# Patient Record
Sex: Male | Born: 1955 | Race: White | Hispanic: No | Smoking: Former smoker
Health system: Southern US, Community
[De-identification: ages and names within clinical notes are randomized; demographics above are authoritative.]

## PROBLEM LIST (undated history)

## (undated) DIAGNOSIS — F1910 Other psychoactive substance abuse, uncomplicated: Secondary | ICD-10-CM

## (undated) DIAGNOSIS — J40 Bronchitis, not specified as acute or chronic: Secondary | ICD-10-CM

## (undated) DIAGNOSIS — C801 Malignant (primary) neoplasm, unspecified: Secondary | ICD-10-CM

## (undated) DIAGNOSIS — J449 Chronic obstructive pulmonary disease, unspecified: Secondary | ICD-10-CM

## (undated) DIAGNOSIS — R911 Solitary pulmonary nodule: Secondary | ICD-10-CM

## (undated) DIAGNOSIS — B009 Herpesviral infection, unspecified: Secondary | ICD-10-CM

## (undated) HISTORY — DX: Solitary pulmonary nodule: R91.1

## (undated) HISTORY — DX: Other psychoactive substance abuse, uncomplicated: F19.10

## (undated) HISTORY — DX: Bronchitis, not specified as acute or chronic: J40

## (undated) NOTE — *Deleted (*Deleted)
Mr. Bouldin presents for follow up of radiation completed 10/16/2018 to his head, neck, and larynx  Pain issues, if any: *** Using a feeding tube?: *** Weight changes, if any: *** Swallowing issues, if any: *** Smoking or chewing tobacco? *** Using fluoride trays daily? N/A Last ENT visit was on: 12/05/2019 Saw Dr. Newman Pies:   Scheduled to see him again in January 2022  Other notable issues, if any: ***  *vitals*

---

## 1997-09-30 ENCOUNTER — Emergency Department (HOSPITAL_COMMUNITY): Admission: EM | Admit: 1997-09-30 | Discharge: 1997-09-30 | Payer: Self-pay | Admitting: Emergency Medicine

## 1998-03-01 ENCOUNTER — Emergency Department (HOSPITAL_COMMUNITY): Admission: EM | Admit: 1998-03-01 | Discharge: 1998-03-01 | Payer: Self-pay | Admitting: Emergency Medicine

## 2018-03-23 DIAGNOSIS — J40 Bronchitis, not specified as acute or chronic: Secondary | ICD-10-CM

## 2018-03-23 HISTORY — DX: Bronchitis, not specified as acute or chronic: J40

## 2018-05-23 DIAGNOSIS — R911 Solitary pulmonary nodule: Secondary | ICD-10-CM

## 2018-05-23 HISTORY — DX: Solitary pulmonary nodule: R91.1

## 2018-06-14 ENCOUNTER — Ambulatory Visit (INDEPENDENT_AMBULATORY_CARE_PROVIDER_SITE_OTHER): Payer: BLUE CROSS/BLUE SHIELD

## 2018-06-14 ENCOUNTER — Encounter: Payer: Self-pay | Admitting: Family Medicine

## 2018-06-14 ENCOUNTER — Ambulatory Visit (INDEPENDENT_AMBULATORY_CARE_PROVIDER_SITE_OTHER): Payer: BLUE CROSS/BLUE SHIELD | Admitting: Family Medicine

## 2018-06-14 VITALS — BP 112/66 | HR 77 | Temp 97.7°F | Ht 72.0 in | Wt 150.0 lb

## 2018-06-14 DIAGNOSIS — Z13220 Encounter for screening for lipoid disorders: Secondary | ICD-10-CM

## 2018-06-14 DIAGNOSIS — R5383 Other fatigue: Secondary | ICD-10-CM | POA: Diagnosis not present

## 2018-06-14 DIAGNOSIS — Z125 Encounter for screening for malignant neoplasm of prostate: Secondary | ICD-10-CM | POA: Diagnosis not present

## 2018-06-14 DIAGNOSIS — R0600 Dyspnea, unspecified: Secondary | ICD-10-CM

## 2018-06-14 DIAGNOSIS — Z122 Encounter for screening for malignant neoplasm of respiratory organs: Secondary | ICD-10-CM

## 2018-06-14 DIAGNOSIS — J441 Chronic obstructive pulmonary disease with (acute) exacerbation: Secondary | ICD-10-CM | POA: Diagnosis not present

## 2018-06-14 MED ORDER — BETAMETHASONE SOD PHOS & ACET 6 (3-3) MG/ML IJ SUSP
6.0000 mg | Freq: Once | INTRAMUSCULAR | Status: AC
  Administered 2018-06-14: 6 mg via INTRAMUSCULAR

## 2018-06-14 MED ORDER — MOXIFLOXACIN HCL 400 MG PO TABS: 400.0000 mg | ORAL_TABLET | Freq: Every day | ORAL | 0 refills | Status: DC

## 2018-06-14 MED ORDER — VARENICLINE TARTRATE 0.5 MG X 11 & 1 MG X 42 PO MISC: ORAL | 0 refills | Status: DC

## 2018-06-14 NOTE — Progress Notes (Signed)
Subjective:  Patient ID: Ronald Ruiz, male    DOB: Feb 08, 1956  Age: 63 y.o. MRN: [id number]  CC: New Patient (Initial Visit)   HPI Abelardo Seidner presents for new patient visit to establish himself as a patient of this practice.  Of note is that he has recently gone to urgent care for shortness of breath.  He was put on some antibiotics for 2 courses.  Nothing seems to have resolved this problem.  He is still coughing and congested.  He is also wheezing and short of breath.  Patient is a smoker with 75-100-pack-year history.  He has never had a CT for lung cancer prevention.  He is interested in stopping smoking.  Patient admits to a history of use of opiates.  He says he just like him.  He had been on pain meds for about 22 years.  He goes to the methadone clinic at [facility].  He takes 110 mg of the liquid more methadone daily.  Depression screen PHQ 2/9 06/14/2018  Decreased Interest 3  Down, Depressed, Hopeless 2  PHQ - 2 Score 5  Altered sleeping 2  Tired, decreased energy 2  Change in appetite 3  Feeling bad or failure about yourself  0  Trouble concentrating 0  Moving slowly or fidgety/restless 0  Suicidal thoughts 1  PHQ-9 Score 13    History Chisom has a past medical history of Substance abuse ([place]).   He has no past surgical history on file.   His family history includes Alcohol abuse in his father; Cancer in his father; Drug abuse in his brother.He reports that he has been smoking cigarettes. He has a 100.00 pack-year smoking history. He has never used smokeless tobacco. He reports previous alcohol use. He reports previous drug use.    ROS Review of Systems  Constitutional: Positive for activity change (Decreased due to shortness of breath) and fatigue. Negative for chills, diaphoresis and fever.  HENT: Negative.   Eyes: Negative for visual disturbance.  Respiratory: Positive for cough and shortness of breath.   Cardiovascular: Negative for chest pain and leg  swelling.  Gastrointestinal: Negative for abdominal pain, diarrhea, nausea and vomiting.  Genitourinary: Negative for difficulty urinating.  Musculoskeletal: Negative for arthralgias and myalgias.  Skin: Negative for rash.  Neurological: Negative for headaches.  Psychiatric/Behavioral: Negative for sleep disturbance.    Objective:  BP 112/66   Pulse 77   Temp 97.7 F (36.5 C) (Oral)   Ht 6' (1.829 m)   Wt 150 lb (68 kg)   BMI 20.34 kg/m   BP Readings from Last 3 Encounters:  06/14/18 112/66    Wt Readings from Last 3 Encounters:  06/14/18 150 lb (68 kg)     Physical Exam    Assessment & Plan:   Khizar was seen today for new patient (initial visit).  Diagnoses and all orders for this visit:  Dyspnea, unspecified type -     DG Chest 2 View; Future -     betamethasone acetate-betamethasone sodium phosphate (CELESTONE) injection 6 mg -     CBC with Differential/Platelet -     CMP14+EGFR  Screening for cholesterol level -     Lipid panel  Special screening for malignant neoplasm of prostate -     PSA, total and free  Other fatigue -     TSH  Chronic obstructive pulmonary disease with acute exacerbation ([place]) -     Ambulatory referral to Pulmonology  Encounter for screening for malignant neoplasm of  respiratory organs -     CT CHEST LUNG CA SCREEN LOW DOSE W/O CM; Future  Other orders -     moxifloxacin (AVELOX) 400 MG tablet; Take 1 tablet (400 mg total) by mouth daily. Take all of these, for infection -     varenicline (CHANTIX STARTING MONTH PAK) 0.5 MG X 11 & 1 MG X 42 tablet; Use according to package directions       I am having Braxley Balandran "Tim" start on moxifloxacin and varenicline. I am also having him maintain his albuterol, budesonide-formoterol, sulfamethoxazole-trimethoprim, and methadone. We administered betamethasone acetate-betamethasone sodium phosphate.  Allergies as of 06/14/2018   No Known Allergies     Medication List        Accurate as of June 14, 2018 11:59 PM. Always use your most recent med list.        albuterol 108 (90 Base) MCG/ACT inhaler Commonly known as:  PROVENTIL HFA;VENTOLIN HFA Inhale into the lungs.   methadone 10 MG/ML solution Commonly known as:  DOLOPHINE Take 110 mg by mouth daily.   moxifloxacin 400 MG tablet Commonly known as:  AVELOX Take 1 tablet (400 mg total) by mouth daily. Take all of these, for infection   sulfamethoxazole-trimethoprim 800-160 MG tablet Commonly known as:  BACTRIM DS,SEPTRA DS   SYMBICORT 80-4.5 MCG/ACT inhaler Generic drug:  budesonide-formoterol Inhale into the lungs.   varenicline 0.5 MG X 11 & 1 MG X 42 tablet Commonly known as:  CHANTIX STARTING MONTH PAK Use according to package directions        Follow-up: Return in about 2 weeks (around 06/28/2018).  [name], M.D.

## 2018-06-15 LAB — CBC WITH DIFFERENTIAL/PLATELET
Basophils Absolute: 0.1 10*3/uL (ref 0.0–0.2)
Basos: 1 %
EOS (ABSOLUTE): 0.4 10*3/uL (ref 0.0–0.4)
Eos: 4 %
HEMOGLOBIN: 14.5 g/dL (ref 13.0–17.7)
Hematocrit: 42.6 % (ref 37.5–51.0)
Immature Grans (Abs): 0 10*3/uL (ref 0.0–0.1)
Immature Granulocytes: 0 %
Lymphocytes Absolute: 2.6 10*3/uL (ref 0.7–3.1)
Lymphs: 29 %
MCH: 31.7 pg (ref 26.6–33.0)
MCHC: 34 g/dL (ref 31.5–35.7)
MCV: 93 fL (ref 79–97)
Monocytes Absolute: 0.7 10*3/uL (ref 0.1–0.9)
Monocytes: 8 %
Neutrophils Absolute: 5 10*3/uL (ref 1.4–7.0)
Neutrophils: 58 %
Platelets: 188 10*3/uL (ref 150–450)
RBC: 4.58 x10E6/uL (ref 4.14–5.80)
RDW: 13.6 % (ref 11.6–15.4)
WBC: 8.8 10*3/uL (ref 3.4–10.8)

## 2018-06-15 LAB — CMP14+EGFR
ALT: 16 IU/L (ref 0–44)
AST: 20 IU/L (ref 0–40)
Albumin/Globulin Ratio: 1.4 (ref 1.2–2.2)
Albumin: 4.2 g/dL (ref 3.8–4.8)
Alkaline Phosphatase: 67 IU/L (ref 39–117)
BILIRUBIN TOTAL: 0.2 mg/dL (ref 0.0–1.2)
BUN/Creatinine Ratio: 20 (ref 10–24)
BUN: 22 mg/dL (ref 8–27)
CHLORIDE: 97 mmol/L (ref 96–106)
CO2: 24 mmol/L (ref 20–29)
Calcium: 9.5 mg/dL (ref 8.6–10.2)
Creatinine, Ser: 1.11 mg/dL (ref 0.76–1.27)
GFR calc Af Amer: 82 mL/min/{1.73_m2} (ref >59)
GFR calc non Af Amer: 71 mL/min/{1.73_m2} (ref >59)
Globulin, Total: 3.1 g/dL (ref 1.5–4.5)
Glucose: 86 mg/dL (ref 65–99)
POTASSIUM: 4.7 mmol/L (ref 3.5–5.2)
Sodium: 139 mmol/L (ref 134–144)
Total Protein: 7.3 g/dL (ref 6.0–8.5)

## 2018-06-15 LAB — LIPID PANEL
Chol/HDL Ratio: 3.8 ratio (ref 0.0–5.0)
Cholesterol, Total: 173 mg/dL (ref 100–199)
HDL: 46 mg/dL (ref >39)
LDL Calculated: 102 mg/dL — ABNORMAL HIGH (ref 0–99)
Triglycerides: 123 mg/dL (ref 0–149)
VLDL Cholesterol Cal: 25 mg/dL (ref 5–40)

## 2018-06-15 LAB — TSH: TSH: 1.68 u[IU]/mL (ref 0.450–4.500)

## 2018-06-15 LAB — PSA, TOTAL AND FREE
PSA, Free Pct: 40 %
PSA, Free: 0.04 ng/mL
Prostate Specific Ag, Serum: 0.1 ng/mL (ref 0.0–4.0)

## 2018-06-18 NOTE — Progress Notes (Signed)
Hello Ronald Ruiz,  Your lab result is normal.Some minor variations that are not significant are commonly marked abnormal, but do not represent any medical problem for you.  Best regards, [name], M.D.

## 2018-06-22 ENCOUNTER — Encounter: Payer: Self-pay | Admitting: Family Medicine

## 2018-06-27 ENCOUNTER — Ambulatory Visit: Payer: BLUE CROSS/BLUE SHIELD | Admitting: Family Medicine

## 2018-07-02 ENCOUNTER — Other Ambulatory Visit: Payer: Self-pay | Admitting: Family Medicine

## 2018-07-02 MED ORDER — BUDESONIDE-FORMOTEROL FUMARATE 80-4.5 MCG/ACT IN AERO: 2.0000 | INHALATION_SPRAY | Freq: Every day | RESPIRATORY_TRACT | 0 refills | Status: DC

## 2018-07-02 NOTE — Telephone Encounter (Signed)
Refill sent to pharmacy.   

## 2018-07-03 ENCOUNTER — Ambulatory Visit (HOSPITAL_COMMUNITY)
Admission: RE | Admit: 2018-07-03 | Discharge: 2018-07-03 | Disposition: A | Discharge status: Home / Self Care | Payer: BLUE CROSS/BLUE SHIELD | Source: Ambulatory Visit | Attending: Family Medicine | Admitting: Family Medicine

## 2018-07-03 DIAGNOSIS — Z122 Encounter for screening for malignant neoplasm of respiratory organs: Secondary | ICD-10-CM | POA: Diagnosis present

## 2018-07-04 ENCOUNTER — Ambulatory Visit: Payer: BLUE CROSS/BLUE SHIELD | Admitting: Family Medicine

## 2018-07-06 ENCOUNTER — Institutional Professional Consult (permissible substitution): Payer: Self-pay | Admitting: Pulmonary Disease

## 2018-07-06 ENCOUNTER — Telehealth: Payer: Self-pay

## 2018-07-06 ENCOUNTER — Encounter: Payer: Self-pay | Admitting: Pulmonary Disease

## 2018-07-06 ENCOUNTER — Ambulatory Visit (INDEPENDENT_AMBULATORY_CARE_PROVIDER_SITE_OTHER): Payer: BLUE CROSS/BLUE SHIELD | Admitting: Pulmonary Disease

## 2018-07-06 DIAGNOSIS — J441 Chronic obstructive pulmonary disease with (acute) exacerbation: Secondary | ICD-10-CM | POA: Diagnosis not present

## 2018-07-06 LAB — CBC WITH DIFFERENTIAL/PLATELET
Basophils Absolute: 0 10*3/uL (ref 0.0–0.1)
Basophils Relative: 0.6 % (ref 0.0–3.0)
EOS PCT: 3.5 % (ref 0.0–5.0)
Eosinophils Absolute: 0.2 10*3/uL (ref 0.0–0.7)
HCT: 40.8 % (ref 39.0–52.0)
HEMOGLOBIN: 13.6 g/dL (ref 13.0–17.0)
Lymphocytes Relative: 34.5 % (ref 12.0–46.0)
Lymphs Abs: 2.3 10*3/uL (ref 0.7–4.0)
MCHC: 33.4 g/dL (ref 30.0–36.0)
MCV: 95.8 fl (ref 78.0–100.0)
MONOS PCT: 8.3 % (ref 3.0–12.0)
Monocytes Absolute: 0.6 10*3/uL (ref 0.1–1.0)
Neutro Abs: 3.6 10*3/uL (ref 1.4–7.7)
Neutrophils Relative %: 53.1 % (ref 43.0–77.0)
Platelets: 180 10*3/uL (ref 150.0–400.0)
RBC: 4.26 Mil/uL (ref 4.22–5.81)
RDW: 14.7 % (ref 11.5–15.5)
WBC: 6.7 10*3/uL (ref 4.0–10.5)

## 2018-07-06 LAB — NITRIC OXIDE: FeNO level (ppb): 11

## 2018-07-06 MED ORDER — TIOTROPIUM BROMIDE MONOHYDRATE 2.5 MCG/ACT IN AERS: 2.0000 | INHALATION_SPRAY | Freq: Every day | RESPIRATORY_TRACT | 2 refills | Status: DC

## 2018-07-06 NOTE — Patient Instructions (Signed)
Continue the Symbicort and albuterol We will add Spiriva 2.5 Use Mucinex over-the-counter for chest congestion We will schedule you for pulmonary function test Check CBC differential, IgE, alpha-1 antitrypsin levels and phenotype  Schedule PET scan Follow-up in 2 weeks.

## 2018-07-06 NOTE — Telephone Encounter (Signed)
Spoke with woman in CT-she said the person who does that is out until 07/09/18. Will leave message to call to iturn CT scan into Super D.  She said the machine does not have it on there but she saves the scans.

## 2018-07-06 NOTE — Progress Notes (Signed)
Ronald Ruiz    [id number]    Sep 28, 1955  Primary Care Physician:Stacks, Cletus Gash, MD  Referring Physician: [name], MD [place], [place] [id number]  Chief complaint: Consult for bronchitis, lung nodule  HPI: 63 year old active heavy smoker with history of substance abuse, on methadone. Has recurrent attacks of bronchitis since November 2019.  Treated with multiple rounds of antibiotics and prednisone. Still has chronic cough with white mucus, dyspnea on exertion, chest congestion with wheezing.  Denies any fevers, chills He had been started on Symbicort a few months ago but cannot tell if it is helping  CT scan obtained earlier this week shows evidence of emphysema, fine micro nodularity and 2 lower nodules  Pets: Has a cat, no dogs, birds, farm animals Occupation: Works as a Insurance claims handler for [employer]: No known exposures, no mold, hot tub, Jacuzzi Smoking history: 40-pack-year smoker.  Continues to smoke a pack a day Travel history: No significant travel history Relevant family history: No significant family history of lung disease  Outpatient Encounter Medications as of 07/06/2018  Medication Sig  . budesonide-formoterol (SYMBICORT) 80-4.5 MCG/ACT inhaler Inhale 2 puffs into the lungs daily for 30 days.  . methadone (DOLOPHINE) 10 MG/ML solution Take 110 mg by mouth daily.  . varenicline (CHANTIX STARTING MONTH PAK) 0.5 MG X 11 & 1 MG X 42 tablet Use according to package directions  . [DISCONTINUED] moxifloxacin (AVELOX) 400 MG tablet Take 1 tablet (400 mg total) by mouth daily. Take all of these, for infection  . [DISCONTINUED] sulfamethoxazole-trimethoprim (BACTRIM DS,SEPTRA DS) 800-160 MG tablet   . albuterol (PROVENTIL HFA;VENTOLIN HFA) 108 (90 Base) MCG/ACT inhaler Inhale into the lungs.   No facility-administered encounter medications on file as of 07/06/2018.     Allergies as of 07/06/2018  . (No Known Allergies)    Past  Medical History:  Diagnosis Date  . Bronchitis 03/2018  . Lung nodule 2020  . Substance abuse ([place])    opiate addiction, been on methadone for 11 years    History reviewed. No pertinent surgical history.  Family History  Problem Relation Age of Onset  . Alcohol abuse Father   . Cancer Father   . Cirrhosis Father   . Drug abuse Brother     Social History   Socioeconomic History  . Marital status: Widowed    Spouse name: Not on file  . Number of children: Not on file  . Years of education: Not on file  . Highest education level: Not on file  Occupational History  . Not on file  Social Needs  . Financial resource strain: Not on file  . Food insecurity:    Worry: Not on file    Inability: Not on file  . Transportation needs:    Medical: Not on file    Non-medical: Not on file  Tobacco Use  . Smoking status: Current Every Day Smoker    Packs/day: 2.00    Years: 50.00    Pack years: 100.00    Types: Cigarettes  . Smokeless tobacco: Never Used  . Tobacco comment: 07/06/18 at 10 cigs per day  Substance and Sexual Activity  . Alcohol use: Not Currently  . Drug use: Not Currently  . Sexual activity: Not Currently    Birth control/protection: None  Lifestyle  . Physical activity:    Days per week: Not on file    Minutes per session: Not on file  . Stress: Not  on file  Relationships  . Social connections:    Talks on phone: Not on file    Gets together: Not on file    Attends religious service: Not on file    Active member of club or organization: Not on file    Attends meetings of clubs or organizations: Not on file    Relationship status: Not on file  . Intimate partner violence:    Fear of current or ex partner: Not on file    Emotionally abused: Not on file    Physically abused: Not on file    Forced sexual activity: Not on file  Other Topics Concern  . Not on file  Social History Narrative  . Not on file    Review of systems: Review of Systems    Constitutional: Negative for fever and chills.  HENT: Negative.   Eyes: Negative for blurred vision.  Respiratory: as per HPI  Cardiovascular: Negative for chest pain and palpitations.  Gastrointestinal: Negative for vomiting, diarrhea, blood per rectum. Genitourinary: Negative for dysuria, urgency, frequency and hematuria.  Musculoskeletal: Negative for myalgias, back pain and joint pain.  Skin: Negative for itching and rash.  Neurological: Negative for dizziness, tremors, focal weakness, seizures and loss of consciousness.  Endo/Heme/Allergies: Negative for environmental allergies.  Psychiatric/Behavioral: Negative for depression, suicidal ideas and hallucinations.  All other systems reviewed and are negative.  Physical Exam: Blood pressure 106/60, pulse 72, height 6' (1.829 m), weight 153 lb 3.2 oz (69.5 kg), SpO2 98 %. Gen:      No acute distress HEENT:  EOMI, sclera anicteric Neck:     No masses; no thyromegaly Lungs:    Clear to auscultation bilaterally; normal respiratory effort CV:         Regular rate and rhythm; no murmurs Abd:      + bowel sounds; soft, non-tender; no palpable masses, no distension Ext:    No edema; adequate peripheral perfusion Skin:      Warm and dry; no rash Neuro: alert and oriented x 3 Psych: normal mood and affect  Data Reviewed: Imaging: CT chest 07/03/2018-mild centrilobular emphysema, fine nodularity in the lower lobes, middle lobe and lingula.  1.1 irregular elongated nodule in the left lower lobe, 1 cm pulmonary nodule in the right middle lobe.  I have reviewed the images personally.  FENO 07/06/2018-11  Assessment:  Chronic bronchitis, emphysema Likely has COPD based on his presentation and smoking history He is currently on Symbicort and albuterol.  I will add Spiriva as he continues to be symptomatic Schedule pulmonary function tests Check CBC differential, IgE, alpha-1 antitrypsin levels and phenotype for baseline evaluation  Abnormal  CT scan Has fine micronodularity suggestive of smoking-related changes in addition to 2 lung nodules measuring 1 cm The right middle lobe nodule is concerning for malignancy given its appearance.  Its presence just above the diaphragm will make it difficult to approach with either CT-guided biopsy or navigational biopsy.  We will call for super D CT  We will get a PET scan for further evaluation.  Based on PFTs we may need to consider going for surgical resection I will discuss the case with my colleagues regarding the best approach for diagnosis.  Plan/Recommendations: - Continue Symbicort, albuterol - Start Spiriva - Check CBC, IgE, alpha-1 antitrypsin - PET scan  Marshell Garfinkel MD [facility] 07/06/2018, 10:19 AM  CC: [name], MD

## 2018-07-12 MED ORDER — TIOTROPIUM BROMIDE MONOHYDRATE 2.5 MCG/ACT IN AERS: 2.0000 | INHALATION_SPRAY | Freq: Every day | RESPIRATORY_TRACT | 2 refills | Status: DC

## 2018-07-12 NOTE — Addendum Note (Signed)
Addended by: Karmen Stabs on: 07/12/2018 11:42 AM   Modules accepted: Orders

## 2018-07-13 LAB — ALPHA-1 ANTITRYPSIN PHENOTYPE: A-1 Antitrypsin, Ser: 199 mg/dL (ref 83–199)

## 2018-07-13 LAB — IGE: IgE (Immunoglobulin E), Serum: 106 kU/L (ref <=114)

## 2018-07-17 ENCOUNTER — Other Ambulatory Visit: Payer: Self-pay | Admitting: Pulmonary Disease

## 2018-07-17 ENCOUNTER — Ambulatory Visit (HOSPITAL_COMMUNITY)
Admission: RE | Admit: 2018-07-17 | Discharge: 2018-07-17 | Disposition: A | Discharge status: Home / Self Care | Payer: BLUE CROSS/BLUE SHIELD | Source: Ambulatory Visit | Attending: Pulmonary Disease | Admitting: Pulmonary Disease

## 2018-07-17 DIAGNOSIS — R911 Solitary pulmonary nodule: Secondary | ICD-10-CM

## 2018-07-17 DIAGNOSIS — J441 Chronic obstructive pulmonary disease with (acute) exacerbation: Secondary | ICD-10-CM | POA: Diagnosis present

## 2018-07-17 DIAGNOSIS — J383 Other diseases of vocal cords: Secondary | ICD-10-CM

## 2018-07-17 LAB — GLUCOSE, CAPILLARY: Glucose-Capillary: 91 mg/dL (ref 70–99)

## 2018-07-17 MED ORDER — FLUDEOXYGLUCOSE F - 18 (FDG) INJECTION
7.7200 | Freq: Once | INTRAVENOUS | Status: AC | PRN
  Administered 2018-07-17: 7.72 via INTRAVENOUS

## 2018-07-20 ENCOUNTER — Other Ambulatory Visit: Payer: Self-pay | Admitting: Otolaryngology

## 2018-07-21 ENCOUNTER — Inpatient Hospital Stay (HOSPITAL_COMMUNITY)
Admission: EM | Admit: 2018-07-21 | Discharge: 2018-08-03 | DRG: 011 | Disposition: A | Discharge status: Home Health | Payer: BLUE CROSS/BLUE SHIELD | Source: Other Acute Inpatient Hospital | Attending: Internal Medicine | Admitting: Internal Medicine

## 2018-07-21 ENCOUNTER — Encounter (HOSPITAL_COMMUNITY)
Admission: EM | Disposition: A | Discharge status: Home Health | Payer: Self-pay | Source: Other Acute Inpatient Hospital | Attending: Internal Medicine

## 2018-07-21 ENCOUNTER — Emergency Department (HOSPITAL_COMMUNITY): Payer: BLUE CROSS/BLUE SHIELD | Admitting: Certified Registered"

## 2018-07-21 ENCOUNTER — Encounter (HOSPITAL_COMMUNITY): Payer: Self-pay

## 2018-07-21 ENCOUNTER — Other Ambulatory Visit: Payer: Self-pay

## 2018-07-21 ENCOUNTER — Emergency Department (HOSPITAL_COMMUNITY): Payer: BLUE CROSS/BLUE SHIELD

## 2018-07-21 DIAGNOSIS — J387 Other diseases of larynx: Secondary | ICD-10-CM | POA: Diagnosis not present

## 2018-07-21 DIAGNOSIS — R1319 Other dysphagia: Secondary | ICD-10-CM | POA: Diagnosis not present

## 2018-07-21 DIAGNOSIS — Z809 Family history of malignant neoplasm, unspecified: Secondary | ICD-10-CM

## 2018-07-21 DIAGNOSIS — F1120 Opioid dependence, uncomplicated: Secondary | ICD-10-CM | POA: Diagnosis present

## 2018-07-21 DIAGNOSIS — I700 Atherosclerosis of aorta: Secondary | ICD-10-CM | POA: Diagnosis not present

## 2018-07-21 DIAGNOSIS — Z681 Body mass index (BMI) 19 or less, adult: Secondary | ICD-10-CM

## 2018-07-21 DIAGNOSIS — R402252 Coma scale, best verbal response, oriented, at arrival to emergency department: Secondary | ICD-10-CM | POA: Diagnosis present

## 2018-07-21 DIAGNOSIS — R402362 Coma scale, best motor response, obeys commands, at arrival to emergency department: Secondary | ICD-10-CM | POA: Diagnosis present

## 2018-07-21 DIAGNOSIS — Z813 Family history of other psychoactive substance abuse and dependence: Secondary | ICD-10-CM

## 2018-07-21 DIAGNOSIS — E43 Unspecified severe protein-calorie malnutrition: Secondary | ICD-10-CM | POA: Diagnosis present

## 2018-07-21 DIAGNOSIS — Z811 Family history of alcohol abuse and dependence: Secondary | ICD-10-CM | POA: Diagnosis not present

## 2018-07-21 DIAGNOSIS — R1310 Dysphagia, unspecified: Secondary | ICD-10-CM | POA: Diagnosis present

## 2018-07-21 DIAGNOSIS — J439 Emphysema, unspecified: Secondary | ICD-10-CM | POA: Diagnosis not present

## 2018-07-21 DIAGNOSIS — F17200 Nicotine dependence, unspecified, uncomplicated: Secondary | ICD-10-CM | POA: Diagnosis not present

## 2018-07-21 DIAGNOSIS — C320 Malignant neoplasm of glottis: Principal | ICD-10-CM | POA: Diagnosis present

## 2018-07-21 DIAGNOSIS — J449 Chronic obstructive pulmonary disease, unspecified: Secondary | ICD-10-CM | POA: Diagnosis present

## 2018-07-21 DIAGNOSIS — R402142 Coma scale, eyes open, spontaneous, at arrival to emergency department: Secondary | ICD-10-CM | POA: Diagnosis present

## 2018-07-21 DIAGNOSIS — Z7951 Long term (current) use of inhaled steroids: Secondary | ICD-10-CM

## 2018-07-21 DIAGNOSIS — F1910 Other psychoactive substance abuse, uncomplicated: Secondary | ICD-10-CM | POA: Diagnosis not present

## 2018-07-21 DIAGNOSIS — R0600 Dyspnea, unspecified: Secondary | ICD-10-CM

## 2018-07-21 DIAGNOSIS — J385 Laryngeal spasm: Secondary | ICD-10-CM | POA: Diagnosis present

## 2018-07-21 DIAGNOSIS — E162 Hypoglycemia, unspecified: Secondary | ICD-10-CM | POA: Diagnosis not present

## 2018-07-21 DIAGNOSIS — F1920 Other psychoactive substance dependence, uncomplicated: Secondary | ICD-10-CM | POA: Diagnosis not present

## 2018-07-21 DIAGNOSIS — Z930 Tracheostomy status: Secondary | ICD-10-CM | POA: Diagnosis not present

## 2018-07-21 DIAGNOSIS — J383 Other diseases of vocal cords: Secondary | ICD-10-CM | POA: Diagnosis not present

## 2018-07-21 DIAGNOSIS — D696 Thrombocytopenia, unspecified: Secondary | ICD-10-CM | POA: Diagnosis present

## 2018-07-21 DIAGNOSIS — Q315 Congenital laryngomalacia: Secondary | ICD-10-CM

## 2018-07-21 DIAGNOSIS — F17210 Nicotine dependence, cigarettes, uncomplicated: Secondary | ICD-10-CM | POA: Diagnosis present

## 2018-07-21 DIAGNOSIS — Z931 Gastrostomy status: Secondary | ICD-10-CM

## 2018-07-21 DIAGNOSIS — Z934 Other artificial openings of gastrointestinal tract status: Secondary | ICD-10-CM

## 2018-07-21 DIAGNOSIS — D649 Anemia, unspecified: Secondary | ICD-10-CM | POA: Diagnosis not present

## 2018-07-21 DIAGNOSIS — J9601 Acute respiratory failure with hypoxia: Secondary | ICD-10-CM

## 2018-07-21 HISTORY — PX: DIRECT LARYNGOSCOPY: SHX5326

## 2018-07-21 HISTORY — DX: Herpesviral infection, unspecified: B00.9

## 2018-07-21 LAB — CBC WITH DIFFERENTIAL/PLATELET
Abs Immature Granulocytes: 0.03 10*3/uL (ref 0.00–0.07)
Basophils Absolute: 0 10*3/uL (ref 0.0–0.1)
Basophils Relative: 0 %
EOS ABS: 0 10*3/uL (ref 0.0–0.5)
Eosinophils Relative: 0 %
HEMATOCRIT: 43 % (ref 39.0–52.0)
Hemoglobin: 13.5 g/dL (ref 13.0–17.0)
Immature Granulocytes: 0 %
LYMPHS ABS: 1 10*3/uL (ref 0.7–4.0)
Lymphocytes Relative: 9 %
MCH: 30.5 pg (ref 26.0–34.0)
MCHC: 31.4 g/dL (ref 30.0–36.0)
MCV: 97.3 fL (ref 80.0–100.0)
Monocytes Absolute: 0.3 10*3/uL (ref 0.1–1.0)
Monocytes Relative: 2 %
Neutro Abs: 9.4 10*3/uL — ABNORMAL HIGH (ref 1.7–7.7)
Neutrophils Relative %: 89 %
Platelets: 144 10*3/uL — ABNORMAL LOW (ref 150–400)
RBC: 4.42 MIL/uL (ref 4.22–5.81)
RDW: 13.7 % (ref 11.5–15.5)
WBC: 10.7 10*3/uL — ABNORMAL HIGH (ref 4.0–10.5)
nRBC: 0 % (ref 0.0–0.2)

## 2018-07-21 LAB — BASIC METABOLIC PANEL
Anion gap: 7 (ref 5–15)
BUN: 15 mg/dL (ref 8–23)
CO2: 27 mmol/L (ref 22–32)
Calcium: 9.4 mg/dL (ref 8.9–10.3)
Chloride: 103 mmol/L (ref 98–111)
Creatinine, Ser: 0.9 mg/dL (ref 0.61–1.24)
GFR calc Af Amer: >60 mL/min (ref >60)
GFR calc non Af Amer: >60 mL/min (ref >60)
Glucose, Bld: 116 mg/dL — ABNORMAL HIGH (ref 70–99)
Potassium: 4.1 mmol/L (ref 3.5–5.1)
Sodium: 137 mmol/L (ref 135–145)

## 2018-07-21 LAB — PROTIME-INR
INR: 1 (ref 0.8–1.2)
Prothrombin Time: 13.1 seconds (ref 11.4–15.2)

## 2018-07-21 LAB — GLUCOSE, CAPILLARY: Glucose-Capillary: 139 mg/dL — ABNORMAL HIGH (ref 70–99)

## 2018-07-21 LAB — TYPE AND SCREEN
ABO/RH(D): O POS
Antibody Screen: NEGATIVE

## 2018-07-21 LAB — ABO/RH: ABO/RH(D): O POS

## 2018-07-21 SURGERY — LARYNGOSCOPY, DIRECT
Anesthesia: General | Site: Throat

## 2018-07-21 MED ORDER — ARFORMOTEROL TARTRATE 15 MCG/2ML IN NEBU
15.0000 ug | INHALATION_SOLUTION | Freq: Two times a day (BID) | RESPIRATORY_TRACT | Status: DC
  Administered 2018-07-22 – 2018-08-03 (×25): 15 ug via RESPIRATORY_TRACT
  Filled 2018-07-21 (×26): qty 2

## 2018-07-21 MED ORDER — SODIUM CHLORIDE 0.9 % IV SOLN: Freq: Once | INTRAVENOUS | Status: DC

## 2018-07-21 MED ORDER — LACTATED RINGERS IV SOLN
INTRAVENOUS | Status: DC | PRN
  Administered 2018-07-21: 21:00:00 via INTRAVENOUS

## 2018-07-21 MED ORDER — EPINEPHRINE PF 1 MG/ML IJ SOLN
INTRAMUSCULAR | Status: AC
  Filled 2018-07-21: qty 1

## 2018-07-21 MED ORDER — MIDAZOLAM HCL 5 MG/5ML IJ SOLN
INTRAMUSCULAR | Status: DC | PRN
  Administered 2018-07-21: 2 mg via INTRAVENOUS

## 2018-07-21 MED ORDER — LIDOCAINE-EPINEPHRINE 1 %-1:100000 IJ SOLN
INTRAMUSCULAR | Status: DC | PRN
  Administered 2018-07-21: 5 mL

## 2018-07-21 MED ORDER — LIDOCAINE 2% (20 MG/ML) 5 ML SYRINGE
INTRAMUSCULAR | Status: AC
  Filled 2018-07-21: qty 5

## 2018-07-21 MED ORDER — BUPIVACAINE HCL (PF) 0.25 % IJ SOLN
INTRAMUSCULAR | Status: AC
  Filled 2018-07-21: qty 30

## 2018-07-21 MED ORDER — SUFENTANIL CITRATE 50 MCG/ML IV SOLN
INTRAVENOUS | Status: DC | PRN
  Administered 2018-07-21: 10 ug via INTRAVENOUS
  Administered 2018-07-21: 20 ug via INTRAVENOUS

## 2018-07-21 MED ORDER — ACETAMINOPHEN 325 MG PO TABS
650.0000 mg | ORAL_TABLET | ORAL | Status: DC | PRN
  Administered 2018-07-24: 650 mg via ORAL
  Filled 2018-07-21: qty 2

## 2018-07-21 MED ORDER — ENOXAPARIN SODIUM 40 MG/0.4ML ~~LOC~~ SOLN
40.0000 mg | SUBCUTANEOUS | Status: DC
  Administered 2018-07-22 – 2018-07-31 (×9): 40 mg via SUBCUTANEOUS
  Filled 2018-07-21 (×10): qty 0.4

## 2018-07-21 MED ORDER — FAMOTIDINE 20 MG PO TABS: 20.0000 mg | ORAL_TABLET | Freq: Two times a day (BID) | ORAL | Status: DC

## 2018-07-21 MED ORDER — ONDANSETRON HCL 4 MG/2ML IJ SOLN
4.0000 mg | Freq: Four times a day (QID) | INTRAMUSCULAR | Status: DC | PRN
  Administered 2018-07-27: 4 mg via INTRAVENOUS
  Filled 2018-07-21: qty 2

## 2018-07-21 MED ORDER — PROPOFOL 10 MG/ML IV BOLUS
INTRAVENOUS | Status: DC | PRN
  Administered 2018-07-21: 180 mg via INTRAVENOUS

## 2018-07-21 MED ORDER — SODIUM CHLORIDE (PF) 0.9 % IJ SOLN
INTRAMUSCULAR | Status: AC
  Filled 2018-07-21: qty 10

## 2018-07-21 MED ORDER — SUCCINYLCHOLINE CHLORIDE 200 MG/10ML IV SOSY
PREFILLED_SYRINGE | INTRAVENOUS | Status: AC
  Filled 2018-07-21: qty 10

## 2018-07-21 MED ORDER — ONDANSETRON HCL 4 MG/2ML IJ SOLN
INTRAMUSCULAR | Status: DC | PRN
  Administered 2018-07-21: 4 mg via INTRAVENOUS

## 2018-07-21 MED ORDER — OXYMETAZOLINE HCL 0.05 % NA SOLN
NASAL | Status: DC | PRN
  Administered 2018-07-21: 1

## 2018-07-21 MED ORDER — IPRATROPIUM BROMIDE 0.02 % IN SOLN
0.5000 mg | Freq: Three times a day (TID) | RESPIRATORY_TRACT | Status: DC
  Administered 2018-07-21: 0.5 mg via RESPIRATORY_TRACT
  Filled 2018-07-21: qty 2.5

## 2018-07-21 MED ORDER — MIDAZOLAM HCL 2 MG/2ML IJ SOLN
INTRAMUSCULAR | Status: AC
  Filled 2018-07-21: qty 2

## 2018-07-21 MED ORDER — SUCCINYLCHOLINE CHLORIDE 200 MG/10ML IV SOSY
PREFILLED_SYRINGE | INTRAVENOUS | Status: DC | PRN
  Administered 2018-07-21: 120 mg via INTRAVENOUS

## 2018-07-21 MED ORDER — 0.9 % SODIUM CHLORIDE (POUR BTL) OPTIME
TOPICAL | Status: DC | PRN
  Administered 2018-07-21: 1000 mL

## 2018-07-21 MED ORDER — DEXAMETHASONE SODIUM PHOSPHATE 10 MG/ML IJ SOLN
INTRAMUSCULAR | Status: DC | PRN
  Administered 2018-07-21: 10 mg via INTRAVENOUS

## 2018-07-21 MED ORDER — BUDESONIDE 0.25 MG/2ML IN SUSP
0.2500 mg | Freq: Two times a day (BID) | RESPIRATORY_TRACT | Status: DC
  Administered 2018-07-22 – 2018-08-03 (×25): 0.25 mg via RESPIRATORY_TRACT
  Filled 2018-07-21 (×26): qty 2

## 2018-07-21 MED ORDER — DEXAMETHASONE SODIUM PHOSPHATE 10 MG/ML IJ SOLN
INTRAMUSCULAR | Status: AC
  Filled 2018-07-21: qty 1

## 2018-07-21 MED ORDER — PROPOFOL 10 MG/ML IV BOLUS
INTRAVENOUS | Status: AC
  Filled 2018-07-21: qty 20

## 2018-07-21 MED ORDER — SUFENTANIL CITRATE 50 MCG/ML IV SOLN
INTRAVENOUS | Status: AC
  Filled 2018-07-21: qty 1

## 2018-07-21 MED ORDER — LIDOCAINE-EPINEPHRINE 1 %-1:100000 IJ SOLN
INTRAMUSCULAR | Status: AC
  Filled 2018-07-21: qty 1

## 2018-07-21 MED ORDER — OXYMETAZOLINE HCL 0.05 % NA SOLN
NASAL | Status: AC
  Filled 2018-07-21: qty 30

## 2018-07-21 MED ORDER — ALBUTEROL SULFATE (2.5 MG/3ML) 0.083% IN NEBU: 2.5000 mg | INHALATION_SOLUTION | RESPIRATORY_TRACT | Status: DC | PRN

## 2018-07-21 MED ORDER — BUPIVACAINE-EPINEPHRINE (PF) 0.5% -1:200000 IJ SOLN
INTRAMUSCULAR | Status: AC
  Filled 2018-07-21: qty 30

## 2018-07-21 MED ORDER — LIDOCAINE 2% (20 MG/ML) 5 ML SYRINGE
INTRAMUSCULAR | Status: DC | PRN
  Administered 2018-07-21: 100 mg via INTRAVENOUS

## 2018-07-21 MED ORDER — CEFAZOLIN SODIUM-DEXTROSE 2-3 GM-%(50ML) IV SOLR
INTRAVENOUS | Status: DC | PRN
  Administered 2018-07-21: 2 g via INTRAVENOUS

## 2018-07-21 MED ORDER — ONDANSETRON HCL 4 MG/2ML IJ SOLN
INTRAMUSCULAR | Status: AC
  Filled 2018-07-21: qty 2

## 2018-07-21 SURGICAL SUPPLY — 29 items
CANISTER SUCT 3000ML PPV (MISCELLANEOUS) ×3 IMPLANT
CONT SPEC 4OZ CLIKSEAL STRL BL (MISCELLANEOUS) ×2 IMPLANT
COVER WAND RF STERILE (DRAPES) ×3 IMPLANT
DRAPE HALF SHEET 40X57 (DRAPES) ×5 IMPLANT
ELECT COATED BLADE 2.86 ST (ELECTRODE) ×2 IMPLANT
GAUZE SPONGE 4X4 12PLY STRL LF (GAUZE/BANDAGES/DRESSINGS) ×3 IMPLANT
GLOVE BIO SURGEON STRL SZ7.5 (GLOVE) ×3 IMPLANT
GLOVE BIOGEL PI IND STRL 7.0 (GLOVE) IMPLANT
GLOVE BIOGEL PI INDICATOR 7.0 (GLOVE) ×2
GLOVE SURG SS PI 7.0 STRL IVOR (GLOVE) ×2 IMPLANT
GOWN STRL REUS W/ TWL LRG LVL3 (GOWN DISPOSABLE) ×2 IMPLANT
GOWN STRL REUS W/TWL LRG LVL3 (GOWN DISPOSABLE) ×6
GUARD TEETH (MISCELLANEOUS) ×3 IMPLANT
HOLDER TRACH TUBE VELCRO 19.5 (MISCELLANEOUS) ×2 IMPLANT
KIT BASIN OR (CUSTOM PROCEDURE TRAY) ×3 IMPLANT
KIT TURNOVER KIT B (KITS) ×3 IMPLANT
NDL HYPO 25GX1X1/2 BEV (NEEDLE) IMPLANT
NEEDLE HYPO 25GX1X1/2 BEV (NEEDLE) ×3 IMPLANT
NS IRRIG 1000ML POUR BTL (IV SOLUTION) ×3 IMPLANT
PACK ENT DAY SURGERY (CUSTOM PROCEDURE TRAY) ×2 IMPLANT
PAD ARMBOARD 7.5X6 YLW CONV (MISCELLANEOUS) ×6 IMPLANT
PATTIES SURGICAL .5 X1 (DISPOSABLE) ×4 IMPLANT
PENCIL BUTTON HOLSTER BLD 10FT (ELECTRODE) ×2 IMPLANT
SOLUTION ANTI FOG 6CC (MISCELLANEOUS) ×2 IMPLANT
SPONGE DRAIN TRACH 4X4 STRL 2S (GAUZE/BANDAGES/DRESSINGS) ×2 IMPLANT
SUT CHROMIC 3 0 SH 27 (SUTURE) ×2 IMPLANT
SUT PROLENE 2 0 CT2 30 (SUTURE) ×2 IMPLANT
TUBE CONNECTING 12'X1/4 (SUCTIONS) ×1
TUBE CONNECTING 12X1/4 (SUCTIONS) ×2 IMPLANT

## 2018-07-21 NOTE — ED Notes (Signed)
MD at bedside. Cx x-ray and EKG prior to OR.

## 2018-07-21 NOTE — Anesthesia Preprocedure Evaluation (Signed)
Anesthesia Evaluation  Patient identified by MRN, date of birth, ID band Patient awake    Reviewed: Allergy & Precautions, NPO status , Patient's Chart, lab work & pertinent test results  Airway Mallampati: II  TM Distance: >3 FB Neck ROM: Full    Dental  (+) Edentulous Upper, Edentulous Lower   Pulmonary Current Smoker,     + decreased breath sounds      Cardiovascular  Rhythm:Regular Rate:Normal     Neuro/Psych    GI/Hepatic   Endo/Other    Renal/GU      Musculoskeletal   Abdominal   Peds  Hematology   Anesthesia Other Findings   Reproductive/Obstetrics                             Anesthesia Physical Anesthesia Plan  ASA: IV and emergent  Anesthesia Plan: General   Post-op Pain Management:    Induction: Intravenous  PONV Risk Score and Plan: Dexamethasone and Ondansetron  Airway Management Planned: Oral ETT and Video Laryngoscope Planned  Additional Equipment:   Intra-op Plan:   Post-operative Plan: Possible Post-op intubation/ventilation  Informed Consent: I have reviewed the patients History and Physical, chart, labs and discussed the procedure including the risks, benefits and alternatives for the proposed anesthesia with the patient or authorized representative who has indicated his/her understanding and acceptance.       Plan Discussed with: CRNA and Anesthesiologist  Anesthesia Plan Comments:         Anesthesia Quick Evaluation

## 2018-07-21 NOTE — Consult Note (Signed)
Reason for Consult: Left vocal cord mass, respiratory distress  HPI:  Ronald Ruiz is an 63 y.o. male who was transferred from [facility] ER due to respiratory difficulty. He was initially referred by [facility] for evaluation of his left vocal cord mass and hoarseness.  According to the patient, he has been hoarse for the past 3 months.  He has a history of COPD and multiple pulmonary nodules.  He recently underwent a PET CT scan which showed an asymmetric soft tissue mass within the left vocal cord.  There was increased SUV uptake within the mass, suggestive of malignancy.  His pulmonary nodules were not hypermetabolic.  The patient has been smoking 40+ years, usually more than 1 pack per day.  He also has lost 40 lb over the last 6 months.  Currently he has difficulty lying flat due to shortness of breath.  He has no previous ENT surgery.  No significant lymphadenopathy was noted on his PET scan.   Past Medical History:  Diagnosis Date  . Bronchitis 03/2018  . Herpes   . Lung nodule 2020  . Substance abuse ([place])    opiate addiction, been on methadone for 11 years    History reviewed. No pertinent surgical history.  Family History  Problem Relation Age of Onset  . Alcohol abuse Father   . Cancer Father   . Cirrhosis Father   . Drug abuse Brother     Social History:  reports that he has been smoking cigarettes. He has a 100.00 pack-year smoking history. He has never used smokeless tobacco. He reports previous alcohol use. He reports previous drug use.  Allergies: No Known Allergies  Prior to Admission medications   Medication Sig Start Date End Date Taking? Authorizing Provider  albuterol (PROVENTIL HFA;VENTOLIN HFA) 108 (90 Base) MCG/ACT inhaler Inhale into the lungs. 03/30/18 07/03/18  [provider]  budesonide-formoterol (SYMBICORT) 80-4.5 MCG/ACT inhaler Inhale 2 puffs into the lungs daily for 30 days. 07/02/18 08/01/18  [name], MD  methadone  (DOLOPHINE) 10 MG/ML solution Take 110 mg by mouth daily.    [provider]  Tiotropium Bromide Monohydrate (SPIRIVA RESPIMAT) 2.5 MCG/ACT AERS Inhale 2 puffs into the lungs daily. 07/12/18   Mannam, Hart Robinsons, MD  varenicline (CHANTIX STARTING MONTH PAK) 0.5 MG X 11 & 1 MG X 42 tablet Use according to package directions 06/14/18   [name], MD   The patient's review of systems (constitutional, eyes, ENT, cardiovascular, respiratory, GI, musculoskeletal, skin, neurologic, psychiatric, endocrine, hematologic, allergic) is noted in the ROS questionnaire.  It is reviewed with the patient.  Blood pressure (!) 161/71, pulse 81, temperature 98.2 F (36.8 C), temperature source Oral, resp. rate 15, height 6' (1.829 m), weight 68 kg, SpO2 100 %. Physical Exam: General: Communicates without difficulty, well nourished, no acute distress. Head: Normocephalic, no evidence injury, no tenderness, facial buttresses intact without stepoff. Face/sinus: No tenderness to palpation and percussion. Facial movement is normal and symmetric. Eyes: PERRL, EOMI. No scleral icterus, conjunctivae clear. Neuro: CN II exam reveals vision grossly intact.  No nystagmus at any point of gaze. Ears: Auricles well formed without lesions.  Ear canals are intact without mass or lesion.  No erythema or edema is appreciated.  The TMs are intact without fluid. Nose: External evaluation reveals normal support and skin without lesions.  Dorsum is intact.  Anterior rhinoscopy reveals pink mucosa over anterior aspect of inferior turbinates and intact septum.  No purulence noted. Oral:  Oral cavity and oropharynx  are intact, symmetric, without erythema or edema.  Mucosa is moist without lesions. Neck: Full range of motion without pain.  There is no significant lymphadenopathy.  No masses palpable.  Thyroid bed within normal limits to palpation.  Parotid glands and submandibular glands equal bilaterally without mass.  Trachea is midline.  Neuro:  CN 2-12 grossly intact. Gait normal.   Procedure:  Flexible Fiberoptic Laryngoscopy Risks, benefits, and alternatives of flexible endoscopy were explained to the patient.  Specific mention was made of the risk of throat numbness with difficulty swallowing, possible bleeding from the nose and mouth, and pain from the procedure.  The patient gave oral consent to proceed.  The nasal cavities were decongested and anesthetised with a combination of oxymetazoline and 4% lidocaine solution.  The flexible scope was inserted into the right nasal cavity and advanced towards the nasopharynx.  Visualized mucosa over the turbinates and septum were as described above.  The nasopharynx was clear.  Oropharyngeal walls were symmetric and mobile without lesion, mass, or edema.  Supraglottic structures were free of edema, mass, and asymmetry.  A left vocal cord mass was noted, involving the left arytenoid cartilage.  The left vocal cord was nonmobile. The right vocal cord was mobile but sluggish.  His glottic opening was significantly narrowed due to the left vocal cord mass.   Base of tongue was within normal limits.   Assessment/Plan: 1.  A left vocal cord mass is noted, involving the left arytenoid cartilage.  The left vocal cord is nonmobile. The right vocal cord is mobile but sluggish.  His glottic opening is narrowed due to the left vocal cord mass.  2.  The findings are concerning for malignancy, specifically squamous cell carcinoma.  3.  Due to the location of the laryngeal mass, the patient is in danger of developing upper airway obstruction.  As a result, tracheostomy is recommended to protect his airway.  Direct laryngoscopy and biopsy will be performed at the same time. The risks, benefits, alternatives and details of the procedures are discussed with the patient.  Questions are invited and answered.  4.  The patient would like to proceed with the procedures.  [place] Monds W Huong Luthi 07/21/2018, 7:12 PM

## 2018-07-21 NOTE — H&P (Signed)
NAME:  Ronald Ruiz, MRN:  [id number], DOB:  09-21-1955, LOS: 0 ADMISSION DATE:  07/21/2018, CONSULTATION DATE:  07/21/2018 REFERRING MD:  ED physician, CHIEF COMPLAINT:  dyspnea  Brief History   63 year old man with history of substance abuse on methadone, emphysema, chronic bronchitis, recently found to have lung nodules and vocal cord lesion presenting with worsening dyspnea.   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 He was having upper respiratory symptoms with dyspnea, cough and had a chest Xray in January. He ended up having a CT chest 2/11 and was found to have clustered fine nodular and irregular opacity predominant in medial lower lobes, central middle lobe, and lingula. He was found to have a 1cm pulmonary nodule in the right middle lobe and an irregular nodule in the left lower lobe measuring 1.1 cm. He was seen by pulmonary on 2/14 ([place]). He had a PET scan 2/25. No significant uptake in the lung nodules. There was abnormal increased signal in the left vocal cord. Since then he has been having worse trouble breathing. He has dyspnea at rest and it is worse with exertion. He has wheezing. Denies cough or hemoptysis. No fevers or chills. He has had 35 lb weight loss over the last 7 months. No nausea/vomiting, chest pain, abdominal pain, diarrhea, dysuria, difficulty with urination. He lives by himself. He has 3 daughters who would make decisions for him if he is unable as he is a widower.  Past Medical History   substance abuse on methadone, emphysema, chronic bronchitis, lung nodules  Significant Hospital Events   2/29 > OR  Consults:  ENT 2/29  Procedures:  ENT  Significant Diagnostic Tests:  CXR 2/29> Emphysematous changes and pulmonary scarring but no acute overlying pulmonary process.  PET 2/25>  There is no significant radiotracer uptake associated with the right middle lobe and left lower lobe lung nodules. Additional small scattered nodules have a postinflammatory/infectious appearance. There is abnormal increased soft tissue and hypermetabolism associated with the left local cord. Emphysema and diffuse bronchial wall thickening compatible with COPD. Aortic atherosclerosis with 3 vessel coronary artery atherosclerotic calcifications.  Micro Data:  None  Antimicrobials:  None  Interim history/subjective:  Doing well  Objective   Blood pressure (!) 182/145, pulse 89, temperature 98.2 F (36.8 C), temperature source Oral, resp. rate 18, height 6' (1.829 m), weight 68 kg, SpO2 100 %.       No intake or output data in the 24 hours ending 07/21/18 2028 Filed Weights   07/21/18 1851  Weight: 68 kg    Examination: General: NAD HENT: [place]/AT, moist mucous membranes Lungs: Decreased at bases but no wheezes or rales Cardiovascular: RRR Abdomen: Soft, nontender, nondistended Extremities: No LE edema Neuro: Alert, oriented, answering questions appropriately  Assessment & Plan:  63 year old man with history of substance abuse on methadone, emphysema, chronic bronchitis, recently found to have lung nodules and vocal cord lesion presenting with worsening dyspnea.   Dyspnea, vocal cord mass, lung nodules: Seen by ENT in ED. --OR with ENT, planned for trach --Possible vent postoperatively and will wean vent in AM --Follow up pathology  COPD: Not presently in exacerbation --Continue Symbicort as Brovana and Pulmicort nebs. Atrovent scheduled. --Duonebs prn --Chantix on hold  Substance abuse history on methadone: Holding methadone for now. Likely fent prn after OR and then restart methadone when able.  Best practice:  Diet: NPO Pain/Anxiety/Delirium protocol (if indicated): fent prn VAP protocol (if indicated): Will order if still on vent after procedure DVT prophylaxis: SQ  Lovenox tomorrow GI prophylaxis: H2B Glucose control: Monitor Mobility: OOB Code Status: Full Family Communication: Discussed plan with patient and two of his daughters Disposition: ICU  Labs   CBC: Recent Labs  Lab 07/21/18 1914  WBC 10.7*  NEUTROABS 9.4*  HGB 13.5  HCT 43.0  MCV 97.3  PLT 144*    Basic Metabolic Panel: Recent Labs  Lab 07/21/18 1914  NA 137  K 4.1  CL 103  CO2 27  GLUCOSE 116*  BUN 15  CREATININE 0.90  CALCIUM 9.4   Recent Labs  Lab 07/21/18 1914  WBC 10.7*    Coagulation Profile: Recent Labs  Lab 07/21/18 1914  INR 1.0   CBG: Recent Labs  Lab 07/17/18 0752  GLUCAP 91    Review of Systems:   Complete review of systems performed and negative except per HPI  Past Medical History  He,  has a past medical history of Bronchitis (03/2018), Herpes, Lung nodule (2020), and Substance abuse ([place]).   Surgical History   History reviewed. No pertinent surgical history.   Social History   reports that he has been smoking cigarettes. He has a 100.00 pack-year smoking history. He has never used smokeless tobacco. He reports previous alcohol use. He reports previous drug use.   Family History   His family history includes Alcohol abuse in his father; Cancer in his father; Cirrhosis in his father; Drug abuse in his brother.   Allergies No Known Allergies   Home Medications  Prior to Admission medications   Medication Sig Start Date End Date Taking? Authorizing Provider  albuterol (PROVENTIL HFA;VENTOLIN HFA) 108 (90 Base) MCG/ACT inhaler Inhale 1-2 puffs into the lungs every 6 (six) hours as needed for wheezing.  03/30/18 07/21/18 Yes [provider]  budesonide-formoterol (SYMBICORT) 80-4.5 MCG/ACT inhaler Inhale 2 puffs into the lungs daily for 30 days. 07/02/18 08/01/18 Yes Stacks, Cletus Gash, MD  Dextromethorphan-Menthol (DELSYM COUGH RELIEF MT) Use as directed 30 mLs in the mouth or throat 2 (two) times daily as needed (for cough).    Yes [provider]  methadone (DOLOPHINE) 10 MG/ML solution Take 110 mg by mouth daily.   Yes [provider]  varenicline (CHANTIX STARTING MONTH PAK) 0.5 MG X 11 & 1 MG X 42 tablet Use according to package directions Patient taking differently: Take 0.5-1 mg by mouth See admin instructions. Take the meds as directed 06/14/18  Yes Stacks, Cletus Gash, MD  Tiotropium Bromide Monohydrate (SPIRIVA RESPIMAT) 2.5 MCG/ACT AERS Inhale 2 puffs into the lungs daily. Patient not taking: Reported on 07/21/2018 07/12/18   Marshell Garfinkel, MD     Critical care time: The patient is critically ill with multiple organ systems failure and requires high complexity decision making for assessment and support, frequent evaluation and titration of therapies, application of advanced monitoring technologies and extensive interpretation of multiple databases.   Critical Care Time devoted to patient care services described in this note is  50 Minutes. This time reflects time of care of this signee. This critical care time does not reflect procedure time, or teaching time or supervisory time of PA/NP/Med student/Med Resident etc but could involve care discussion time.  Jacques Earthly, M.D. [facility] Pulmonary/Critical Care Medicine After hours pager: [phone].

## 2018-07-21 NOTE — ED Notes (Signed)
Paged gso ent per Dr. Melina Copa

## 2018-07-21 NOTE — ED Provider Notes (Signed)
[facility] EMERGENCY DEPARTMENT Provider Note   CSN: [id number] Arrival date & time: 07/21/18  [place]    History   Chief Complaint Chief Complaint  Patient presents with  . Shortness of Breath    HPI Ronald Ruiz is a 63 y.o. male.  [facility]idor.  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  He said he follows with Dr. Benjamine Mola.  He was evaluated in the clinic on Thursday and had a fiberoptic laryngoscopy.  Patient states since then he has had increased difficulty breathing and stridor.  It acutely was worse today and so he presented to outside hospital.  There they gave him an albuterol neb and Decadron and transferred here.  Patient states he still feels tight in the throat but does not feel distressed by this.  He denies any fever nausea vomiting chest pain abdominal pain vomiting or diarrhea.     The history is provided by the patient.  Shortness of Breath  Severity:  Moderate Onset quality:  Gradual Timing:  Constant Progression:  Worsening Chronicity:  Recurrent Relieved by:  Nothing Worsened by:  Exertion Ineffective treatments:  Position changes Associated symptoms: no abdominal pain, no chest pain, no diaphoresis, no fever, no headaches, no neck pain, no rash, no sore throat, no syncope and no vomiting   Risk factors: tobacco use     Past Medical History:  Diagnosis Date  . Bronchitis 03/2018  . Lung nodule 2020  . Substance abuse ([place])    opiate addiction, been on methadone for 11 years    There are no active problems to display for this patient.   No past surgical history on file.      Home Medications    Prior to Admission medications   Medication Sig Start Date End Date Taking? Authorizing Provider  albuterol (PROVENTIL HFA;VENTOLIN HFA) 108 (90 Base) MCG/ACT inhaler Inhale into the lungs.  03/30/18 07/03/18  [provider]  budesonide-formoterol (SYMBICORT) 80-4.5 MCG/ACT inhaler Inhale 2 puffs into the lungs daily for 30 days. 07/02/18 08/01/18  [name], MD  methadone (DOLOPHINE) 10 MG/ML solution Take 110 mg by mouth daily.    [provider]  Tiotropium Bromide Monohydrate (SPIRIVA RESPIMAT) 2.5 MCG/ACT AERS Inhale 2 puffs into the lungs daily. 07/12/18   Mannam, Hart Robinsons, MD  varenicline (CHANTIX STARTING MONTH PAK) 0.5 MG X 11 & 1 MG X 42 tablet Use according to package directions 06/14/18   [name], MD    Family History Family History  Problem Relation Age of Onset  . Alcohol abuse Father   . Cancer Father   . Cirrhosis Father   . Drug abuse Brother     Social History Social History   Tobacco Use  . Smoking status: Current Every Day Smoker    Packs/day: 2.00    Years: 50.00    Pack years: 100.00    Types: Cigarettes  . Smokeless tobacco: Never Used  . Tobacco comment: 07/06/18 at 10 cigs per day  Substance Use Topics  . Alcohol use: Not Currently  . Drug use: Not Currently     Allergies   Patient has no known allergies.   Review of Systems Review of Systems  Constitutional: Negative for diaphoresis and fever.  HENT: Negative for sore throat.   Eyes: Negative for visual disturbance.  Respiratory: Positive for shortness of breath  and stridor.   Cardiovascular: Negative for chest pain and syncope.  Gastrointestinal: Negative for abdominal pain and vomiting.  Genitourinary: Negative for dysuria.  Musculoskeletal: Negative for neck pain.  Skin: Negative for rash.  Neurological: Negative for headaches.     Physical Exam Updated Vital Signs BP (!) 161/71 (BP Location: Right Arm)   Pulse 81   Temp 98.2 F (36.8 C) (Oral)   Resp 15   Ht 6' (1.829 m)   Wt 68 kg   SpO2 100%   BMI 20.34 kg/m   Physical Exam Vitals signs and nursing note reviewed.  Constitutional:      Appearance: He is well-developed.  HENT:      Head: Normocephalic and atraumatic.  Eyes:     Conjunctiva/sclera: Conjunctivae normal.  Neck:     Musculoskeletal: Neck supple.     Trachea: No tracheal deviation.     Comments: No crepitus Cardiovascular:     Rate and Rhythm: Normal rate and regular rhythm.     Heart sounds: No murmur.  Pulmonary:     Effort: Tachypnea and accessory muscle usage present. No respiratory distress.     Breath sounds: Normal breath sounds. Stridor present.  Abdominal:     Palpations: Abdomen is soft.     Tenderness: There is no abdominal tenderness.  Musculoskeletal:     Right lower leg: He exhibits no tenderness. No edema.     Left lower leg: He exhibits no tenderness. No edema.  Skin:    General: Skin is warm and dry.     Capillary Refill: Capillary refill takes less than 2 seconds.  Neurological:     General: No focal deficit present.     Mental Status: He is alert and oriented to person, place, and time.     GCS: GCS eye subscore is 4. GCS verbal subscore is 5. GCS motor subscore is 6.      ED Treatments / Results  Labs (all labs ordered are listed, but only abnormal results are displayed) Labs Reviewed  CBC WITH DIFFERENTIAL/PLATELET - Abnormal; Notable for the following components:      Result Value   WBC 10.7 (*)    Platelets 144 (*)    Neutro Abs 9.4 (*)    All other components within normal limits  BASIC METABOLIC PANEL - Abnormal; Notable for the following components:   Glucose, Bld 116 (*)    All other components within normal limits  CBC - Abnormal; Notable for the following components:   Platelets 144 (*)    All other components within normal limits  BASIC METABOLIC PANEL - Abnormal; Notable for the following components:   Glucose, Bld 149 (*)    All other components within normal limits  GLUCOSE, CAPILLARY - Abnormal; Notable for the following components:   Glucose-Capillary 139 (*)    All other components within normal limits  GLUCOSE, CAPILLARY - Abnormal; Notable for  the following components:   Glucose-Capillary 139 (*)    All other components within normal limits  MRSA PCR SCREENING  PROTIME-INR  HIV ANTIBODY (ROUTINE TESTING W REFLEX)  TYPE AND SCREEN  ABO/RH  SURGICAL PATHOLOGY    EKG EKG Interpretation  Date/Time:  Saturday July 21 2018 20:02:52 EST Ventricular Rate:  109 PR Interval:    QRS Duration: 113 QT Interval:  356 QTC Calculation: 480 R Axis:   70 Text Interpretation:  Sinus tachycardia Borderline intraventricular conduction delay Nonspecific T abnormalities, lateral leads Borderline prolonged QT interval Baseline wander in lead(s)  III aVF V2 No prior ECG for comparuson No STEMI Confirmed by Antony Blackbird [phone]) on 07/22/2018 1:29:18 PM   Radiology Dg Chest Port 1 View  Result Date: 07/21/2018 CLINICAL DATA:  Shortness of breath. EXAM: PORTABLE CHEST 1 VIEW COMPARISON:  Chest CT 07/03/2018 FINDINGS: The heart is normal in size. The mediastinal and hilar contours appear normal. Emphysematous changes and hyperinflation but no definite acute overlying pulmonary process. No pleural effusions. The bony thorax is intact. IMPRESSION: Emphysematous changes and pulmonary scarring but no acute overlying pulmonary process. Electronically Signed   By: Marijo Sanes M.D.   On: 07/21/2018 20:44    Procedures Procedures (including critical care time)  Medications Ordered in ED Medications - No data to display   Initial Impression / Assessment and Plan / ED Course  I have reviewed the triage vital signs and the nursing notes.  Pertinent labs & imaging results that were available during my care of the patient were reviewed by me and considered in my medical decision making (see chart for details).  Clinical Course as of Jul 21 1413  Sat Jul 21, 2018  1903 Discussed with Dr. Benjamine Mola from ENT who says he is going to be taken the patient to the operating room.  He asked if I could put a call in to critical care to let them know he will need  an ICU bed after procedure.   [MB]  1918 discused with Dr. Detterding from critical care who will have somebody from the ICU come down to evaluate the patient.   [MB]  2006 EKG is sinus tachycardia rate of 109 nonspecific ST-T changes.   [MB]    Clinical Course User Index [MB] Hayden Rasmussen, MD         Final Clinical Impressions(s) / ED Diagnoses   Final diagnoses:  Laryngeal mass  Laryngeal stridor    ED Discharge Orders    None       Hayden Rasmussen, MD 07/22/18 1415

## 2018-07-21 NOTE — Op Note (Signed)
DATE OF PROCEDURE:  07/21/2018                              OPERATIVE REPORT  SURGEON:  Leta Baptist, MD  PREOPERATIVE DIAGNOSES: 1. Respiratory distress. 2. Upper airway obstruction. 3. Laryngeal mass.  POSTOPERATIVE DIAGNOSES: 1. Respiratory distress. 2. Upper airway obstruction. 3. Laryngeal mass.  PROCEDURE PERFORMED:   1. Tracheostomy 2. Microdirect laryngoscopy and biopsy  ANESTHESIA:  General endotracheal tube anesthesia.  COMPLICATIONS:  None.  ESTIMATED BLOOD LOSS:  Minimal.  INDICATION FOR PROCEDURE:  Ronald Ruiz is a 63 y.o. male who presented to the [facility] [place] emergency room tonight complaining of breathing difficulty.  He was noted to have significant dyspnea and respiratory distress.  He was transferred emergently to [facility] for further evaluation and treatment. The patient has a history of COPD and multiple pulmonary nodules.  He recently underwent a PET CT scan which showed an asymmetric soft tissue mass within the left vocal cord.  There was increased SUV uptake within the mass, suggestive of malignancy.  His pulmonary nodules were not hypermetabolic.  The patient has been smoking 40+ years, usually more than 1 pack per day.  He also has lost 40 lb over the last 6 months.  The size of the laryngeal mass has increased over the past week.  It was noted to significantly obstruct his glottic opening.  The decision was therefore made for the patient to undergo the above-stated procedures. The risks, benefits, alternatives, and details of the procedures were discussed with the patient.  Questions were invited and answered.  Informed consent was obtained.  DESCRIPTION:  The patient was taken to the operating room and placed supine on the operating table. General anesthesia was administered via the pre-existing endotracheal tube. The patient was positioned and prepped and draped in the standard fashion for tracheostomy tube placement. 1% lidocaine with 1-100,000  epinephrine was injected at the anterior neck. A 2 cm vertical incision was made in the anterior necks, at the level slightly below the cricoid bone. The incision was carried down past the level of the platysma muscle. The strep muscles were identified and divided in midline. They were retracted laterally, exposing the thyroid gland. The thyroid was divided at midline and retracted laterally. The anterior tracheal wall was exposed. A tracheal window was made at the level of the second tracheal ring. The endotracheal tube was withdrawn. A # 6 cuffed Shiley tracheostomy tube was placed without difficulty. Good end tidal volume and CO2 return was noted. The tracheostomy tube was secured in place with 2-0 Prolene sutures and circumferential necktie.   The patient was repositioned and prepped and draped in the standard fashion for the right laryngoscopy.  A Dedo laryngoscope was used for examination.  The laryngoscope was inserted via the oral cavity into the pharynx.  The vallecula, epiglottis, and piriform sinuses were all noted to be normal.  Examination of the glottis showed a large mass replacing the left vocal cord and the arytenoid cartilage.  The mass did not appear to involve the right vocal cord.  The glottic opening was severely narrowed.  A rigid endoscope was then inserted via the lumen of the laryngoscope, and photodocumentation of the findings was obtained.  Under the visual guidance of the 0 degree endoscope, 4 biopsy specimens were obtained from the laryngeal mass with the laryngeal forceps.  The specimens were sent to the pathology department for permanent histologic identification.  The endoscope and the laryngoscope was withdrawn.  The care of the patient was transferred to the anesthesiologist. The patient was awakened from anesthesia without difficulty.   OPERATIVE FINDINGS:  A # 6 cuffed Shiley tracheostomy tube was placed without difficulty.  The patient was noted to have a large right vocal  cord mass, completely replacing the left vocal cord and the arytenoid cartilage.  SPECIMEN: Left vocal cord mass biopsy specimens.  FOLLOWUP CARE:  The patient will admitted to critical care for ventilator weaning and trach care.  Marcelino Campos W Makayela Secrest 07/21/2018 9:11 PM

## 2018-07-21 NOTE — Anesthesia Procedure Notes (Signed)
Procedure Name: Intubation Date/Time: 07/21/2018 8:40 PM Performed by: Claris Che, CRNA Pre-anesthesia Checklist: Patient identified, Emergency Drugs available, Suction available, Patient being monitored and Timeout performed Patient Re-evaluated:Patient Re-evaluated prior to induction Oxygen Delivery Method: Circle system utilized Preoxygenation: Pre-oxygenation with 100% oxygen Induction Type: IV induction, Rapid sequence and Cricoid Pressure applied Laryngoscope Size: Glidescope and 4 Grade View: Grade II Tube type: Oral Tube size: 6.5 mm Number of attempts: 2 Airway Equipment and Method: Stylet,  Video-laryngoscopy and Bougie stylet Placement Confirmation: ETT inserted through vocal cords under direct vision,  positive ETCO2 and breath sounds checked- equal and bilateral Secured at: 24 cm Tube secured with: Tape Dental Injury: Teeth and Oropharynx as per pre-operative assessment  Difficulty Due To: Difficulty was anticipated

## 2018-07-21 NOTE — ED Triage Notes (Signed)
Pt arrived from [place] ed, mass on vocal chords causing SHOB, surgery scheduled 07/27/18 Dr. Benjamine Mola for removal. RR 12-14, labored. Arrived 2L [place] sats 96

## 2018-07-21 NOTE — Transfer of Care (Signed)
Immediate Anesthesia Transfer of Care Note  Patient: Ronald Ruiz  Procedure(s) Performed: DIRECT LARYNGOSCOPY, TRACHEOSTOMY, BIOPSY (N/A Throat)  Patient Location: PACU  Anesthesia Type:General  Level of Consciousness: oriented, sedated, drowsy, patient cooperative and responds to stimulation  Airway & Oxygen Therapy: Patient Spontanous Breathing and Patient connected to tracheostomy mask oxygen  Post-op Assessment: Report given to RN, Post -op Vital signs reviewed and stable and Patient moving all extremities X 4  Post vital signs: Reviewed and stable  Last Vitals:  Vitals Value Taken Time  BP    Temp    Pulse    Resp    SpO2      Last Pain:  Vitals:   07/21/18 1850  TempSrc:   PainSc: 0-No pain         Complications: No apparent anesthesia complications

## 2018-07-22 DIAGNOSIS — C320 Malignant neoplasm of glottis: Secondary | ICD-10-CM

## 2018-07-22 DIAGNOSIS — J9601 Acute respiratory failure with hypoxia: Secondary | ICD-10-CM

## 2018-07-22 DIAGNOSIS — J383 Other diseases of vocal cords: Secondary | ICD-10-CM

## 2018-07-22 DIAGNOSIS — F17200 Nicotine dependence, unspecified, uncomplicated: Secondary | ICD-10-CM

## 2018-07-22 DIAGNOSIS — Z930 Tracheostomy status: Secondary | ICD-10-CM

## 2018-07-22 LAB — BASIC METABOLIC PANEL
Anion gap: 11 (ref 5–15)
BUN: 12 mg/dL (ref 8–23)
CHLORIDE: 100 mmol/L (ref 98–111)
CO2: 30 mmol/L (ref 22–32)
CREATININE: 0.87 mg/dL (ref 0.61–1.24)
Calcium: 9.5 mg/dL (ref 8.9–10.3)
GFR calc Af Amer: >60 mL/min (ref >60)
GFR calc non Af Amer: >60 mL/min (ref >60)
Glucose, Bld: 149 mg/dL — ABNORMAL HIGH (ref 70–99)
Potassium: 4.4 mmol/L (ref 3.5–5.1)
Sodium: 141 mmol/L (ref 135–145)

## 2018-07-22 LAB — CBC
HCT: 43.3 % (ref 39.0–52.0)
Hemoglobin: 14.1 g/dL (ref 13.0–17.0)
MCH: 31 pg (ref 26.0–34.0)
MCHC: 32.6 g/dL (ref 30.0–36.0)
MCV: 95.2 fL (ref 80.0–100.0)
Platelets: 144 10*3/uL — ABNORMAL LOW (ref 150–400)
RBC: 4.55 MIL/uL (ref 4.22–5.81)
RDW: 13.6 % (ref 11.5–15.5)
WBC: 7.6 10*3/uL (ref 4.0–10.5)
nRBC: 0 % (ref 0.0–0.2)

## 2018-07-22 LAB — GLUCOSE, CAPILLARY
GLUCOSE-CAPILLARY: 92 mg/dL (ref 70–99)
Glucose-Capillary: 139 mg/dL — ABNORMAL HIGH (ref 70–99)
Glucose-Capillary: 96 mg/dL (ref 70–99)

## 2018-07-22 LAB — HIV ANTIBODY (ROUTINE TESTING W REFLEX): HIV Screen 4th Generation wRfx: NONREACTIVE

## 2018-07-22 LAB — MRSA PCR SCREENING: MRSA by PCR: NEGATIVE

## 2018-07-22 MED ORDER — FENTANYL CITRATE (PF) 100 MCG/2ML IJ SOLN: 50.0000 ug | INTRAMUSCULAR | Status: DC | PRN

## 2018-07-22 MED ORDER — CHLORHEXIDINE GLUCONATE 0.12 % MT SOLN
15.0000 mL | Freq: Two times a day (BID) | OROMUCOSAL | Status: DC
  Administered 2018-07-22 – 2018-08-03 (×18): 15 mL via OROMUCOSAL
  Filled 2018-07-22 (×20): qty 15

## 2018-07-22 MED ORDER — DEXTROSE 5 % IV SOLN
INTRAVENOUS | Status: DC
  Administered 2018-07-22 – 2018-07-23 (×2): via INTRAVENOUS

## 2018-07-22 MED ORDER — IPRATROPIUM BROMIDE 0.02 % IN SOLN
0.5000 mg | Freq: Three times a day (TID) | RESPIRATORY_TRACT | Status: DC
  Administered 2018-07-22 – 2018-07-29 (×24): 0.5 mg via RESPIRATORY_TRACT
  Filled 2018-07-22 (×24): qty 2.5

## 2018-07-22 MED ORDER — ORAL CARE MOUTH RINSE
15.0000 mL | Freq: Two times a day (BID) | OROMUCOSAL | Status: DC
  Administered 2018-07-22 – 2018-07-30 (×9): 15 mL via OROMUCOSAL

## 2018-07-22 NOTE — Progress Notes (Signed)
Subjective: No issues overnight. Resting comfortably in bed. On trach collar.  Objective: Vital signs in last 24 hours: Temp:  [97.9 F (36.6 C)-99.9 F (37.7 C)] 98.4 F (36.9 C) (03/01 1130) Pulse Rate:  [59-91] 64 (03/01 1438) Resp:  [0-30] 14 (03/01 1438) BP: (114-182)/(60-145) 114/66 (03/01 1200) SpO2:  [89 %-100 %] 91 % (03/01 1200) FiO2 (%):  [28 %] 28 % (03/01 1438) Weight:  [68 kg] 68 kg (02/29 2200)  Physical Exam: General: NAD. Awake and responsive. Eyes: His pupils are equal, round, reactive to light. Extraocular motion is intact.  Ears: Examination of the ears shows normal auricles and external auditory canals bilaterally. Both tympanic membranes are intact.  Nose: Nasal examination shows normal mucosa, septum, turbinates.  Face: Facial examination shows no asymmetry. Palpation of the face elicit no significant tenderness.  Mouth: Oral cavity examination shows normal mucosa. Edentulous. Neck: Trach tube in place. The trachea is midline. The thyroid is not significantly enlarged.  Neuro: Cranial nerves 2-12 are all grossly in tact. Cardiovascular: RRR Abdomen: Soft, nontender, nondistended Extremities: No LE edema Neuro: Alert, oriented, answering questions via writing.  Recent Labs    07/21/18 1914 07/22/18 0223  WBC 10.7* 7.6  HGB 13.5 14.1  HCT 43.0 43.3  PLT 144* 144*   Recent Labs    07/21/18 1914 07/22/18 0223  NA 137 141  K 4.1 4.4  CL 103 100  CO2 27 30  GLUCOSE 116* 149*  BUN 15 12  CREATININE 0.90 0.87  CALCIUM 9.4 9.5    Medications:  I have reviewed the patient's current medications. Scheduled: . arformoterol  15 mcg Nebulization BID  . budesonide (PULMICORT) nebulizer solution  0.25 mg Nebulization BID  . chlorhexidine  15 mL Mouth Rinse BID  . enoxaparin (LOVENOX) injection  40 mg Subcutaneous Q24H  . ipratropium  0.5 mg Nebulization TID  . mouth rinse  15 mL Mouth Rinse q12n4p   Continuous: . dextrose 50 mL/hr at 07/22/18 1300    QTM:[id number], albuterol, fentaNYL (SUBLIMAZE) injection, ondansetron (ZOFRAN) IV  Assessment/Plan: POD #1 s/p trach, direct laryngoscopy and biopsy. 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 - May deflate trach cuff. - Speech evaluation. Passy Muir valve teaching. Advance oral intake as tolerated. [name] care teaching.   LOS: 1 day   Janaiyah Blackard W Deveney Bayon 07/22/2018, 3:02 PM

## 2018-07-22 NOTE — Progress Notes (Signed)
Family updated on pt's room placement post-op. Pt will go to 3MW-13. Family requested a different room assignment due to pt's wife dying in the same room on 3Mw. Family verbalized an understanding that there currently was no other availability on the unit. Clydene Laming, RN updated Bryce-charge nurse on 3M about pt's family request for a different room assignment if one becomes available.

## 2018-07-22 NOTE — Progress Notes (Signed)
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 He was having upper respiratory symptoms with dyspnea, cough and had a chest Xray in January. He ended up having a CT chest 2/11 and was found to have clustered fine nodular and irregular opacity predominant in medial lower lobes, central middle lobe, and lingula. He was found to have a 1cm pulmonary nodule in the right middle lobe and an irregular nodule in the left lower lobe measuring 1.1 cm. He was seen by pulmonary on 2/14 ([facility]). He had a PET scan 2/25. No significant uptake in the lung nodules. There was abnormal increased signal in the left vocal cord. Since then he has been having worse trouble breathing. He has dyspnea at rest and it is worse with exertion. He has wheezing. Denies cough or hemoptysis. No fevers or chills. He has had 35 lb weight loss over the last 7 months. No nausea/vomiting, chest pain, abdominal pain, diarrhea, dysuria, difficulty with urination. He lives by himself. He has 3 daughters who would make decisions for him if he is unable as he is a widower.  Past Medical History   substance abuse on methadone, emphysema, chronic bronchitis, lung nodules  Significant Hospital Events   2/29 > OR  Consults:  ENT 2/29  Procedures:  ENT  Significant Diagnostic Tests:  CXR 2/29> Emphysematous changes and pulmonary scarring but no acute overlying pulmonary process.  PET 2/25>  There is no significant radiotracer uptake associated with the right middle lobe and left lower lobe lung nodules. Additional small scattered nodules have a postinflammatory/infectious appearance. There is abnormal increased soft tissue and hypermetabolism associated with the left local cord. Emphysema and diffuse bronchial wall thickening compatible with COPD. Aortic atherosclerosis with 3 vessel coronary artery atherosclerotic calcifications.  Micro Data:  None  Antimicrobials:  None  Interim history/subjective:  Doing well.  Patient had tracheostomy tube yesterday evening.  By ENT.  Able to follow basic commands today awake alert oriented family at bedside patient doing well.  We will have speech see him today to see if he can advance his diet.  He is able to communicate via writing  Objective   Blood pressure 135/71, pulse 62, temperature 98.4 F (36.9 C), temperature source Oral, resp. rate 15, height 6' (1.829 m), weight 68 kg, SpO2 92 %.    FiO2 (%):  [28 %] 28 %   Intake/Output Summary (Last 24 hours) at 07/22/2018 1114 Last data filed at 07/22/2018 0400 Gross per 24 hour  Intake 900 ml  Output 1005 ml  Net -105 ml   Filed Weights   07/21/18 1851 07/21/18 2200  Weight: 68 kg 68 kg    Examination: General: No acute distress, resting in bed, sitting up comfortable awake alert following commands, chronically ill-appearing, slightly cachectic HENT: NCAT, sclera clear, tracheostomy tube in place, some bloody secretions around bandaging Lungs: Decreased breath sounds bilaterally Cardiovascular: Regular rate rhythm, S1-S2, no MRG Abdomen: Soft, nontender, nondistended Extremities: No lower extremity edema Neuro: Alert, oriented, following  commands, no focal deficit  Assessment & Plan:  63 year old man with history of substance abuse on methadone, emphysema, chronic bronchitis, recently found to have lung nodules and vocal cord lesion presenting with worsening dyspnea.   Dyspnea,  vocal cord mass, lung nodules:  --Status post tracheostomy by ENT - Tracheostomy tube management per ENT. - No need for mechanical support  Dysphagia -We will have speech therapy see the patient following tracheostomy as well as history of vocal cord lesion - After they have been seen by speech will consider advancement of diet -We will start D5 50 cc an hour in the meantime.  COPD: --Switched baseline inhaler regimen to nebulized therapies. - Continue Brovana Pulmicort and Atrovent.  Substance abuse history on methadone: -Holding home dose methadone -Pain seems well controlled at this time. -We will give PRN fentanyl  Case has been discussed with Dr. Thereasa Solo from the [facility] who is agreed to take over care on 07/23/2018.  Best practice:  Diet: NPO Pain/Anxiety/Delirium protocol (if indicated): fent prn VAP protocol (if indicated): Will order if still on vent after procedure DVT prophylaxis: SQ Lovenox tomorrow GI prophylaxis: H2B Glucose control: Monitor Mobility: OOB Code Status: Full Family Communication: Discussed plan with patient and two of his daughters Disposition: ICU  Labs   CBC: Recent Labs  Lab 07/21/18 1914 07/22/18 0223  WBC 10.7* 7.6  NEUTROABS 9.4*  --   HGB 13.5 14.1  HCT 43.0 43.3  MCV 97.3 95.2  PLT 144* 144*    Basic Metabolic Panel: Recent Labs  Lab 07/21/18 1914 07/22/18 0223  NA 137 141  K 4.1 4.4  CL 103 100  CO2 27 30  GLUCOSE 116* 149*  BUN 15 12  CREATININE 0.90 0.87  CALCIUM 9.4 9.5   Recent Labs  Lab 07/21/18 1914 07/22/18 0223  WBC 10.7* 7.6    Coagulation Profile: Recent Labs  Lab 07/21/18 1914  INR 1.0   CBG: Recent Labs  Lab 07/17/18 0752 07/21/18 2204 07/22/18 0414  GLUCAP 91 139* 139*    Garner Nash, DO [facility] 07/22/2018 11:14 AM  Personal pager: [phone] If unanswered, please page CCM On-call: [phone]

## 2018-07-23 ENCOUNTER — Inpatient Hospital Stay (HOSPITAL_COMMUNITY): Payer: BLUE CROSS/BLUE SHIELD

## 2018-07-23 ENCOUNTER — Encounter (HOSPITAL_COMMUNITY): Payer: Self-pay | Admitting: Otolaryngology

## 2018-07-23 DIAGNOSIS — J387 Other diseases of larynx: Secondary | ICD-10-CM

## 2018-07-23 DIAGNOSIS — E43 Unspecified severe protein-calorie malnutrition: Secondary | ICD-10-CM

## 2018-07-23 DIAGNOSIS — J9601 Acute respiratory failure with hypoxia: Secondary | ICD-10-CM

## 2018-07-23 DIAGNOSIS — J449 Chronic obstructive pulmonary disease, unspecified: Secondary | ICD-10-CM

## 2018-07-23 DIAGNOSIS — J385 Laryngeal spasm: Secondary | ICD-10-CM

## 2018-07-23 DIAGNOSIS — C320 Malignant neoplasm of glottis: Secondary | ICD-10-CM

## 2018-07-23 LAB — GLUCOSE, CAPILLARY
Glucose-Capillary: 107 mg/dL — ABNORMAL HIGH (ref 70–99)
Glucose-Capillary: 111 mg/dL — ABNORMAL HIGH (ref 70–99)
Glucose-Capillary: 113 mg/dL — ABNORMAL HIGH (ref 70–99)
Glucose-Capillary: 132 mg/dL — ABNORMAL HIGH (ref 70–99)
Glucose-Capillary: 90 mg/dL (ref 70–99)
Glucose-Capillary: 95 mg/dL (ref 70–99)

## 2018-07-23 LAB — MAGNESIUM: Magnesium: 2.1 mg/dL (ref 1.7–2.4)

## 2018-07-23 LAB — PHOSPHORUS: Phosphorus: 3.2 mg/dL (ref 2.5–4.6)

## 2018-07-23 MED ORDER — METHADONE HCL 10 MG PO TABS
30.0000 mg | ORAL_TABLET | Freq: Once | ORAL | Status: AC
  Administered 2018-07-23: 30 mg
  Filled 2018-07-23: qty 3

## 2018-07-23 MED ORDER — NICOTINE 21 MG/24HR TD PT24
21.0000 mg | MEDICATED_PATCH | Freq: Every day | TRANSDERMAL | Status: DC
  Administered 2018-07-23 – 2018-08-03 (×11): 21 mg via TRANSDERMAL
  Filled 2018-07-23 (×13): qty 1

## 2018-07-23 MED ORDER — DEXTROSE-NACL 5-0.45 % IV SOLN
INTRAVENOUS | Status: DC
  Administered 2018-07-23 – 2018-07-24 (×2): via INTRAVENOUS

## 2018-07-23 MED ORDER — FENTANYL CITRATE (PF) 100 MCG/2ML IJ SOLN
50.0000 ug | INTRAMUSCULAR | Status: DC | PRN
  Administered 2018-07-24: 50 ug via INTRAVENOUS
  Filled 2018-07-23: qty 2

## 2018-07-23 MED ORDER — METHADONE HCL 10 MG PO TABS
60.0000 mg | ORAL_TABLET | Freq: Every day | ORAL | Status: AC
  Administered 2018-07-24: 60 mg
  Filled 2018-07-23: qty 6

## 2018-07-23 MED ORDER — VITAL AF 1.2 CAL PO LIQD
1000.0000 mL | ORAL | Status: DC
  Administered 2018-07-23 – 2018-07-30 (×7): 1000 mL
  Filled 2018-07-23 (×12): qty 1000

## 2018-07-23 NOTE — Progress Notes (Signed)
[place] TEAM 1 - Stepdown/ICU TEAM  Marquay Kruse  IOE:[id number] DOB: 08/15/1955 DOA: 07/21/2018 PCP: [name], MD    Brief Narrative:  63yo w/ a hx of substance abuse on methadone, emphysema, chronic bronchitis, recently found lung nodules and a vocal cord lesion who presented with worsening dyspnea. He had a CT chest 2/11 for persisting cough and was found to have clustered fine nodular and irregular opacities in medial lower lobes, central middle lobe, and lingula. He was found to have a 1cm pulmonary nodule in the right middle lobe and an irregular nodule in the left lower lobe measuring 1.1 cm. He was seen by Pulmonary on 2/14 ([facility]), and had a PET scan 2/25 which noted no significant uptake in the lung nodules, but abnormal increased signal in the left vocal cord. He experienced worsening trouble breathing, and reported a 35 lb weight loss over 7 months.   Significant Events: 2/29 admit - to OR for trach and bx   Subjective: Patient is sitting up in bed in no apparent respiratory distress.  He indicates that he is comfortable and denies significant pain at this time.  He asked about a nicotine patch, admitting that he is a 2 pack/day smoker.  He also asked that his methadone be resumed as soon as he is able to take oral intake.  He denies shortness of breath chest pain nausea vomiting or abdominal pain.  Assessment & Plan:  Dyspnea, vocal cord mass, lung nodules Status post tracheostomy by ENT - cuff to be deflated today - to work w/ SLP using PMV -awaiting path results with suspicion of malignancy  Dysphagia SLP to eval prior to advancing diet   COPD Continue Brovana Pulmicort and Atrovent -stable at this time  Hx of Substance abuse > on methadone PCCM has been holding home dose methadone w/ no apparent effect on pt - ?if pt taking his methadone -today he is beginning to show some evidence of early withdrawal -plan to resume at low-dose when cleared for oral intake  and follow  DVT prophylaxis: lovenox  Code Status: FULL CODE Family Communication: no family present at time of exam  Disposition Plan:   Consultants:  ENT  Antimicrobials:  none   Objective: Blood pressure 134/76, pulse 75, temperature 98.3 F (36.8 C), temperature source Oral, resp. rate 11, height 6' (1.829 m), weight 64.7 kg, SpO2 93 %.  Intake/Output Summary (Last 24 hours) at 07/23/2018 0906 Last data filed at 07/23/2018 0749 Gross per 24 hour  Intake 939.25 ml  Output 1175 ml  Net -235.75 ml   Filed Weights   07/21/18 1851 07/21/18 2200 07/23/18 0500  Weight: 68 kg 68 kg 64.7 kg    Examination: General: No acute respiratory distress - minimal bleeding from trach site  Lungs: Clear to auscultation bilaterally without wheezes or crackles Cardiovascular: Regular rate and rhythm without murmur gallop or rub normal S1 and S2 Abdomen: Nontender, nondistended, soft, bowel sounds positive, no rebound, no ascites, no appreciable mass Extremities: No significant cyanosis, clubbing, or edema bilateral lower extremities  CBC: Recent Labs  Lab 07/21/18 1914 07/22/18 0223  WBC 10.7* 7.6  NEUTROABS 9.4*  --   HGB 13.5 14.1  HCT 43.0 43.3  MCV 97.3 95.2  PLT 144* 182*   Basic Metabolic Panel: Recent Labs  Lab 07/21/18 1914 07/22/18 0223  NA 137 141  K 4.1 4.4  CL 103 100  CO2 27 30  GLUCOSE 116* 149*  BUN 15 12  CREATININE 0.90 0.87  CALCIUM 9.4 9.5   GFR: Estimated Creatinine Clearance: 80.6 mL/min (by C-G formula based on SCr of 0.87 mg/dL).  Liver Function Tests: No results for input(s): AST, ALT, ALKPHOS, BILITOT, PROT, ALBUMIN in the last 168 hours. No results for input(s): LIPASE, AMYLASE in the last 168 hours. No results for input(s): AMMONIA in the last 168 hours.  Coagulation Profile: Recent Labs  Lab 07/21/18 1914  INR 1.0    CBG: Recent Labs  Lab 07/22/18 0414 07/22/18 1952 07/22/18 2355 07/23/18 0355 07/23/18 0754  GLUCAP 139* 92 96  90 107*    Recent Results (from the past 240 hour(s))  MRSA PCR Screening     Status: None   Collection Time: 07/21/18 10:14 PM  Result Value Ref Range Status   MRSA by PCR NEGATIVE NEGATIVE Final    Comment:        The GeneXpert MRSA Assay (FDA approved for NASAL specimens only), is one component of a comprehensive MRSA colonization surveillance program. It is not intended to diagnose MRSA infection nor to guide or monitor treatment for MRSA infections. Performed at [facility], [place] [address] [place], [place] [address]      Scheduled Meds: . arformoterol  15 mcg Nebulization BID  . budesonide (PULMICORT) nebulizer solution  0.25 mg Nebulization BID  . chlorhexidine  15 mL Mouth Rinse BID  . enoxaparin (LOVENOX) injection  40 mg Subcutaneous Q24H  . ipratropium  0.5 mg Nebulization TID  . mouth rinse  15 mL Mouth Rinse q12n4p     LOS: 2 days   Cherene Altes, MD [facility] Office  [phone] Pager - Text Page per Amion  If 7PM-7AM, please contact night-coverage per Amion 07/23/2018, 9:06 AM

## 2018-07-23 NOTE — Evaluation (Signed)
[facility] Speaking Valve - Evaluation Patient Details  Name: Juandiego Kolenovic MRN: [id number] Date of Birth: Oct 31, 1955  Today's Date: 07/23/2018 Time: 0905-0915 SLP Time Calculation (min) (ACUTE ONLY): 10 min  Past Medical History:  Past Medical History:  Diagnosis Date  . Bronchitis 03/2018  . Herpes   . Lung nodule 2020  . Substance abuse ([place])    opiate addiction, been on methadone for 11 years   Past Surgical History: History reviewed. No pertinent surgical history. HPI:  Pt is a 63 year old man recently found to have lung nodules and L vocal cord lesion presenting with worsening dyspnea, s/p trach 2/29. 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, emphysema, chronic bronchitis   Assessment / Plan / Recommendation Clinical Impression  Pt wore the PMV for 15 min with no evidence of back pressure and stable VS. His voice is hoarse, suspect secondary to presence of glottic mass, and he and his daughter both endorse changes in vocal quality over the last few months. He has a moderate amount of secretions but can expectorate them orally with use of the yankauer given Min cues. Pt appears to be tolerating the valve well this morning, but concern over time would be for reduced tolerance as the mass continues to grow. Recommend wearing it intermittently with supervision. SLP will f/u for continued assessment of tolerance and training. Of note pt's dtr is a nurse for the system and is familiar with PMVs.  SLP Visit Diagnosis: Aphonia (R49.1)    SLP Assessment  Patient needs continued Speech Lanaguage Pathology Services    Follow Up Recommendations  (tba)    Frequency and Duration min 2x/week  2 weeks    PMSV Trial PMSV was placed for: 15 min Able to redirect subglottic air through upper airway:  Yes Able to Attain Phonation: Yes Voice Quality: Hoarse Able to Expectorate Secretions: Yes Level of Secretion Expectoration with PMSV: Oral Breath Support for Phonation: Mildly decreased Intelligibility: Intelligible Respirations During Trial: (WFL) SpO2 During Trial: (low 90s) Pulse During Trial: [facility]) Behavior: Alert;Controlled;Cooperative   Tracheostomy Tube       Vent Dependency  FiO2 (%): 28 %    Cuff Deflation Trial  GO Tolerated Cuff Deflation: (deflated at baseline)        Venita Sheffield Kista Robb 07/23/2018, 9:39 AM   Pollyann Glen, M.A. [place] Acute Environmental education officer [phone] Office [phone]

## 2018-07-23 NOTE — Progress Notes (Signed)
Occupational Therapy Evaluation Patient Details Name: Ronald Ruiz MRN: [id number] DOB: [birth year] Today's Date: 07/23/2018    History of Present Illness 63yo w/ a hx of substance abuse on methadone, emphysema, chronic bronchitis, recently found lung nodules and a vocal cord lesion who presented with worsening dyspnea. He had a CT chest 2/11 for persisting cough and was found to have clustered fine nodular and irregular opacities in medial lower lobes, central middle lobe, and lingula. Path report pending. Trach 07/21/2018.    Clinical Impression   PTA, pt lived alone and was independent with ADL and mobility. Pt is  Widower, has 3 daughters and recently retired from being a Freight forwarder at [employer]. Pt currently requires Min HHA with mobility around unit and LB ADL. VSS with max HR briefly to 131 and SpO2 @ 96 on 5L FiO2 @ 28%. Pt plans to DC home to daughter's Ronald Ruiz) house after DC. At this time do not anticipate need for OT follow up after DC. Will follow acutely to facilitate safe DC home with daughter. Discussed DC plans with pt and his daughter and they verbalized understanding.     Follow Up Recommendations  Supervision/Assistance - 24 hour(initially)    Equipment Recommendations  None recommended by OT    Recommendations for Other Services       Precautions / Restrictions Precautions Precautions: Other (comment)(watch O2 sats) Precaution Comments: trach      Mobility Bed Mobility Overal bed mobility: Needs Assistance Bed Mobility: Supine to Sit     Supine to sit: Supervision;HOB elevated        Transfers Overall transfer level: Needs assistance   Transfers: Sit to/from Stand Sit to Stand: Min assist(steady A)              Balance Overall balance assessment: Needs assistance Sitting-balance support: Feet supported Sitting balance-Leahy Scale: Good       Standing balance-Leahy Scale: Fair                             ADL either performed or  assessed with clinical judgement   ADL Overall ADL's : Needs assistance/impaired Eating/Feeding: NPO   Grooming: Sitting;Set up   Upper Body Bathing: Set up;Sitting   Lower Body Bathing: Minimal assistance;Sit to/from stand   Upper Body Dressing : Set up;Sitting   Lower Body Dressing: Minimal assistance;Sit to/from stand   Toilet Transfer: Minimal assistance;Ambulation   Toileting- Clothing Manipulation and Hygiene: Minimal assistance       Functional mobility during ADLs: Minimal assistance(HHA)       Vision Baseline Vision/History: No visual deficits       Perception     Praxis      Pertinent Vitals/Pain Pain Assessment: No/denies pain     Hand Dominance Right   Extremity/Trunk Assessment Upper Extremity Assessment Upper Extremity Assessment: Overall WFL for tasks assessed   Lower Extremity Assessment Lower Extremity Assessment: Defer to PT evaluation   Cervical / Trunk Assessment Cervical / Trunk Assessment: Normal   Communication Communication Communication: Tracheostomy   Cognition Arousal/Alertness: Awake/alert Behavior During Therapy: WFL for tasks assessed/performed Overall Cognitive Status: Within Functional Limits for tasks assessed                                     General Comments       Exercises Exercises: Other exercises Other Exercises Other Exercises: encouraged BUE geneal  AROM   Shoulder Instructions      Home Living Family/patient expects to be discharged to:: Private residence Living Arrangements: Children Available Help at Discharge: Family;Available 24 hours/day Type of Home: House("duplex") Home Access: Stairs to enter Entrance Stairs-Number of Steps: 4 Entrance Stairs-Rails: Right Home Layout: One level     Bathroom Shower/Tub: Tub/shower unit;Door   [employer] Toilet: Standard Bathroom Accessibility: Yes How Accessible: Accessible via walker Home Equipment: Bedside commode          Prior  Functioning/Environment Level of Independence: Independent        Comments: 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        OT Problem List: Decreased activity tolerance;Decreased knowledge of use of DME or AE;Cardiopulmonary status limiting activity      OT Treatment/Interventions: Self-care/ADL training;Therapeutic exercise;Energy conservation;DME and/or AE instruction;Therapeutic activities;Patient/family education    OT Goals(Current goals can be found in the care plan section) Acute Rehab OT Goals Patient Stated Goal: to go home OT Goal Formulation: With patient/family Time For Goal Achievement: 08/06/18 Potential to Achieve Goals: Good  OT Frequency: Min 2X/week   Barriers to D/C:            Co-evaluation PT/OT/SLP Co-Evaluation/Treatment: Yes Reason for Co-Treatment: For patient/therapist safety   OT goals addressed during session: ADL's and self-care      AM-PAC OT "6 Clicks" Daily Activity     Outcome Measure Help from another person eating meals?: Total(NPO) Help from another person taking care of personal grooming?: A Little Help from another person toileting, which includes using toliet, bedpan, or urinal?: A Little Help from another person bathing (including washing, rinsing, drying)?: A Little Help from another person to put on and taking off regular upper body clothing?: A Little Help from another person to put on and taking off regular lower body clothing?: A Little 6 Click Score: 16   End of Session Equipment Utilized During Treatment: Gait belt;Oxygen(5L 28%) Nurse Communication: Mobility status  Activity Tolerance: Patient tolerated treatment well Patient left: in chair;with call bell/phone within reach;with family/visitor present  OT Visit Diagnosis: Unsteadiness on feet (R26.81);Muscle weakness (generalized) (M62.81)                Time: 0626-9485 OT Time Calculation (min): 27 min Charges:  OT General Charges $OT Visit: 1 Visit OT  Evaluation $OT Eval Moderate Complexity: [place], OT/L   Acute OT Clinical Specialist Acute Rehabilitation Services Pager [phone] Office [phone]   [facility] 07/23/2018, 2:03 PM

## 2018-07-23 NOTE — Anesthesia Postprocedure Evaluation (Signed)
Anesthesia Post Note  Patient: Ronald Ruiz  Procedure(s) Performed: DIRECT LARYNGOSCOPY, TRACHEOSTOMY, BIOPSY (N/A Throat)     Patient location during evaluation: PACU Anesthesia Type: General Level of consciousness: awake and alert Pain management: pain level controlled Vital Signs Assessment: post-procedure vital signs reviewed and stable Respiratory status: spontaneous breathing, nonlabored ventilation, respiratory function stable and patient connected to tracheostomy mask oxygen Cardiovascular status: blood pressure returned to baseline and stable Postop Assessment: no apparent nausea or vomiting Anesthetic complications: no    Last Vitals:  Vitals:   07/23/18 1907 07/23/18 1943  BP:    Pulse:    Resp:    Temp:  36.9 C  SpO2: 95%     Last Pain:  Vitals:   07/23/18 1943  TempSrc: Oral  PainSc:                  Ronald Ruiz

## 2018-07-23 NOTE — Procedures (Signed)
Cortrak  Person Inserting Tube:  Willey Blade E, RD Tube Type:  Cortrak - 43 inches Tube Location:  Left nare Initial Placement:  Stomach Secured by: Bridle Technique Used to Measure Tube Placement:  Documented cm marking at nare/ corner of mouth Cortrak Secured At:  68 cm    Cortrak Tube Team Note:  Consult received to place a Cortrak feeding tube.   No x-ray is required. RN may begin using tube.   If the tube becomes dislodged please keep the tube and contact the Cortrak team at [email or web address] ([email or web address]) for replacement.  If after hours and replacement cannot be delayed, place a NG tube and confirm placement with an abdominal x-ray.    Willey Blade, MS, [place], LDN Office: [phone] Pager: [phone] After Hours/Weekend Pager: [phone]

## 2018-07-23 NOTE — Progress Notes (Addendum)
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 S/P tracheostomy, laryngoscopy, and biopsy 2/29.   Currently on trach collar. 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   S/P modified barium swallow evaluation with SLP on 3/2. Remains NPO due to moderate-severe dysphagia r/t glottic mass.   Cortrak tube being placed today for enteral nutrition initiation. Received MD Consult for TF initiation and management.  Labs reviewed. CBG's: 90-107-95 Medications reviewed. IVF: D5 1/2 NS at 60 ml/h  Patient reports 40 lb weight loss, usual weight 180-190 lbs. Overall 22% weight loss. 7% weight loss within the past month. Currently 80% of ideal body weight.  NUTRITION - FOCUSED PHYSICAL EXAM:    Most Recent Value  Orbital Region  Severe depletion  Upper Arm Region  Severe depletion  Thoracic and Lumbar Region  Severe depletion  Buccal Region  Severe  depletion  Temple Region  Severe depletion  Clavicle Bone Region  Severe depletion  Clavicle and Acromion Bone Region  Severe depletion  Scapular Bone Region  Severe depletion  Dorsal Hand  Severe depletion  Patellar Region  Severe depletion  Anterior Thigh Region  Severe depletion  Posterior Calf Region  Moderate depletion  Edema (RD Assessment)  None  Hair  Reviewed  Eyes  Reviewed  Mouth  Reviewed  Skin  Reviewed  Nails  Reviewed       Diet Order:   Diet Order            Diet NPO time specified Except for: Sips with Meds  Diet effective now              EDUCATION NEEDS:   No education needs have been identified at this time  Skin:  Skin Assessment: Reviewed RN Assessment  Last BM:  no BM documented since admisison  Height:   Ht Readings from Last 1 Encounters:  07/21/18 6' (1.829 m)    Weight:   Wt Readings from Last 1 Encounters:  07/23/18 64.7 kg    Ideal Body Weight:  80.9 kg  BMI:  Body mass index is 19.35 kg/m.  Estimated Nutritional Needs:   Kcal:  6378-5885  Protein:  100-120 gm  Fluid:  2 L    Molli Barrows, RD, LDN, [place] Pager [phone] After Hours Pager [phone]

## 2018-07-23 NOTE — Progress Notes (Signed)
Modified Barium Swallow Progress Note  Patient Details  Name: Hoyt Leanos MRN: [id number] Date of Birth: 13-Apr-1956  Today's Date: 07/23/2018  Modified Barium Swallow completed.  Full report located under Chart Review in the Imaging Section.  Brief recommendations include the following:  Clinical Impression  Pt has a moderate-severe pharyngeal dysphagia felt to be largely related to presence of glottic mass and therefore has likely been present PTA. He has minimal hyolaryngeal movement and epiglottic inversion. His base of tongue retraction, pharyngeal squeeze, and UES opening are also reduced. He does not achieve full laryngeal vestibule closure with thin and nectar thick liquids entering the airway before the swallow. He does not trigger a cough during aspiration events, but does have some delayed throat clearing that does not eject aspirates. Cued coughing with PMV in place is not entirely effective, particularly when larger volumes are aspirated. Pt has limited bolus entry into the UES, leaving severe residue behind even with nectar thick liquids. Compensatory strategies for clearance and airway protection were not effective, therefore thicker consistencies were not attempted. Recommend that he remain NPO with additional exercises and therapeutic ice chips with SLP to utilize his swallowing musculature, particularly since he says he may be undergoing XRT. Would consider alternative sources for medication administration/nutrition.   Swallow Evaluation Recommendations   SLP Diet Recommendations: NPO     Medication Administration: Via alternative means     Oral Care Recommendations: Oral care QID   Other Recommendations: Have oral suction available    Venita Sheffield Liam Cammarata 07/23/2018,11:14 AM   Pollyann Glen, M.A. [place] Acute Environmental education officer [phone] Office [phone]

## 2018-07-23 NOTE — Progress Notes (Signed)
Subjective: Resting comfortably in bed. No trach issues.  Objective: Vital signs in last 24 hours: Temp:  [98 F (36.7 C)-99.3 F (37.4 C)] 98.3 F (36.8 C) (03/02 0731) Pulse Rate:  [58-77] 75 (03/02 0800) Resp:  [11-18] 11 (03/02 0800) BP: (109-141)/(46-85) 134/76 (03/02 0800) SpO2:  [90 %-99 %] 93 % (03/02 0800) FiO2 (%):  [28 %] 28 % (03/02 0800) Weight:  [64.7 kg] 64.7 kg (03/02 0500)  Physical Exam: General:NAD. Awake and responsive. Eyes: His pupils are equal, round, reactive to light. Extraocular motion is intact.  Ears: Examination of the ears shows normal auricles and external auditory canals bilaterally. Both tympanic membranes are intact.  Nose: Nasal examination shows normal mucosa, septum, turbinates.  Face: Facial examination shows no asymmetry. Palpation of the face elicit no significant tenderness.  Mouth: Oral cavity examination shows normal mucosa. Edentulous. Neck: Trach tube in place. The trachea is midline. The thyroid is not significantly enlarged.  Neuro: Cranial nerves 2-12 are all grossly in tact. Cardiovascular:RRR Abdomen:Soft, nontender, nondistended Extremities:No LE edema Neuro:Alert, oriented, answering questions via writing.   Recent Labs    07/21/18 1914 07/22/18 0223  WBC 10.7* 7.6  HGB 13.5 14.1  HCT 43.0 43.3  PLT 144* 144*   Recent Labs    07/21/18 1914 07/22/18 0223  NA 137 141  K 4.1 4.4  CL 103 100  CO2 27 30  GLUCOSE 116* 149*  BUN 15 12  CREATININE 0.90 0.87  CALCIUM 9.4 9.5    Medications:  I have reviewed the patient's current medications. Scheduled: . arformoterol  15 mcg Nebulization BID  . budesonide (PULMICORT) nebulizer solution  0.25 mg Nebulization BID  . chlorhexidine  15 mL Mouth Rinse BID  . enoxaparin (LOVENOX) injection  40 mg Subcutaneous Q24H  . ipratropium  0.5 mg Nebulization TID  . mouth rinse  15 mL Mouth Rinse q12n4p   Continuous: . dextrose 50 mL/hr at 07/23/18 0755    LJQ:GBEEFEOFHQRFX, albuterol, fentaNYL (SUBLIMAZE) injection, ondansetron (ZOFRAN) IV  Assessment/Plan: POD #2 s/p trach, direct laryngoscopy and biopsy. 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 Awaiting path. - Trach cuff deflated today. - Speech and swallowing evaluation. Passy Muir valve teaching. Advance oral intake as tolerated. [name] care teaching.   LOS: 2 days   Dewane Timson W Joanathan Affeldt 07/23/2018, 9:27 AM

## 2018-07-23 NOTE — Evaluation (Signed)
Clinical/Bedside Swallow Evaluation Patient Details  Name: Ronald Ruiz MRN: [id number] Date of Birth: 08/31/1955  Today's Date: 07/23/2018 Time: SLP Start Time (ACUTE ONLY): 2 SLP Stop Time (ACUTE ONLY): 0927 SLP Time Calculation (min) (ACUTE ONLY): 12 min  Past Medical History:  Past Medical History:  Diagnosis Date  . Bronchitis 03/2018  . Herpes   . Lung nodule 2020  . Substance abuse ([place])    opiate addiction, been on methadone for 11 years   Past Surgical History: History reviewed. No pertinent surgical history. HPI:  Pt is a 63 year old man recently found to have lung nodules and L vocal cord lesion presenting with worsening dyspnea, s/p trach 2/29. 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, emphysema, chronic bronchitis   Assessment / Plan / Recommendation Clinical Impression  Pt has persistent throat clearing with ice chip trials and seemingly reduced hyolaryngeal movement to palpation. SLP provided Min cues for a more effortful coughs, with which pt was able to orally expectorate what may be a combination of secretions and melted ice. He denies any prior dysphagia, although his daughter says that he has started clearing his throat a lot with PO intake over the last few months. Suspect that he probably has an element of dysphagia from the mass itself, now potentially exacerbated by acute trach. Discussed the possibility of edema s/p procedure, but pt is concerned about his ability to get his medications. He would like to proceed with MBS today to determine if they can be taken orally. Will plan for later this morning. SLP Visit Diagnosis: Dysphagia, unspecified (R13.10)    Aspiration Risk  Moderate aspiration risk    Diet Recommendation NPO   Medication Administration: Via alternative  means    Other  Recommendations Oral Care Recommendations: Oral care QID Other Recommendations: Have oral suction available   Follow up Recommendations (tba)      Frequency and Duration min 2x/week          Prognosis Prognosis for Safe Diet Advancement: Good      Swallow Study   General HPI: Pt is a 63 year old man recently found to have lung nodules and L vocal cord lesion presenting with worsening dyspnea, s/p trach 2/29. 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, emphysema, chronic bronchitis Type of Study: Bedside Swallow Evaluation Previous Swallow Assessment: none in chart Diet Prior to this Study: NPO Temperature Spikes Noted: No Respiratory Status: Trach;Trach Collar Trach Size and Type: Cuff;#6;Deflated;With PMSV in place History of Recent Intubation: No Behavior/Cognition: Alert;Cooperative;Pleasant mood Oral Cavity Assessment: Other (comment)(whitish coating on tongue) Oral Care Completed by SLP: Yes Oral Cavity - Dentition: Edentulous Vision: Functional for self-feeding Self-Feeding Abilities: Able to feed self Patient Positioning: Upright in bed Baseline Vocal Quality: Hoarse Volitional Cough: Strong Volitional Swallow: Able to elicit    Oral/Motor/Sensory Function Overall Oral Motor/Sensory Function: Within functional limits   Ice Chips Ice chips: Impaired Presentation: Spoon Pharyngeal Phase Impairments: Throat Clearing - Immediate;Cough - Delayed   Thin Liquid Thin Liquid: Not tested    Nectar Thick Nectar Thick Liquid: Not tested   Honey Thick Honey Thick Liquid: Not tested   Puree Puree: Not tested  Solid     Solid: Not tested      Venita Sheffield Farah Benish 07/23/2018,9:47 AM  Pollyann Glen, M.A. [place] Acute Environmental education officer [phone] Office  [phone]

## 2018-07-23 NOTE — Evaluation (Signed)
Physical Therapy Evaluation Patient Details Name: Ronald Ruiz MRN: [id number] DOB: [birth year] Today's Date: 07/23/2018   History of Present Illness  63yo w/ a hx of substance abuse on methadone, emphysema, chronic bronchitis, recently found lung nodules and a vocal cord lesion who presented with worsening dyspnea. He had a CT chest 2/11 for persisting cough and was found to have clustered fine nodular and irregular opacities in medial lower lobes, central middle lobe, and lingula. Path report pending. Trach 07/21/2018.   Clinical Impression  Pt was able to walk the entire unit with min hand held assist and second person for safety to manage multiple lines and O2.  VSS with O2 remaining in the mid 90s and HR up to 131 max observed during gait, but mostly sustaining in the 110s.  Pt plans to go home with his daughter at discharge who can take off work if he needs her to be home to care for him.   PT to follow acutely for deficits listed below.      Follow Up Recommendations Home health PT;Supervision for mobility/OOB    Equipment Recommendations  Other (comment)(home O2?)    Recommendations for Other Services   NA    Precautions / Restrictions Precautions Precautions: Other (comment)(watch O2 sats and HR) Precaution Comments: trach      Mobility  Bed Mobility Overal bed mobility: Needs Assistance Bed Mobility: Supine to Sit     Supine to sit: Supervision;HOB elevated     General bed mobility comments: preformed with OT before PT came into the room)  Transfers Overall transfer level: Needs assistance   Transfers: Sit to/from Stand Sit to Stand: Min assist;+2 safety/equipment(steady A)         General transfer comment: Min +2 for line management/O2 management.  Steadying assist.   Ambulation/Gait Ambulation/Gait assistance: Min assist;+2 safety/equipment Gait Distance (Feet): 130 Feet Assistive device: 1 person hand held assist Gait Pattern/deviations: Step-through  pattern;Staggering right;Staggering left Gait velocity: decreased Gait velocity interpretation: 1.31 - 2.62 ft/sec, indicative of limited community ambulator General Gait Details: mildly staggering gait pattern to walk the unit with extra person needed for line management.          Balance Overall balance assessment: Needs assistance Sitting-balance support: Feet supported Sitting balance-Leahy Scale: Good     Standing balance support: Single extremity supported Standing balance-Leahy Scale: Fair Standing balance comment: static standing with close supervision, dynamic activities with min guard/min assist.                              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)     Journalist, newspaper   Dominant Hand: Right    Extremity/Trunk Assessment   Upper Extremity Assessment Upper Extremity Assessment: Defer to OT evaluation    Lower Extremity Assessment Lower Extremity Assessment: Generalized weakness(grossly 3+/5, mildly unsteady on his feet)    Cervical / Trunk Assessment Cervical / Trunk Assessment: Normal  Communication   Communication: Tracheostomy  Cognition Arousal/Alertness: Awake/alert Behavior During  Therapy: WFL for tasks assessed/performed Overall Cognitive Status: Within Functional Limits for tasks assessed                                               Assessment/Plan    PT Assessment Patient needs continued PT services  PT  Problem List Decreased strength;Decreased activity tolerance;Decreased balance;Decreased mobility;Decreased knowledge of use of DME;Cardiopulmonary status limiting activity       PT Treatment Interventions DME instruction;Gait training;Stair training;Functional mobility training;Therapeutic activities;Therapeutic exercise;Balance training;Patient/family education    PT Goals (Current goals can be found in the Care Plan section)  Acute Rehab PT Goals Patient Stated Goal: to go home PT Goal Formulation: With patient/family Time For Goal Achievement: 08/06/18 Potential to Achieve Goals: Good    Frequency Min 3X/week        Co-evaluation PT/OT/SLP Co-Evaluation/Treatment: Yes Reason for Co-Treatment: Complexity of the patient's impairments (multi-system involvement);For patient/therapist safety;To address functional/ADL transfers PT goals addressed during session: Mobility/safety with mobility;Balance;Strengthening/ROM         AM-PAC PT "6 Clicks" Mobility  Outcome Measure Help needed turning from your back to your side while in a flat bed without using bedrails?: A Little Help needed moving from lying on your back to sitting on the side of a flat bed without using bedrails?: A Little Help needed moving to and from a bed to a chair (including a wheelchair)?: A Little Help needed standing up from a chair using your arms (e.g., wheelchair or bedside chair)?: A Little Help needed to walk in hospital room?: A Little Help needed climbing 3-5 steps with a railing? : A Little 6 Click Score: 18    End of Session Equipment Utilized During Treatment: Gait belt;Oxygen Activity Tolerance: Patient tolerated treatment well Patient left: in chair;with call bell/phone within reach;with family/visitor present   PT Visit Diagnosis: Muscle weakness (generalized) (M62.81);Difficulty in walking, not elsewhere classified (R26.2);Unsteadiness on feet (R26.81)    Time: 7062-3762 PT Time Calculation  (min) (ACUTE ONLY): 16 min   Charges:       Wells Guiles B. Malkie Wille, PT, DPT  Acute Rehabilitation #[phone] pager #[phone] office   PT Evaluation $PT Eval Moderate Complexity: 1 Mod         07/23/2018, 5:13 PM

## 2018-07-24 ENCOUNTER — Other Ambulatory Visit: Payer: Self-pay

## 2018-07-24 DIAGNOSIS — J9601 Acute respiratory failure with hypoxia: Secondary | ICD-10-CM

## 2018-07-24 DIAGNOSIS — C320 Malignant neoplasm of glottis: Secondary | ICD-10-CM

## 2018-07-24 LAB — CBC
HCT: 41.5 % (ref 39.0–52.0)
HEMOGLOBIN: 13.6 g/dL (ref 13.0–17.0)
MCH: 30.6 pg (ref 26.0–34.0)
MCHC: 32.8 g/dL (ref 30.0–36.0)
MCV: 93.5 fL (ref 80.0–100.0)
Platelets: 143 10*3/uL — ABNORMAL LOW (ref 150–400)
RBC: 4.44 MIL/uL (ref 4.22–5.81)
RDW: 13.5 % (ref 11.5–15.5)
WBC: 10.7 10*3/uL — ABNORMAL HIGH (ref 4.0–10.5)
nRBC: 0 % (ref 0.0–0.2)

## 2018-07-24 LAB — MAGNESIUM: Magnesium: 2.1 mg/dL (ref 1.7–2.4)

## 2018-07-24 LAB — GLUCOSE, CAPILLARY
Glucose-Capillary: 102 mg/dL — ABNORMAL HIGH (ref 70–99)
Glucose-Capillary: 103 mg/dL — ABNORMAL HIGH (ref 70–99)
Glucose-Capillary: 109 mg/dL — ABNORMAL HIGH (ref 70–99)
Glucose-Capillary: 112 mg/dL — ABNORMAL HIGH (ref 70–99)
Glucose-Capillary: 94 mg/dL (ref 70–99)
Glucose-Capillary: 98 mg/dL (ref 70–99)

## 2018-07-24 LAB — PHOSPHORUS: Phosphorus: 3.1 mg/dL (ref 2.5–4.6)

## 2018-07-24 MED ORDER — ACETAMINOPHEN 325 MG PO TABS
650.0000 mg | ORAL_TABLET | ORAL | Status: DC | PRN
  Administered 2018-07-24 – 2018-07-28 (×5): 650 mg
  Filled 2018-07-24 (×7): qty 2

## 2018-07-24 MED ORDER — METHADONE HCL 10 MG PO TABS
80.0000 mg | ORAL_TABLET | Freq: Every day | ORAL | Status: DC
  Administered 2018-07-25 – 2018-07-30 (×6): 80 mg via ORAL
  Filled 2018-07-24 (×6): qty 8

## 2018-07-24 NOTE — Progress Notes (Signed)
[place] TEAM 1 - Stepdown/ICU TEAM  Ronald Ruiz  WSF:[id number] DOB: 03-Sep-1955 DOA: 07/21/2018 PCP: [name], MD    Brief Narrative:  63yo w/ a hx of substance abuse on methadone, emphysema, chronic bronchitis, recently found lung nodules and a vocal cord lesion who presented with worsening dyspnea. He had a CT chest 2/11 for persisting cough and was found to have clustered fine nodular and irregular opacities in medial lower lobes, central middle lobe, and lingula. He was found to have a 1cm pulmonary nodule in the right middle lobe and an irregular nodule in the left lower lobe measuring 1.1 cm. He was seen by Pulmonary on 2/14 ([place]), and had a PET scan 2/25 which noted no significant uptake in the lung nodules, but abnormal increased signal in the left vocal cord. He experienced worsening trouble breathing, and reported a 35 lb weight loss over 7 months.   Significant Events: 2/29 admit - to OR for trach and bx   Subjective: Appears to be stabilizing clinically. Awaiting path on mass bx. Cortrak had to be placed as pt did poorly w/ SLP eval. Methadone being slowly resumed and titrated toward baseline dose. Denies new complaints.   Assessment & Plan:  Dyspnea, vocal cord mass, lung nodules Status post tracheostomy by ENT - to work w/ SLP using PMV -awaiting path results with suspicion of malignancy - trach to be changed 3/4  Dysphagia Did poorly w/ SLP eval - Cortrak in place - cont tube feeds for now - reassess in 48-72 hrs - given vocal cord mass, he could eventually require PEG  COPD Continue Brovana Pulmicort and Atrovent -stable at this time  Hx of Substance abuse > on methadone Methadone slowly being titrated back up - no evidence of withdrawal at this time   DVT prophylaxis: lovenox  Code Status: FULL CODE Family Communication: spoke w/ daughter at bedside   Disposition Plan: transfer to SDU bed   Consultants:  ENT  Antimicrobials:  none    Objective: Blood pressure 128/62, pulse 74, temperature 99 F (37.2 C), temperature source Oral, resp. rate 19, height 6' (1.829 m), weight 64.7 kg, SpO2 94 %.  Intake/Output Summary (Last 24 hours) at 07/24/2018 1018 Last data filed at 07/24/2018 0900 Gross per 24 hour  Intake 1902.67 ml  Output 1050 ml  Net 852.67 ml   Filed Weights   07/21/18 2200 07/23/18 0500 07/24/18 0500  Weight: 68 kg 64.7 kg 64.7 kg    Examination: General: No acute respiratory distress - alert and oriented  Lungs: Clear to auscultation bilaterally - mild upper airway crackles  Cardiovascular: RRR - no M  Abdomen: Nontender, nondistended, soft, bowel sounds positive Extremities: No signif edema B LE   CBC: Recent Labs  Lab 07/21/18 1914 07/22/18 0223 07/24/18 0322  WBC 10.7* 7.6 10.7*  NEUTROABS 9.4*  --   --   HGB 13.5 14.1 13.6  HCT 43.0 43.3 41.5  MCV 97.3 95.2 93.5  PLT 144* 144* 017*   Basic Metabolic Panel: Recent Labs  Lab 07/21/18 1914 07/22/18 0223 07/23/18 1635 07/24/18 0322  NA 137 141  --   --   K 4.1 4.4  --   --   CL 103 100  --   --   CO2 27 30  --   --   GLUCOSE 116* 149*  --   --   BUN 15 12  --   --   CREATININE 0.90 0.87  --   --  CALCIUM 9.4 9.5  --   --   MG  --   --  2.1 2.1  PHOS  --   --  3.2 3.1   GFR: Estimated Creatinine Clearance: 80.6 mL/min (by C-G formula based on SCr of 0.87 mg/dL).  Liver Function Tests: No results for input(s): AST, ALT, ALKPHOS, BILITOT, PROT, ALBUMIN in the last 168 hours. No results for input(s): LIPASE, AMYLASE in the last 168 hours. No results for input(s): AMMONIA in the last 168 hours.  Coagulation Profile: Recent Labs  Lab 07/21/18 1914  INR 1.0    CBG: Recent Labs  Lab 07/23/18 1533 07/23/18 1942 07/23/18 2333 07/24/18 0343 07/24/18 0751  GLUCAP 111* 132* 113* 103* 94    Recent Results (from the past 240 hour(s))  MRSA PCR Screening     Status: None   Collection Time: 07/21/18 10:14 PM  Result Value  Ref Range Status   MRSA by PCR NEGATIVE NEGATIVE Final    Comment:        The GeneXpert MRSA Assay (FDA approved for NASAL specimens only), is one component of a comprehensive MRSA colonization surveillance program. It is not intended to diagnose MRSA infection nor to guide or monitor treatment for MRSA infections. Performed at [facility], [place] [address], [place], [place] [address]      Scheduled Meds: . arformoterol  15 mcg Nebulization BID  . budesonide (PULMICORT) nebulizer solution  0.25 mg Nebulization BID  . chlorhexidine  15 mL Mouth Rinse BID  . enoxaparin (LOVENOX) injection  40 mg Subcutaneous Q24H  . ipratropium  0.5 mg Nebulization TID  . mouth rinse  15 mL Mouth Rinse q12n4p  . methadone  60 mg Per Tube Daily  . nicotine  21 mg Transdermal Daily     LOS: 3 days   Cherene Altes, MD [facility] Office  [phone] Pager - Text Page per Amion  If 7PM-7AM, please contact night-coverage per Amion 07/24/2018, 10:18 AM

## 2018-07-24 NOTE — Progress Notes (Signed)
Called report to [place], [place], pt going to [phone]. Patient aware that he will be transferring to another floor. Pt daughter at bedside. VSS, pt transferred on monitor, feeding tube paused, all belongings are with patient. Pt transferred without complication.

## 2018-07-24 NOTE — Progress Notes (Signed)
  Speech Language Pathology Treatment: Dysphagia;Passy Muir Speaking valve  Patient Details Name: Ronald Ruiz MRN: [id number] DOB: [birth year] Today's Date: 07/24/2018 Time: 4854-6270 SLP Time Calculation (min) (ACUTE ONLY): 13 min  Assessment / Plan / Recommendation Clinical Impression  Pt has mildly improved vocal intensity today but still with hoarse quality. He wore the PMV only briefly (<10 min) as nursing arrived to transport him out of the ICU, which seemed to put him in bright spirits. He performed his own oral care and then consumed therapeutic ice chip trials, which were followed by immediate coughing. Min additional cues were provided by SLP to clear wet vocal quality, at which point pt was able to orally expectorate what was likely a combination of melted ice and secretions. Pt verbally acknowledged feeling better and sounding better once this was clear. Min cues were also provided throughout trials to perform additional, effortful swallow to facilitate use of swallowing musculature. Will continue to follow acutely to maximize safety with swallowing. Pt will likely need a PEG (if in line with overall GOC) given level of dysphagia going into cancer treatment, which has the potential to exacerbate dysphagia. For the same reason he would benefit from ongoing SLP f/u post-discharge to address swallowing and PMV use.    HPI HPI: Pt is a 63 year old man recently found to have lung nodules and L vocal cord lesion presenting with worsening dyspnea, s/p trach 2/29. 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, emphysema, chronic bronchitis      SLP Plan  Continue with current plan of care       Recommendations  Diet recommendations: NPO Medication Administration: Via alternative means      Patient may  use Passy-Muir Speech Valve: Intermittently with supervision;During all therapies with supervision;Caregiver trained to provide supervision PMSV Supervision: Full MD: Please consider changing trach tube to : Smaller size;Cuffless         Oral Care Recommendations: Oral care QID Follow up Recommendations: Home health SLP SLP Visit Diagnosis: Dysphagia, pharyngoesophageal phase (R13.14);[place]phonia (R49.1) Plan: Continue with current plan of care       Lluveras Saysha Menta 07/24/2018, 4:56 PM  Pollyann Glen, M.A. [place] Acute Environmental education officer [phone] Office [phone]

## 2018-07-24 NOTE — Progress Notes (Signed)
Nurse attempted to call report twice to 2W15 and is unable to get in contact with nurse that will be receiving patient. Nurse will try again shortly.

## 2018-07-24 NOTE — Progress Notes (Signed)
Subjective: Comfortably in bed. No new c/o.  Objective: Vital signs in last 24 hours: Temp:  [98 F (36.7 C)-99 F (37.2 C)] 99 F (37.2 C) (03/03 0344) Pulse Rate:  [59-94] 69 (03/03 0600) Resp:  [11-27] 11 (03/03 0600) BP: (104-135)/(50-88) 128/62 (03/03 0600) SpO2:  [88 %-98 %] 93 % (03/03 0600) FiO2 (%):  [28 %] 28 % (03/03 0340) Weight:  [64.7 kg] 64.7 kg (03/03 0500)  Physical Exam: General:NAD. Awake and responsive. Eyes: His pupils are equal, round, reactive to light. Extraocular motion is intact.  Ears: Examination of the ears shows normal auricles and external auditory canals bilaterally. Both tympanic membranes are intact.  Nose: Nasal examination shows normal mucosa, septum, turbinates.  Face: Facial examination shows no asymmetry. Palpation of the face elicit no significant tenderness.  Mouth: Oral cavity examination shows normal mucosa. Edentulous. Neck: Trach tube in place. The trachea is midline. The thyroid is not significantly enlarged.  Neuro: Cranial nerves 2-12 are all grossly in tact. Cardiovascular:RRR Abdomen:Soft, nontender, nondistended Extremities:No LE edema Neuro:Alert, oriented  Recent Labs    07/22/18 0223 07/24/18 0322  WBC 7.6 10.7*  HGB 14.1 13.6  HCT 43.3 41.5  PLT 144* 143*   Recent Labs    07/21/18 1914 07/22/18 0223  NA 137 141  K 4.1 4.4  CL 103 100  CO2 27 30  GLUCOSE 116* 149*  BUN 15 12  CREATININE 0.90 0.87  CALCIUM 9.4 9.5    Medications:  I have reviewed the patient's current medications. Scheduled: . arformoterol  15 mcg Nebulization BID  . budesonide (PULMICORT) nebulizer solution  0.25 mg Nebulization BID  . chlorhexidine  15 mL Mouth Rinse BID  . enoxaparin (LOVENOX) injection  40 mg Subcutaneous Q24H  . ipratropium  0.5 mg Nebulization TID  . mouth rinse  15 mL Mouth Rinse q12n4p  . methadone  60 mg Per Tube Daily  . nicotine  21 mg Transdermal Daily   Continuous: . dextrose 5 % and 0.45% NaCl 60  mL/hr at 07/24/18 0600  . feeding supplement (VITAL AF 1.2 CAL) 60 mL/hr at 07/24/18 0616   GUY:QIHKVQQVZDGLO, albuterol, fentaNYL (SUBLIMAZE) injection, ondansetron (ZOFRAN) IV  Assessment/Plan: POD #3 s/p trach, direct laryngoscopy and biopsy. 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 Awaiting path. - Tolerated PMV training yeterday - NPO per speech pathology. - Will change trach to an uncuffed trach tube tomorrow.   LOS: 3 days   Echo Propp W Urijah Raynor 07/24/2018, 6:55 AM

## 2018-07-25 DIAGNOSIS — C320 Malignant neoplasm of glottis: Secondary | ICD-10-CM

## 2018-07-25 DIAGNOSIS — J9601 Acute respiratory failure with hypoxia: Secondary | ICD-10-CM

## 2018-07-25 LAB — GLUCOSE, CAPILLARY
Glucose-Capillary: 109 mg/dL — ABNORMAL HIGH (ref 70–99)
Glucose-Capillary: 110 mg/dL — ABNORMAL HIGH (ref 70–99)
Glucose-Capillary: 118 mg/dL — ABNORMAL HIGH (ref 70–99)
Glucose-Capillary: 90 mg/dL (ref 70–99)
Glucose-Capillary: 91 mg/dL (ref 70–99)
Glucose-Capillary: 93 mg/dL (ref 70–99)

## 2018-07-25 LAB — CBC
HCT: 40.2 % (ref 39.0–52.0)
Hemoglobin: 13.4 g/dL (ref 13.0–17.0)
MCH: 31.5 pg (ref 26.0–34.0)
MCHC: 33.3 g/dL (ref 30.0–36.0)
MCV: 94.4 fL (ref 80.0–100.0)
Platelets: 131 10*3/uL — ABNORMAL LOW (ref 150–400)
RBC: 4.26 MIL/uL (ref 4.22–5.81)
RDW: 13.8 % (ref 11.5–15.5)
WBC: 10.2 10*3/uL (ref 4.0–10.5)
nRBC: 0 % (ref 0.0–0.2)

## 2018-07-25 LAB — COMPREHENSIVE METABOLIC PANEL
ALT: 13 U/L (ref 0–44)
AST: 14 U/L — ABNORMAL LOW (ref 15–41)
Albumin: 3 g/dL — ABNORMAL LOW (ref 3.5–5.0)
Alkaline Phosphatase: 50 U/L (ref 38–126)
Anion gap: 7 (ref 5–15)
BILIRUBIN TOTAL: 0.5 mg/dL (ref 0.3–1.2)
BUN: 22 mg/dL (ref 8–23)
CO2: 29 mmol/L (ref 22–32)
Calcium: 8.9 mg/dL (ref 8.9–10.3)
Chloride: 102 mmol/L (ref 98–111)
Creatinine, Ser: 0.78 mg/dL (ref 0.61–1.24)
GFR calc Af Amer: >60 mL/min (ref >60)
GFR calc non Af Amer: >60 mL/min (ref >60)
Glucose, Bld: 108 mg/dL — ABNORMAL HIGH (ref 70–99)
Potassium: 3.6 mmol/L (ref 3.5–5.1)
Sodium: 138 mmol/L (ref 135–145)
TOTAL PROTEIN: 6.5 g/dL (ref 6.5–8.1)

## 2018-07-25 LAB — MAGNESIUM: Magnesium: 2.1 mg/dL (ref 1.7–2.4)

## 2018-07-25 NOTE — Progress Notes (Signed)
Occupational Therapy Treatment Patient Details Name: Ronald Ruiz MRN: [id number] DOB: 09/03/1955 Today's Date: 07/25/2018    History of present illness 63yo w/ a hx of substance abuse on methadone, emphysema, chronic bronchitis, recently found lung nodules and a vocal cord lesion who presented with worsening dyspnea. He had a CT chest 2/11 for persisting cough and was found to have clustered fine nodular and irregular opacities in medial lower lobes, central middle lobe, and lingula. Path report pending. Trach 07/21/2018.    OT comments  Pt making excellent progress towards occupational therapy goals this session. Pt very motivated for therapeutic intervention and daughter present in the room. Pt performing bed mobility with supervision and min cuing for safety with various lines attached. Pt standing with supervision and ambulating with min A without use of AD to sink. Pt utilized suction toothbrush for oral hygiene, stood to wash UB, and change hospital gown without LOB this session but needing min A overall for balance with dynamic tasks. Pt returning to bed secondary to fatigue.  Follow Up Recommendations  Supervision/Assistance - 24 hour    Equipment Recommendations  None recommended by OT    Recommendations for Other Services      Precautions / Restrictions Precautions Precautions: Other (comment) Precaution Comments: trach Restrictions Weight Bearing Restrictions: No       Mobility Bed Mobility Overal bed mobility: Needs Assistance Bed Mobility: Supine to Sit     Supine to sit: Supervision;HOB elevated        Transfers Overall transfer level: Needs assistance Equipment used: None Transfers: Sit to/from Stand Sit to Stand: Supervision         Balance Overall balance assessment: Needs assistance Sitting-balance support: Feet supported Sitting balance-Leahy Scale: Good     Standing balance support: Single extremity supported Standing balance-Leahy Scale:  Fair Standing balance comment: static standing with close supervision, dynamic activities with min guard/min assist.          ADL either performed or assessed with clinical judgement   ADL Overall ADL's : Needs assistance/impaired     Grooming: Set up;Minimal assistance;Standing;Oral care   Upper Body Bathing: Standing;Minimal assistance       Upper Body Dressing : Standing;Minimal assistance      General ADL Comments: standing at sink with min A for balance for bathing and oral hygiene     Vision Baseline Vision/History: No visual deficits            Cognition Arousal/Alertness: Awake/alert Behavior During Therapy: WFL for tasks assessed/performed Overall Cognitive Status: Within Functional Limits for tasks assessed                Exercises Exercises: Other exercises           Pertinent Vitals/ Pain       Pain Assessment: No/denies pain         Frequency  Min 2X/week        Progress Toward Goals  OT Goals(current goals can now be found in the care plan section)  Progress towards OT goals: Progressing toward goals  Acute Rehab OT Goals Patient Stated Goal: to go home OT Goal Formulation: With patient/family Time For Goal Achievement: 08/08/18 Potential to Achieve Goals: Good  Plan Discharge plan remains appropriate       AM-PAC OT "6 Clicks" Daily Activity     Outcome Measure   Help from another person eating meals?: Total(NPO) Help from another person taking care of personal grooming?: A Little Help from another person toileting, which  includes using toliet, bedpan, or urinal?: A Little Help from another person bathing (including washing, rinsing, drying)?: A Little Help from another person to put on and taking off regular upper body clothing?: A Little Help from another person to put on and taking off regular lower body clothing?: A Little 6 Click Score: 16    End of Session Equipment Utilized During Treatment: Other (comment)(VSS  on RA)      Activity Tolerance Patient tolerated treatment well   Patient Left with call bell/phone within reach;with family/visitor present;in bed;with bed alarm set   Nurse Communication Mobility status        Time: 1601-0932 OT Time Calculation (min): 23 min  Charges: OT General Charges $OT Visit: 1 Visit OT Treatments $Self Care/Home Management : 23-37 mins  Ronald Ruiz P , MS, OTR/L 07/25/2018, 3:14 PM

## 2018-07-25 NOTE — Progress Notes (Signed)
Subjective: No issues overnight. Breathing well via trach. Receiving tube feed.  Objective: Vital signs in last 24 hours: Temp:  [97.7 F (36.5 C)-99 F (37.2 C)] 97.7 F (36.5 C) (03/04 0816) Pulse Rate:  [61-91] 91 (03/04 0816) Resp:  [10-19] 11 (03/04 0816) BP: (96-160)/(43-85) 124/70 (03/04 0816) SpO2:  [92 %-100 %] 100 % (03/04 0816) FiO2 (%):  [28 %] 28 % (03/04 0754)  Physical Exam: General:NAD. Awake and responsive. Eyes: His pupils are equal, round, reactive to light. Extraocular motion is intact.  Ears: Examination of the ears shows normal auricles and external auditory canals bilaterally.  Nose: Nasal examination shows normal mucosa, septum, turbinates.  Face: Facial examination shows no asymmetry. Palpation of the face elicit no significant tenderness.  Mouth: Oral cavity examination shows normal mucosa. Edentulous. Neck: Trach tube in place. The trachea is midline. The thyroid is not significantly enlarged.  Neuro: Cranial nerves 2-12 are all grossly in tact. Abdomen:Soft, nontender, nondistended Extremities:No LE edema Neuro:Alert, oriented  Recent Labs    07/24/18 0322 07/25/18 0251  WBC 10.7* 10.2  HGB 13.6 13.4  HCT 41.5 40.2  PLT 143* 131*   Recent Labs    07/25/18 0251  NA 138  K 3.6  CL 102  CO2 29  GLUCOSE 108*  BUN 22  CREATININE 0.78  CALCIUM 8.9    Medications:  I have reviewed the patient's current medications. Scheduled: . arformoterol  15 mcg Nebulization BID  . budesonide (PULMICORT) nebulizer solution  0.25 mg Nebulization BID  . chlorhexidine  15 mL Mouth Rinse BID  . enoxaparin (LOVENOX) injection  40 mg Subcutaneous Q24H  . ipratropium  0.5 mg Nebulization TID  . mouth rinse  15 mL Mouth Rinse q12n4p  . methadone  80 mg Oral Daily  . nicotine  21 mg Transdermal Daily   Continuous: . feeding supplement (VITAL AF 1.2 CAL) 70 mL/hr at 07/25/18 0415   QIW:[id number], albuterol, ondansetron (ZOFRAN)  IV  Assessment/Plan: POD #4s/p trach, direct laryngoscopy and biopsy. 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 -Tolerated PMV training yeterday - NPO per speech pathology. Tube feed for now. - Will change trach to an uncuffed tube tomorrow (No uncuffed tube on pt floor today).  - Oncology consult.   LOS: 4 days   Rhapsody Wolven W Hendel Gatliff 07/25/2018, 9:50 AM

## 2018-07-25 NOTE — Progress Notes (Signed)
Physical Therapy Treatment Patient Details Name: Ronald Ruiz MRN: [id number] DOB: 05-13-1956 Today's Date: 07/25/2018    History of Present Illness 63yo w/ a hx of substance abuse on methadone, emphysema, chronic bronchitis, recently found lung nodules and a vocal cord lesion who presented with worsening dyspnea. He had a CT chest 2/11 for persisting cough and was found to have clustered fine nodular and irregular opacities in medial lower lobes, central middle lobe, and lingula. Path report pending. Trach 07/21/2018.     PT Comments    Patient progressing well with therapy, ambulating further distances now on RA, VSS. Several LOB in hallway without AD, able to self correct. encouraged more mobility and therex in room.   Follow Up Recommendations  Home health PT;Supervision for mobility/OOB     Equipment Recommendations  Other (comment)    Recommendations for Other Services       Precautions / Restrictions Precautions Precautions: Other (comment) Precaution Comments: trach Restrictions Weight Bearing Restrictions: No    Mobility  Bed Mobility Overal bed mobility: Needs Assistance Bed Mobility: Supine to Sit     Supine to sit: Supervision;HOB elevated        Transfers Overall transfer level: Needs assistance Equipment used: None Transfers: Sit to/from Stand Sit to Stand: Supervision            Ambulation/Gait Ambulation/Gait assistance: Supervision;Min guard Gait Distance (Feet): 200 Feet Assistive device: 1 person hand held assist Gait Pattern/deviations: Step-through pattern;Staggering right;Staggering left Gait velocity: decreased   General Gait Details: ambulating without AD, LOBx2 able to self correct. VSS on RA   Stairs             Wheelchair Mobility    Modified Rankin (Stroke Patients Only)       Balance Overall balance assessment: Needs assistance Sitting-balance support: Feet supported Sitting balance-Leahy Scale: Good      Standing balance support: Single extremity supported Standing balance-Leahy Scale: Fair Standing balance comment: static standing with close supervision, dynamic activities with min guard/min assist.                             Cognition Arousal/Alertness: Awake/alert Behavior During Therapy: WFL for tasks assessed/performed Overall Cognitive Status: Within Functional Limits for tasks assessed                                        Exercises      General Comments        Pertinent Vitals/Pain Pain Assessment: No/denies pain    Home Living                      Prior Function            PT Goals (current goals can now be found in the care plan section) Acute Rehab PT Goals Patient Stated Goal: to go home PT Goal Formulation: With patient/family Time For Goal Achievement: 08/06/18 Potential to Achieve Goals: Good Progress towards PT goals: Progressing toward goals    Frequency    Min 3X/week      PT Plan Current plan remains appropriate    Co-evaluation              AM-PAC PT "6 Clicks" Mobility   Outcome Measure  Help needed turning from your back to your side while in a flat bed without using bedrails?:  A Little Help needed moving from lying on your back to sitting on the side of a flat bed without using bedrails?: A Little Help needed moving to and from a bed to a chair (including a wheelchair)?: A Little Help needed standing up from a chair using your arms (e.g., wheelchair or bedside chair)?: A Little Help needed to walk in hospital room?: A Little Help needed climbing 3-5 steps with a railing? : A Little 6 Click Score: 18    End of Session Equipment Utilized During Treatment: Gait belt;Oxygen Activity Tolerance: Patient tolerated treatment well Patient left: in chair;with call bell/phone within reach;with family/visitor present Nurse Communication: Mobility status PT Visit Diagnosis: Muscle weakness  (generalized) (M62.81);Difficulty in walking, not elsewhere classified (R26.2);Unsteadiness on feet (R26.81)     Time: 9147-8295 PT Time Calculation (min) (ACUTE ONLY): 32 min  Charges:  $Gait Training: 8-22 mins $Therapeutic Activity: 8-22 mins                     Reinaldo Berber, PT, DPT Acute Rehabilitation Services Pager: [phone] Office: [place] 07/25/2018, 12:14 PM

## 2018-07-25 NOTE — Progress Notes (Signed)
[facility] TEAM 1 - Stepdown/ICU TEAM  Francis Doenges  IHK:[id number] DOB: Oct 20, 1955 DOA: 07/21/2018 PCP: [name], MD    Brief Narrative:  63yo w/ a hx of substance abuse on methadone, emphysema, chronic bronchitis, recently found lung nodules and a vocal cord lesion who presented with worsening dyspnea. He had a CT chest 2/11 for persisting cough and was found to have clustered fine nodular and irregular opacities in medial lower lobes, central middle lobe, and lingula. He was found to have a 1cm pulmonary nodule in the right middle lobe and an irregular nodule in the left lower lobe measuring 1.1 cm. He was seen by Pulmonary on 2/14 ([facility]), and had a PET scan 2/25 which noted no significant uptake in the lung nodules, but abnormal increased signal in the left vocal cord. He experienced worsening trouble breathing, and reported a 35 lb weight loss over 7 months.   07/25/2018: Patient seen alongside patient's daughter.  Available records reviewed.  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  Discussed with the medical oncology team, Dr. Irene Limbo.  As per Dr. Irene Limbo oncology team will see patient it as inpatient or outpatient.  No new complaints.  Significant Events: 2/29 admit - to OR for trach and bx   Subjective: Denies any new complaints. No fever or chills. No worsening shortness of breath. Chest pain.  Assessment & Plan: Dyspnea/COPD: Symptoms are stable. Status post trach placement. Patient is on supplemental oxygen. Patient is on Brovana, Pulmicort and Atrovent. Continue to manage expectantly.  Vocal cord mass:  Status post laryngoscopy.   Biopsy revealed squamous cell CA.   Discussed above with oncology team.   ENT team's input is highly appreciated.   Tracheostomy tube management as per ENT.  Dysphagia: Did poorly w/ SLP eval - Cortrak in place - cont tube feeds for now - reassess in 48-72 hrs - given vocal cord mass, he could eventually  require PEG  Hx of Substance abuse: On methadone Methadone slowly being titrated back up - no evidence of withdrawal at this time   Tobacco use: Counseled.  Thrombocytopenia: Check acute hepatitis panel Monitor closely Low threshold to assess for possible HIT  DVT prophylaxis: lovenox  Code Status: FULL CODE Family Communication: spoke w/ daughter at bedside   Disposition Plan: Will depend on hospital course.  Consultants:  ENT Oncology  Antimicrobials:  none   Objective: Blood pressure 108/67, pulse 75, temperature 98.4 F (36.9 C), temperature source Oral, resp. rate 11, height 6' (1.829 m), weight 64.7 kg, SpO2 98 %.  Intake/Output Summary (Last 24 hours) at 07/25/2018 1629 Last data filed at 07/25/2018 0850 Gross per 24 hour  Intake 1210 ml  Output 900 ml  Net 310 ml   Filed Weights   07/21/18 2200 07/23/18 0500 07/24/18 0500  Weight: 68 kg 64.7 kg 64.7 kg    Examination: General: Cachectic.  No acute respiratory distress - alert and oriented  Lungs: Clear to auscultation bilaterally - mild upper airway crackles  Cardiovascular: RRR - no M  Abdomen: Nontender, nondistended, soft, bowel sounds positive Extremities: No signif edema B LE   CBC: Recent Labs  Lab 07/21/18 1914 07/22/18 0223 07/24/18 0322 07/25/18 0251  WBC 10.7* 7.6 10.7* 10.2  NEUTROABS 9.4*  --   --   --   HGB 13.5 14.1 13.6 13.4  HCT 43.0 43.3 41.5 40.2  MCV 97.3 95.2 93.5 94.4  PLT 144* 144* 143* 756*   Basic Metabolic Panel: Recent  Labs  Lab 07/21/18 1914 07/22/18 0223 07/23/18 1635 07/24/18 0322 07/25/18 0251  NA 137 141  --   --  138  K 4.1 4.4  --   --  3.6  CL 103 100  --   --  102  CO2 27 30  --   --  29  GLUCOSE 116* 149*  --   --  108*  BUN 15 12  --   --  22  CREATININE 0.90 0.87  --   --  0.78  CALCIUM 9.4 9.5  --   --  8.9  MG  --   --  2.1 2.1 2.1  PHOS  --   --  3.2 3.1  --    GFR: Estimated Creatinine Clearance: 87.6 mL/min (by C-G formula based on SCr  of 0.78 mg/dL).  Liver Function Tests: Recent Labs  Lab 07/25/18 0251  AST 14*  ALT 13  ALKPHOS 50  BILITOT 0.5  PROT 6.5  ALBUMIN 3.0*   No results for input(s): LIPASE, AMYLASE in the last 168 hours. No results for input(s): AMMONIA in the last 168 hours.  Coagulation Profile: Recent Labs  Lab 07/21/18 1914  INR 1.0    CBG: Recent Labs  Lab 07/24/18 2015 07/24/18 2351 07/25/18 0446 07/25/18 0816 07/25/18 1157  GLUCAP 112* 102* 110* 91 118*    Recent Results (from the past 240 hour(s))  MRSA PCR Screening     Status: None   Collection Time: 07/21/18 10:14 PM  Result Value Ref Range Status   MRSA by PCR NEGATIVE NEGATIVE Final    Comment:        The GeneXpert MRSA Assay (FDA approved for NASAL specimens only), is one component of a comprehensive MRSA colonization surveillance program. It is not intended to diagnose MRSA infection nor to guide or monitor treatment for MRSA infections. Performed at [facility], [place] [address], [place], [place] [address]      Scheduled Meds: . arformoterol  15 mcg Nebulization BID  . budesonide (PULMICORT) nebulizer solution  0.25 mg Nebulization BID  . chlorhexidine  15 mL Mouth Rinse BID  . enoxaparin (LOVENOX) injection  40 mg Subcutaneous Q24H  . ipratropium  0.5 mg Nebulization TID  . mouth rinse  15 mL Mouth Rinse q12n4p  . methadone  80 mg Oral Daily  . nicotine  21 mg Transdermal Daily     LOS: 4 days   Dana Allan, MD [facility] Office  [phone] Pager - Text Page per Amion  If 7PM-7AM, please contact night-coverage per Amion 07/25/2018, 4:29 PM

## 2018-07-25 NOTE — Progress Notes (Signed)
Nutrition Follow Up  DOCUMENTATION CODES:   Severe malnutrition in context of chronic illness  INTERVENTION:    Vital AF 1.2 at goal rate of 70 ml/h  Provides 2016 kcal, 126 gm protein, 1362 ml free water daily  NUTRITION DIAGNOSIS:   Severe Malnutrition related to chronic illness(COPD with new vocal cord mass (suspected malignancy)) as evidenced by severe fat depletion, severe muscle depletion, percent weight loss(7% weight loss in one month), ongoing  GOAL:   Patient will meet greater than or equal to 90% of their needs, met  MONITOR:   TF tolerance, Skin, Labs, Weight trends, I & O's  ASSESSMENT:   63 yo male with 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 ER to [facility] with respiratory difficulty, left vocal cord mass.    2/29 s/p trach, laryngoscopy and biopsy per ENT 3/02 s/p MBSS >> moderate-severe dysphagia 3/02 Cortrak placed >> tip at stomach 3/03 transferred out of [id number]  Pt discussed in progression rounds. He remains NPO; on tracheostomy collar. Current TF: Vital AF 1.2 at goal rate of 70 ml/hr.  Spoke with Regino Schultze, RN; pt tolerating TF well. Labs reviewed >> Mg, Phos, K+ WNL. CBG's [id number].  ENT note 3/4 reviewed. Pathology + squamous cell carcinoma.   Diet Order:   Diet Order            Diet NPO time specified Except for: Sips with Meds  Diet effective now             EDUCATION NEEDS:   No education needs have been identified at this time  Skin:  Skin Assessment: Reviewed RN Assessment  Last BM:  3/2  Height:   Ht Readings from Last 1 Encounters:  07/21/18 6' (1.829 m)    Weight:   Wt Readings from Last 1 Encounters:  07/24/18 64.7 kg    Ideal Body Weight:  80.9 kg  BMI:  Body mass index is 19.35 kg/m.  Estimated Nutritional Needs:   Kcal:  2992-4268  Protein:  100-120 gm  Fluid:  2 L  Arthur Holms, RD, LDN Pager #:  [phone] After-Hours Pager #: [phone]

## 2018-07-26 DIAGNOSIS — F1920 Other psychoactive substance dependence, uncomplicated: Secondary | ICD-10-CM

## 2018-07-26 DIAGNOSIS — J9601 Acute respiratory failure with hypoxia: Secondary | ICD-10-CM

## 2018-07-26 DIAGNOSIS — R1319 Other dysphagia: Secondary | ICD-10-CM

## 2018-07-26 DIAGNOSIS — J439 Emphysema, unspecified: Secondary | ICD-10-CM

## 2018-07-26 DIAGNOSIS — C320 Malignant neoplasm of glottis: Secondary | ICD-10-CM

## 2018-07-26 LAB — GLUCOSE, CAPILLARY
GLUCOSE-CAPILLARY: 94 mg/dL (ref 70–99)
Glucose-Capillary: 100 mg/dL — ABNORMAL HIGH (ref 70–99)
Glucose-Capillary: 105 mg/dL — ABNORMAL HIGH (ref 70–99)
Glucose-Capillary: 115 mg/dL — ABNORMAL HIGH (ref 70–99)
Glucose-Capillary: 99 mg/dL (ref 70–99)
Glucose-Capillary: 99 mg/dL (ref 70–99)

## 2018-07-26 LAB — HEPATITIS PANEL, ACUTE
HCV Ab: 0.1 s/co ratio (ref 0.0–0.9)
Hep A IgM: NEGATIVE
Hep B C IgM: NEGATIVE
Hepatitis B Surface Ag: NEGATIVE

## 2018-07-26 NOTE — Care Management Note (Addendum)
Case Management Note  Patient Details  Name: Laurens Matheny MRN: [id number] Date of Birth: 1956-05-07  Subjective/Objective:    Pt is from home. Pt will d/c home with his daughterAltamease Oiler (p: [phone]). Pt has not had home health services before.  CSW provided [facility] Agency list from Medicare.gov and place on chart. Pt chose [facility] for [place], [place], [place]. Referral made to [facility] with [place]. Awaiting to hear back from Butch Penny if they can take referral.  Per Butch Penny they are able to take referral. DME: [facility] Meds: no problems except for Spiriva, too expensive PCP: [facility], Dr. Livia Snellen Transportation: daughter Pharmacy[facility] [place]               Action/Plan:  Will need HHPT, HHST, HHRN order. He will need trach supplies, tube feeding supplies prior to d/c.   Expected Discharge Date:                  Expected Discharge Plan:  [place]  In-House Referral:  Clinical Social Work  Discharge planning Services     Post Acute Care Choice:  Home Health Choice offered to:  Patient  DME Arranged:    DME Agency:     HH Arranged:  PT, Speech Therapy, RN [place] Agency:  [facility] ([facility])  Status of Service:  In process, will continue to follow  If discussed at Long Length of Stay Meetings, dates discussed:    Additional Comments:  Zenon Mayo, RN 07/26/2018, 3:44 PM

## 2018-07-26 NOTE — Progress Notes (Signed)
Pharmacy Heparin Induced Thrombocytopenia (HIT) Note:  Ronald Ruiz is an 63 y.o. male being evaluated for HIT. Lovenox was started 2/29 for DVT prophylaxis, and baseline platelets were 144. Plts were 188 in January 2020.  Hepatitis panel negative. Is on methadone PTA which has potential to cause thrombocytopenia.   HIT labs were ordered on 3/5 when platelets dropped to 131.  Auto-populate labs: No results found for: HEPINDPLTAB, SRALOWDOSEHP, SRAHIGHDOSEH    CALCULATE SCORE:  4Ts (see the HIT Algorithm) Score  Thrombocytopenia 0  Timing 0  Thrombosis 0  Other causes of thrombocytopenia 0  Total 0    Recommendations (A or B or C) are based on available lab results:  A. No lab results available (HIT antibody and/or SRA ordered): -Low HIT probability: consider discontinuing HIT labs, continue heparin/LMWH, SRA not recommended; no heparin allergy documentation needed  B. HIT antibody result available: -N/A - HIT antibody not available, and/or SRA not available  C. SRA result available -SRA not available  Name of MD Contacted: N. Kamineni  Plan (Discussed with provider) Labs ordered: -HIT labs not needed Heparin allergy: -no allergy documentation needed Anticoagulation plans -continue heparin / LMWH   Elenor Quinones, PharmD, BCPS, [facility] Clinical Pharmacist Phone number [phone] 07/26/2018 7:54 PM

## 2018-07-26 NOTE — Progress Notes (Addendum)
PROGRESS NOTE    Ronald Ruiz  PYK:[id number]  DOB: 1956-02-15  DOA: 07/21/2018 PCP: [name], MD  Brief Narrative: 63yo w/ a hx of substance abuse on methadone, emphysema, chronic bronchitis, recently found lung nodules and a vocal cord lesion who presented with worsening dyspnea. He had a CT chest 2/11 for persisting cough and was found to have clustered fine nodular and irregular opacities in medial lower lobes, central middle lobe, and lingula. He was found to have a 1cm pulmonary nodule in the right middle lobe and an irregular nodule in the left lower lobe measuring 1.1 cm. He was seen by Pulmonary on 2/14 ([place]), and had a PET scan 2/25 which noted no significant uptake in the lung nodules, but abnormal increased signal in the left vocal cord. He experienced worsening trouble breathing, and reported a 35 lb weight loss over 7 months.   Subjective:    Patient still has NG tube.He states he was evaluated by speech therapy who is recommending PEG but also considering repeat FEES in a.m.  Denies any issues with tracheostomy.  Awake alert oriented and communicating appropriately.  Daughters bedside.  Objective: Vitals:   07/26/18 1139 07/26/18 1521 07/26/18 1522 07/26/18 1643  BP: (!) 96/55   (!) 104/54  Pulse: (!) 51  73 71  Resp: 10  15 19   Temp: 97.8 F (36.6 C)   97.8 F (36.6 C)  TempSrc: Oral   Oral  SpO2: 97% 100% 100% 96%  Weight:      Height:        Intake/Output Summary (Last 24 hours) at 07/26/2018 1837 Last data filed at 07/26/2018 1500 Gross per 24 hour  Intake 770 ml  Output 1100 ml  Net -330 ml   Filed Weights   07/23/18 0500 07/24/18 0500 07/26/18 0500  Weight: 64.7 kg 64.7 kg 67.1 kg    Physical Examination:  General exam: Appears calm and comfortable  Respiratory system: Status post trach.  Clear to auscultation. Respiratory effort normal. Cardiovascular system: S1 & S2 heard, RRR. No JVD, murmurs, rubs, gallops or clicks. No pedal  edema. Gastrointestinal system: NG tube in place.  Abdomen is nondistended, soft and nontender. No organomegaly or masses felt. Normal bowel sounds heard. Central nervous system: Alert and oriented. No focal neurological deficits. Extremities: Symmetric 5 x 5 power. Skin: No rashes, lesions or ulcers Psychiatry: Judgement and insight appear normal. Mood & affect appropriate.     Data Reviewed: I have personally reviewed following labs and imaging studies  CBC: Recent Labs  Lab 07/21/18 1914 07/22/18 0223 07/24/18 0322 07/25/18 0251  WBC 10.7* 7.6 10.7* 10.2  NEUTROABS 9.4*  --   --   --   HGB 13.5 14.1 13.6 13.4  HCT 43.0 43.3 41.5 40.2  MCV 97.3 95.2 93.5 94.4  PLT 144* 144* 143* 539*   Basic Metabolic Panel: Recent Labs  Lab 07/21/18 1914 07/22/18 0223 07/23/18 1635 07/24/18 0322 07/25/18 0251  NA 137 141  --   --  138  K 4.1 4.4  --   --  3.6  CL 103 100  --   --  102  CO2 27 30  --   --  29  GLUCOSE 116* 149*  --   --  108*  BUN 15 12  --   --  22  CREATININE 0.90 0.87  --   --  0.78  CALCIUM 9.4 9.5  --   --  8.9  MG  --   --  2.1 2.1 2.1  PHOS  --   --  3.2 3.1  --    GFR: Estimated Creatinine Clearance: 90.9 mL/min (by C-G formula based on SCr of 0.78 mg/dL). Liver Function Tests: Recent Labs  Lab 07/25/18 0251  AST 14*  ALT 13  ALKPHOS 50  BILITOT 0.5  PROT 6.5  ALBUMIN 3.0*   No results for input(s): LIPASE, AMYLASE in the last 168 hours. No results for input(s): AMMONIA in the last 168 hours. Coagulation Profile: Recent Labs  Lab 07/21/18 1914  INR 1.0   Cardiac Enzymes: No results for input(s): CKTOTAL, CKMB, CKMBINDEX, TROPONINI in the last 168 hours. BNP (last 3 results) No results for input(s): PROBNP in the last 8760 hours. HbA1C: No results for input(s): HGBA1C in the last 72 hours. CBG: Recent Labs  Lab 07/25/18 2355 07/26/18 0500 07/26/18 0731 07/26/18 1136 07/26/18 1639  GLUCAP 93 100* 94 115* 99   Lipid Profile: No  results for input(s): CHOL, HDL, LDLCALC, TRIG, CHOLHDL, LDLDIRECT in the last 72 hours. Thyroid Function Tests: No results for input(s): TSH, T4TOTAL, FREET4, T3FREE, THYROIDAB in the last 72 hours. Anemia Panel: No results for input(s): VITAMINB12, FOLATE, FERRITIN, TIBC, IRON, RETICCTPCT in the last 72 hours. Sepsis Labs: No results for input(s): PROCALCITON, LATICACIDVEN in the last 168 hours.  Recent Results (from the past 240 hour(s))  MRSA PCR Screening     Status: None   Collection Time: 07/21/18 10:14 PM  Result Value Ref Range Status   MRSA by PCR NEGATIVE NEGATIVE Final    Comment:        The GeneXpert MRSA Assay (FDA approved for NASAL specimens only), is one component of a comprehensive MRSA colonization surveillance program. It is not intended to diagnose MRSA infection nor to guide or monitor treatment for MRSA infections. Performed at [facility], [place] [address] [place], [place] [address]       Radiology Studies: No results found.      Scheduled Meds: . arformoterol  15 mcg Nebulization BID  . budesonide (PULMICORT) nebulizer solution  0.25 mg Nebulization BID  . chlorhexidine  15 mL Mouth Rinse BID  . enoxaparin (LOVENOX) injection  40 mg Subcutaneous Q24H  . ipratropium  0.5 mg Nebulization TID  . mouth rinse  15 mL Mouth Rinse q12n4p  . methadone  80 mg Oral Daily  . nicotine  21 mg Transdermal Daily   Continuous Infusions: . feeding supplement (VITAL AF 1.2 CAL) 1,000 mL (07/26/18 1728)    Assessment & Plan:    1.  Vocal cord mass/invasive squamous cell carcinoma: Appreciate ENT evaluation and follow-up.  Patient status post trach day 5 and speaking well with Passy-Muir valve.  Trach changed to uncuffed #6 Shiley by ENT.  Will likely need radiation therapy with or without chemotherapy per ENT.  2.  Dysphagia/aspiration risk: Secondary to problem #1.  Still on NG tube.  ENT recommended reevaluation given trach changed to uncuffed.  Will  follow-up speech therapy recommendations in a.m. and consult GI if headed towards PEG placement.  3.  History of polysubstance abuse: Patient on 110 mg of methadone daily at home.  Currently at 80 mg and tolerating well without signs of sedation or withdrawal.  On nicotine patch as well.  4.  Thrombocytopenia: HIV and hepatitis work-up negative.  Unlikely to be HIT per d/w pharmacy.  CBC in a.m.  5.  Emphysema: CT chest on February 11 did show significant emphysematous changes and lung nodules.  Neb treatments PRN.  6.  Weight loss/severe protein calorie malnutrition: Secondary to problem #1.  Continue tube feeds.  DVT prophylaxis: Lovenox Code Status: Full code Family / Patient Communication: Discussed with patient and daughters bedside. Disposition Plan: To be determined     LOS: 5 days    Time spent: 35 minutes    Guilford Shi, MD [facility] Pager [phone]  If 7PM-7AM, please contact night-coverage [email or web address] Password [facility] 07/26/2018, 6:37 PM

## 2018-07-26 NOTE — Progress Notes (Signed)
Subjective: No issues overnight. Tolerated tube feed.  Objective: Vital signs in last 24 hours: Temp:  [97.5 F (36.4 C)-98.8 F (37.1 C)] 98.3 F (36.8 C) (03/05 0735) Pulse Rate:  [58-99] 76 (03/05 0842) Resp:  [10-19] 19 (03/05 0842) BP: (105-145)/(38-69) 118/38 (03/05 0735) SpO2:  [96 %-100 %] 100 % (03/05 0842) FiO2 (%):  [28 %] 28 % (03/05 0842) Weight:  [67.1 kg] 67.1 kg (03/05 0500)  Physical Exam: General:NAD. Awake and responsive. Eyes: His pupils are equal, round, reactive to light. Extraocular motion is intact.  Ears: Examination of the ears shows normal auricles and external auditory canals bilaterally.  Nose: Nasal examination shows normal mucosa, septum, turbinates.  Face: Facial examination shows no asymmetry. Palpation of the face elicit no significant tenderness.  Mouth: Oral cavity examination shows normal mucosa. Edentulous. Neck: Trach tube in place. The trachea is midline. The thyroid is not significantly enlarged.  Neuro: Cranial nerves 2-12 are all grossly in tact. Abdomen:Soft, nontender, nondistended Extremities:No LE edema Neuro:Alert, oriented  Recent Labs    07/24/18 0322 07/25/18 0251  WBC 10.7* 10.2  HGB 13.6 13.4  HCT 41.5 40.2  PLT 143* 131*   Recent Labs    07/25/18 0251  NA 138  K 3.6  CL 102  CO2 29  GLUCOSE 108*  BUN 22  CREATININE 0.78  CALCIUM 8.9    Medications:  I have reviewed the patient's current medications. Scheduled: . arformoterol  15 mcg Nebulization BID  . budesonide (PULMICORT) nebulizer solution  0.25 mg Nebulization BID  . chlorhexidine  15 mL Mouth Rinse BID  . enoxaparin (LOVENOX) injection  40 mg Subcutaneous Q24H  . ipratropium  0.5 mg Nebulization TID  . mouth rinse  15 mL Mouth Rinse q12n4p  . methadone  80 mg Oral Daily  . nicotine  21 mg Transdermal Daily   Continuous: . feeding supplement (VITAL AF 1.2 CAL) 1,000 mL (07/26/18 0120)   IOM:[id number], albuterol, ondansetron (ZOFRAN)  IV  Assessment/Plan: POD #5s/p trach, direct laryngoscopy and biopsy. 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 - Speaking well with the PMV. Trach changed to an uncuffed #6 Shiley. -NPO per speech pathology. Tube feed for now. Consider re-evaluation since he now has an uncuffed trach. - Awaiting oncology eval, will likely need XRT with or without chemo.   LOS: 5 days   Kobi Aller W Shaymus Eveleth 07/26/2018, 8:51 AM

## 2018-07-26 NOTE — Consult Note (Signed)
[place]  [phone] Fax:([phone]) [phone]   MEDICAL ONCOLOGY - INITIAL CONSULTATION  Referral MD: Dr. 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 Houseman is a 63 year old male with a past medical history including substance abuse and currently on methadone, emphysema, and bronchitis.  The patient was undergoing outpatient evaluation for lung nodules and a vocal cord lesion and presented to the emergency room with worsening dyspnea.  ENT was consulted for his vocal cord mass.  Due to the location of the laryngeal mass, the patient was in danger of developing an upper airway obstruction.  A tracheostomy was placed to protect his airway.  A biopsy of the mass was performed.  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  The patient tells me that he was having persistent upper respiratory symptoms with dyspnea and cough since about November 2019.  He was on several rounds of antibiotics and steroids without significant improvement.  He reports a 67-month history of hoarseness in his voice.  He has lost about 40 pounds over the past 6 months.  He also reports having a sore throat.  Denies difficulty swallowing to admission.  Breathing is better since placement of tracheostomy.  He has not had any fevers or chills.  Denies chest pain.  Denies nausea, vomiting, constipation, diarrhea.  He is on NG tube feeding at this time.  Working with speech therapy on swallowing.  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  The patient is widowed and currently lives alone.  Wife died about 15 months ago secondary to lung cancer. Upon discharge, he plans to go live with 1 of his daughters in [place], [place].  He has 3 daughters who live locally.  1 of his daughters is an Therapist, sports who  works on the oncology unit at [employer].   Past Medical History:  Diagnosis Date  . Bronchitis 03/2018  . Herpes   . Lung nodule 2020  . Substance abuse ([place])    opiate addiction, been on methadone for 11 years  :  Past Surgical History:  Procedure Laterality Date  . DIRECT LARYNGOSCOPY N/A 07/21/2018   Procedure: DIRECT LARYNGOSCOPY, TRACHEOSTOMY, BIOPSY;  Surgeon: Leta Baptist, MD;  Location: [facility] OR;  Service: ENT;  Laterality: N/A;  :  Current Facility-Administered Medications  Medication Dose Route Frequency Provider Last Rate Last Dose  . acetaminophen (TYLENOL) tablet 650 mg  650 mg Per Tube Q4H PRN Cherene Altes, MD   650 mg at 07/25/18 1727  . albuterol (PROVENTIL) (2.5 MG/3ML) 0.083% nebulizer solution 2.5 mg  2.5 mg Nebulization Q2H PRN Milagros Loll, MD      . arformoterol [facility]) nebulizer solution 15 mcg  15 mcg Nebulization BID Milagros Loll, MD   15 mcg at 07/26/18 0827  . budesonide (PULMICORT) nebulizer solution 0.25 mg  0.25 mg Nebulization BID Jacques Earthly T, MD   0.25 mg at 07/26/18 0827  . chlorhexidine (PERIDEX) 0.12 % solution 15 mL  15 mL Mouth Rinse BID Jacques Earthly T, MD   15 mL at 07/26/18 1022  . enoxaparin (LOVENOX) injection 40 mg  40 mg Subcutaneous Q24H Milagros Loll, MD   40 mg at 07/25/18 1721  . feeding supplement (VITAL AF 1.2 CAL) liquid 1,000 mL  1,000 mL Per Tube Continuous Cherene Altes,  MD 70 mL/hr at 07/26/18 0120 1,000 mL at 07/26/18 0120  . ipratropium (ATROVENT) nebulizer solution 0.5 mg  0.5 mg Nebulization TID Jacques Earthly T, MD   0.5 mg at 07/26/18 0827  . MEDLINE mouth rinse  15 mL Mouth Rinse q12n4p Milagros Loll, MD   15 mL at 07/24/18 1546  . methadone (DOLOPHINE) tablet 80 mg  80 mg Oral Daily Cherene Altes, MD   80 mg at 07/26/18 1023  . nicotine (NICODERM CQ - dosed in mg/24 hours) patch 21 mg  21 mg Transdermal Daily Cherene Altes, MD   21 mg at 07/26/18 1024  . ondansetron  (ZOFRAN) injection 4 mg  4 mg Intravenous Q6H PRN Milagros Loll, MD         No Known Allergies:  Family History  Problem Relation Age of Onset  . Alcohol abuse Father   . Cancer Father   . Cirrhosis Father   . Drug abuse Brother   :  Social History   Socioeconomic History  . Marital status: Widowed    Spouse name: Not on file  . Number of children: Not on file  . Years of education: Not on file  . Highest education level: Not on file  Occupational History  . Not on file  Social Needs  . Financial resource strain: Not on file  . Food insecurity:    Worry: Not on file    Inability: Not on file  . Transportation needs:    Medical: Not on file    Non-medical: Not on file  Tobacco Use  . Smoking status: Current Every Day Smoker    Packs/day: 2.00    Years: 50.00    Pack years: 100.00    Types: Cigarettes  . Smokeless tobacco: Never Used  . Tobacco comment: 07/06/18 at 10 cigs per day  Substance and Sexual Activity  . Alcohol use: Not Currently  . Drug use: Not Currently  . Sexual activity: Not Currently    Birth control/protection: None  Lifestyle  . Physical activity:    Days per week: Not on file    Minutes per session: Not on file  . Stress: Not on file  Relationships  . Social connections:    Talks on phone: Not on file    Gets together: Not on file    Attends religious service: Not on file    Active member of club or organization: Not on file    Attends meetings of clubs or organizations: Not on file    Relationship status: Not on file  . Intimate partner violence:    Fear of current or ex partner: Not on file    Emotionally abused: Not on file    Physically abused: Not on file    Forced sexual activity: Not on file  Other Topics Concern  . Not on file  Social History Narrative  . Not on file   Review of Systems: Constitutional: positive for fatigue and weight loss, negative for chills, fevers and night sweats Eyes: negative Ears, nose, mouth,  throat, and face: negative Respiratory: positive for chronic bronchitis, cough and dyspnea on exertion, negative for hemoptysis and wheezing Cardiovascular: negative Gastrointestinal: negative Genitourinary:negative Hematologic/lymphatic: negative Musculoskeletal:negative Neurological: negative Behavioral/Psych: negative Endocrine: negative Allergic/Immunologic: negative  Exam: Patient Vitals for the past 24 hrs:  BP Temp Temp src Pulse Resp SpO2 Weight  07/26/18 1139 (!) 96/55 97.8 F (36.6 C) Oral (!) 51 10 97 % -  07/26/18 1114 - - -  70 16 95 % -  07/26/18 0842 - - - 76 19 100 % -  07/26/18 0828 - - - - - 100 % -  07/26/18 0827 - - - - - 98 % -  07/26/18 0735 (!) 118/38 98.3 F (36.8 C) Oral 64 10 98 % -  07/26/18 0500 - - - - - - 147 lb 14.9 oz (67.1 kg)  07/26/18 0419 (!) 113/52 98.2 F (36.8 C) Oral 62 18 98 % -  07/26/18 0310 - - - 99 17 100 % -  07/26/18 0023 - - - (!) 58 14 96 % -  07/25/18 2308 (!) 115/55 98.1 F (36.7 C) Oral (!) 59 16 100 % -  07/25/18 2002 (!) 145/47 - - 61 15 100 % -  07/25/18 1957 - - - - - 100 % -  07/25/18 1930 105/63 98.8 F (37.1 C) Oral (!) 59 15 100 % -  07/25/18 1635 112/69 (!) 97.5 F (36.4 C) Oral 66 12 100 % -  07/25/18 1556 - - - - - 98 % -  07/25/18 1430 - - - - - 100 % -    General: Thin male who is in no acute distress.  Eyes:  no scleral icterus.  ENT: No thrush.  Tracheostomy is midline.  Lymphatics:  Negative cervical, supraclavicular or axillary adenopathy.  Respiratory: Lungs with crackles in the bases. Cardiovascular:  Regular rate and rhythm, S1/S2, without murmur, rub or gallop.  There was no pedal edema.  GI:  abdomen was soft, flat, nontender, nondistended, without organomegaly.  Muscoloskeletal:  no spinal tenderness of palpation of vertebral spine.  Skin exam was without echymosis, petichae.  Neuro exam was nonfocal. Patient was alert and oriented.  Attention was good.   Language was appropriate.  Mood was normal  without depression.  Speech was not pressured.  Thought content was not tangential.     Lab Results  Component Value Date   WBC 10.2 07/25/2018   HGB 13.4 07/25/2018   HCT 40.2 07/25/2018   PLT 131 (L) 07/25/2018   GLUCOSE 108 (H) 07/25/2018   CHOL 173 06/14/2018   TRIG 123 06/14/2018   HDL 46 06/14/2018   LDLCALC 102 (H) 06/14/2018   ALT 13 07/25/2018   AST 14 (L) 07/25/2018   NA 138 07/25/2018   K 3.6 07/25/2018   CL 102 07/25/2018   CREATININE 0.78 07/25/2018   BUN 22 07/25/2018   CO2 29 07/25/2018    Ct Chest Wo Contrast  Result Date: 07/03/2018 CLINICAL DATA:  Shortness of breath, hoarseness EXAM: CT CHEST WITHOUT CONTRAST TECHNIQUE: Multidetector CT imaging of the chest was performed following the standard protocol without IV contrast. COMPARISON:  Chest radiograph, 06/14/2018 FINDINGS: Cardiovascular: Three-vessel coronary artery calcifications. Normal heart size. Trace pericardial effusion. Mediastinum/Nodes: No enlarged mediastinal, hilar, or axillary lymph nodes. Frothy secretions in the trachea. Thyroid gland and esophagus demonstrate no significant findings. Lungs/Pleura: Mild centrilobular emphysema and hyperinflation. There is clustered fine nodular and irregular opacity predominantly involving medial lower lobes, central middle lobe, and lingula (series 4, image 112). There is a 1.0 cm pulmonary nodule of the most anterior inferior right middle lobe (series 4, image 137). There is an additional irregular elongated nodule of the left lower lobe measuring 1.1 x 0.5 cm (series 4, image 121) and an adjacent irregular 5 mm nodule (series 4, image 125). There are additional, very fine, tiny centrilobular nodules most conspicuous in the upper lobes. No pleural effusion or pneumothorax. Upper  Abdomen: No acute abnormality. Musculoskeletal: No chest wall mass or suspicious bone lesions identified. IMPRESSION: 1. There is clustered fine nodular and irregular opacity predominantly  involving medial lower lobes, central middle lobe, and lingula (series 4, image 112). Findings are most consistent with atypical infection, including atypical mycobacterium. There are additional, very fine, tiny centrilobular nodules most conspicuous in the upper lobes, which likely reflect smoking related respiratory bronchiolitis. 2. There is a 1.0 cm pulmonary nodule of the most anterior inferior right middle lobe (series 4, image 137). There is an additional irregular elongated nodule of the left lower lobe measuring 1.1 x 0.5 cm (series 4, image 121) and an adjacent irregular 5 mm nodule (series 4, image 125). The right middle lobe pulmonary nodule is most suspicious for malignancy although these nodules are indeterminate. Consider PET-CT, percutaneous CT-guided biopsy, or at minimum initial follow-up CT in 3 months to establish stability or resolution. 3.  Emphysema. 4.  Coronary artery disease. These results will be called to the ordering clinician or representative by the Radiologist Assistant, and communication documented in the PACS or zVision Dashboard. Electronically Signed   By: Eddie Candle M.D.   On: 07/03/2018 09:08   Nm Pet Image Initial (pi) Skull Base To Thigh  Result Date: 07/17/2018 CLINICAL DATA:  Initial treatment strategy for pulmonary nodule. EXAM: NUCLEAR MEDICINE PET SKULL BASE TO THIGH TECHNIQUE: 7.7 mCi F-18 FDG was injected intravenously. Full-ring PET imaging was performed from the skull base to thigh after the radiotracer. CT data was obtained and used for attenuation correction and anatomic localization. Fasting blood glucose: 91 mg/dl COMPARISON:  CT chest 07/03/2018 FINDINGS: Mediastinal blood pool activity: SUV max 2.06 NECK: There is asymmetric soft tissue swelling involving the left vocal cord. Corresponding asymmetric increased uptake has an SUV max of 14.16. No hypermetabolic cervical lymph nodes. Incidental CT findings: none CHEST: No hypermetabolic axillary,  supraclavicular, mediastinal or hilar lymph nodes. The pulmonary nodule the pulmonary nodule within the right middle lobe is again noted. This measures 1.1 cm on the current exam and has an SUV max 1.13. Adjacent ground-glass attenuation and scarring is identified reflecting postinflammatory changes. The right lower lobe lung nodule described on previous exam measures 9 x 5 mm with an equivalent diameter of 7 mm, which is largely too small to reliably characterize by PET-CT. The SUV max is equal to 0.6. Small cluster of tree-in-bud nodules within the medial right lung base noted compatible with inflammatory/infectious bronchiolitis. Incidental CT findings: Moderate changes of emphysema. Bubbly debris within the right mainstem bronchus is identified which may reflect aspirated material. Bronchial wall thickening is identified bilaterally. Aortic atherosclerosis and 3 vessel coronary artery calcifications. ABDOMEN/PELVIS: No abnormal radiotracer activity identified within the liver, gallbladder, pancreas, or spleen. No abnormal uptake within the adrenal glands. Incidental CT findings: Aortic atherosclerosis with branch vessel disease. SKELETON: No focal hypermetabolic activity to suggest skeletal metastasis. Incidental CT findings: none IMPRESSION: 1. There is no significant radiotracer uptake associated with the right middle lobe and left lower lobe lung nodules. (Note: The left lower lobe lung nodule is largely too small to reliably characterize by PET-CT but has a benign appearing morphology). Findings favor of benign etiology. Recommend repeat CT of the chest in 3 months to ensure stability of these nodules as certain low-grade pulmonary neoplasms may exhibit low level FDG uptake on PET-CT. 2. Additional small scattered nodules have a postinflammatory/infectious appearance. 3. There is abnormal increased soft tissue and hypermetabolism associated with the left local cord. In a  patient who may be at risk for head  neck cancer further investigation with contrast enhanced CT or MRI of the soft tissues of the neck is advised. Additionally, direct visualization with flexible laryngoscopy may be helpful. 4. Emphysema and diffuse bronchial wall thickening compatible with COPD. 5. Aortic atherosclerosis with 3 vessel coronary artery atherosclerotic calcifications. Aortic Atherosclerosis (ICD10-I70.0) and Emphysema (ICD10-J43.9). Electronically Signed   By: Kerby Moors M.D.   On: 07/17/2018 11:24   Dg Chest Port 1 View  Result Date: 07/21/2018 CLINICAL DATA:  Shortness of breath. EXAM: PORTABLE CHEST 1 VIEW COMPARISON:  Chest CT 07/03/2018 FINDINGS: The heart is normal in size. The mediastinal and hilar contours appear normal. Emphysematous changes and hyperinflation but no definite acute overlying pulmonary process. No pleural effusions. The bony thorax is intact. IMPRESSION: Emphysematous changes and pulmonary scarring but no acute overlying pulmonary process. Electronically Signed   By: Marijo Sanes M.D.   On: 07/21/2018 20:44   Dg Swallowing Func-speech Pathology  Result Date: 07/23/2018 Objective Swallowing Evaluation: Type of Study: MBS-Modified Barium Swallow Study  Patient Details Name: Javion Holmer MRN: [id number] Date of Birth: [birth year] Today's Date: 07/23/2018 Time: SLP Start Time (ACUTE ONLY): 8144 -SLP Stop Time (ACUTE ONLY): 1028 SLP Time Calculation (min) (ACUTE ONLY): 14 min Past Medical History: Past Medical History: Diagnosis Date . Bronchitis 03/2018 . Herpes  . Lung nodule 2020 . Substance abuse ([place])   opiate addiction, been on methadone for 11 years Past Surgical History: No past surgical history on file. HPI: Pt is a 63 year old man recently found to have lung nodules and L vocal cord lesion presenting with worsening dyspnea, s/p trach 2/29. 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, emphysema, chronic bronchitis  Subjective: pt alert, asking about getting his methadone Assessment / Plan / Recommendation CHL IP CLINICAL IMPRESSIONS 07/23/2018 Clinical Impression Pt has a moderate-severe pharyngeal dysphagia felt to be largely related to presence of glottic mass and therefore has likely been present PTA. He has minimal hyolaryngeal movement and epiglottic inversion. His base of tongue retraction, pharyngeal squeeze, and UES opening are also reduced. He does not achieve full laryngeal vestibule closure with thin and nectar thick liquids entering the airway before the swallow. He does not trigger a cough during aspiration events, but does have some delayed throat clearing that does not eject aspirates. Cued coughing with PMV in place is not entirely effective, particularly when larger volumes are aspirated. Pt has limited bolus entry into the UES, leaving severe residue behind even with nectar thick liquids. Compensatory strategies for clearance and airway protection were not effective, therefore thicker consistencies were not attempted. Recommend that he remain NPO with additional exercises and therapeutic ice chips with SLP to utilize his swallowing musculature, particularly since he says he may be undergoing XRT. Would consider alternative sources for medication administration/nutrition. SLP Visit Diagnosis Dysphagia, pharyngoesophageal phase (R13.14) Attention and concentration deficit following -- Frontal lobe and executive function deficit following -- Impact on safety and function Severe aspiration risk;Risk for inadequate nutrition/hydration   CHL IP TREATMENT RECOMMENDATION 07/23/2018 Treatment Recommendations Therapy as outlined in treatment plan below   Prognosis 07/23/2018 Prognosis for Safe Diet Advancement Fair Barriers to Reach Goals Severity of deficits Barriers/Prognosis Comment -- CHL IP DIET RECOMMENDATION  07/23/2018 SLP Diet Recommendations NPO Liquid Administration via -- Medication Administration  Via alternative means Compensations -- Postural Changes --   CHL IP OTHER RECOMMENDATIONS 07/23/2018 Recommended Consults -- Oral Care Recommendations Oral care QID Other Recommendations Have oral suction available   CHL IP FOLLOW UP RECOMMENDATIONS 07/23/2018 Follow up Recommendations (No Data)   CHL IP FREQUENCY AND DURATION 07/23/2018 Speech Therapy Frequency (ACUTE ONLY) min 2x/week Treatment Duration 2 weeks      CHL IP ORAL PHASE 07/23/2018 Oral Phase WFL Oral - Pudding Teaspoon -- Oral - Pudding Cup -- Oral - Honey Teaspoon -- Oral - Honey Cup -- Oral - Nectar Teaspoon -- Oral - Nectar Cup -- Oral - Nectar Straw -- Oral - Thin Teaspoon -- Oral - Thin Cup -- Oral - Thin Straw -- Oral - Puree -- Oral - Mech Soft -- Oral - Regular -- Oral - Multi-Consistency -- Oral - Pill -- Oral Phase - Comment --  CHL IP PHARYNGEAL PHASE 07/23/2018 Pharyngeal Phase Impaired Pharyngeal- Pudding Teaspoon -- Pharyngeal -- Pharyngeal- Pudding Cup -- Pharyngeal -- Pharyngeal- Honey Teaspoon -- Pharyngeal -- Pharyngeal- Honey Cup -- Pharyngeal -- Pharyngeal- Nectar Teaspoon Reduced pharyngeal peristalsis;Reduced epiglottic inversion;Reduced anterior laryngeal mobility;Reduced laryngeal elevation;Reduced airway/laryngeal closure;Reduced tongue base retraction;Penetration/Aspiration before swallow;Penetration/Apiration after swallow;Pharyngeal residue - valleculae;Pharyngeal residue - pyriform Pharyngeal Material enters airway, passes BELOW cords without attempt by patient to eject out (silent aspiration) Pharyngeal- Nectar Cup -- Pharyngeal -- Pharyngeal- Nectar Straw -- Pharyngeal -- Pharyngeal- Thin Teaspoon Reduced pharyngeal peristalsis;Reduced epiglottic inversion;Reduced anterior laryngeal mobility;Reduced laryngeal elevation;Reduced airway/laryngeal closure;Reduced tongue base retraction;Penetration/Aspiration before  swallow;Penetration/Apiration after swallow;Pharyngeal residue - valleculae;Pharyngeal residue - pyriform Pharyngeal Material enters airway, passes BELOW cords without attempt by patient to eject out (silent aspiration) Pharyngeal- Thin Cup -- Pharyngeal -- Pharyngeal- Thin Straw -- Pharyngeal -- Pharyngeal- Puree -- Pharyngeal -- Pharyngeal- Mechanical Soft -- Pharyngeal -- Pharyngeal- Regular -- Pharyngeal -- Pharyngeal- Multi-consistency -- Pharyngeal -- Pharyngeal- Pill -- Pharyngeal -- Pharyngeal Comment --  CHL IP CERVICAL ESOPHAGEAL PHASE 07/23/2018 Cervical Esophageal Phase Impaired Pudding Teaspoon -- Pudding Cup -- Honey Teaspoon -- Honey Cup -- Nectar Teaspoon Reduced cricopharyngeal relaxation Nectar Cup -- Nectar Straw -- Thin Teaspoon Reduced cricopharyngeal relaxation Thin Cup -- Thin Straw -- Puree -- Mechanical Soft -- Regular -- Multi-consistency -- Pill -- Cervical Esophageal Comment -- Venita Sheffield Nix 07/23/2018, 11:17 AM  Pollyann Glen, M.A. CCC-SLP Acute Rehabilitation Services Pager [phone] Office [phone]              Ct Chest Wo Contrast  Result Date: 07/03/2018 CLINICAL DATA:  Shortness of breath, hoarseness EXAM: CT CHEST WITHOUT CONTRAST TECHNIQUE: Multidetector CT imaging of the chest was performed following the standard protocol without IV contrast. COMPARISON:  Chest radiograph, 06/14/2018 FINDINGS: Cardiovascular: Three-vessel coronary artery calcifications. Normal heart size. Trace pericardial effusion. Mediastinum/Nodes: No enlarged mediastinal, hilar, or axillary lymph nodes. Frothy secretions in the trachea. Thyroid gland and esophagus demonstrate no significant findings. Lungs/Pleura: Mild centrilobular emphysema and hyperinflation. There is clustered fine nodular and irregular opacity predominantly involving medial lower lobes, central middle lobe, and lingula (series 4, image 112). There is a 1.0 cm pulmonary nodule of the most anterior inferior right middle lobe (series 4,  image 137). There is an additional irregular elongated nodule of the left lower lobe measuring 1.1 x 0.5 cm (series 4, image 121) and an adjacent irregular 5 mm nodule (series 4, image 125). There are additional, very fine, tiny centrilobular nodules most conspicuous in the upper lobes. No pleural effusion or pneumothorax. Upper Abdomen: No acute abnormality. Musculoskeletal: No chest wall  mass or suspicious bone lesions identified. IMPRESSION: 1. There is clustered fine nodular and irregular opacity predominantly involving medial lower lobes, central middle lobe, and lingula (series 4, image 112). Findings are most consistent with atypical infection, including atypical mycobacterium. There are additional, very fine, tiny centrilobular nodules most conspicuous in the upper lobes, which likely reflect smoking related respiratory bronchiolitis. 2. There is a 1.0 cm pulmonary nodule of the most anterior inferior right middle lobe (series 4, image 137). There is an additional irregular elongated nodule of the left lower lobe measuring 1.1 x 0.5 cm (series 4, image 121) and an adjacent irregular 5 mm nodule (series 4, image 125). The right middle lobe pulmonary nodule is most suspicious for malignancy although these nodules are indeterminate. Consider PET-CT, percutaneous CT-guided biopsy, or at minimum initial follow-up CT in 3 months to establish stability or resolution. 3.  Emphysema. 4.  Coronary artery disease. These results will be called to the ordering clinician or representative by the Radiologist Assistant, and communication documented in the PACS or zVision Dashboard. Electronically Signed   By: Eddie Candle M.D.   On: 07/03/2018 09:08   Nm Pet Image Initial (pi) Skull Base To Thigh  Result Date: 07/17/2018 CLINICAL DATA:  Initial treatment strategy for pulmonary nodule. EXAM: NUCLEAR MEDICINE PET SKULL BASE TO THIGH TECHNIQUE: 7.7 mCi F-18 FDG was injected intravenously. Full-ring PET imaging was  performed from the skull base to thigh after the radiotracer. CT data was obtained and used for attenuation correction and anatomic localization. Fasting blood glucose: 91 mg/dl COMPARISON:  CT chest 07/03/2018 FINDINGS: Mediastinal blood pool activity: SUV max 2.06 NECK: There is asymmetric soft tissue swelling involving the left vocal cord. Corresponding asymmetric increased uptake has an SUV max of 14.16. No hypermetabolic cervical lymph nodes. Incidental CT findings: none CHEST: No hypermetabolic axillary, supraclavicular, mediastinal or hilar lymph nodes. The pulmonary nodule the pulmonary nodule within the right middle lobe is again noted. This measures 1.1 cm on the current exam and has an SUV max 1.13. Adjacent ground-glass attenuation and scarring is identified reflecting postinflammatory changes. The right lower lobe lung nodule described on previous exam measures 9 x 5 mm with an equivalent diameter of 7 mm, which is largely too small to reliably characterize by PET-CT. The SUV max is equal to 0.6. Small cluster of tree-in-bud nodules within the medial right lung base noted compatible with inflammatory/infectious bronchiolitis. Incidental CT findings: Moderate changes of emphysema. Bubbly debris within the right mainstem bronchus is identified which may reflect aspirated material. Bronchial wall thickening is identified bilaterally. Aortic atherosclerosis and 3 vessel coronary artery calcifications. ABDOMEN/PELVIS: No abnormal radiotracer activity identified within the liver, gallbladder, pancreas, or spleen. No abnormal uptake within the adrenal glands. Incidental CT findings: Aortic atherosclerosis with branch vessel disease. SKELETON: No focal hypermetabolic activity to suggest skeletal metastasis. Incidental CT findings: none IMPRESSION: 1. There is no significant radiotracer uptake associated with the right middle lobe and left lower lobe lung nodules. (Note: The left lower lobe lung nodule is  largely too small to reliably characterize by PET-CT but has a benign appearing morphology). Findings favor of benign etiology. Recommend repeat CT of the chest in 3 months to ensure stability of these nodules as certain low-grade pulmonary neoplasms may exhibit low level FDG uptake on PET-CT. 2. Additional small scattered nodules have a postinflammatory/infectious appearance. 3. There is abnormal increased soft tissue and hypermetabolism associated with the left local cord. In a patient who may be at risk for head  neck cancer further investigation with contrast enhanced CT or MRI of the soft tissues of the neck is advised. Additionally, direct visualization with flexible laryngoscopy may be helpful. 4. Emphysema and diffuse bronchial wall thickening compatible with COPD. 5. Aortic atherosclerosis with 3 vessel coronary artery atherosclerotic calcifications. Aortic Atherosclerosis (ICD10-I70.0) and Emphysema (ICD10-J43.9). Electronically Signed   By: Kerby Moors M.D.   On: 07/17/2018 11:24   Dg Chest Port 1 View  Result Date: 07/21/2018 CLINICAL DATA:  Shortness of breath. EXAM: PORTABLE CHEST 1 VIEW COMPARISON:  Chest CT 07/03/2018 FINDINGS: The heart is normal in size. The mediastinal and hilar contours appear normal. Emphysematous changes and hyperinflation but no definite acute overlying pulmonary process. No pleural effusions. The bony thorax is intact. IMPRESSION: Emphysematous changes and pulmonary scarring but no acute overlying pulmonary process. Electronically Signed   By: Marijo Sanes M.D.   On: 07/21/2018 20:44   Dg Swallowing Func-speech Pathology  Result Date: 07/23/2018 Objective Swallowing Evaluation: Type of Study: MBS-Modified Barium Swallow Study  Patient Details Name: Monnie Anspach MRN: [id number] Date of Birth: 1955/06/22 Today's Date: 07/23/2018 Time: SLP Start Time (ACUTE ONLY): 5056 -SLP Stop Time (ACUTE ONLY): 1028 SLP Time Calculation (min) (ACUTE ONLY): 14 min Past Medical  History: Past Medical History: Diagnosis Date . Bronchitis 03/2018 . Herpes  . Lung nodule 2020 . Substance abuse ([place])   opiate addiction, been on methadone for 11 years Past Surgical History: No past surgical history on file. HPI: Pt is a 63 year old man recently found to have lung nodules and L vocal cord lesion presenting with worsening dyspnea, s/p trach 2/29. 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, emphysema, chronic bronchitis  Subjective: pt alert, asking about getting his methadone Assessment / Plan / Recommendation CHL IP CLINICAL IMPRESSIONS 07/23/2018 Clinical Impression Pt has a moderate-severe pharyngeal dysphagia felt to be largely related to presence of glottic mass and therefore has likely been present PTA. He has minimal hyolaryngeal movement and epiglottic inversion. His base of tongue retraction, pharyngeal squeeze, and UES opening are also reduced. He does not achieve full laryngeal vestibule closure with thin and nectar thick liquids entering the airway before the swallow. He does not trigger a cough during aspiration events, but does have some delayed throat clearing that does not eject aspirates. Cued coughing with PMV in place is not entirely effective, particularly when larger volumes are aspirated. Pt has limited bolus entry into the UES, leaving severe residue behind even with nectar thick liquids. Compensatory strategies for clearance and airway protection were not effective, therefore thicker consistencies were not attempted. Recommend that he remain NPO with additional exercises and therapeutic ice chips with SLP to utilize his swallowing musculature, particularly since he says he may be undergoing XRT. Would consider alternative sources for medication administration/nutrition. SLP Visit Diagnosis  Dysphagia, pharyngoesophageal phase (R13.14) Attention and concentration deficit following -- Frontal lobe and executive function deficit following -- Impact on safety and function Severe aspiration risk;Risk for inadequate nutrition/hydration   CHL IP TREATMENT RECOMMENDATION 07/23/2018 Treatment Recommendations Therapy as outlined in treatment plan below   Prognosis 07/23/2018 Prognosis for Safe Diet Advancement Fair Barriers to Reach Goals Severity of deficits Barriers/Prognosis Comment -- CHL IP DIET RECOMMENDATION 07/23/2018 SLP Diet Recommendations NPO Liquid Administration via -- Medication Administration Via alternative means Compensations -- Postural Changes --  CHL IP OTHER RECOMMENDATIONS 07/23/2018 Recommended Consults -- Oral Care Recommendations Oral care QID Other Recommendations Have oral suction available   CHL IP FOLLOW UP RECOMMENDATIONS 07/23/2018 Follow up Recommendations (No Data)   CHL IP FREQUENCY AND DURATION 07/23/2018 Speech Therapy Frequency (ACUTE ONLY) min 2x/week Treatment Duration 2 weeks      CHL IP ORAL PHASE 07/23/2018 Oral Phase WFL Oral - Pudding Teaspoon -- Oral - Pudding Cup -- Oral - Honey Teaspoon -- Oral - Honey Cup -- Oral - Nectar Teaspoon -- Oral - Nectar Cup -- Oral - Nectar Straw -- Oral - Thin Teaspoon -- Oral - Thin Cup -- Oral - Thin Straw -- Oral - Puree -- Oral - Mech Soft -- Oral - Regular -- Oral - Multi-Consistency -- Oral - Pill -- Oral Phase - Comment --  CHL IP PHARYNGEAL PHASE 07/23/2018 Pharyngeal Phase Impaired Pharyngeal- Pudding Teaspoon -- Pharyngeal -- Pharyngeal- Pudding Cup -- Pharyngeal -- Pharyngeal- Honey Teaspoon -- Pharyngeal -- Pharyngeal- Honey Cup -- Pharyngeal -- Pharyngeal- Nectar Teaspoon Reduced pharyngeal peristalsis;Reduced epiglottic inversion;Reduced anterior laryngeal mobility;Reduced laryngeal elevation;Reduced airway/laryngeal closure;Reduced tongue base retraction;Penetration/Aspiration before swallow;Penetration/Apiration after  swallow;Pharyngeal residue - valleculae;Pharyngeal residue - pyriform Pharyngeal Material enters airway, passes BELOW cords without attempt by patient to eject out (silent aspiration) Pharyngeal- Nectar Cup -- Pharyngeal -- Pharyngeal- Nectar Straw -- Pharyngeal -- Pharyngeal- Thin Teaspoon Reduced pharyngeal peristalsis;Reduced epiglottic inversion;Reduced anterior laryngeal mobility;Reduced laryngeal elevation;Reduced airway/laryngeal closure;Reduced tongue base retraction;Penetration/Aspiration before swallow;Penetration/Apiration after swallow;Pharyngeal residue - valleculae;Pharyngeal residue - pyriform Pharyngeal Material enters airway, passes BELOW cords without attempt by patient to eject out (silent aspiration) Pharyngeal- Thin Cup -- Pharyngeal -- Pharyngeal- Thin Straw -- Pharyngeal -- Pharyngeal- Puree -- Pharyngeal -- Pharyngeal- Mechanical Soft -- Pharyngeal -- Pharyngeal- Regular -- Pharyngeal -- Pharyngeal- Multi-consistency -- Pharyngeal -- Pharyngeal- Pill -- Pharyngeal -- Pharyngeal Comment --  CHL IP CERVICAL ESOPHAGEAL PHASE 07/23/2018 Cervical Esophageal Phase Impaired Pudding Teaspoon -- Pudding Cup -- Honey Teaspoon -- Honey Cup -- Nectar Teaspoon Reduced cricopharyngeal relaxation Nectar Cup -- Nectar Straw -- Thin Teaspoon Reduced cricopharyngeal relaxation Thin Cup -- Thin Straw -- Puree -- Mechanical Soft -- Regular -- Multi-consistency -- Pill -- Cervical Esophageal Comment -- Venita Sheffield Nix 07/23/2018, 11:17 AM  Pollyann Glen, M.A. [facility] Services Pager [phone] Office [phone]             PATHOLOGY: Diagnosis Vocal cord, biopsy, Left - INVASIVE SQUAMOUS CELL CARCINOMA. Microscopic Comment Dr. Vic Ripper agrees. Called to Dr. Benjamine Mola on 07/24/2018. (JDP:kh 07/24/2018) Claudette Laws MD Pathologist, Electronic Signature (Case signed 07/24/2018)  Assessment and Plan:  This is a 63 year old male with:  1.  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  Review of the operative report indicates this is likely a stage T3 tumor.  The patient has a previous CT scan without contrast performed on 07/03/2018 which indicated there was a 1.0 cm pulmonary nodule in the most anterior inferior right middle lobe as well as an additional irregular elongated nodule of the left lower lobe measuring 1.1 x 0.5 cm and an adjacent irregular 5 mm nodule.  The right middle lobe pulmonary nodule was suspicious for malignancy although the nodules were indeterminate.  PET scan performed on 07/17/2018 showed no significant radiotracer uptake associated with the right middle lobe and left lower lobe lung nodules.  Findings favored benign etiology but a repeat CT scan of the chest in 3 months was recommended to ensure stability.  I talked with  the patient and his daughter about his diagnosis and treatment options including radiation combined with chemotherapy.  We discussed that treatment would begin on outpatient basis.  He will be seen by Dr. Irene Limbo later today.  2.  Thrombocytopenia.  This is very mild.  Will continue to monitor.  3.  Emphysema and chronic bronchitis.  Smoking cessation was discussed with the patient and his daughter.  He does not plan to continue smoking upon discharge.  Continue management per pulmonology.   Thank you for this referral.   Mikey Bussing, DNP, AGPCNP-BC, AOCNP

## 2018-07-26 NOTE — Progress Notes (Addendum)
  Speech Language Pathology Treatment: Dysphagia;Passy Muir Speaking valve  Patient Details Name: Ronald Ruiz MRN: [id number] DOB: 12/06/1955 Today's Date: 07/26/2018 Time: 1430-1510 SLP Time Calculation (min) (ACUTE ONLY): 40 min  Assessment / Plan / Recommendation Clinical Impression  Pt seen for dysphagia and PMV treatment.  His daughter was present for session. Trach changed today to #6 cuffless.  Initial part of session spent reviewing the purpose/function of PMV, its precautions, proper cleaning.  Demonstrated placement/removal and pt practiced several times using mirror. Vocal quality improved today.  VS remained stable throughout 40 minute use.  Passy-Muir website shared with pt, daughter so they may obtain more information.  Pt able to verbalize precautions, function of valve, and demonstrate placement/removal independently.  We spent time discussing swallowing, the long-term potential impact of radiation on swallowing musculature, and the importance of daily exercises/stretching of neck/jaw/swallowing musculature to help stave of radiation-induced dysphagia.  He understands the importance of f/u with an SLP to establish a home exercise program.  We discussed the benefits vs burdens of PEG.  Pt is still deciding but is leaning towards moving forward with PEG.  Pt is hopeful that his swallowing has improved, even marginally, since 3/1 MBS. Given presence of mass, significant change is unlikely, but after further discussion, we agreed to repeat MBS next date to assess changes and help Ronald Ruiz and his daughter with decision-making.  Trials of ice chips continue to elicit throat-clearing, cough post-swallow, which indicate likely ongoing laryngeal penetration at least.  However, would allow pt occasional ice chips throughout the day and after oral care.  Support provided. Ronald Ruiz and his daughter had no further questions; they agree with plan.   HPI HPI: Pt is a 63 year old man  recently found to have lung nodules and L vocal cord lesion presenting with worsening dyspnea, s/p trach 2/29. 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, emphysema, chronic bronchitis      SLP Plan  MBS  Next date      Recommendations  Medication Administration: Via alternative means      Patient may use Passy-Muir Speech Valve: During all waking hours (remove during sleep) PMSV Supervision: Intermittent         Oral Care Recommendations: Oral care QID Follow up Recommendations: Outpatient SLP SLP Visit Diagnosis: Dysphagia, pharyngoesophageal phase (R13.14);[place]phonia (R49.1) Plan: MBS       GO              [name] L. Tivis Ringer, [place] CCC/SLP Acute Rehabilitation Services Office number [phone] Pager [phone]   [name] 07/26/2018, 3:12 PM

## 2018-07-26 NOTE — Progress Notes (Signed)
Physical Therapy Treatment Patient Details Name: Ronald Ruiz MRN: [id number] DOB: 02-01-1956 Today's Date: 07/26/2018    History of Present Illness 63yo w/ a hx of substance abuse on methadone, emphysema, chronic bronchitis, recently found lung nodules and a vocal cord lesion who presented with worsening dyspnea. He had a CT chest 2/11 for persisting cough and was found to have clustered fine nodular and irregular opacities in medial lower lobes, central middle lobe, and lingula. Path report pending. Trach 07/21/2018.     PT Comments    Patient ambulating with improved stability and endurance today. VSS on RA. Continue to rec HHPT.   Follow Up Recommendations  Home health PT;Supervision for mobility/OOB     Equipment Recommendations  Other (comment)    Recommendations for Other Services       Precautions / Restrictions Precautions Precautions: Other (comment) Precaution Comments: trach Restrictions Weight Bearing Restrictions: No    Mobility  Bed Mobility Overal bed mobility: Needs Assistance Bed Mobility: Supine to Sit     Supine to sit: Supervision;HOB elevated     General bed mobility comments: preformed with OT before PT came into the room)  Transfers Overall transfer level: Needs assistance Equipment used: None Transfers: Sit to/from Stand Sit to Stand: Supervision         General transfer comment: Min +2 for line management/O2 management.  Steadying assist.   Ambulation/Gait Ambulation/Gait assistance: Supervision Gait Distance (Feet): 400 Feet Assistive device: 1 person hand held assist;None Gait Pattern/deviations: Step-through pattern Gait velocity: decreaseed   General Gait Details: pt with imrpoved balance and stability today, ambulating unit without AD, on RA VSS   Stairs             Wheelchair Mobility    Modified Rankin (Stroke Patients Only)       Balance Overall balance assessment: Needs assistance Sitting-balance  support: Feet supported Sitting balance-Leahy Scale: Good     Standing balance support: Single extremity supported Standing balance-Leahy Scale: Fair Standing balance comment: static standing with close supervision, dynamic activities with min guard/min assist.                             Cognition Arousal/Alertness: Awake/alert Behavior During Therapy: WFL for tasks assessed/performed Overall Cognitive Status: Within Functional Limits for tasks assessed                                        Exercises      General Comments        Pertinent Vitals/Pain Pain Assessment: No/denies pain    Home Living                      Prior Function            PT Goals (current goals can now be found in the care plan section) Acute Rehab PT Goals Patient Stated Goal: to go home PT Goal Formulation: With patient/family Time For Goal Achievement: 08/06/18 Potential to Achieve Goals: Good Progress towards PT goals: Progressing toward goals    Frequency    Min 3X/week      PT Plan Current plan remains appropriate    Co-evaluation              AM-PAC PT "6 Clicks" Mobility   Outcome Measure  Help needed turning from your back to your side  while in a flat bed without using bedrails?: A Little Help needed moving from lying on your back to sitting on the side of a flat bed without using bedrails?: A Little Help needed moving to and from a bed to a chair (including a wheelchair)?: A Little Help needed standing up from a chair using your arms (e.g., wheelchair or bedside chair)?: A Little Help needed to walk in hospital room?: A Little Help needed climbing 3-5 steps with a railing? : A Little 6 Click Score: 18    End of Session Equipment Utilized During Treatment: Gait belt;Oxygen Activity Tolerance: Patient tolerated treatment well Patient left: in chair;with call bell/phone within reach;with family/visitor present Nurse  Communication: Mobility status PT Visit Diagnosis: Muscle weakness (generalized) (M62.81);Difficulty in walking, not elsewhere classified (R26.2);Unsteadiness on feet (R26.81)     Time: 0321-2248 PT Time Calculation (min) (ACUTE ONLY): 19 min  Charges:  $Gait Training: 8-22 mins                    Reinaldo Berber, PT, DPT Acute Rehabilitation Services Pager: [phone] Office: [phone]     Reinaldo Berber 07/26/2018, 5:49 PM

## 2018-07-27 ENCOUNTER — Inpatient Hospital Stay (HOSPITAL_COMMUNITY): Payer: BLUE CROSS/BLUE SHIELD

## 2018-07-27 ENCOUNTER — Inpatient Hospital Stay: Admit: 2018-07-27 | Payer: BLUE CROSS/BLUE SHIELD | Admitting: Otolaryngology

## 2018-07-27 ENCOUNTER — Telehealth: Payer: Self-pay | Admitting: *Deleted

## 2018-07-27 DIAGNOSIS — E43 Unspecified severe protein-calorie malnutrition: Secondary | ICD-10-CM

## 2018-07-27 DIAGNOSIS — C320 Malignant neoplasm of glottis: Principal | ICD-10-CM

## 2018-07-27 DIAGNOSIS — J9601 Acute respiratory failure with hypoxia: Secondary | ICD-10-CM

## 2018-07-27 DIAGNOSIS — Z681 Body mass index (BMI) 19 or less, adult: Secondary | ICD-10-CM

## 2018-07-27 LAB — CBC WITH DIFFERENTIAL/PLATELET
Abs Immature Granulocytes: 0.02 10*3/uL (ref 0.00–0.07)
Basophils Absolute: 0.1 10*3/uL (ref 0.0–0.1)
Basophils Relative: 1 %
Eosinophils Absolute: 0.4 10*3/uL (ref 0.0–0.5)
Eosinophils Relative: 4 %
HCT: 37.3 % — ABNORMAL LOW (ref 39.0–52.0)
Hemoglobin: 11.9 g/dL — ABNORMAL LOW (ref 13.0–17.0)
Immature Granulocytes: 0 %
Lymphocytes Relative: 27 %
Lymphs Abs: 2.5 10*3/uL (ref 0.7–4.0)
MCH: 30.4 pg (ref 26.0–34.0)
MCHC: 31.9 g/dL (ref 30.0–36.0)
MCV: 95.4 fL (ref 80.0–100.0)
MONOS PCT: 12 %
Monocytes Absolute: 1.1 10*3/uL — ABNORMAL HIGH (ref 0.1–1.0)
Neutro Abs: 5.3 10*3/uL (ref 1.7–7.7)
Neutrophils Relative %: 56 %
Platelets: 169 10*3/uL (ref 150–400)
RBC: 3.91 MIL/uL — ABNORMAL LOW (ref 4.22–5.81)
RDW: 13.6 % (ref 11.5–15.5)
WBC: 9.4 10*3/uL (ref 4.0–10.5)
nRBC: 0 % (ref 0.0–0.2)

## 2018-07-27 LAB — GLUCOSE, CAPILLARY
Glucose-Capillary: 99 mg/dL (ref 70–99)
Glucose-Capillary: 101 mg/dL — ABNORMAL HIGH (ref 70–99)
Glucose-Capillary: 96 mg/dL (ref 70–99)
Glucose-Capillary: 86 mg/dL (ref 70–99)

## 2018-07-27 SURGERY — MICROLARYNGOSCOPY
Anesthesia: General

## 2018-07-27 NOTE — Progress Notes (Signed)
Modified Barium Swallow Progress Note  Patient Details  Name: Ronald Ruiz MRN: [id number] Date of Birth: 11/08/1955  Today's Date: 07/27/2018  Modified Barium Swallow completed.  Full report located under Chart Review in the Imaging Section.  Brief recommendations include the following:  Clinical Impression  Pt shows minimal change from initial MBS, although perhaps with mildly increased bolus entry into the UES. As a result, thicker consistencies were also tested (honey thick liquids and purees). Unfortunately, pt continues to have inadequate airway closure, silently aspirating thin and nectar thick liquids before/during the swallow, but also silently aspirating on the residue left behind from anything nectar thick or thicker. SLP attempted various compensatory strategies, including: volitional coughs to clear the airway, chin tuck, head turns, and super supraglottic swallow. Unfortunately, aspiration could not be eliminated. Although thicker consistencies like honey thick liquids by spoon and purees were aspirated in small amounts, it would like be occurring across any PO intake. Recommend limiting POs to ice chips primarily during pharyngeal strengthening exercises and only after oral care. Longer-term alternative nutrition would be recommended if within [place]. SLP will continue to follow acutely, but would also recommend f/u post-discharge.   Swallow Evaluation Recommendations       SLP Diet Recommendations: NPO;Alternative means - long-term       Medication Administration: Via alternative means               Oral Care Recommendations: Oral care QID   Other Recommendations: Have oral suction available    Venita Sheffield Saddie Sandeen 07/27/2018,11:27 AM   Pollyann Glen, M.A. [facility] Acute Environmental education officer [phone] Office [phone]

## 2018-07-27 NOTE — Progress Notes (Signed)
PROGRESS NOTE    Ronald Ruiz  HKV:[id number]  DOB: 1956/01/22  DOA: 07/21/2018 PCP: [name], MD  Brief Narrative: 63yo w/ a hx of substance abuse on methadone, emphysema, chronic bronchitis, recently found lung nodules and a vocal cord lesion who presented with worsening dyspnea. He had a CT chest 2/11 for persisting cough and was found to have clustered fine nodular and irregular opacities in medial lower lobes, central middle lobe, and lingula. He was found to have a 1cm pulmonary nodule in the right middle lobe and an irregular nodule in the left lower lobe measuring 1.1 cm. He was seen by Pulmonary on 2/14 (Dr Vaughan Browner), and had a PET scan 2/25 which noted no significant uptake in the lung nodules, but abnormal increased signal in the left vocal cord. He experienced worsening trouble breathing, and reported a 35 lb weight loss over 7 months.   Subjective:    Patient still has NG tube.He was re-evaluated by speech therapy who is recommending PEG. Awake alert oriented and communicating appropriately.  Daughter bedside.  Objective: Vitals:   07/27/18 1201 07/27/18 1510 07/27/18 1514 07/27/18 1618  BP: 112/61   123/60  Pulse: (!) 58  (!) 57 71  Resp: 10  12 12   Temp: 98.7 F (37.1 C)   98.8 F (37.1 C)  TempSrc: Oral   Oral  SpO2: 96% 100% 100% 98%  Weight:      Height:        Intake/Output Summary (Last 24 hours) at 07/27/2018 1832 Last data filed at 07/27/2018 1352 Gross per 24 hour  Intake 1320.67 ml  Output 1375 ml  Net -54.33 ml   Filed Weights   07/24/18 0500 07/26/18 0500 07/27/18 0408  Weight: 64.7 kg 67.1 kg 66.4 kg    Physical Examination:  General exam: Appears calm and comfortable  Respiratory system: Status post trach.  Clear to auscultation. Respiratory effort normal. Cardiovascular system: S1 & S2 heard, RRR. No JVD, murmurs, rubs, gallops or clicks. No pedal edema. Gastrointestinal system: NG tube in place.  Abdomen is nondistended, soft and  nontender. No organomegaly or masses felt. Normal bowel sounds heard. Central nervous system: Alert and oriented. No focal neurological deficits. Extremities: Symmetric 5 x 5 power. Skin: No rashes, lesions or ulcers Psychiatry: Judgement and insight appear normal. Mood & affect appropriate.     Data Reviewed: I have personally reviewed following labs and imaging studies  CBC: Recent Labs  Lab 07/21/18 1914 07/22/18 0223 07/24/18 0322 07/25/18 0251 07/27/18 0246  WBC 10.7* 7.6 10.7* 10.2 9.4  NEUTROABS 9.4*  --   --   --  5.3  HGB 13.5 14.1 13.6 13.4 11.9*  HCT 43.0 43.3 41.5 40.2 37.3*  MCV 97.3 95.2 93.5 94.4 95.4  PLT 144* 144* 143* 131* 564   Basic Metabolic Panel: Recent Labs  Lab 07/21/18 1914 07/22/18 0223 07/23/18 1635 07/24/18 0322 07/25/18 0251  NA 137 141  --   --  138  K 4.1 4.4  --   --  3.6  CL 103 100  --   --  102  CO2 27 30  --   --  29  GLUCOSE 116* 149*  --   --  108*  BUN 15 12  --   --  22  CREATININE 0.90 0.87  --   --  0.78  CALCIUM 9.4 9.5  --   --  8.9  MG  --   --  2.1 2.1 2.1  PHOS  --   --  3.2 3.1  --    GFR: Estimated Creatinine Clearance: 89.9 mL/min (by C-G formula based on SCr of 0.78 mg/dL). Liver Function Tests: Recent Labs  Lab 07/25/18 0251  AST 14*  ALT 13  ALKPHOS 50  BILITOT 0.5  PROT 6.5  ALBUMIN 3.0*   No results for input(s): LIPASE, AMYLASE in the last 168 hours. No results for input(s): AMMONIA in the last 168 hours. Coagulation Profile: Recent Labs  Lab 07/21/18 1914  INR 1.0   Cardiac Enzymes: No results for input(s): CKTOTAL, CKMB, CKMBINDEX, TROPONINI in the last 168 hours. BNP (last 3 results) No results for input(s): PROBNP in the last 8760 hours. HbA1C: No results for input(s): HGBA1C in the last 72 hours. CBG: Recent Labs  Lab 07/26/18 2344 07/27/18 0331 07/27/18 0728 07/27/18 1158 07/27/18 1614  GLUCAP 105* 99 101* 96 86   Lipid Profile: No results for input(s): CHOL, HDL, LDLCALC,  TRIG, CHOLHDL, LDLDIRECT in the last 72 hours. Thyroid Function Tests: No results for input(s): TSH, T4TOTAL, FREET4, T3FREE, THYROIDAB in the last 72 hours. Anemia Panel: No results for input(s): VITAMINB12, FOLATE, FERRITIN, TIBC, IRON, RETICCTPCT in the last 72 hours. Sepsis Labs: No results for input(s): PROCALCITON, LATICACIDVEN in the last 168 hours.  Recent Results (from the past 240 hour(s))  MRSA PCR Screening     Status: None   Collection Time: 07/21/18 10:14 PM  Result Value Ref Range Status   MRSA by PCR NEGATIVE NEGATIVE Final    Comment:        The GeneXpert MRSA Assay (FDA approved for NASAL specimens only), is one component of a comprehensive MRSA colonization surveillance program. It is not intended to diagnose MRSA infection nor to guide or monitor treatment for MRSA infections. Performed at [facility], [place] [address], [place], [place] [address]       Radiology Studies: Dg Swallowing Func-speech Pathology  Result Date: 07/27/2018 Objective Swallowing Evaluation: Type of Study: MBS-Modified Barium Swallow Study  Patient Details Name: Ronald Ruiz MRN: [id number] Date of Birth: 1956/01/01 Today's Date: 07/27/2018 Time: SLP Start Time (ACUTE ONLY): 1013 -SLP Stop Time (ACUTE ONLY): 1034 SLP Time Calculation (min) (ACUTE ONLY): 21 min Past Medical History: Past Medical History: Diagnosis Date . Bronchitis 03/2018 . Herpes  . Lung nodule 2020 . Substance abuse ([place])   opiate addiction, been on methadone for 11 years Past Surgical History: Past Surgical History: Procedure Laterality Date . DIRECT LARYNGOSCOPY N/A 07/21/2018  Procedure: DIRECT LARYNGOSCOPY, TRACHEOSTOMY, BIOPSY;  Surgeon: Leta Baptist, MD;  Location: [facility] OR;  Service: ENT;  Laterality: N/A; HPI: Pt is a 63 year old man recently found to have lung nodules and L vocal cord lesion presenting with worsening dyspnea, s/p trach 2/29. ENT notes indicate a large mass replacing the left vocal cord and the arytenoid  cartilage with no mobility; sluggish mobility of the R vocal fold; and severe narrowing of the glottic opening. 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, emphysema, chronic bronchitis  Subjective: pt alert, hopeful for swallow study results Assessment / Plan / Recommendation CHL IP CLINICAL IMPRESSIONS 07/27/2018 Clinical Impression Pt shows minimal change from initial MBS, although perhaps with mildly increased bolus entry into the UES. As a result, thicker consistencies were also tested (honey thick liquids and purees). Unfortunately, pt continues to have inadequate airway closure, silently aspirating thin and nectar thick liquids before/during the swallow, but also silently aspirating on the residue left behind from anything nectar thick or thicker. SLP attempted various compensatory strategies, including: volitional  coughs to clear the airway, chin tuck, head turns, and super supraglottic swallow. Unfortunately, aspiration could not be eliminated. Although thicker consistencies like honey thick liquids by spoon and purees were aspirated in small amounts, it would like be occurring across any PO intake. Recommend limiting POs to ice chips primarily during pharyngeal strengthening exercises and only after oral care. Longer-term alternative nutrition would be recommended if within [place]. SLP will continue to follow acutely, but would also recommend f/u post-discharge. SLP Visit Diagnosis Dysphagia, pharyngoesophageal phase (R13.14);Aphonia (R49.1) Attention and concentration deficit following -- Frontal lobe and executive function deficit following -- Impact on safety and function Severe aspiration risk;Risk for inadequate nutrition/hydration   CHL IP TREATMENT RECOMMENDATION 07/27/2018 Treatment Recommendations Therapy as outlined in treatment plan below   Prognosis 07/27/2018 Prognosis for Safe Diet Advancement Fair Barriers to Reach Goals Severity of deficits Barriers/Prognosis Comment -- CHL IP DIET RECOMMENDATION  07/27/2018 SLP Diet Recommendations NPO;Alternative means - long-term Liquid Administration via -- Medication Administration Via alternative means Compensations -- Postural Changes --   CHL IP OTHER RECOMMENDATIONS 07/27/2018 Recommended Consults -- Oral Care Recommendations Oral care QID Other Recommendations Have oral suction available   CHL IP FOLLOW UP RECOMMENDATIONS 07/27/2018 Follow up Recommendations Outpatient SLP   CHL IP FREQUENCY AND DURATION 07/27/2018 Speech Therapy Frequency (ACUTE ONLY) min 2x/week Treatment Duration 2 weeks      CHL IP ORAL PHASE 07/27/2018 Oral Phase WFL Oral - Pudding Teaspoon -- Oral - Pudding Cup -- Oral - Honey Teaspoon -- Oral - Honey Cup -- Oral - Nectar Teaspoon -- Oral - Nectar Cup -- Oral - Nectar Straw -- Oral - Thin Teaspoon -- Oral - Thin Cup -- Oral - Thin Straw -- Oral - Puree -- Oral - Mech Soft -- Oral - Regular -- Oral - Multi-Consistency -- Oral - Pill -- Oral Phase - Comment --  CHL IP PHARYNGEAL PHASE 07/27/2018 Pharyngeal Phase Impaired Pharyngeal- Pudding Teaspoon -- Pharyngeal -- Pharyngeal- Pudding Cup -- Pharyngeal -- Pharyngeal- Honey Teaspoon Reduced pharyngeal peristalsis;Reduced epiglottic inversion;Reduced anterior laryngeal mobility;Reduced laryngeal elevation;Reduced airway/laryngeal closure;Reduced tongue base retraction;Penetration/Apiration after swallow;Pharyngeal residue - valleculae;Pharyngeal residue - pyriform Pharyngeal Material enters airway, passes BELOW cords without attempt by patient to eject out (silent aspiration) Pharyngeal- Honey Cup -- Pharyngeal -- Pharyngeal- Nectar Teaspoon Reduced pharyngeal peristalsis;Reduced epiglottic inversion;Reduced anterior laryngeal mobility;Reduced laryngeal elevation;Reduced airway/laryngeal closure;Reduced tongue base retraction;Penetration/Aspiration during swallow;Penetration/Apiration after swallow;Pharyngeal residue - valleculae;Pharyngeal residue - pyriform Pharyngeal Material enters airway, passes BELOW  cords without attempt by patient to eject out (silent aspiration) Pharyngeal- Nectar Cup -- Pharyngeal -- Pharyngeal- Nectar Straw -- Pharyngeal -- Pharyngeal- Thin Teaspoon Reduced pharyngeal peristalsis;Reduced epiglottic inversion;Reduced anterior laryngeal mobility;Reduced laryngeal elevation;Reduced airway/laryngeal closure;Reduced tongue base retraction;Penetration/Aspiration during swallow;Penetration/Apiration after swallow;Pharyngeal residue - valleculae;Pharyngeal residue - pyriform Pharyngeal Material enters airway, passes BELOW cords without attempt by patient to eject out (silent aspiration) Pharyngeal- Thin Cup -- Pharyngeal -- Pharyngeal- Thin Straw -- Pharyngeal -- Pharyngeal- Puree Reduced pharyngeal peristalsis;Reduced epiglottic inversion;Reduced anterior laryngeal mobility;Reduced laryngeal elevation;Reduced airway/laryngeal closure;Reduced tongue base retraction;Penetration/Apiration after swallow;Pharyngeal residue - valleculae;Pharyngeal residue - pyriform Pharyngeal Material enters airway, passes BELOW cords without attempt by patient to eject out (silent aspiration) Pharyngeal- Mechanical Soft -- Pharyngeal -- Pharyngeal- Regular -- Pharyngeal -- Pharyngeal- Multi-consistency -- Pharyngeal -- Pharyngeal- Pill -- Pharyngeal -- Pharyngeal Comment --  CHL IP CERVICAL ESOPHAGEAL PHASE 07/27/2018 Cervical Esophageal Phase Impaired Pudding Teaspoon -- Pudding Cup -- Honey Teaspoon Reduced cricopharyngeal relaxation Honey Cup -- Nectar Teaspoon Reduced cricopharyngeal relaxation Nectar Cup --  Nectar Straw -- Thin Teaspoon Reduced cricopharyngeal relaxation Thin Cup -- Thin Straw -- Puree Reduced cricopharyngeal relaxation Mechanical Soft -- Regular -- Multi-consistency -- Pill -- Cervical Esophageal Comment -- Venita Sheffield Nix 07/27/2018, 11:28 AM  Pollyann Glen, M.A. CCC-SLP Acute Rehabilitation Services Pager [phone] Office [phone]                  Scheduled Meds: . arformoterol  15 mcg  Nebulization BID  . budesonide (PULMICORT) nebulizer solution  0.25 mg Nebulization BID  . chlorhexidine  15 mL Mouth Rinse BID  . enoxaparin (LOVENOX) injection  40 mg Subcutaneous Q24H  . ipratropium  0.5 mg Nebulization TID  . mouth rinse  15 mL Mouth Rinse q12n4p  . methadone  80 mg Oral Daily  . nicotine  21 mg Transdermal Daily   Continuous Infusions: . feeding supplement (VITAL AF 1.2 CAL) 70 mL/hr at 07/27/18 1352    Assessment & Plan:    1.  Vocal cord mass/invasive squamous cell carcinoma: Appreciate ENT evaluation and follow-up.  Patient status post trach day 5 and speaking well with Passy-Muir valve.  Trach changed to uncuffed #6 Shiley by ENT.  Will likely need radiation therapy with or without chemotherapy per ENT.  2.  Dysphagia/aspiration risk: Secondary to problem #1.  Still on NG tube.  ENT recommended reevaluation by speech therapy given trach changed to uncuffed but patient failed eval today. He is headed towards PEG placement, consulted GI who recommended IR consultation which I have requested.   3.  History of polysubstance abuse: Patient on 110 mg of methadone daily at home.  Currently at 80 mg and tolerating well without signs of sedation or withdrawal.  On nicotine patch as well.  4.  Thrombocytopenia: HIV and hepatitis work-up negative.  Unlikely to be HIT per d/w pharmacy.  CBC today shows improvement to baseline.  5.  Emphysema: CT chest on February 11 did show significant emphysematous changes and lung nodules.  Neb treatments PRN.  6.  Weight loss/severe protein calorie malnutrition: Secondary to problem #1.  Continue tube feeds.  DVT prophylaxis: Lovenox Code Status: Full code Family / Patient Communication: Discussed with patient and daughters bedside. Disposition Plan: To be determined     LOS: 6 days    Time spent: 35 minutes    Guilford Shi, MD [facility] Pager [phone]  If 7PM-7AM, please contact  night-coverage [email or web address] Password [facility] 07/27/2018, 6:32 PM

## 2018-07-27 NOTE — Telephone Encounter (Signed)
Oncology Nurse Navigator Documentation  In follow-up to NP [facility]'s notification of patient admission, placed navigation introductory call to pt's dtr, LVMM asking for return call.  Gayleen Orem, RN, BSN Head & Neck Oncology Nurse [place] [facility] [phone]

## 2018-07-27 NOTE — Progress Notes (Signed)
Subjective: Resting comfortably in bed. Talking well using the PMV. Failed swallowing study.  Objective: Vital signs in last 24 hours: Temp:  [97.7 F (36.5 C)-98.8 F (37.1 C)] 98.8 F (37.1 C) (03/06 1618) Pulse Rate:  [57-71] 71 (03/06 1618) Resp:  [10-21] 12 (03/06 1618) BP: (100-129)/(44-61) 123/60 (03/06 1618) SpO2:  [95 %-100 %] 98 % (03/06 1618) FiO2 (%):  [28 %] 28 % (03/06 1514) Weight:  [66.4 kg] 66.4 kg (03/06 0408)  Physical Exam: General:NAD. Awake and responsive. Eyes: His pupils are equal, round, reactive to light. Extraocular motion is intact.  Ears: Examination of the ears shows normal auricles and external auditory canals bilaterally.  Nose: Nasal examination shows normal mucosa, septum, turbinates.  Face: Facial examination shows no asymmetry. Palpation of the face elicit no significant tenderness.  Mouth: Oral cavity examination shows normal mucosa. Edentulous. Neck: Trach tube in place. The trachea is midline. The thyroid is not significantly enlarged.  Neuro: Cranial nerves 2-12 are all grossly in tact. Abdomen:Soft, nontender, nondistended Extremities:No LE edema Neuro:Alert, oriented  Recent Labs    07/25/18 0251 07/27/18 0246  WBC 10.2 9.4  HGB 13.4 11.9*  HCT 40.2 37.3*  PLT 131* 169   Recent Labs    07/25/18 0251  NA 138  K 3.6  CL 102  CO2 29  GLUCOSE 108*  BUN 22  CREATININE 0.78  CALCIUM 8.9    Medications:  I have reviewed the patient's current medications. Scheduled: . arformoterol  15 mcg Nebulization BID  . budesonide (PULMICORT) nebulizer solution  0.25 mg Nebulization BID  . chlorhexidine  15 mL Mouth Rinse BID  . enoxaparin (LOVENOX) injection  40 mg Subcutaneous Q24H  . ipratropium  0.5 mg Nebulization TID  . mouth rinse  15 mL Mouth Rinse q12n4p  . methadone  80 mg Oral Daily  . nicotine  21 mg Transdermal Daily   Continuous: . feeding supplement (VITAL AF 1.2 CAL) 70 mL/hr at 07/27/18 1352    ZTI:[id number], albuterol, ondansetron (ZOFRAN) IV  Assessment/Plan: POD #6s/p trach, direct laryngoscopy and biopsy. 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 - Speaking well with the PMV. He has an uncuffed #6 Shiley. -Will need PEG tube placement - Treatment per oncology. - Pt will follow up with me as an outpatient after his treatment.    LOS: 6 days   Sulamita Lafountain W Yzabelle Calles 07/27/2018, 6:55 PM

## 2018-07-28 ENCOUNTER — Inpatient Hospital Stay (HOSPITAL_COMMUNITY): Payer: BLUE CROSS/BLUE SHIELD

## 2018-07-28 LAB — GLUCOSE, CAPILLARY
Glucose-Capillary: 101 mg/dL — ABNORMAL HIGH (ref 70–99)
Glucose-Capillary: 102 mg/dL — ABNORMAL HIGH (ref 70–99)
Glucose-Capillary: 73 mg/dL (ref 70–99)
Glucose-Capillary: 82 mg/dL (ref 70–99)
Glucose-Capillary: 93 mg/dL (ref 70–99)
Glucose-Capillary: 98 mg/dL (ref 70–99)

## 2018-07-28 MED ORDER — DEXTROSE 5 % IV SOLN
INTRAVENOUS | Status: DC
  Administered 2018-07-28 – 2018-07-30 (×2): via INTRAVENOUS

## 2018-07-28 MED ORDER — ACETAMINOPHEN 160 MG/5ML PO SOLN
650.0000 mg | ORAL | Status: DC | PRN
  Administered 2018-07-29: 650 mg via ORAL
  Filled 2018-07-28: qty 20.3

## 2018-07-28 NOTE — Progress Notes (Addendum)
End of shift daily summary:  0700 -Report received from miss Michele B. in AM.   0730 - Introduced self to patient who endorsed not questions or concerns at that time.   0900 - Morning medications given transdermal (nicotine patch) and via NGT which flushed well with sterile water  1100 - Patient went to CT for CT Abd/Pelvis to evaluate anatomy for possible gastrostomy tube placement  1130 - Spoke to Dr. Marthenia Rolling in reference to continuation of patients Dextrose 5% mIVF at 34m/hr, at present time this will be continued  1230 - Tracheostomy care performed  1300 - Provided patient and his daughter printed information on tracheostomy care  1400 - New tubing & feeding supplement (Vital 1.2) hung   1700 - Oral care performed inclusive of suction brushing  1730 - Tylenol administered for 4/10 dull HA  1805 - Patient resting comfortably with his daughters at bedside Verified all needs had been met prior to end of shift Pain with resolution 0/10 Additional tracheostomy and G-Tube care education to be provided to patients daughters by oncoming shift(s).
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
## 2018-07-28 NOTE — Consult Note (Signed)
[place]  [phone]) [phone] Fax:(336) [phone]   MEDICAL ONCOLOGY - INITIAL CONSULTATION  Referral MD: Dr. 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  The patient was undergoing outpatient evaluation for lung nodules and a vocal cord lesion and presented to the emergency room with worsening dyspnea.  ENT was consulted for his vocal cord mass.  Due to the location of the laryngeal mass, the patient was in danger of developing an upper airway obstruction.  A tracheostomy was placed to protect his airway.  A biopsy of the mass was performed.  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  The patient tells me that he was having persistent upper respiratory symptoms with dyspnea and cough since about November 2019.  He was on several rounds of antibiotics and steroids without significant improvement.  He reports a 38-month history of hoarseness in his voice.  He has lost about 40 pounds over the past 6 months.  He also reports having a sore throat.  Denies difficulty swallowing to admission.  Breathing is better since placement of tracheostomy.  He has not had any fevers or chills.  Denies chest pain.  Denies nausea, vomiting, constipation, diarrhea.  He is on NG tube feeding at this time.  Working with speech therapy on swallowing.  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  The patient is widowed and currently lives alone.  Wife died about 15 months ago secondary to lung cancer. Upon discharge, he plans to go live with 1 of his daughters in [place], [place].  He has 3 daughters who live locally.  1 of his daughters is an Therapist, sports who  works on the oncology unit at [employer].    Past Medical History:  Diagnosis Date  . Bronchitis 03/2018  . Herpes   . Lung nodule 2020  . Substance abuse ([place])    opiate addiction, been on methadone for 11 years  :   Past Surgical History:  Procedure Laterality Date  . DIRECT LARYNGOSCOPY N/A 07/21/2018   Procedure: DIRECT LARYNGOSCOPY, TRACHEOSTOMY, BIOPSY;  Surgeon: Leta Baptist, MD;  Location: [facility] OR;  Service: ENT;  Laterality: N/A;  :   Current Facility-Administered Medications  Medication Dose Route Frequency Provider Last Rate Last Dose  . acetaminophen (TYLENOL) tablet 650 mg  650 mg Per Tube Q4H PRN Cherene Altes, MD   650 mg at 07/27/18 0401  . albuterol (PROVENTIL) (2.5 MG/3ML) 0.083% nebulizer solution 2.5 mg  2.5 mg Nebulization Q2H PRN Milagros Loll, MD      . arformoterol [facility]) nebulizer solution 15 mcg  15 mcg Nebulization BID Milagros Loll, MD   15 mcg at 07/28/18 0757  . budesonide (PULMICORT) nebulizer solution 0.25 mg  0.25 mg Nebulization BID Jacques Earthly T, MD   0.25 mg at 07/28/18 0757  . chlorhexidine (PERIDEX) 0.12 % solution 15 mL  15 mL Mouth Rinse BID Milagros Loll, MD   15 mL at 07/28/18 0916  . dextrose 5 % solution   Intravenous Continuous Bodenheimer, Charles A, NP 50 mL/hr at 07/28/18 0900    . enoxaparin (LOVENOX) injection 40 mg  40 mg Subcutaneous Q24H Milagros Loll, MD   40  mg at 07/27/18 1300  . feeding supplement (VITAL AF 1.2 CAL) liquid 1,000 mL  1,000 mL Per Tube Continuous Cherene Altes, MD 70 mL/hr at 07/28/18 0900    . ipratropium (ATROVENT) nebulizer solution 0.5 mg  0.5 mg Nebulization TID Jacques Earthly T, MD   0.5 mg at 07/28/18 0757  . MEDLINE mouth rinse  15 mL Mouth Rinse q12n4p Milagros Loll, MD   15 mL at 07/27/18 1626  . methadone (DOLOPHINE) tablet 80 mg  80 mg Oral Daily Cherene Altes, MD   80 mg at 07/28/18 0900  . nicotine (NICODERM CQ - dosed in mg/24 hours) patch 21 mg  21 mg  Transdermal Daily Cherene Altes, MD   21 mg at 07/28/18 0910  . ondansetron (ZOFRAN) injection 4 mg  4 mg Intravenous Q6H PRN Milagros Loll, MD   4 mg at 07/27/18 0919     No Known Allergies:   Family History  Problem Relation Age of Onset  . Alcohol abuse Father   . Cancer Father   . Cirrhosis Father   . Drug abuse Brother   :   Social History   Socioeconomic History  . Marital status: Widowed    Spouse name: Not on file  . Number of children: Not on file  . Years of education: Not on file  . Highest education level: Not on file  Occupational History  . Not on file  Social Needs  . Financial resource strain: Not on file  . Food insecurity:    Worry: Not on file    Inability: Not on file  . Transportation needs:    Medical: Not on file    Non-medical: Not on file  Tobacco Use  . Smoking status: Current Every Day Smoker    Packs/day: 2.00    Years: 50.00    Pack years: 100.00    Types: Cigarettes  . Smokeless tobacco: Never Used  . Tobacco comment: 07/06/18 at 10 cigs per day  Substance and Sexual Activity  . Alcohol use: Not Currently  . Drug use: Not Currently  . Sexual activity: Not Currently    Birth control/protection: None  Lifestyle  . Physical activity:    Days per week: Not on file    Minutes per session: Not on file  . Stress: Not on file  Relationships  . Social connections:    Talks on phone: Not on file    Gets together: Not on file    Attends religious service: Not on file    Active member of club or organization: Not on file    Attends meetings of clubs or organizations: Not on file    Relationship status: Not on file  . Intimate partner violence:    Fear of current or ex partner: Not on file    Emotionally abused: Not on file    Physically abused: Not on file    Forced sexual activity: Not on file  Other Topics Concern  . Not on file  Social History Narrative  . Not on file   Review of Systems: Constitutional: positive for  fatigue and weight loss, negative for chills, fevers and night sweats Eyes: negative Ears, nose, mouth, throat, and face: negative Respiratory: positive for chronic bronchitis, cough and dyspnea on exertion, negative for hemoptysis and wheezing Cardiovascular: negative Gastrointestinal: negative Genitourinary:negative Hematologic/lymphatic: negative Musculoskeletal:negative Neurological: negative Behavioral/Psych: negative Endocrine: negative Allergic/Immunologic: negative  Exam: Patient Vitals for the past 24 hrs:  BP Temp Temp  src Pulse Resp SpO2 Weight  07/28/18 1138 97/65 97.8 F (36.6 C) Oral (!) 54 14 97 % -  07/28/18 1137 - - - (!) 59 16 97 % -  07/28/18 0818 - - - 62 18 100 % -  07/28/18 0805 - - - - - 100 % -  07/28/18 0801 - - - - - 100 % -  07/28/18 0758 - - - - - 100 % -  07/28/18 0736 (!) 112/57 98.8 F (37.1 C) Oral 81 10 99 % -  07/28/18 0450 - - - - - - 136 lb 7.4 oz (61.9 kg)  07/28/18 0427 - - - - - 99 % -  07/28/18 0315 (!) 128/50 98.3 F (36.8 C) Oral (!) 56 13 100 % -  07/28/18 0003 - - - - - 99 % -  07/27/18 2318 (!) 104/46 98.3 F (36.8 C) Oral (!) 57 15 100 % -  07/27/18 1931 - - - - - 98 % -  07/27/18 1921 (!) 112/55 97.7 F (36.5 C) Oral (!) 58 12 98 % -  07/27/18 1618 123/60 98.8 F (37.1 C) Oral 71 12 98 % -  07/27/18 1514 - - - (!) 57 12 100 % -  07/27/18 1510 - - - - - 100 % -    General: Thin male who is in no acute distress.  Eyes:  no scleral icterus.  ENT: No thrush.  Tracheostomy is midline.  Lymphatics:  Negative cervical, supraclavicular or axillary adenopathy.  Respiratory: Lungs with crackles in the bases. Cardiovascular:  Regular rate and rhythm, S1/S2, without murmur, rub or gallop.  There was no pedal edema.  GI:  abdomen was soft, flat, nontender, nondistended, without organomegaly.  Muscoloskeletal:  no spinal tenderness of palpation of vertebral spine.  Skin exam was without echymosis, petichae.  Neuro exam was nonfocal. Patient  was alert and oriented.  Attention was good.   Language was appropriate.  Mood was normal without depression.  Speech was not pressured.  Thought content was not tangential.     Lab Results  Component Value Date   WBC 9.4 07/27/2018   HGB 11.9 (L) 07/27/2018   HCT 37.3 (L) 07/27/2018   PLT 169 07/27/2018   GLUCOSE 108 (H) 07/25/2018   CHOL 173 06/14/2018   TRIG 123 06/14/2018   HDL 46 06/14/2018   LDLCALC 102 (H) 06/14/2018   ALT 13 07/25/2018   AST 14 (L) 07/25/2018   NA 138 07/25/2018   K 3.6 07/25/2018   CL 102 07/25/2018   CREATININE 0.78 07/25/2018   BUN 22 07/25/2018   CO2 29 07/25/2018    Ct Chest Wo Contrast  Result Date: 07/03/2018 CLINICAL DATA:  Shortness of breath, hoarseness EXAM: CT CHEST WITHOUT CONTRAST TECHNIQUE: Multidetector CT imaging of the chest was performed following the standard protocol without IV contrast. COMPARISON:  Chest radiograph, 06/14/2018 FINDINGS: Cardiovascular: Three-vessel coronary artery calcifications. Normal heart size. Trace pericardial effusion. Mediastinum/Nodes: No enlarged mediastinal, hilar, or axillary lymph nodes. Frothy secretions in the trachea. Thyroid gland and esophagus demonstrate no significant findings. Lungs/Pleura: Mild centrilobular emphysema and hyperinflation. There is clustered fine nodular and irregular opacity predominantly involving medial lower lobes, central middle lobe, and lingula (series 4, image 112). There is a 1.0 cm pulmonary nodule of the most anterior inferior right middle lobe (series 4, image 137). There is an additional irregular elongated nodule of the left lower lobe measuring 1.1 x 0.5 cm (series 4, image 121) and an adjacent  irregular 5 mm nodule (series 4, image 125). There are additional, very fine, tiny centrilobular nodules most conspicuous in the upper lobes. No pleural effusion or pneumothorax. Upper Abdomen: No acute abnormality. Musculoskeletal: No chest wall mass or suspicious bone lesions  identified. IMPRESSION: 1. There is clustered fine nodular and irregular opacity predominantly involving medial lower lobes, central middle lobe, and lingula (series 4, image 112). Findings are most consistent with atypical infection, including atypical mycobacterium. There are additional, very fine, tiny centrilobular nodules most conspicuous in the upper lobes, which likely reflect smoking related respiratory bronchiolitis. 2. There is a 1.0 cm pulmonary nodule of the most anterior inferior right middle lobe (series 4, image 137). There is an additional irregular elongated nodule of the left lower lobe measuring 1.1 x 0.5 cm (series 4, image 121) and an adjacent irregular 5 mm nodule (series 4, image 125). The right middle lobe pulmonary nodule is most suspicious for malignancy although these nodules are indeterminate. Consider PET-CT, percutaneous CT-guided biopsy, or at minimum initial follow-up CT in 3 months to establish stability or resolution. 3.  Emphysema. 4.  Coronary artery disease. These results will be called to the ordering clinician or representative by the Radiologist Assistant, and communication documented in the PACS or zVision Dashboard. Electronically Signed   By: Eddie Candle M.D.   On: 07/03/2018 09:08   Nm Pet Image Initial (pi) Skull Base To Thigh  Result Date: 07/17/2018 CLINICAL DATA:  Initial treatment strategy for pulmonary nodule. EXAM: NUCLEAR MEDICINE PET SKULL BASE TO THIGH TECHNIQUE: 7.7 mCi F-18 FDG was injected intravenously. Full-ring PET imaging was performed from the skull base to thigh after the radiotracer. CT data was obtained and used for attenuation correction and anatomic localization. Fasting blood glucose: 91 mg/dl COMPARISON:  CT chest 07/03/2018 FINDINGS: Mediastinal blood pool activity: SUV max 2.06 NECK: There is asymmetric soft tissue swelling involving the left vocal cord. Corresponding asymmetric increased uptake has an SUV max of 14.16. No hypermetabolic  cervical lymph nodes. Incidental CT findings: none CHEST: No hypermetabolic axillary, supraclavicular, mediastinal or hilar lymph nodes. The pulmonary nodule the pulmonary nodule within the right middle lobe is again noted. This measures 1.1 cm on the current exam and has an SUV max 1.13. Adjacent ground-glass attenuation and scarring is identified reflecting postinflammatory changes. The right lower lobe lung nodule described on previous exam measures 9 x 5 mm with an equivalent diameter of 7 mm, which is largely too small to reliably characterize by PET-CT. The SUV max is equal to 0.6. Small cluster of tree-in-bud nodules within the medial right lung base noted compatible with inflammatory/infectious bronchiolitis. Incidental CT findings: Moderate changes of emphysema. Bubbly debris within the right mainstem bronchus is identified which may reflect aspirated material. Bronchial wall thickening is identified bilaterally. Aortic atherosclerosis and 3 vessel coronary artery calcifications. ABDOMEN/PELVIS: No abnormal radiotracer activity identified within the liver, gallbladder, pancreas, or spleen. No abnormal uptake within the adrenal glands. Incidental CT findings: Aortic atherosclerosis with branch vessel disease. SKELETON: No focal hypermetabolic activity to suggest skeletal metastasis. Incidental CT findings: none IMPRESSION: 1. There is no significant radiotracer uptake associated with the right middle lobe and left lower lobe lung nodules. (Note: The left lower lobe lung nodule is largely too small to reliably characterize by PET-CT but has a benign appearing morphology). Findings favor of benign etiology. Recommend repeat CT of the chest in 3 months to ensure stability of these nodules as certain low-grade pulmonary neoplasms may exhibit low level FDG uptake  on PET-CT. 2. Additional small scattered nodules have a postinflammatory/infectious appearance. 3. There is abnormal increased soft tissue and  hypermetabolism associated with the left local cord. In a patient who may be at risk for head neck cancer further investigation with contrast enhanced CT or MRI of the soft tissues of the neck is advised. Additionally, direct visualization with flexible laryngoscopy may be helpful. 4. Emphysema and diffuse bronchial wall thickening compatible with COPD. 5. Aortic atherosclerosis with 3 vessel coronary artery atherosclerotic calcifications. Aortic Atherosclerosis (ICD10-I70.0) and Emphysema (ICD10-J43.9). Electronically Signed   By: Kerby Moors M.D.   On: 07/17/2018 11:24   Dg Chest Port 1 View  Result Date: 07/21/2018 CLINICAL DATA:  Shortness of breath. EXAM: PORTABLE CHEST 1 VIEW COMPARISON:  Chest CT 07/03/2018 FINDINGS: The heart is normal in size. The mediastinal and hilar contours appear normal. Emphysematous changes and hyperinflation but no definite acute overlying pulmonary process. No pleural effusions. The bony thorax is intact. IMPRESSION: Emphysematous changes and pulmonary scarring but no acute overlying pulmonary process. Electronically Signed   By: Marijo Sanes M.D.   On: 07/21/2018 20:44   Dg Swallowing Func-speech Pathology  Result Date: 07/27/2018 Objective Swallowing Evaluation: Type of Study: MBS-Modified Barium Swallow Study  Patient Details Name: Darragh Nay MRN: [id number] Date of Birth: 02-03-1956 Today's Date: 07/27/2018 Time: SLP Start Time (ACUTE ONLY): 1013 -SLP Stop Time (ACUTE ONLY): 1034 SLP Time Calculation (min) (ACUTE ONLY): 21 min Past Medical History: Past Medical History: Diagnosis Date . Bronchitis 03/2018 . Herpes  . Lung nodule 2020 . Substance abuse ([place])   opiate addiction, been on methadone for 11 years Past Surgical History: Past Surgical History: Procedure Laterality Date . DIRECT LARYNGOSCOPY N/A 07/21/2018  Procedure: DIRECT LARYNGOSCOPY, TRACHEOSTOMY, BIOPSY;  Surgeon: Leta Baptist, MD;  Location: [facility] OR;  Service: ENT;  Laterality: N/A; HPI: Pt is a  63 year old man recently found to have lung nodules and L vocal cord lesion presenting with worsening dyspnea, s/p trach 2/29. 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, emphysema, chronic bronchitis  Subjective: pt alert, hopeful for swallow study results Assessment / Plan / Recommendation CHL IP CLINICAL IMPRESSIONS 07/27/2018 Clinical Impression Pt shows minimal change from initial MBS, although perhaps with mildly increased bolus entry into the UES. As a result, thicker consistencies were also tested (honey thick liquids and purees). Unfortunately, pt continues to have inadequate airway closure, silently aspirating thin and nectar thick liquids before/during the swallow, but also silently aspirating on the residue left behind from anything nectar thick or thicker. SLP attempted various compensatory strategies, including: volitional coughs to clear the airway, chin tuck, head turns, and super supraglottic swallow. Unfortunately, aspiration could not be eliminated. Although thicker consistencies like honey thick liquids by spoon and purees were aspirated in small amounts, it would like be occurring across any PO intake. Recommend limiting POs to ice chips primarily during pharyngeal strengthening exercises and only after oral care. Longer-term alternative nutrition would be recommended if within [place]. SLP will continue to follow acutely, but would also recommend f/u post-discharge. SLP Visit Diagnosis Dysphagia, pharyngoesophageal phase (R13.14);Aphonia (R49.1) Attention and concentration deficit following -- Frontal lobe and executive function deficit following -- Impact on safety and function Severe aspiration risk;Risk for inadequate nutrition/hydration   CHL IP TREATMENT RECOMMENDATION 07/27/2018 Treatment Recommendations  Therapy as outlined in treatment  plan below   Prognosis 07/27/2018 Prognosis for Safe Diet Advancement Fair Barriers to Reach Goals Severity of deficits Barriers/Prognosis Comment -- CHL IP DIET RECOMMENDATION 07/27/2018 SLP Diet Recommendations NPO;Alternative means - long-term Liquid Administration via -- Medication Administration Via alternative means Compensations -- Postural Changes --   CHL IP OTHER RECOMMENDATIONS 07/27/2018 Recommended Consults -- Oral Care Recommendations Oral care QID Other Recommendations Have oral suction available   CHL IP FOLLOW UP RECOMMENDATIONS 07/27/2018 Follow up Recommendations Outpatient SLP   CHL IP FREQUENCY AND DURATION 07/27/2018 Speech Therapy Frequency (ACUTE ONLY) min 2x/week Treatment Duration 2 weeks      CHL IP ORAL PHASE 07/27/2018 Oral Phase WFL Oral - Pudding Teaspoon -- Oral - Pudding Cup -- Oral - Honey Teaspoon -- Oral - Honey Cup -- Oral - Nectar Teaspoon -- Oral - Nectar Cup -- Oral - Nectar Straw -- Oral - Thin Teaspoon -- Oral - Thin Cup -- Oral - Thin Straw -- Oral - Puree -- Oral - Mech Soft -- Oral - Regular -- Oral - Multi-Consistency -- Oral - Pill -- Oral Phase - Comment --  CHL IP PHARYNGEAL PHASE 07/27/2018 Pharyngeal Phase Impaired Pharyngeal- Pudding Teaspoon -- Pharyngeal -- Pharyngeal- Pudding Cup -- Pharyngeal -- Pharyngeal- Honey Teaspoon Reduced pharyngeal peristalsis;Reduced epiglottic inversion;Reduced anterior laryngeal mobility;Reduced laryngeal elevation;Reduced airway/laryngeal closure;Reduced tongue base retraction;Penetration/Apiration after swallow;Pharyngeal residue - valleculae;Pharyngeal residue - pyriform Pharyngeal Material enters airway, passes BELOW cords without attempt by patient to eject out (silent aspiration) Pharyngeal- Honey Cup -- Pharyngeal -- Pharyngeal- Nectar Teaspoon Reduced pharyngeal peristalsis;Reduced epiglottic inversion;Reduced anterior laryngeal mobility;Reduced laryngeal elevation;Reduced airway/laryngeal closure;Reduced  tongue base retraction;Penetration/Aspiration during swallow;Penetration/Apiration after swallow;Pharyngeal residue - valleculae;Pharyngeal residue - pyriform Pharyngeal Material enters airway, passes BELOW cords without attempt by patient to eject out (silent aspiration) Pharyngeal- Nectar Cup -- Pharyngeal -- Pharyngeal- Nectar Straw -- Pharyngeal -- Pharyngeal- Thin Teaspoon Reduced pharyngeal peristalsis;Reduced epiglottic inversion;Reduced anterior laryngeal mobility;Reduced laryngeal elevation;Reduced airway/laryngeal closure;Reduced tongue base retraction;Penetration/Aspiration during swallow;Penetration/Apiration after swallow;Pharyngeal residue - valleculae;Pharyngeal residue - pyriform Pharyngeal Material enters airway, passes BELOW cords without attempt by patient to eject out (silent aspiration) Pharyngeal- Thin Cup -- Pharyngeal -- Pharyngeal- Thin Straw -- Pharyngeal -- Pharyngeal- Puree Reduced pharyngeal peristalsis;Reduced epiglottic inversion;Reduced anterior laryngeal mobility;Reduced laryngeal elevation;Reduced airway/laryngeal closure;Reduced tongue base retraction;Penetration/Apiration after swallow;Pharyngeal residue - valleculae;Pharyngeal residue - pyriform Pharyngeal Material enters airway, passes BELOW cords without attempt by patient to eject out (silent aspiration) Pharyngeal- Mechanical Soft -- Pharyngeal -- Pharyngeal- Regular -- Pharyngeal -- Pharyngeal- Multi-consistency -- Pharyngeal -- Pharyngeal- Pill -- Pharyngeal -- Pharyngeal Comment --  CHL IP CERVICAL ESOPHAGEAL PHASE 07/27/2018 Cervical Esophageal Phase Impaired Pudding Teaspoon -- Pudding Cup -- Honey Teaspoon Reduced cricopharyngeal relaxation Honey Cup -- Nectar Teaspoon Reduced cricopharyngeal relaxation Nectar Cup -- Nectar Straw -- Thin Teaspoon Reduced cricopharyngeal relaxation Thin Cup -- Thin Straw -- Puree Reduced cricopharyngeal relaxation Mechanical Soft -- Regular -- Multi-consistency -- Pill -- Cervical  Esophageal Comment -- Ronald Ruiz 07/27/2018, 11:28 AM  Pollyann Glen, M.A. CCC-SLP Acute Rehabilitation Services Pager [phone] Office [phone]             Dg Swallowing Func-speech Pathology  Result Date: 07/23/2018 Objective Swallowing Evaluation: Type of Study: MBS-Modified Barium Swallow Study  Patient Details Name: Ronald Ruiz MRN: [id number] Date of Birth: 1956-04-30 Today's Date: 07/23/2018 Time: SLP Start Time (ACUTE ONLY): 8676 -SLP Stop Time (ACUTE ONLY): 1028 SLP Time Calculation (min) (ACUTE ONLY): 14 min Past Medical History: Past Medical History: Diagnosis Date . Bronchitis 03/2018 . Herpes  .  Lung nodule 2020 . Substance abuse ([place])   opiate addiction, been on methadone for 11 years Past Surgical History: No past surgical history on file. HPI: Pt is a 63 year old man recently found to have lung nodules and L vocal cord lesion presenting with worsening dyspnea, s/p trach 2/29. 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, emphysema, chronic bronchitis  Subjective: pt alert, asking about getting his methadone Assessment / Plan / Recommendation CHL IP CLINICAL IMPRESSIONS 07/23/2018 Clinical Impression Pt has a moderate-severe pharyngeal dysphagia felt to be largely related to presence of glottic mass and therefore has likely been present PTA. He has minimal hyolaryngeal movement and epiglottic inversion. His base of tongue retraction, pharyngeal squeeze, and UES opening are also reduced. He does not achieve full laryngeal vestibule closure with thin and nectar thick liquids entering the airway before the swallow. He does not trigger a cough during aspiration events, but does have some delayed throat clearing that does not eject aspirates. Cued coughing with PMV in place is not entirely effective, particularly  when larger volumes are aspirated. Pt has limited bolus entry into the UES, leaving severe residue behind even with nectar thick liquids. Compensatory strategies for clearance and airway protection were not effective, therefore thicker consistencies were not attempted. Recommend that he remain NPO with additional exercises and therapeutic ice chips with SLP to utilize his swallowing musculature, particularly since he says he may be undergoing XRT. Would consider alternative sources for medication administration/nutrition. SLP Visit Diagnosis Dysphagia, pharyngoesophageal phase (R13.14) Attention and concentration deficit following -- Frontal lobe and executive function deficit following -- Impact on safety and function Severe aspiration risk;Risk for inadequate nutrition/hydration   [facility] IP TREATMENT RECOMMENDATION 07/23/2018 Treatment Recommendations Therapy as outlined in treatment plan below   Prognosis 07/23/2018 Prognosis for Safe Diet Advancement Fair Barriers to Reach Goals Severity of deficits Barriers/Prognosis Comment -- [facility]L IP DIET RECOMMENDATION 07/23/2018 SLP Diet Recommendations NPO Liquid Administration via -- Medication Administration Via alternative means Compensations -- Postural Changes --   [facility] IP OTHER RECOMMENDATIONS 07/23/2018 Recommended Consults -- Oral Care Recommendations Oral care QID Other Recommendations Have oral suction available   [facility] IP FOLLOW UP RECOMMENDATIONS 07/23/2018 Follow up Recommendations (No Data)   [facility] IP FREQUENCY AND DURATION 07/23/2018 Speech Therapy Frequency (ACUTE ONLY) min 2x/week Treatment Duration 2 weeks      [facility]L IP ORAL PHASE 07/23/2018 Oral Phase WFL Oral - Pudding Teaspoon -- Oral - Pudding Cup -- Oral - Honey Teaspoon -- Oral - Honey Cup -- Oral - Nectar Teaspoon -- Oral - Nectar Cup -- Oral - Nectar Straw -- Oral - Thin Teaspoon -- Oral - Thin Cup -- Oral - Thin Straw -- Oral - Puree -- Oral - Mech Soft -- Oral - Regular -- Oral - Multi-Consistency -- Oral - Pill -- Oral  Phase - Comment --  CHL IP PHARYNGEAL PHASE 07/23/2018 Pharyngeal Phase Impaired Pharyngeal- Pudding Teaspoon -- Pharyngeal -- Pharyngeal- Pudding Cup -- Pharyngeal -- Pharyngeal- Honey Teaspoon -- Pharyngeal -- Pharyngeal- Honey Cup -- Pharyngeal -- Pharyngeal- Nectar Teaspoon Reduced pharyngeal peristalsis;Reduced epiglottic inversion;Reduced anterior laryngeal mobility;Reduced laryngeal elevation;Reduced airway/laryngeal closure;Reduced tongue base retraction;Penetration/Aspiration before swallow;Penetration/Apiration after swallow;Pharyngeal residue - valleculae;Pharyngeal residue - pyriform Pharyngeal Material enters airway, passes BELOW cords without attempt by patient to eject out (silent aspiration) Pharyngeal- Nectar Cup -- Pharyngeal --  Pharyngeal- Nectar Straw -- Pharyngeal -- Pharyngeal- Thin Teaspoon Reduced pharyngeal peristalsis;Reduced epiglottic inversion;Reduced anterior laryngeal mobility;Reduced laryngeal elevation;Reduced airway/laryngeal closure;Reduced tongue base retraction;Penetration/Aspiration before swallow;Penetration/Apiration after swallow;Pharyngeal residue - valleculae;Pharyngeal residue - pyriform Pharyngeal Material enters airway, passes BELOW cords without attempt by patient to eject out (silent aspiration) Pharyngeal- Thin Cup -- Pharyngeal -- Pharyngeal- Thin Straw -- Pharyngeal -- Pharyngeal- Puree -- Pharyngeal -- Pharyngeal- Mechanical Soft -- Pharyngeal -- Pharyngeal- Regular -- Pharyngeal -- Pharyngeal- Multi-consistency -- Pharyngeal -- Pharyngeal- Pill -- Pharyngeal -- Pharyngeal Comment --  CHL IP CERVICAL ESOPHAGEAL PHASE 07/23/2018 Cervical Esophageal Phase Impaired Pudding Teaspoon -- Pudding Cup -- Honey Teaspoon -- Honey Cup -- Nectar Teaspoon Reduced cricopharyngeal relaxation Nectar Cup -- Nectar Straw -- Thin Teaspoon Reduced cricopharyngeal relaxation Thin Cup -- Thin Straw -- Puree -- Mechanical Soft -- Regular -- Multi-consistency -- Pill -- Cervical Esophageal  Comment -- Ronald Ruiz 07/23/2018, 11:17 AM  Pollyann Glen, M.A. CCC-SLP Acute Rehabilitation Services Pager [phone] Office [phone]              Ct Chest Wo Contrast  Result Date: 07/03/2018 CLINICAL DATA:  Shortness of breath, hoarseness EXAM: CT CHEST WITHOUT CONTRAST TECHNIQUE: Multidetector CT imaging of the chest was performed following the standard protocol without IV contrast. COMPARISON:  Chest radiograph, 06/14/2018 FINDINGS: Cardiovascular: Three-vessel coronary artery calcifications. Normal heart size. Trace pericardial effusion. Mediastinum/Nodes: No enlarged mediastinal, hilar, or axillary lymph nodes. Frothy secretions in the trachea. Thyroid gland and esophagus demonstrate no significant findings. Lungs/Pleura: Mild centrilobular emphysema and hyperinflation. There is clustered fine nodular and irregular opacity predominantly involving medial lower lobes, central middle lobe, and lingula (series 4, image 112). There is a 1.0 cm pulmonary nodule of the most anterior inferior right middle lobe (series 4, image 137). There is an additional irregular elongated nodule of the left lower lobe measuring 1.1 x 0.5 cm (series 4, image 121) and an adjacent irregular 5 mm nodule (series 4, image 125). There are additional, very fine, tiny centrilobular nodules most conspicuous in the upper lobes. No pleural effusion or pneumothorax. Upper Abdomen: No acute abnormality. Musculoskeletal: No chest wall mass or suspicious bone lesions identified. IMPRESSION: 1. There is clustered fine nodular and irregular opacity predominantly involving medial lower lobes, central middle lobe, and lingula (series 4, image 112). Findings are most consistent with atypical infection, including atypical mycobacterium. There are additional, very fine, tiny centrilobular nodules most conspicuous in the upper lobes, which likely reflect smoking related respiratory bronchiolitis. 2. There is a 1.0 cm pulmonary nodule of the most  anterior inferior right middle lobe (series 4, image 137). There is an additional irregular elongated nodule of the left lower lobe measuring 1.1 x 0.5 cm (series 4, image 121) and an adjacent irregular 5 mm nodule (series 4, image 125). The right middle lobe pulmonary nodule is most suspicious for malignancy although these nodules are indeterminate. Consider PET-CT, percutaneous CT-guided biopsy, or at minimum initial follow-up CT in 3 months to establish stability or resolution. 3.  Emphysema. 4.  Coronary artery disease. These results will be called to the ordering clinician or representative by the Radiologist Assistant, and communication documented in the PACS or zVision Dashboard. Electronically Signed   By: Eddie Candle M.D.   On: 07/03/2018 09:08   Nm Pet Image Initial (pi) Skull Base To Thigh  Result Date: 07/17/2018 CLINICAL DATA:  Initial treatment strategy for pulmonary nodule. EXAM: NUCLEAR MEDICINE PET SKULL BASE TO THIGH TECHNIQUE: 7.7 mCi F-18 FDG was injected intravenously.  Full-ring PET imaging was performed from the skull base to thigh after the radiotracer. CT data was obtained and used for attenuation correction and anatomic localization. Fasting blood glucose: 91 mg/dl COMPARISON:  CT chest 07/03/2018 FINDINGS: Mediastinal blood pool activity: SUV max 2.06 NECK: There is asymmetric soft tissue swelling involving the left vocal cord. Corresponding asymmetric increased uptake has an SUV max of 14.16. No hypermetabolic cervical lymph nodes. Incidental CT findings: none CHEST: No hypermetabolic axillary, supraclavicular, mediastinal or hilar lymph nodes. The pulmonary nodule the pulmonary nodule within the right middle lobe is again noted. This measures 1.1 cm on the current exam and has an SUV max 1.13. Adjacent ground-glass attenuation and scarring is identified reflecting postinflammatory changes. The right lower lobe lung nodule described on previous exam measures 9 x 5 mm with an  equivalent diameter of 7 mm, which is largely too small to reliably characterize by PET-CT. The SUV max is equal to 0.6. Small cluster of tree-in-bud nodules within the medial right lung base noted compatible with inflammatory/infectious bronchiolitis. Incidental CT findings: Moderate changes of emphysema. Bubbly debris within the right mainstem bronchus is identified which may reflect aspirated material. Bronchial wall thickening is identified bilaterally. Aortic atherosclerosis and 3 vessel coronary artery calcifications. ABDOMEN/PELVIS: No abnormal radiotracer activity identified within the liver, gallbladder, pancreas, or spleen. No abnormal uptake within the adrenal glands. Incidental CT findings: Aortic atherosclerosis with branch vessel disease. SKELETON: No focal hypermetabolic activity to suggest skeletal metastasis. Incidental CT findings: none IMPRESSION: 1. There is no significant radiotracer uptake associated with the right middle lobe and left lower lobe lung nodules. (Note: The left lower lobe lung nodule is largely too small to reliably characterize by PET-CT but has a benign appearing morphology). Findings favor of benign etiology. Recommend repeat CT of the chest in 3 months to ensure stability of these nodules as certain low-grade pulmonary neoplasms may exhibit low level FDG uptake on PET-CT. 2. Additional small scattered nodules have a postinflammatory/infectious appearance. 3. There is abnormal increased soft tissue and hypermetabolism associated with the left local cord. In a patient who may be at risk for head neck cancer further investigation with contrast enhanced CT or MRI of the soft tissues of the neck is advised. Additionally, direct visualization with flexible laryngoscopy may be helpful. 4. Emphysema and diffuse bronchial wall thickening compatible with COPD. 5. Aortic atherosclerosis with 3 vessel coronary artery atherosclerotic calcifications. Aortic Atherosclerosis (ICD10-I70.0)  and Emphysema (ICD10-J43.9). Electronically Signed   By: Kerby Moors M.D.   On: 07/17/2018 11:24   Dg Chest Port 1 View  Result Date: 07/21/2018 CLINICAL DATA:  Shortness of breath. EXAM: PORTABLE CHEST 1 VIEW COMPARISON:  Chest CT 07/03/2018 FINDINGS: The heart is normal in size. The mediastinal and hilar contours appear normal. Emphysematous changes and hyperinflation but no definite acute overlying pulmonary process. No pleural effusions. The bony thorax is intact. IMPRESSION: Emphysematous changes and pulmonary scarring but no acute overlying pulmonary process. Electronically Signed   By: Marijo Sanes M.D.   On: 07/21/2018 20:44   Dg Swallowing Func-speech Pathology  Result Date: 07/27/2018 Objective Swallowing Evaluation: Type of Study: MBS-Modified Barium Swallow Study  Patient Details Name: Ronald Ruiz MRN: [id number] Date of Birth: 10/11/1955 Today's Date: 07/27/2018 Time: SLP Start Time (ACUTE ONLY): 1013 -SLP Stop Time (ACUTE ONLY): 1034 SLP Time Calculation (min) (ACUTE ONLY): 21 min Past Medical History: Past Medical History: Diagnosis Date . Bronchitis 03/2018 . Herpes  . Lung nodule 2020 . Substance abuse ([place])  opiate addiction, been on methadone for 11 years Past Surgical History: Past Surgical History: Procedure Laterality Date . DIRECT LARYNGOSCOPY N/A 07/21/2018  Procedure: DIRECT LARYNGOSCOPY, TRACHEOSTOMY, BIOPSY;  Surgeon: Leta Baptist, MD;  Location: [facility] OR;  Service: ENT;  Laterality: N/A; HPI: Pt is a 63 year old man recently found to have lung nodules and L vocal cord lesion presenting with worsening dyspnea, s/p trach 2/29. 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, emphysema, chronic bronchitis  Subjective: pt alert, hopeful for swallow study results Assessment / Plan / Recommendation CHL IP  CLINICAL IMPRESSIONS 07/27/2018 Clinical Impression Pt shows minimal change from initial MBS, although perhaps with mildly increased bolus entry into the UES. As a result, thicker consistencies were also tested (honey thick liquids and purees). Unfortunately, pt continues to have inadequate airway closure, silently aspirating thin and nectar thick liquids before/during the swallow, but also silently aspirating on the residue left behind from anything nectar thick or thicker. SLP attempted various compensatory strategies, including: volitional coughs to clear the airway, chin tuck, head turns, and super supraglottic swallow. Unfortunately, aspiration could not be eliminated. Although thicker consistencies like honey thick liquids by spoon and purees were aspirated in small amounts, it would like be occurring across any PO intake. Recommend limiting POs to ice chips primarily during pharyngeal strengthening exercises and only after oral care. Longer-term alternative nutrition would be recommended if within [place]. SLP will continue to follow acutely, but would also recommend f/u post-discharge. SLP Visit Diagnosis Dysphagia, pharyngoesophageal phase (R13.14);Aphonia (R49.1) Attention and concentration deficit following -- Frontal lobe and executive function deficit following -- Impact on safety and function Severe aspiration risk;Risk for inadequate nutrition/hydration   [facility]L IP TREATMENT RECOMMENDATION 07/27/2018 Treatment Recommendations Therapy as outlined in treatment plan below   Prognosis 07/27/2018 Prognosis for Safe Diet Advancement Fair Barriers to Reach Goals Severity of deficits Barriers/Prognosis Comment -- [facility] IP DIET RECOMMENDATION 07/27/2018 SLP Diet Recommendations NPO;Alternative means - long-term Liquid Administration via -- Medication Administration Via alternative means Compensations -- Postural Changes --   [facility] IP OTHER RECOMMENDATIONS 07/27/2018 Recommended Consults -- Oral Care Recommendations Oral care QID  Other Recommendations Have oral suction available   [facility] IP FOLLOW UP RECOMMENDATIONS 07/27/2018 Follow up Recommendations Outpatient SLP   [facility] IP FREQUENCY AND DURATION 07/27/2018 Speech Therapy Frequency (ACUTE ONLY) min 2x/week Treatment Duration 2 weeks      [facility] IP ORAL PHASE 07/27/2018 Oral Phase WFL Oral - Pudding Teaspoon -- Oral - Pudding Cup -- Oral - Honey Teaspoon -- Oral - Honey Cup -- Oral - Nectar Teaspoon -- Oral - Nectar Cup -- Oral - Nectar Straw -- Oral - Thin Teaspoon -- Oral - Thin Cup -- Oral - Thin Straw -- Oral - Puree -- Oral - Mech Soft -- Oral - Regular -- Oral - Multi-Consistency -- Oral - Pill -- Oral Phase - Comment --  [facility] IP PHARYNGEAL PHASE 07/27/2018 Pharyngeal Phase Impaired Pharyngeal- Pudding Teaspoon -- Pharyngeal -- Pharyngeal- Pudding Cup -- Pharyngeal -- Pharyngeal- Honey Teaspoon Reduced pharyngeal peristalsis;Reduced epiglottic inversion;Reduced anterior laryngeal mobility;Reduced laryngeal elevation;Reduced airway/laryngeal closure;Reduced tongue base retraction;Penetration/Apiration after swallow;Pharyngeal residue - valleculae;Pharyngeal residue - pyriform Pharyngeal Material enters airway, passes BELOW cords without attempt by patient to eject out (silent aspiration) Pharyngeal- Honey Cup -- Pharyngeal -- Pharyngeal- Nectar Teaspoon Reduced pharyngeal peristalsis;Reduced epiglottic inversion;Reduced anterior laryngeal mobility;Reduced  laryngeal elevation;Reduced airway/laryngeal closure;Reduced tongue base retraction;Penetration/Aspiration during swallow;Penetration/Apiration after swallow;Pharyngeal residue - valleculae;Pharyngeal residue - pyriform Pharyngeal Material enters airway, passes BELOW cords without attempt by patient to eject out (silent aspiration) Pharyngeal- Nectar Cup -- Pharyngeal -- Pharyngeal- Nectar Straw -- Pharyngeal -- Pharyngeal- Thin Teaspoon Reduced pharyngeal peristalsis;Reduced epiglottic inversion;Reduced anterior laryngeal mobility;Reduced laryngeal  elevation;Reduced airway/laryngeal closure;Reduced tongue base retraction;Penetration/Aspiration during swallow;Penetration/Apiration after swallow;Pharyngeal residue - valleculae;Pharyngeal residue - pyriform Pharyngeal Material enters airway, passes BELOW cords without attempt by patient to eject out (silent aspiration) Pharyngeal- Thin Cup -- Pharyngeal -- Pharyngeal- Thin Straw -- Pharyngeal -- Pharyngeal- Puree Reduced pharyngeal peristalsis;Reduced epiglottic inversion;Reduced anterior laryngeal mobility;Reduced laryngeal elevation;Reduced airway/laryngeal closure;Reduced tongue base retraction;Penetration/Apiration after swallow;Pharyngeal residue - valleculae;Pharyngeal residue - pyriform Pharyngeal Material enters airway, passes BELOW cords without attempt by patient to eject out (silent aspiration) Pharyngeal- Mechanical Soft -- Pharyngeal -- Pharyngeal- Regular -- Pharyngeal -- Pharyngeal- Multi-consistency -- Pharyngeal -- Pharyngeal- Pill -- Pharyngeal -- Pharyngeal Comment --  CHL IP CERVICAL ESOPHAGEAL PHASE 07/27/2018 Cervical Esophageal Phase Impaired Pudding Teaspoon -- Pudding Cup -- Honey Teaspoon Reduced cricopharyngeal relaxation Honey Cup -- Nectar Teaspoon Reduced cricopharyngeal relaxation Nectar Cup -- Nectar Straw -- Thin Teaspoon Reduced cricopharyngeal relaxation Thin Cup -- Thin Straw -- Puree Reduced cricopharyngeal relaxation Mechanical Soft -- Regular -- Multi-consistency -- Pill -- Cervical Esophageal Comment -- Ronald Ruiz 07/27/2018, 11:28 AM  Pollyann Glen, M.A. CCC-SLP Acute Rehabilitation Services Pager [phone] Office [phone]             Dg Swallowing Func-speech Pathology  Result Date: 07/23/2018 Objective Swallowing Evaluation: Type of Study: MBS-Modified Barium Swallow Study  Patient Details Name: Ronald Ruiz MRN: [id number] Date of Birth: 04-04-1956 Today's Date: 07/23/2018 Time: SLP Start Time (ACUTE ONLY): 8338 -SLP Stop Time (ACUTE ONLY): 1028 SLP Time  Calculation (min) (ACUTE ONLY): 14 min Past Medical History: Past Medical History: Diagnosis Date . Bronchitis 03/2018 . Herpes  . Lung nodule 2020 . Substance abuse ([place])   opiate addiction, been on methadone for 11 years Past Surgical History: No past surgical history on file. HPI: Pt is a 63 year old man recently found to have lung nodules and L vocal cord lesion presenting with worsening dyspnea, s/p trach 2/29. 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, emphysema, chronic bronchitis  Subjective: pt alert, asking about getting his methadone Assessment / Plan / Recommendation CHL IP CLINICAL IMPRESSIONS 07/23/2018 Clinical Impression Pt has a moderate-severe pharyngeal dysphagia felt to be largely related to presence of glottic mass and therefore has likely been present PTA. He has minimal hyolaryngeal movement and epiglottic inversion. His base of tongue retraction, pharyngeal squeeze, and UES opening are also reduced. He does not achieve full laryngeal vestibule closure with thin and nectar thick liquids entering the airway before the swallow. He does not trigger a cough during aspiration events, but does have some delayed throat clearing that does not eject aspirates. Cued coughing with PMV in place is not entirely effective, particularly when larger volumes are aspirated. Pt has limited bolus entry into the UES, leaving severe residue behind even with nectar thick liquids. Compensatory strategies for clearance and airway protection were not effective, therefore thicker consistencies were not attempted. Recommend that he remain NPO with additional exercises and therapeutic ice chips with SLP to utilize his swallowing musculature, particularly since he says  he may be undergoing XRT. Would consider alternative sources for  medication administration/nutrition. SLP Visit Diagnosis Dysphagia, pharyngoesophageal phase (R13.14) Attention and concentration deficit following -- Frontal lobe and executive function deficit following -- Impact on safety and function Severe aspiration risk;Risk for inadequate nutrition/hydration   [facility]L IP TREATMENT RECOMMENDATION 07/23/2018 Treatment Recommendations Therapy as outlined in treatment plan below   Prognosis 07/23/2018 Prognosis for Safe Diet Advancement Fair Barriers to Reach Goals Severity of deficits Barriers/Prognosis Comment -- [facility] IP DIET RECOMMENDATION 07/23/2018 SLP Diet Recommendations NPO Liquid Administration via -- Medication Administration Via alternative means Compensations -- Postural Changes --   [facility] IP OTHER RECOMMENDATIONS 07/23/2018 Recommended Consults -- Oral Care Recommendations Oral care QID Other Recommendations Have oral suction available   [facility] IP FOLLOW UP RECOMMENDATIONS 07/23/2018 Follow up Recommendations (No Data)   CHL IP FREQUENCY AND DURATION 07/23/2018 Speech Therapy Frequency (ACUTE ONLY) min 2x/week Treatment Duration 2 weeks      [facility]L IP ORAL PHASE 07/23/2018 Oral Phase WFL Oral - Pudding Teaspoon -- Oral - Pudding Cup -- Oral - Honey Teaspoon -- Oral - Honey Cup -- Oral - Nectar Teaspoon -- Oral - Nectar Cup -- Oral - Nectar Straw -- Oral - Thin Teaspoon -- Oral - Thin Cup -- Oral - Thin Straw -- Oral - Puree -- Oral - Mech Soft -- Oral - Regular -- Oral - Multi-Consistency -- Oral - Pill -- Oral Phase - Comment --  CHL IP PHARYNGEAL PHASE 07/23/2018 Pharyngeal Phase Impaired Pharyngeal- Pudding Teaspoon -- Pharyngeal -- Pharyngeal- Pudding Cup -- Pharyngeal -- Pharyngeal- Honey Teaspoon -- Pharyngeal -- Pharyngeal- Honey Cup -- Pharyngeal -- Pharyngeal- Nectar Teaspoon Reduced pharyngeal peristalsis;Reduced epiglottic inversion;Reduced anterior laryngeal mobility;Reduced laryngeal elevation;Reduced airway/laryngeal closure;Reduced tongue base retraction;Penetration/Aspiration  before swallow;Penetration/Apiration after swallow;Pharyngeal residue - valleculae;Pharyngeal residue - pyriform Pharyngeal Material enters airway, passes BELOW cords without attempt by patient to eject out (silent aspiration) Pharyngeal- Nectar Cup -- Pharyngeal -- Pharyngeal- Nectar Straw -- Pharyngeal -- Pharyngeal- Thin Teaspoon Reduced pharyngeal peristalsis;Reduced epiglottic inversion;Reduced anterior laryngeal mobility;Reduced laryngeal elevation;Reduced airway/laryngeal closure;Reduced tongue base retraction;Penetration/Aspiration before swallow;Penetration/Apiration after swallow;Pharyngeal residue - valleculae;Pharyngeal residue - pyriform Pharyngeal Material enters airway, passes BELOW cords without attempt by patient to eject out (silent aspiration) Pharyngeal- Thin Cup -- Pharyngeal -- Pharyngeal- Thin Straw -- Pharyngeal -- Pharyngeal- Puree -- Pharyngeal -- Pharyngeal- Mechanical Soft -- Pharyngeal -- Pharyngeal- Regular -- Pharyngeal -- Pharyngeal- Multi-consistency -- Pharyngeal -- Pharyngeal- Pill -- Pharyngeal -- Pharyngeal Comment --  CHL IP CERVICAL ESOPHAGEAL PHASE 07/23/2018 Cervical Esophageal Phase Impaired Pudding Teaspoon -- Pudding Cup -- Honey Teaspoon -- Honey Cup -- Nectar Teaspoon Reduced cricopharyngeal relaxation Nectar Cup -- Nectar Straw -- Thin Teaspoon Reduced cricopharyngeal relaxation Thin Cup -- Thin Straw -- Puree -- Mechanical Soft -- Regular -- Multi-consistency -- Pill -- Cervical Esophageal Comment -- Ronald Ruiz 07/23/2018, 11:17 AM  Pollyann Glen, M.A. [place] Acute Rehabilitation Services Pager [phone] Office [phone]             PATHOLOGY: Diagnosis Vocal cord, biopsy, Left - INVASIVE SQUAMOUS CELL CARCINOMA. Microscopic Comment Dr. Vic Ripper agrees. Called to Dr. Benjamine Mola on 07/24/2018. (JDP:kh 07/24/2018) Claudette Laws MD Pathologist, Electronic Signature (Case signed 07/24/2018)  Assessment and Plan:  This is a 63 year old male with:  1.  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  Review of the operative report indicates this is likely a stage T3 tumor.  The patient has a previous CT scan without contrast performed on 07/03/2018  which indicated there was a 1.0 cm pulmonary nodule in the most anterior inferior right middle lobe as well as an additional irregular elongated nodule of the left lower lobe measuring 1.1 x 0.5 cm and an adjacent irregular 5 mm nodule.  The right middle lobe pulmonary nodule was suspicious for malignancy although the nodules were indeterminate.  PET scan performed on 07/17/2018 showed no significant radiotracer uptake associated with the right middle lobe and left lower lobe lung nodules.  Findings favored benign etiology but a repeat CT scan of the chest in 3 months was recommended to ensure stability.  I talked with the patient and his daughter about his diagnosis and treatment options including radiation combined with chemotherapy.  We discussed that treatment would begin on outpatient basis.  He will be seen by Dr. Irene Limbo later today.  2.  Thrombocytopenia.  This is very mild.  Will continue to monitor.  3.  Emphysema and chronic bronchitis.  Smoking cessation was discussed with the patient and his daughter.  He does not plan to continue smoking upon discharge.  Continue management per pulmonology.   Thank you for this referral.   Mikey Bussing, DNP, AGPCNP-BC, AOCNP  ADDENDUM  .Patient was Personally and independently interviewed, examined and relevant elements of the history of present illness were reviewed in details and an assessment and plan was created. All elements of the patient's history of present illness , assessment and plan were discussed in details with Freida Busman NP. The above documentation reflects our combined findings assessment and plan.  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 Discussed with Dr. Benjamine Mola at bedside patient is not a candidate for larynx sparing surgery. He has lost about 40 pounds and is nutritionally quite depleted. Heavy smoking history of 1 to 2 packs/day with COPD /emphysema.  Not on home oxygen. 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 Has tolerated his tracheostomy well and is been able to speak with Passy-Muir valve. Has had issues with swallowing and is still needing tube feeding. Plan -I discussed in details his recent PET CT scan and other imaging studies.  Though he has multiple pulmonary nodules none of them are FDG avid.  No FDG avid cervical lymphadenopathy.  FDG avid laryngeal tumor noted. -We discussed that Dr. Benjamine Mola does not feel he is a candidate for larynx sparing surgery and his local tumor is at least a T3. -No evidence of overt metastatic disease.  We will treat this as a T3 N0 disease. -He is scheduled for G-tube placement on Monday and will need nutritional input to optimize his nutritional status with tube feeding. -We discussed treatment options as per NCCN criteria.  He is appropriately keen on a larynx sparing approach. -Discussed radiation therapy alone palliatively versus possibility of chemoradiation. -He prefers to go with a more definitive and more aggressive approach with chemoradiation. -Head and neck navigator is involved. -Patient will need radiation oncology input. -I do not believe he will tolerate high-dose cisplatin as concurrent chemotherapy.  Would likely plan to do dose adjusted carboplatin/taxol weekly to allow for more control of his clinical course and treatment. -Appreciate ENT input and other services and nursing staff further excellent care. -He will need to be set up for outpatient follow-up with medical oncology, radiation oncology, ENT and nutritional  therapy on discharge. -  All the patient's and his children's questions and concerns were addressed in detail at bedside.  Sullivan Lone MD MS

## 2018-07-29 LAB — GLUCOSE, CAPILLARY
GLUCOSE-CAPILLARY: 85 mg/dL (ref 70–99)
Glucose-Capillary: 109 mg/dL — ABNORMAL HIGH (ref 70–99)
Glucose-Capillary: 80 mg/dL (ref 70–99)
Glucose-Capillary: 85 mg/dL (ref 70–99)
Glucose-Capillary: 87 mg/dL (ref 70–99)
Glucose-Capillary: 90 mg/dL (ref 70–99)

## 2018-07-29 MED ORDER — IPRATROPIUM BROMIDE 0.02 % IN SOLN
0.5000 mg | Freq: Two times a day (BID) | RESPIRATORY_TRACT | Status: DC
  Administered 2018-07-30 – 2018-08-03 (×9): 0.5 mg via RESPIRATORY_TRACT
  Filled 2018-07-29 (×9): qty 2.5
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
## 2018-07-30 ENCOUNTER — Other Ambulatory Visit: Payer: Self-pay | Admitting: Family Medicine

## 2018-07-30 DIAGNOSIS — R1310 Dysphagia, unspecified: Secondary | ICD-10-CM

## 2018-07-30 LAB — GLUCOSE, CAPILLARY
GLUCOSE-CAPILLARY: 110 mg/dL — AB (ref 70–99)
Glucose-Capillary: 114 mg/dL — ABNORMAL HIGH (ref 70–99)
Glucose-Capillary: 116 mg/dL — ABNORMAL HIGH (ref 70–99)
Glucose-Capillary: 87 mg/dL (ref 70–99)

## 2018-07-30 MED ORDER — IBUPROFEN 100 MG/5ML PO SUSP
400.0000 mg | Freq: Three times a day (TID) | ORAL | Status: DC | PRN
  Administered 2018-07-30 – 2018-08-03 (×3): 600 mg
  Filled 2018-07-30 (×4): qty 30

## 2018-07-30 MED ORDER — PROCHLORPERAZINE MALEATE 5 MG PO TABS: 5.0000 mg | ORAL_TABLET | Freq: Four times a day (QID) | ORAL | Status: DC | PRN

## 2018-07-30 MED ORDER — ACETAMINOPHEN 160 MG/5ML PO SOLN: 650.0000 mg | ORAL | Status: DC | PRN

## 2018-07-30 MED ORDER — VITAL AF 1.2 CAL PO LIQD
1000.0000 mL | ORAL | Status: DC
  Administered 2018-07-30 – 2018-08-01 (×2): 1000 mL
  Filled 2018-07-30 (×9): qty 1000

## 2018-07-30 MED ORDER — METHADONE HCL 10 MG PO TABS
80.0000 mg | ORAL_TABLET | Freq: Every day | ORAL | Status: DC
  Administered 2018-07-31 – 2018-08-03 (×3): 80 mg
  Filled 2018-07-30 (×3): qty 8

## 2018-07-30 NOTE — Progress Notes (Signed)
Nutrition Follow Up  DOCUMENTATION CODES:   Severe malnutrition in context of chronic illness  INTERVENTION:    Increase Vital AF 1.2 to goal rate of 85 ml/h  Provides 2448 kcal, 153 gm protein, 1654 ml free water daily  NUTRITION DIAGNOSIS:   Severe Malnutrition related to chronic illness(COPD with new vocal cord mass (suspected malignancy)) as evidenced by severe fat depletion, severe muscle depletion, percent weight loss(7% weight loss in one month), ongoing  GOAL:   Patient will meet greater than or equal to 90% of their needs, met  MONITOR:   TF tolerance, Skin, Labs, Weight trends, I & O's  ASSESSMENT:   63 yo male with PMH of opiate addiction (on methadone x 11 years), smoker, bronchitis, COPD, lung nodule who was transferred from [facility] to [facility] with respiratory difficulty, left vocal cord mass.    2/29 s/p trach, laryngoscopy and biopsy per ENT 3/02 s/p MBSS >> moderate-severe dysphagia 3/02 Cortrak placed >> tip at stomach 3/03 transferred out of [facility]  Pt s/p MBSS 3/6; SLP recommending NPO status. He will need long term nutritional support (ie G-tube/PEG tube). Pt's weight trending down; will increase rate of TF formula. Labs & medications reviewed. CBG's [id number].   Diet Order:   Diet Order            Diet NPO time specified Except for: Sips with Meds  Diet effective now             EDUCATION NEEDS:   No education needs have been identified at this time  Skin:  Skin Assessment: Reviewed RN Assessment  Last BM:  3/8  Height:   Ht Readings from Last 1 Encounters:  07/21/18 6' (1.829 m)   Weight:   Wt Readings from Last 1 Encounters:  07/30/18 63.3 kg   Ideal Body Weight:  80.9 kg  BMI:  Body mass index is 18.93 kg/m.  Estimated Nutritional Needs:   Kcal:  5015-8682  Protein:  100-120 gm  Fluid:  2 L  Arthur Holms, RD, LDN Pager #: [phone] After-Hours Pager #: [phone]

## 2018-07-30 NOTE — Progress Notes (Signed)
[place] TEAM 1 - Stepdown/ICU TEAM  Dashan Chizmar  ZOX:[id number] DOB: 12-06-1955 DOA: 07/21/2018 PCP: [name], MD    Brief Narrative:  63yo w/ a hx of substance abuse on methadone, emphysema, chronic bronchitis, recently found lung nodules and a vocal cord lesion who presented with worsening dyspnea. He had a CT chest 2/11 for persisting cough and was found to have clustered fine nodular and irregular opacities in medial lower lobes, central middle lobe, and lingula. He was found to have a 1cm pulmonary nodule in the right middle lobe and an irregular nodule in the left lower lobe measuring 1.1 cm. He was seen by Pulmonary on 2/14 (Mannam), and had a PET scan 2/25 which noted no significant uptake in the lung nodules, but abnormal increased signal in the left vocal cord. He experienced worsening trouble breathing, and reported a 35 lb weight loss over 7 months.   Significant Events: 2/29 admit - to OR for trach and bx  3/5 trach changed to uncuffed #6 Shiley   Subjective: Patient is doing well.  He states he is very tired of being in the hospital.  He denies chest pain nausea vomiting or abdominal pain.  Assessment & Plan:  Invasive SCCA of larynx  Diagnosed by surgical bx of mass - plan is for XRT and chemo, to begin as outpt - not a candidate for larynx sparing surgery - has been seen by Oncology   Dyspnea, vocal cord mass, lung nodules Status post tracheostomy by ENT - doing well w/ PMV - no further surgery planned   Dysphagia Has repeatedly done poorly w/ SLP evals, to include a MBS on 3/6 - Cortrak in place for now - awaiting PEG, which hopefully IR will be able to place - if not GI consult will be required   COPD Continue Brovana Pulmicort and Atrovent - stable at this time - CT 07/03/18 noted signif change of emphysema as well as lung nodules (not PET avid)  Hx of Substance abuse > on methadone Methadone stable at current dose, though not yet back to home dose -  no plan to change dosing at this time    DVT prophylaxis: lovenox  Code Status: FULL CODE Family Communication: spoke w/ family at bedside   Disposition Plan: stable for med/surg bed   Consultants:  ENT Oncology  PCCM IR  Antimicrobials:  none   Objective: Blood pressure 106/60, pulse 66, temperature 98.5 F (36.9 C), temperature source Oral, resp. rate 16, height 6' (1.829 m), weight 63.3 kg, SpO2 99 %.  Intake/Output Summary (Last 24 hours) at 07/30/2018 0913 Last data filed at 07/30/2018 0300 Gross per 24 hour  Intake -  Output 1225 ml  Net -1225 ml   Filed Weights   07/28/18 0450 07/29/18 0500 07/30/18 0500  Weight: 61.9 kg 62.9 kg 63.3 kg    Examination: General: No acute respiratory distress  Lungs: Clear to auscultation bilaterally  Cardiovascular: RRR  Abdomen: Nontender, nondistended, soft, BS+ Extremities: No edema B LE   CBC: Recent Labs  Lab 07/24/18 0322 07/25/18 0251 07/27/18 0246  WBC 10.7* 10.2 9.4  NEUTROABS  --   --  5.3  HGB 13.6 13.4 11.9*  HCT 41.5 40.2 37.3*  MCV 93.5 94.4 95.4  PLT 143* 131* 811   Basic Metabolic Panel: Recent Labs  Lab 07/23/18 1635 07/24/18 0322 07/25/18 0251  NA  --   --  138  K  --   --  3.6  CL  --   --  102  CO2  --   --  29  GLUCOSE  --   --  108*  BUN  --   --  22  CREATININE  --   --  0.78  CALCIUM  --   --  8.9  MG 2.1 2.1 2.1  PHOS 3.2 3.1  --    GFR: Estimated Creatinine Clearance: 85.7 mL/min (by C-G formula based on SCr of 0.78 mg/dL).  Liver Function Tests: Recent Labs  Lab 07/25/18 0251  AST 14*  ALT 13  ALKPHOS 50  BILITOT 0.5  PROT 6.5  ALBUMIN 3.0*    CBG: Recent Labs  Lab 07/29/18 1751 07/29/18 1951 07/30/18 0010 07/30/18 0415 07/30/18 0740  GLUCAP 85 87 114* 116* 110*    Recent Results (from the past 240 hour(s))  MRSA PCR Screening     Status: None   Collection Time: 07/21/18 10:14 PM  Result Value Ref Range Status   MRSA by PCR NEGATIVE NEGATIVE Final     Comment:        The GeneXpert MRSA Assay (FDA approved for NASAL specimens only), is one component of a comprehensive MRSA colonization surveillance program. It is not intended to diagnose MRSA infection nor to guide or monitor treatment for MRSA infections. Performed at [facility], [place] [address], [place], [place] [address]      Scheduled Meds: . arformoterol  15 mcg Nebulization BID  . budesonide (PULMICORT) nebulizer solution  0.25 mg Nebulization BID  . chlorhexidine  15 mL Mouth Rinse BID  . enoxaparin (LOVENOX) injection  40 mg Subcutaneous Q24H  . ipratropium  0.5 mg Nebulization BID  . mouth rinse  15 mL Mouth Rinse q12n4p  . methadone  80 mg Oral Daily  . nicotine  21 mg Transdermal Daily     LOS: 9 days   Cherene Altes, MD [facility] Office  [phone] Pager - Text Page per Amion  If 7PM-7AM, please contact night-coverage per Amion 07/30/2018, 9:13 AM

## 2018-07-30 NOTE — Progress Notes (Signed)
  Speech Language Pathology Treatment: Dysphagia  Patient Details Name: Ronald Ruiz MRN: [id number] DOB: Mar 18, 1956 Today's Date: 07/30/2018 Time: 4315-4008 SLP Time Calculation (min) (ACUTE ONLY): 10 min  Assessment / Plan / Recommendation Clinical Impression  Pt is using PMV independently, wearing valve all waking hours, and able to donn/doff/clean valve without assist.  Daughter present.  Pt is awaiting PEG, ready to go home.  Voice is mildly hoarse but with strong intensity and fully intelligible.  VS stable throughout use. Pt is pleased to have ice chips, understands and is able to verbalize results of last Friday's repeat MBS; not happy with findings of ongoing aspiration.  Pt will benefit from OP SLP (neuro clinic on [address]) for pre-radiation counseling, education, and home exercise program to be done in conjunction with Rx.  D/W Dr. Thereasa Solo. SLP will follow acutely pending D/C.   HPI HPI: Pt is a 63 year old man recently found to have lung nodules and L vocal cord lesion presenting with worsening dyspnea, s/p trach 2/29. 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, emphysema, chronic bronchitis      SLP Plan  Continue with current plan of care       Recommendations  Diet recommendations: (ce chips)      Patient may use Passy-Muir Speech Valve: During all waking hours (remove during sleep) PMSV Supervision: Intermittent         SLP Visit Diagnosis: Dysphagia, pharyngoesophageal phase (R13.14);Aphonia (R49.1) Plan: Continue with current plan of care       [place]. Tivis Ringer, Vass CCC/SLP Acute Rehabilitation Services Office number [phone] Pager [phone]  Ronald Ruiz 07/30/2018, 11:21 AM

## 2018-07-30 NOTE — Progress Notes (Signed)
Physical Therapy Treatment Patient Details Name: Ronald Ruiz MRN: [id number] DOB: 1955/11/17 Today's Date: 07/30/2018    History of Present Illness 63yo w/ a hx of substance abuse on methadone, emphysema, chronic bronchitis, recently found lung nodules and a vocal cord lesion who presented with worsening dyspnea. He had a CT chest 2/11 for persisting cough and was found to have clustered fine nodular and irregular opacities in medial lower lobes, central middle lobe, and lingula. Path report pending. Trach 07/21/2018.     PT Comments    Pt continues to need min guard assist for gait and stair training.  VSS throughout mobility and his gait distance is strong.  He would benefit from OP PT as he is progressing out of home therapy levels at this time and his main deficits are balance. PT will continue to follow acutely for safe mobility progression.  Please preform a DGI next session.   Follow Up Recommendations  Supervision for mobility/OOB;Outpatient PT     Equipment Recommendations  None recommended by PT    Recommendations for Other Services   NA     Precautions / Restrictions Precautions Precautions: Fall Precaution Comments: mildly unsteady on his feet, trach with PMV    Mobility  Bed Mobility Overal bed mobility: Modified Independent                Transfers Overall transfer level: Needs assistance Equipment used: None Transfers: Sit to/from Stand Sit to Stand: Supervision         General transfer comment: supervision for safety, reliant on hands for transitions.   Ambulation/Gait Ambulation/Gait assistance: Min guard Gait Distance (Feet): 300 Feet Assistive device: None Gait Pattern/deviations: Step-through pattern;Staggering right;Staggering left   Gait velocity interpretation: >2.62 ft/sec, indicative of community ambulatory General Gait Details: pt with mildly staggering gait pattern, has on bedroom slippers that are a bit big for him and I think  they are causing some of his imbalance on top of some imbalance normally.    Stairs Stairs: Yes Stairs assistance: Min guard Stair Management: One rail Right;Alternating pattern;Forwards Number of Stairs: 10 General stair comments: Pt needed min guard assist as he kept catching his toe on the edge of the step making hip trip 3-4 times while ascending the stairs, no issues descending.           Balance Overall balance assessment: Needs assistance Sitting-balance support: Feet supported;No upper extremity supported Sitting balance-Leahy Scale: Good     Standing balance support: No upper extremity supported Standing balance-Leahy Scale: Fair                              Cognition Arousal/Alertness: Awake/alert Behavior During Therapy: WFL for tasks assessed/performed Overall Cognitive Status: Within Functional Limits for tasks assessed                                           General Comments General comments (skin integrity, edema, etc.): VSS throughout session on RA      Pertinent Vitals/Pain Pain Assessment: No/denies pain           PT Goals (current goals can now be found in the care plan section) Acute Rehab PT Goals Patient Stated Goal: to go home Progress towards PT goals: Progressing toward goals    Frequency    Min 3X/week  PT Plan Discharge plan needs to be updated       AM-PAC PT "6 Clicks" Mobility   Outcome Measure  Help needed turning from your back to your side while in a flat bed without using bedrails?: None Help needed moving from lying on your back to sitting on the side of a flat bed without using bedrails?: None Help needed moving to and from a bed to a chair (including a wheelchair)?: None Help needed standing up from a chair using your arms (e.g., wheelchair or bedside chair)?: None Help needed to walk in hospital room?: A Little Help needed climbing 3-5 steps with a railing? : A Little 6 Click  Score: 22    End of Session Equipment Utilized During Treatment: Gait belt Activity Tolerance: Patient tolerated treatment well Patient left: in chair;with call bell/phone within reach;with family/visitor present   PT Visit Diagnosis: Muscle weakness (generalized) (M62.81);Difficulty in walking, not elsewhere classified (R26.2);Unsteadiness on feet (R26.81)     Time: 1660-6004 PT Time Calculation (min) (ACUTE ONLY): 10 min  Charges:  $Gait Training: 8-22 mins                    Nehal Witting B. Eugene Isadore, PT, DPT  Acute Rehabilitation [phone] pager #[phone] office   07/30/2018, 1:45 PM

## 2018-07-31 DIAGNOSIS — J449 Chronic obstructive pulmonary disease, unspecified: Secondary | ICD-10-CM | POA: Diagnosis present

## 2018-07-31 DIAGNOSIS — R1310 Dysphagia, unspecified: Secondary | ICD-10-CM

## 2018-07-31 DIAGNOSIS — F1910 Other psychoactive substance abuse, uncomplicated: Secondary | ICD-10-CM | POA: Diagnosis present

## 2018-07-31 DIAGNOSIS — J387 Other diseases of larynx: Secondary | ICD-10-CM | POA: Diagnosis present

## 2018-07-31 HISTORY — DX: Dysphagia, unspecified: R13.10

## 2018-07-31 LAB — CBC
HCT: 37.4 % — ABNORMAL LOW (ref 39.0–52.0)
Hemoglobin: 12.5 g/dL — ABNORMAL LOW (ref 13.0–17.0)
MCH: 31.7 pg (ref 26.0–34.0)
MCHC: 33.4 g/dL (ref 30.0–36.0)
MCV: 94.9 fL (ref 80.0–100.0)
Platelets: 205 10*3/uL (ref 150–400)
RBC: 3.94 MIL/uL — ABNORMAL LOW (ref 4.22–5.81)
RDW: 13 % (ref 11.5–15.5)
WBC: 8.2 10*3/uL (ref 4.0–10.5)
nRBC: 0 % (ref 0.0–0.2)

## 2018-07-31 LAB — COMPREHENSIVE METABOLIC PANEL
ALT: 26 U/L (ref 0–44)
AST: 26 U/L (ref 15–41)
Albumin: 2.7 g/dL — ABNORMAL LOW (ref 3.5–5.0)
Alkaline Phosphatase: 58 U/L (ref 38–126)
Anion gap: 9 (ref 5–15)
BILIRUBIN TOTAL: 0.5 mg/dL (ref 0.3–1.2)
BUN: 23 mg/dL (ref 8–23)
CALCIUM: 9.1 mg/dL (ref 8.9–10.3)
CO2: 30 mmol/L (ref 22–32)
CREATININE: 0.74 mg/dL (ref 0.61–1.24)
Chloride: 98 mmol/L (ref 98–111)
GFR calc Af Amer: >60 mL/min (ref >60)
GFR calc non Af Amer: >60 mL/min (ref >60)
Glucose, Bld: 99 mg/dL (ref 70–99)
Potassium: 3.8 mmol/L (ref 3.5–5.1)
Sodium: 137 mmol/L (ref 135–145)
TOTAL PROTEIN: 6.2 g/dL — AB (ref 6.5–8.1)

## 2018-07-31 LAB — APTT: aPTT: 32 seconds (ref 24–36)

## 2018-07-31 LAB — PROTIME-INR
INR: 1 (ref 0.8–1.2)
Prothrombin Time: 13.3 seconds (ref 11.4–15.2)

## 2018-07-31 NOTE — Progress Notes (Signed)
Physical Therapy Treatment Patient Details Name: Steel Kerney MRN: [id number] DOB: 01-Feb-1956 Today's Date: 07/31/2018    History of Present Illness 63yo w/ a hx of substance abuse on methadone, emphysema, chronic bronchitis, recently found lung nodules and a vocal cord lesion who presented with worsening dyspnea. He had a CT chest 2/11 for persisting cough and was found to have clustered fine nodular and irregular opacities in medial lower lobes, central middle lobe, and lingula. Path report pending. Trach 07/21/2018.     PT Comments    Patient is ambulating unit and stairwell without assistance. Became fatigued after 1 flight of stairs, short rest break and continued on. VSS on RA at all times. DGI 16/24 due to strength and mild balance impairments.      Follow Up Recommendations   Supervision with mobility      Equipment Recommendations       Recommendations for Other Services       Precautions / Restrictions Precautions Precautions: Fall Restrictions Weight Bearing Restrictions: No    Mobility  Bed Mobility Overal bed mobility: Modified Independent                Transfers Overall transfer level: Needs assistance Equipment used: None Transfers: Sit to/from Stand Sit to Stand: Supervision         General transfer comment: supervision for safety, reliant on hands for transitions.   Ambulation/Gait Ambulation/Gait assistance: Min guard Gait Distance (Feet): 500 Feet Assistive device: None Gait Pattern/deviations: Step-through pattern;Staggering right;Staggering left Gait velocity: decreaseed   General Gait Details: pt with mildly staggering gait pattern, has on bedroom slippers that are a bit big for him and I think they are causing some of his imbalance on top of some imbalance normally.    Stairs       Number of Stairs: 10     Wheelchair Mobility    Modified Rankin (Stroke Patients Only)       Balance Overall balance assessment:  Needs assistance Sitting-balance support: Feet supported;No upper extremity supported Sitting balance-Leahy Scale: Good     Standing balance support: No upper extremity supported Standing balance-Leahy Scale: Fair Standing balance comment: static standing with close supervision, dynamic activities with min guard/min assist.                  Standardized Balance Assessment Standardized Balance Assessment : Dynamic Gait Index   Dynamic Gait Index Level Surface: Mild Impairment Change in Gait Speed: Mild Impairment Gait with Horizontal Head Turns: Mild Impairment Gait with Vertical Head Turns: Mild Impairment Gait and Pivot Turn: Mild Impairment Step Over Obstacle: Mild Impairment Step Around Obstacles: Mild Impairment Steps: Mild Impairment Total Score: 16      Cognition Arousal/Alertness: Awake/alert Behavior During Therapy: WFL for tasks assessed/performed Overall Cognitive Status: Within Functional Limits for tasks assessed                                        Exercises      General Comments General comments (skin integrity, edema, etc.): VSS on RA       Pertinent Vitals/Pain Pain Assessment: No/denies pain    Home Living                      Prior Function            PT Goals (current goals can now be found in the care  plan section) Acute Rehab PT Goals Patient Stated Goal: to go home PT Goal Formulation: With patient/family Time For Goal Achievement: 08/06/18 Progress towards PT goals: Progressing toward goals    Frequency    Min 3X/week      PT Plan Discharge plan needs to be updated    Co-evaluation              AM-PAC PT "6 Clicks" Mobility   Outcome Measure  Help needed turning from your back to your side while in a flat bed without using bedrails?: None Help needed moving from lying on your back to sitting on the side of a flat bed without using bedrails?: None Help needed moving to and from a bed  to a chair (including a wheelchair)?: None Help needed standing up from a chair using your arms (e.g., wheelchair or bedside chair)?: None Help needed to walk in hospital room?: A Little Help needed climbing 3-5 steps with a railing? : A Little 6 Click Score: 22    End of Session Equipment Utilized During Treatment: Gait belt Activity Tolerance: Patient tolerated treatment well Patient left: in chair;with call bell/phone within reach;with family/visitor present Nurse Communication: Mobility status PT Visit Diagnosis: Muscle weakness (generalized) (M62.81);Difficulty in walking, not elsewhere classified (R26.2);Unsteadiness on feet (R26.81)     Time: 1810-1830 PT Time Calculation (min) (ACUTE ONLY): 20 min  Charges:  $Gait Training: 8-22 mins                     Reinaldo Berber, PT, DPT Acute Rehabilitation Services Pager: [phone] Office: [phone]     Reinaldo Berber 07/31/2018, 6:24 PM

## 2018-07-31 NOTE — Progress Notes (Signed)
[facility] PROGRESS NOTE  [name] OHY:[id number] DOB: 05-12-1956 DOA: 07/21/2018 PCP: [name], MD  Assessment/Plan: Newly diagnosed Invasive squamous cell laryngeal carcinoma.  Recent outpatient CT chest imaging concerning for pulmonary nodule that could be malignancy though PET scan showed no uptake making benign lesion more likely. No evidence of metastatic disease -repeat CT chest in 3 months per oncology -begin outpatient chemoradiation as outpatient -will outpt f/u w/ med onc, rad onc, ENT, nutritional therapy on discharge  Dysphagia. Severe aspiration risk, at risk for inadequate nutrition/hydration per MBS/Speech evaluation.  CT abd imaging colon anterior to stomach, may not be a candidate for percutaneous g tube placement. 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 Doing well with passy-muir valve - no further surgical plans  Thrombocytopenia, resolved Baseline 188, nadir of 144, currently 205. -closely monitor  COPD. Hx of emphysema and chronic bronchitis. Clear breath sounds -encourage smoking cessation -continued scheduled inhalers  Hx of substance abuse -continue current methadone dose  Code Status: Full code Family Communication: no family at bedside (indicate person spoken with, relationship, and if by phone, the number) Disposition Plan: plan for IR to attempt peg tube on 3/11 or 3/12, if not able due to anatomy will need Surgery consult for placement   Consultants:  ENT, Oncology, PCCM, IR  Procedures:  Flexible Larygnoscopy 2/29  Tracheostomy 2/29  Antibiotics: none HPI/Subjective:  Ronald Ruiz is a 63 y.o. year old male with medical history significant for substance abuse on methadone, emphysema, chronic bronchitis,COPD, and recently found lung nodules and a  vocal cord lesion with > 40 lb weight loss in 6 months with outpatient workup including PET CT showing vocal cord mass with increased SUV uptake suggestive of malignancy. 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    Interim Hx:  He is currently awaiting long term plan for nutrition with peg tube. Ir plans to attempt either 3/11 or 3/12  Tolerating PMV No chest pain Non-productive cough, no fevers or chills, or SOB    Objective: Vitals:   07/31/18 0034 07/31/18 0036  BP: 120/66   Pulse: (!) 57 (!) 57  Resp:    Temp: 98.2 F (36.8 C)   SpO2: 100% 100%    Intake/Output Summary (Last 24 hours) at 07/31/2018 0543 Last data filed at 07/31/2018 9485 Gross per 24 hour  Intake 365 ml  Output 375 ml  Net -10 ml   Filed Weights   07/28/18 0450 07/29/18 0500 07/30/18 0500  Weight: 61.9 kg 62.9 kg 63.3 kg    Exam:   General: Lying in bed, no distress, frail elderly gentleman   Cardiovascular: Regular rate and rhythm, no murmurs, no edema  Respiratory: Normal respiratory effort, clear breath sounds bilaterally  Abdomen: Soft, nondistended, nontender  Musculoskeletal: Normal range of motion  Skin no rashes or lesions  Neurologic alert and oriented x4, no appreciable focal deficits.  Data Reviewed: Basic Metabolic Panel: Recent Labs  Lab 07/25/18 0251 07/31/18 0256  NA 138 137  K 3.6 3.8  CL 102 98  CO2 29 30  GLUCOSE 108* 99  BUN 22 23  CREATININE 0.78 0.74  CALCIUM 8.9 9.1  MG 2.1  --    Liver Function Tests: Recent Labs  Lab 07/25/18 0251 07/31/18 0256  AST 14* 26  ALT 13 26  ALKPHOS 50 58  BILITOT 0.5 0.5  PROT 6.5 6.2*  ALBUMIN 3.0* 2.7*   No results for  input(s): LIPASE, AMYLASE in the last 168 hours. No results for input(s): AMMONIA in the last 168 hours. CBC: Recent Labs  Lab 07/25/18 0251 07/27/18 0246 07/31/18 0256  WBC 10.2 9.4 8.2  NEUTROABS  --  5.3  --   HGB 13.4 11.9* 12.5*  HCT 40.2 37.3* 37.4*  MCV 94.4 95.4 94.9  PLT 131* 169 205   Cardiac Enzymes: No results for input(s): CKTOTAL, CKMB, CKMBINDEX, TROPONINI in the last 168 hours. BNP (last 3 results) No results for input(s): BNP in the last 8760 hours.  ProBNP (last 3 results) No results for input(s): PROBNP in the last 8760 hours.  CBG: Recent Labs  Lab 07/29/18 1751 07/29/18 1951 07/30/18 0010 07/30/18 0415 07/30/18 0740  GLUCAP 85 87 114* 116* 110*    Recent Results (from the past 240 hour(s))  MRSA PCR Screening     Status: None   Collection Time: 07/21/18 10:14 PM  Result Value Ref Range Status   MRSA by PCR NEGATIVE NEGATIVE Final    Comment:        The GeneXpert MRSA Assay (FDA approved for NASAL specimens only), is one component of a comprehensive MRSA colonization surveillance program. It is not intended to diagnose MRSA infection nor to guide or monitor treatment for MRSA infections. Performed at [facility], [place] [address] [place], [place] [address]      Studies: No results found.  Scheduled Meds: . arformoterol  15 mcg Nebulization BID  . budesonide (PULMICORT) nebulizer solution  0.25 mg Nebulization BID  . chlorhexidine  15 mL Mouth Rinse BID  . enoxaparin (LOVENOX) injection  40 mg Subcutaneous Q24H  . ipratropium  0.5 mg Nebulization BID  . mouth rinse  15 mL Mouth Rinse q12n4p  . methadone  80 mg Per Tube Daily  . nicotine  21 mg Transdermal Daily   Continuous Infusions: . feeding supplement (VITAL AF 1.2 CAL) 1,000 mL (07/30/18 2030)    Principal Problem:   Malignant neoplasm of glottis (HCC) Active Problems:   Protein-calorie malnutrition, severe      Shayla D Nettey  [facility]

## 2018-08-01 ENCOUNTER — Ambulatory Visit: Payer: BLUE CROSS/BLUE SHIELD | Admitting: Nurse Practitioner

## 2018-08-01 DIAGNOSIS — D649 Anemia, unspecified: Secondary | ICD-10-CM

## 2018-08-01 DIAGNOSIS — D696 Thrombocytopenia, unspecified: Secondary | ICD-10-CM

## 2018-08-01 LAB — COMPREHENSIVE METABOLIC PANEL
ALT: 25 U/L (ref 0–44)
AST: 22 U/L (ref 15–41)
Albumin: 2.8 g/dL — ABNORMAL LOW (ref 3.5–5.0)
Alkaline Phosphatase: 53 U/L (ref 38–126)
Anion gap: 8 (ref 5–15)
BILIRUBIN TOTAL: 0.4 mg/dL (ref 0.3–1.2)
BUN: 25 mg/dL — ABNORMAL HIGH (ref 8–23)
CALCIUM: 9.1 mg/dL (ref 8.9–10.3)
CO2: 31 mmol/L (ref 22–32)
CREATININE: 0.72 mg/dL (ref 0.61–1.24)
Chloride: 101 mmol/L (ref 98–111)
GFR calc Af Amer: >60 mL/min (ref >60)
GFR calc non Af Amer: >60 mL/min (ref >60)
Glucose, Bld: 89 mg/dL (ref 70–99)
Potassium: 4 mmol/L (ref 3.5–5.1)
Sodium: 140 mmol/L (ref 135–145)
TOTAL PROTEIN: 6.4 g/dL — AB (ref 6.5–8.1)

## 2018-08-01 LAB — PHOSPHORUS: Phosphorus: 3.6 mg/dL (ref 2.5–4.6)

## 2018-08-01 LAB — CBC WITH DIFFERENTIAL/PLATELET
Abs Immature Granulocytes: 0.02 10*3/uL (ref 0.00–0.07)
Basophils Absolute: 0.1 10*3/uL (ref 0.0–0.1)
Basophils Relative: 1 %
EOS PCT: 4 %
Eosinophils Absolute: 0.3 10*3/uL (ref 0.0–0.5)
HCT: 37.1 % — ABNORMAL LOW (ref 39.0–52.0)
Hemoglobin: 12.1 g/dL — ABNORMAL LOW (ref 13.0–17.0)
Immature Granulocytes: 0 %
Lymphocytes Relative: 29 %
Lymphs Abs: 2.3 10*3/uL (ref 0.7–4.0)
MCH: 31.4 pg (ref 26.0–34.0)
MCHC: 32.6 g/dL (ref 30.0–36.0)
MCV: 96.4 fL (ref 80.0–100.0)
MONOS PCT: 12 %
Monocytes Absolute: 1 10*3/uL (ref 0.1–1.0)
Neutro Abs: 4.1 10*3/uL (ref 1.7–7.7)
Neutrophils Relative %: 54 %
Platelets: 208 10*3/uL (ref 150–400)
RBC: 3.85 MIL/uL — ABNORMAL LOW (ref 4.22–5.81)
RDW: 13.2 % (ref 11.5–15.5)
WBC: 7.7 10*3/uL (ref 4.0–10.5)
nRBC: 0 % (ref 0.0–0.2)

## 2018-08-01 LAB — MAGNESIUM: Magnesium: 2.2 mg/dL (ref 1.7–2.4)

## 2018-08-01 MED ORDER — ENOXAPARIN SODIUM 40 MG/0.4ML ~~LOC~~ SOLN
40.0000 mg | SUBCUTANEOUS | Status: DC
  Administered 2018-08-02: 40 mg via SUBCUTANEOUS
  Filled 2018-08-01: qty 0.4

## 2018-08-01 NOTE — Consult Note (Signed)
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 Data)   CHL IP FREQUENCY AND DURATION 07/23/2018 Speech Therapy Frequency (ACUTE ONLY) min 2x/week Treatment Duration 2 weeks      CHL IP ORAL PHASE 07/23/2018 Oral Phase WFL Oral - Pudding Teaspoon -- Oral - Pudding Cup -- Oral - Honey Teaspoon -- Oral - Honey Cup -- Oral - Nectar Teaspoon -- Oral - Nectar Cup -- Oral - Nectar Straw -- Oral - Thin Teaspoon -- Oral - Thin Cup -- Oral - Thin Straw -- Oral - Puree -- Oral - Mech Soft -- Oral - Regular -- Oral - Multi-Consistency -- Oral - Pill -- Oral Phase - Comment --  CHL IP PHARYNGEAL PHASE 07/23/2018 Pharyngeal Phase Impaired Pharyngeal- Pudding Teaspoon -- Pharyngeal -- Pharyngeal- Pudding Cup -- Pharyngeal -- Pharyngeal- Honey Teaspoon -- Pharyngeal -- Pharyngeal- Honey Cup -- Pharyngeal -- Pharyngeal- Nectar Teaspoon Reduced pharyngeal peristalsis;Reduced epiglottic inversion;Reduced anterior laryngeal mobility;Reduced laryngeal elevation;Reduced airway/laryngeal closure;Reduced tongue base retraction;Penetration/Aspiration before swallow;Penetration/Apiration after swallow;Pharyngeal residue - valleculae;Pharyngeal residue - pyriform Pharyngeal Material enters airway, passes BELOW cords without attempt by patient to eject out  (silent aspiration) Pharyngeal- Nectar Cup -- Pharyngeal -- Pharyngeal- Nectar Straw -- Pharyngeal -- Pharyngeal- Thin Teaspoon Reduced pharyngeal peristalsis;Reduced epiglottic inversion;Reduced anterior laryngeal mobility;Reduced laryngeal elevation;Reduced airway/laryngeal closure;Reduced tongue base retraction;Penetration/Aspiration before swallow;Penetration/Apiration after swallow;Pharyngeal residue - valleculae;Pharyngeal residue - pyriform Pharyngeal Material enters airway, passes BELOW cords without attempt by patient to eject out (silent aspiration) Pharyngeal- Thin Cup -- Pharyngeal -- Pharyngeal- Thin Straw -- Pharyngeal -- Pharyngeal- Puree -- Pharyngeal -- Pharyngeal- Mechanical Soft -- Pharyngeal -- Pharyngeal- Regular -- Pharyngeal -- Pharyngeal- Multi-consistency -- Pharyngeal -- Pharyngeal- Pill -- Pharyngeal -- Pharyngeal Comment --  CHL IP CERVICAL ESOPHAGEAL PHASE 07/23/2018 Cervical Esophageal Phase Impaired Pudding Teaspoon -- Pudding Cup -- Honey Teaspoon -- Honey Cup -- Nectar Teaspoon Reduced cricopharyngeal relaxation Nectar Cup -- Nectar Straw -- Thin Teaspoon Reduced cricopharyngeal relaxation Thin Cup -- Thin Straw -- Puree -- Mechanical Soft -- Regular -- Multi-consistency -- Pill -- Cervical Esophageal Comment -- Venita Sheffield Nix 07/23/2018, 11:17 AM  Pollyann Glen, M.A. CCC-SLP Acute Rehabilitation Services Pager [phone] Office [phone]              Labs:  CBC: Recent Labs    07/25/18 0251 07/27/18 0246 07/31/18 0256 08/01/18 0921  WBC 10.2 9.4 8.2 7.7  HGB 13.4 11.9* 12.5* 12.1*  HCT 40.2 37.3* 37.4* 37.1*  PLT 131* 169 205 208    COAGS: Recent Labs    07/21/18 1914 07/31/18 0256  INR 1.0 1.0  APTT  --  32    BMP: Recent Labs    07/21/18 1914 07/22/18 0223 07/25/18 0251 07/31/18 0256  NA 137 141 138 137  K 4.1 4.4 3.6 3.8  CL 103 100 102  98  CO2 27 30 29 30   GLUCOSE 116* 149* 108* 99  BUN 15 12 22 23   CALCIUM 9.4 9.5 8.9 9.1  CREATININE 0.90  0.87 0.78 0.74  GFRNONAA >60 >60 >60 >60  GFRAA >60 >60 >60 >60    LIVER FUNCTION TESTS: Recent Labs    06/14/18 1451 07/25/18 0251 07/31/18 0256  BILITOT 0.2 0.5 0.5  AST 20 14* 26  ALT 16 13 26   ALKPHOS 67 50 58  PROT 7.3 6.5 6.2*  ALBUMIN 4.2 3.0* 2.7*    TUMOR MARKERS: No results for input(s): AFPTM, CEA, CA199, CHROMGRNA in the last 8760 hours.  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  Patient agreeable to placement.  Discussed results of CT scan which shows that he may not be a candidate for percutaneous placement, however may be possible with visualization of the transverse colon.  Discussed with RN.  Care order placed for barium administration this afternoon.  Abd 1V ordered for tomorrow AM.  Will review with MD.  INR 1.0, lovenox held.   Risks and benefits discussed with the patient including, but not limited to the need for a barium enema during the procedure, bleeding, infection, peritonitis, or damage to adjacent structures.  All of the patient's questions were answered, patient is agreeable to proceed. Consent signed and in chart.  Thank you for this interesting consult.  I greatly enjoyed meeting Nam Vossler and look forward to participating in their care.  A copy of this report was sent to the requesting provider on this date.  Electronically Signed: Docia Barrier, PA 08/01/2018, 11:17 AM   I spent a total of 40 Minutes    in face to face in clinical consultation, greater than 50% of which was counseling/coordinating care for dysphagia.

## 2018-08-01 NOTE — Progress Notes (Signed)
Barium Sulfate administered. Tube feedings on hold. Will keep patient NPO.

## 2018-08-01 NOTE — Progress Notes (Addendum)
Occupational Therapy Treatment Patient Details Name: Ronald Ruiz MRN: [id number] DOB: [birth year] Today's Date: 08/01/2018    History of present illness 63yo w/ a hx of substance abuse on methadone, emphysema, chronic bronchitis, recently found lung nodules and a vocal cord lesion who presented with worsening dyspnea. He had a CT chest 2/11 for persisting cough and was found to have clustered fine nodular and irregular opacities in medial lower lobes, central middle lobe, and lingula. Path report pending. Trach 07/21/2018.    OT comments  Pt progressing toward established goals. Pt currently requires S-minguard for ADL and functional mobility. VSS on RA with PMSV throughout session. Pt will continue to benefit from skilled OT to increase their safety and independence with ADL and functional mobility for ADL to facilitate discharge home.      Follow Up Recommendations  Supervision/Assistance - 24 hour    Equipment Recommendations  None recommended by OT    Recommendations for Other Services      Precautions / Restrictions Precautions Precautions: Fall Precaution Comments: mildly unsteady on his feet, trach with PMV Restrictions Weight Bearing Restrictions: No       Mobility Bed Mobility Overal bed mobility: Modified Independent                Transfers Overall transfer level: Needs assistance Equipment used: None Transfers: Sit to/from Stand Sit to Stand: Supervision         General transfer comment: supervision for safety, reliant on hands for transitions.     Balance Overall balance assessment: Mild deficits observed, not formally tested Sitting-balance support: Feet supported;No upper extremity supported Sitting balance-Leahy Scale: Good     Standing balance support: No upper extremity supported Standing balance-Leahy Scale: Good Standing balance comment: static standing with S, pt required minguard for higher level dynamic activities                            ADL either performed or assessed with clinical judgement   ADL Overall ADL's : Needs assistance/impaired     Grooming: Standing;Supervision/safety                   Toilet Transfer: Supervision/safety;Ambulation Toilet Transfer Details (indicate cue type and reason): simulated toilet transfer with in room ambulation following sit<>stand from/return to EOB         Functional mobility during ADLs: Supervision/safety;Min guard General ADL Comments: pt able to complete ADL with S while standing no LOB noted, minguard for more dynamic balance activities;VSS throughout session     Vision       Perception     Praxis      Cognition Arousal/Alertness: Awake/alert Behavior During Therapy: WFL for tasks assessed/performed Overall Cognitive Status: Within Functional Limits for tasks assessed                                          Exercises     Shoulder Instructions       General Comments VSS on RA with use of PMSV;daughter present during session;educated pt on general BUE/BLE exercises, educated pt on importance of mobiltiy throughout day, pt will benefit from theraband     Pertinent Vitals/ Pain       Pain Assessment: No/denies pain  Home Living  Prior Functioning/Environment              Frequency  Min 2X/week        Progress Toward Goals  OT Goals(current goals can now be found in the care plan section)  Progress towards OT goals: Progressing toward goals  Acute Rehab OT Goals Patient Stated Goal: to go home OT Goal Formulation: With patient/family Time For Goal Achievement: 08/08/18 Potential to Achieve Goals: Good ADL Goals Pt Will Perform Lower Body Bathing: with modified independence;sit to/from stand Pt Will Perform Lower Body Dressing: with modified independence;sit to/from stand Pt Will Transfer to Toilet: with modified  independence;ambulating Pt Will Perform Tub/Shower Transfer: with supervision;with caregiver independent in assisting;3 in 1 Additional ADL Goal #1: Pt will independently verbalize 3 energy conservation strategies for ADL tasks  Plan Discharge plan remains appropriate    Co-evaluation                 AM-PAC OT "6 Clicks" Daily Activity     Outcome Measure   Help from another person eating meals?: Total(NPO) Help from another person taking care of personal grooming?: A Little Help from another person toileting, which includes using toliet, bedpan, or urinal?: A Little Help from another person bathing (including washing, rinsing, drying)?: A Little Help from another person to put on and taking off regular upper body clothing?: A Little Help from another person to put on and taking off regular lower body clothing?: A Little 6 Click Score: 16    End of Session    OT Visit Diagnosis: Unsteadiness on feet (R26.81);Muscle weakness (generalized) (M62.81)   Activity Tolerance Patient tolerated treatment well   Patient Left with call bell/phone within reach;with family/visitor present;in bed;with bed alarm set   Nurse Communication Mobility status        Time: 0017-4944 OT Time Calculation (min): 14 min  Charges: OT General Charges $OT Visit: 1 Visit OT Treatments $Self Care/Home Management : 8-22 mins  Dorinda Hill OTR/L [place] Office: [place] 08/01/2018, 4:04 PM

## 2018-08-01 NOTE — Progress Notes (Signed)
PROGRESS NOTE    Ronald Ruiz  TOI:[id number] DOB: 09/12/1955 DOA: 07/21/2018 PCP: [name], MD   Brief Narrative:  The patient Ronald Ruiz is a 63 y.o. year old male with medical history significant for substance abuse on methadone, emphysema, chronic bronchitis,COPD, and recently found lung nodules and a vocal cord lesion with > 40 lb weight loss in 6 months with outpatient workup including PET CT showing vocal cord mass with increased SUV uptake suggestive of malignancy. 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    **Interim History He is currently awaiting long term plan for nutrition with PEG tube and IR is planning on doing it tomorrow 3/12 after administration of barium overnight tonight.    Assessment & Plan:   Principal Problem:   Malignant neoplasm of glottis (HCC) Active Problems:   Protein-calorie malnutrition, severe   COPD with chronic bronchitis (HCC)   Dysphagia   Laryngeal mass   Substance abuse ([place])  Newly diagnosed Invasive squamous cell laryngeal carcinoma.  -Recent outpatient CT chest imaging concerning for pulmonary nodule that could be malignancy though PET scan showed no uptake making benign lesion more likely. No evidence of metastatic disease -repeat CT chest in 3 months per oncology -begin outpatient chemoradiation as outpatient -Will outpt f/u w/ med onc, rad onc, ENT, nutritional therapy on discharge  Dysphagia. -Severe aspiration risk, at risk for inadequate nutrition/hydration per MBS/Speech evaluation.  -CT abd imaging colon anterior to stomach, may not be a candidate for percutaneous g tube placement. 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 Doing well with passy-muir valve -No further surgical plans -C/w Nebs as below  Thrombocytopenia, resolved -Baseline 188, nadir of 144, currently 208. -Continue to Monitor for S/Sx of Bleeding -Repeat CBC in AM   COPD. Hx of emphysema and chronic bronchitis.  Tobacco Abuse -Currently has Clear breath sounds -Encourage smoking cessation and provided Nicotine 21 mg TD -Continued scheduled inhalers with Ipratropium 0.5 mg Neb BID, Arfomoterol 15 mcg Neb BID, Budesonide 0.25 mg Neb BID and Albuterolol 2.5 mg Neb q2hprn Wheezing   Hx of Substance Abuse -Continue current Methadone dose at 80 mg per tube Daily   Severe Malnutrition in the context of Chronic Illness -Nutritionist consulted and appreciate recommendations -Patient is getting TF through NG prior to PEG being placed -Vital AF 1.2 increased to a goal rate of 85 mL/hr  Normocytic Anemia -Patient's Hb/Hct went from 12.5/37.4 -> 12.1/371. -Check Anemia Panel in the AM -Continue to Monitor for S/Sx of Bleeding -Repeat CBC in AM   DVT prophylaxis: Enoxaparin 40 mg sq q24h Code Status: FULL CODE Family Communication: Discussed with family present at bedside Disposition Plan: Home with Supervision after PEG and tolerance of TF.    Consultants:   Interventional Radiology  ENT  Oncology  PCCM Transferr   Procedures:   Flexible Larygnoscopy 2/29  Tracheostomy 2/29   Antimicrobials:  Anti-infectives (From admission, onward)   None     Subjective: Seen and examined at bedside and he  was doing well today.  Awaiting PEG tube placement.  Denying chest pain, nausea, vomiting.  No other concerns or complaints at this time and still has core track with tube feedings going.   Objective: Vitals:    08/01/18 0828 08/01/18 0829 08/01/18 0830 08/01/18 1144  BP:      Pulse:   (!) 51 60  Resp:   14 16  Temp:      TempSrc:      SpO2: 100% 100% 100% 97%  Weight:      Height:        Intake/Output Summary (Last 24 hours) at 08/01/2018 1202 Last data filed at 08/01/2018 3419 Gross per 24 hour  Intake 1020 ml  Output 400 ml  Net 620 ml   Filed Weights   07/28/18 0450 07/29/18 0500 07/30/18 0500  Weight: 61.9 kg 62.9 kg 63.3 kg   Examination: Physical Exam:  Constitutional: Thin elderly Caucasian male in NAD and appears calm and comfortable Eyes: Lids and conjunctivae normal, sclerae anicteric  ENMT: External Ears, Nose appear normal. Grossly normal hearing. Has Cortrak in place Neck: Appears normal, supple, no cervical masses, normal ROM, no appreciable thyromegaly; Has a Tracheostomy with PMV Respiratory: Diminished to auscultation bilaterally, no wheezing, rales, rhonchi or crackles. Normal respiratory effort and patient is not tachypenic. No accessory muscle use.  Cardiovascular: RRR, no murmurs / rubs / gallops. S1 and S2 auscultated. No extremity edema.  Abdomen: Soft, non-tender, non-distended. Bowel sounds positive x4.  GU: Deferred. Musculoskeletal: No clubbing / cyanosis of digits/nails. No joint deformity upper and lower extremities. .  Skin: No rashes, lesions, ulcers on a limited skin evaluation. No induration; Warm and dry.  Neurologic: CN 2-12 grossly intact with no focal deficits.  Romberg sign and cerebellar reflexes not assessed.  Psychiatric: Normal judgment and insight. Alert and oriented x 3. Normal mood and appropriate affect.   Data Reviewed: I have personally reviewed following labs and imaging studies  CBC: Recent Labs  Lab 07/27/18 0246 07/31/18 0256 08/01/18 0921  WBC 9.4 8.2 7.7  NEUTROABS 5.3  --  4.1  HGB 11.9* 12.5* 12.1*  HCT 37.3* 37.4* 37.1*  MCV 95.4 94.9 96.4  PLT 169 205 379   Basic Metabolic Panel: Recent Labs  Lab 07/31/18 0256  08/01/18 0921  NA 137 140  K 3.8 4.0  CL 98 101  CO2 30 31  GLUCOSE 99 89  BUN 23 25*  CREATININE 0.74 0.72  CALCIUM 9.1 9.1  MG  --  2.2  PHOS  --  3.6   GFR: Estimated Creatinine Clearance: 85.7 mL/min (by C-G formula based on SCr of 0.72 mg/dL). Liver Function Tests: Recent Labs  Lab 07/31/18 0256 08/01/18 0921  AST 26 22  ALT 26 25  ALKPHOS 58 53  BILITOT 0.5 0.4  PROT 6.2* 6.4*  ALBUMIN 2.7* 2.8*   No results for input(s): LIPASE, AMYLASE in the last 168 hours. No results for input(s): AMMONIA in the last 168 hours. Coagulation Profile: Recent Labs  Lab 07/31/18 0256  INR 1.0   Cardiac Enzymes: No results for input(s): CKTOTAL, CKMB, CKMBINDEX, TROPONINI in the last 168 hours. BNP (last 3 results) No results for input(s): PROBNP in the last 8760 hours. HbA1C: No results for input(s): HGBA1C in the last 72 hours. CBG: Recent Labs  Lab 07/29/18 1751 07/29/18 1951 07/30/18 0010 07/30/18 0415 07/30/18 0740  GLUCAP 85 87 114* 116* 110*   Lipid Profile: No results for input(s): CHOL, HDL, LDLCALC, TRIG, CHOLHDL,  LDLDIRECT in the last 72 hours. Thyroid Function Tests: No results for input(s): TSH, T4TOTAL, FREET4, T3FREE, THYROIDAB in the last 72 hours. Anemia Panel: No results for input(s): VITAMINB12, FOLATE, FERRITIN, TIBC, IRON, RETICCTPCT in the last 72 hours. Sepsis Labs: No results for input(s): PROCALCITON, LATICACIDVEN in the last 168 hours.  No results found for this or any previous visit (from the past 240 hour(s)).   RN Pressure Injury Documentation:     Estimated body mass index is 18.93 kg/m as calculated from the following:   Height as of this encounter: 6' (1.829 m).   Weight as of this encounter: 63.3 kg.  Malnutrition Type:  Nutrition Problem: Severe Malnutrition Etiology: chronic illness(COPD with new vocal cord mass (suspected malignancy))   Malnutrition Characteristics:  Signs/Symptoms: severe fat depletion, severe  muscle depletion, percent weight loss(7% weight loss in one month) Percent weight loss: 7 %   Nutrition Interventions:  Interventions: Tube feeding    Radiology Studies: No results found.  Scheduled Meds: . arformoterol  15 mcg Nebulization BID  . budesonide (PULMICORT) nebulizer solution  0.25 mg Nebulization BID  . chlorhexidine  15 mL Mouth Rinse BID  . [START ON 08/02/2018] enoxaparin (LOVENOX) injection  40 mg Subcutaneous Q24H  . ipratropium  0.5 mg Nebulization BID  . mouth rinse  15 mL Mouth Rinse q12n4p  . methadone  80 mg Per Tube Daily  . nicotine  21 mg Transdermal Daily   Continuous Infusions: . feeding supplement (VITAL AF 1.2 CAL) 1,000 mL (08/01/18 0120)    LOS: 11 days   Kerney Elbe, DO [facility] PAGER is on AMION  If 7PM-7AM, please contact night-coverage [email or web address] Password [facility] 08/01/2018, 12:02 PM

## 2018-08-02 ENCOUNTER — Inpatient Hospital Stay (HOSPITAL_COMMUNITY): Payer: BLUE CROSS/BLUE SHIELD

## 2018-08-02 ENCOUNTER — Encounter (HOSPITAL_COMMUNITY): Payer: Self-pay | Admitting: Interventional Radiology

## 2018-08-02 HISTORY — PX: IR GASTROSTOMY TUBE MOD SED: IMG625

## 2018-08-02 LAB — COMPREHENSIVE METABOLIC PANEL
ALT: 26 U/L (ref 0–44)
AST: 21 U/L (ref 15–41)
Albumin: 2.9 g/dL — ABNORMAL LOW (ref 3.5–5.0)
Alkaline Phosphatase: 53 U/L (ref 38–126)
Anion gap: 8 (ref 5–15)
BUN: 24 mg/dL — ABNORMAL HIGH (ref 8–23)
CO2: 31 mmol/L (ref 22–32)
Calcium: 9.2 mg/dL (ref 8.9–10.3)
Chloride: 100 mmol/L (ref 98–111)
Creatinine, Ser: 0.79 mg/dL (ref 0.61–1.24)
GFR calc non Af Amer: >60 mL/min (ref >60)
Glucose, Bld: 84 mg/dL (ref 70–99)
Potassium: 4.1 mmol/L (ref 3.5–5.1)
Sodium: 139 mmol/L (ref 135–145)
Total Bilirubin: 0.6 mg/dL (ref 0.3–1.2)
Total Protein: 6.6 g/dL (ref 6.5–8.1)

## 2018-08-02 LAB — FOLATE: Folate: 33.8 ng/mL (ref >5.9)

## 2018-08-02 LAB — CBC WITH DIFFERENTIAL/PLATELET
Abs Immature Granulocytes: 0.02 10*3/uL (ref 0.00–0.07)
Basophils Absolute: 0.1 10*3/uL (ref 0.0–0.1)
Basophils Relative: 1 %
EOS PCT: 4 %
Eosinophils Absolute: 0.3 10*3/uL (ref 0.0–0.5)
HCT: 36.1 % — ABNORMAL LOW (ref 39.0–52.0)
Hemoglobin: 11.9 g/dL — ABNORMAL LOW (ref 13.0–17.0)
Immature Granulocytes: 0 %
Lymphocytes Relative: 27 %
Lymphs Abs: 2.2 10*3/uL (ref 0.7–4.0)
MCH: 31.8 pg (ref 26.0–34.0)
MCHC: 33 g/dL (ref 30.0–36.0)
MCV: 96.5 fL (ref 80.0–100.0)
Monocytes Absolute: 0.9 10*3/uL (ref 0.1–1.0)
Monocytes Relative: 10 %
Neutro Abs: 4.8 10*3/uL (ref 1.7–7.7)
Neutrophils Relative %: 58 %
Platelets: 224 10*3/uL (ref 150–400)
RBC: 3.74 MIL/uL — ABNORMAL LOW (ref 4.22–5.81)
RDW: 13.1 % (ref 11.5–15.5)
WBC: 8.3 10*3/uL (ref 4.0–10.5)
nRBC: 0 % (ref 0.0–0.2)

## 2018-08-02 LAB — RETICULOCYTES
Immature Retic Fract: 12.2 % (ref 2.3–15.9)
RBC.: 3.74 MIL/uL — ABNORMAL LOW (ref 4.22–5.81)
RETIC COUNT ABSOLUTE: 37.4 10*3/uL (ref 19.0–186.0)
Retic Ct Pct: 1 % (ref 0.4–3.1)

## 2018-08-02 LAB — PHOSPHORUS: Phosphorus: 3.7 mg/dL (ref 2.5–4.6)

## 2018-08-02 LAB — IRON AND TIBC
IRON: 68 ug/dL (ref 45–182)
Saturation Ratios: 29 % (ref 17.9–39.5)
TIBC: 238 ug/dL — ABNORMAL LOW (ref 250–450)
UIBC: 170 ug/dL

## 2018-08-02 LAB — FERRITIN: Ferritin: 400 ng/mL — ABNORMAL HIGH (ref 24–336)

## 2018-08-02 LAB — MAGNESIUM: Magnesium: 2.1 mg/dL (ref 1.7–2.4)

## 2018-08-02 LAB — VITAMIN B12: Vitamin B-12: 2025 pg/mL — ABNORMAL HIGH (ref 180–914)

## 2018-08-02 MED ORDER — HYDROMORPHONE HCL 1 MG/ML IJ SOLN
1.0000 mg | INTRAMUSCULAR | Status: AC | PRN
  Administered 2018-08-02 – 2018-08-03 (×4): 1 mg via INTRAVENOUS
  Filled 2018-08-02 (×4): qty 1

## 2018-08-02 MED ORDER — NICOTINE 21 MG/24HR TD PT24
21.0000 mg | MEDICATED_PATCH | Freq: Once | TRANSDERMAL | Status: DC
  Administered 2018-08-02: 21 mg via TRANSDERMAL
  Filled 2018-08-02: qty 1

## 2018-08-02 MED ORDER — LIDOCAINE HCL (PF) 1 % IJ SOLN
INTRAMUSCULAR | Status: AC | PRN
  Administered 2018-08-02 (×2): 5 mL

## 2018-08-02 MED ORDER — CEFAZOLIN SODIUM-DEXTROSE 2-4 GM/100ML-% IV SOLN
INTRAVENOUS | Status: AC
  Filled 2018-08-02: qty 100

## 2018-08-02 MED ORDER — GLUCAGON HCL RDNA (DIAGNOSTIC) 1 MG IJ SOLR
INTRAMUSCULAR | Status: AC
  Filled 2018-08-02: qty 1

## 2018-08-02 MED ORDER — MIDAZOLAM HCL 2 MG/2ML IJ SOLN
INTRAMUSCULAR | Status: AC | PRN
  Administered 2018-08-02: 1 mg via INTRAVENOUS

## 2018-08-02 MED ORDER — IOPAMIDOL (ISOVUE-300) INJECTION 61%
INTRAVENOUS | Status: AC
  Administered 2018-08-02: 25 mL
  Filled 2018-08-02: qty 100

## 2018-08-02 MED ORDER — FENTANYL CITRATE (PF) 100 MCG/2ML IJ SOLN
INTRAMUSCULAR | Status: AC | PRN
  Administered 2018-08-02: 50 ug via INTRAVENOUS

## 2018-08-02 MED ORDER — FENTANYL CITRATE (PF) 100 MCG/2ML IJ SOLN
INTRAMUSCULAR | Status: AC
  Filled 2018-08-02: qty 2

## 2018-08-02 MED ORDER — LIDOCAINE HCL 1 % IJ SOLN
INTRAMUSCULAR | Status: AC
  Filled 2018-08-02: qty 20

## 2018-08-02 MED ORDER — MIDAZOLAM HCL 2 MG/2ML IJ SOLN
INTRAMUSCULAR | Status: AC
  Filled 2018-08-02: qty 2

## 2018-08-02 NOTE — Progress Notes (Signed)
RT NOTE: RT placed patient on 28% ATC through medical air to see if patient tolerates well and does not require oxygen for home use. Patient is on monitor and is sating 97-98%. Vitals are stable. RT will continue to monitor.

## 2018-08-02 NOTE — TOC Progression Note (Signed)
Transition of Care [facility]) - Progression Note    Patient Details  Name: Ronald Ruiz MRN: [id number] Date of Birth: 01-Feb-1956  [facility]) CM/SW Contact  Carles Collet, RN Phone Number: 08/02/2018, 11:43 AM  Clinical Narrative:      Ronald Ruiz has orders for trach supplies,  NEEDS Epic order for tube feed and pump with dietician recs for feeds copied into it, and Ronald Ruiz notified when order in.  MAY NEED Oxygen- trying TC on RA w humidity 3/12.  Will DC to home w daughter. Who he states had training for trach care yesterday and other daughter is a nurse per patient.  NEEDS HH RN ST PT order for [facility].    Expected Discharge Plan: [place]    Expected Discharge Plan and Services Expected Discharge Plan: [place]   Post Acute Care Choice: Home Health                       HH Arranged: PT, Speech Therapy, RN [facility] Agency: [facility])   Social Determinants of Health (SDOH) Interventions    Readmission Risk Interventions 30 Day Unplanned Readmission Risk Score     ED to Hosp-Admission (Current) from 07/21/2018 in [place] [facility] [place] [facility]  30 Day Unplanned Readmission Risk Score (%)  20 Filed at 08/02/2018 0800     This score is the patient's risk of an unplanned readmission within 30 days of being discharged (0 -100%). The score is based on dignosis, age, lab data, medications, orders, and past utilization.   Low:  0-14.9   Medium: 15-21.9   High: 22-29.9   Extreme: 30 and above       No flowsheet data found.

## 2018-08-02 NOTE — Progress Notes (Addendum)
  Speech Language Pathology Treatment: Dysphagia  Patient Details Name: Ronald Ruiz MRN: [id number] DOB: 1955/06/18 Today's Date: 08/02/2018 Time: 1152-1207 SLP Time Calculation (min) (ACUTE ONLY): 15 min  Assessment / Plan / Recommendation Clinical Impression  Introduced home exercise program for maintenance of swallow function during treatment.  Pt demonstrated ability to complete all exercises as noted below.    Provided education on importance of oral care during NPO status.  Pt verbalized understanding.  Pt reports wife had similar experience during which she used Biotene, which I recommended for pt as needed as well.  Pt continues to tolerate PMV well and is using independently.  [place] No of reps: 1 Effort: Good Accuracy: Good Comment: Difficult for pt to execute, but able after several attempts  [place] No of reps: 2 Effort: Good Accuracy: Good Comment: initially unable to trigger full swallow.  Appeared to achieve adequate laryngeal elevation on palpation  Shaker Head lift No of reps: 3 Effort: Good Accuracy: Good  Shaker Head hold No of reps: 1, 10-15 seconds Effort: Good Accuracy: Good  CTAR No of reps: 3 Effort: Good Accuracy: Good   HPI HPI: Pt is a 63 year old man recently found to have lung nodules and L vocal cord lesion presenting with worsening dyspnea, s/p trach 2/29. 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, emphysema, chronic bronchitis.  PEG placement still pending.      SLP Plan  Continue with current plan of care       Recommendations  Diet recommendations: NPO Medication Administration: Via alternative means                SLP Visit Diagnosis: Dysphagia, oropharyngeal phase (R13.12) Plan: Continue with current plan of care        [place], [place], [place] Office: [phone]; Pager (3/12): [phone] 08/02/2018, 12:35 PM

## 2018-08-02 NOTE — Procedures (Signed)
Interventional Radiology Procedure Note  Procedure: Placement of percutaneous 20F pull-through gastrostomy tube. Complications: None Recommendations: - NPO except for sips and chips remainder of today and overnight - Maintain G-tube to LWS until tomorrow morning  - May advance diet as tolerated and begin using tube tomorrow morning  Signed,   Danton Palmateer S. Sylvia Kondracki, DO   

## 2018-08-02 NOTE — Progress Notes (Signed)
PROGRESS NOTE    Ronald Ruiz  VEL:[id number] DOB: July 07, 1955 DOA: 07/21/2018 PCP: [name], MD   Brief Narrative:  The patient Ronald Ruiz is a 63 y.o. year old male with medical history significant for substance abuse on methadone, emphysema, chronic bronchitis,COPD, and recently found lung nodules and a vocal cord lesion with > 40 lb weight loss in 6 months with outpatient workup including PET CT showing vocal cord mass with increased SUV uptake suggestive of malignancy. 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    **Interim History He is currently awaiting long term plan for nutrition with PEG tube and IR is planning on doing it tomorrow 3/12 after administration of barium yesterday. There was some confusion of whether the PEG was to be placed due to his anatomy and the patient was not scheduled and an DG Abdomen was instead put in. I discussed with Dr. Earleen Newport and patient is still to get PEG placed sometime today and he will look into the issue.   Assessment & Plan:   Principal Problem:   Malignant neoplasm of glottis (HCC) Active Problems:   Protein-calorie malnutrition, severe   COPD with chronic bronchitis (HCC)   Dysphagia   Laryngeal mass   Substance abuse ([place])  Newly diagnosed Invasive squamous cell laryngeal carcinoma.  -Recent outpatient CT chest imaging concerning for pulmonary nodule that could be malignancy though PET scan showed no uptake making benign lesion more likely. No evidence of metastatic disease -repeat CT chest in 3 months per oncology -begin outpatient chemoradiation as outpatient -Will outpt f/u w/ med onc, rad onc, ENT, nutritional therapy on  discharge  Dysphagia. -Severe aspiration risk, at risk for inadequate nutrition/hydration per MBS/Speech evaluation.  -CT abd imaging colon anterior to stomach, may not be a candidate for percutaneous g tube placement. 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 Doing well with passy-muir valve -No further surgical plans -C/w Nebs as below  Thrombocytopenia, resolved -Baseline 188, nadir of 144, currently 224 -Continue to Monitor for S/Sx of Bleeding -Repeat CBC in AM   COPD. Hx of Emphysema and Chronic Bronchitis.  Tobacco Abuse -Currently has Clear breath sounds -Encourage smoking cessation and provided Nicotine 21 mg TD -Continued scheduled inhalers with Ipratropium 0.5 mg Neb BID, Arfomoterol 15 mcg Neb BID, Budesonide 0.25 mg Neb BID and Albuterolol 2.5 mg Neb q2hprn Wheezing   Hx of Substance Abuse -Continue current Methadone dose at 80 mg per Tube Daily   Severe Malnutrition in the context of Chronic Illness -Nutritionist consulted and appreciate recommendations -Patient is getting TF through NG prior to PEG being placed -Vital AF 1.2 increased to a goal rate of 85 mL/hr once PEG is in place   Normocytic Anemia -Patient's Hb/Hct went from 12.5/37.4 -> 12.1/371 -> 11.9/36.1 -Checked Anemia Panel this AM and showed iron level of 68, U IBC of 170, TIBC of 238, saturation ratio of 29%, ferritin level 400,  low of 33.8, and vitamin B12 of 2025 -Continue to Monitor for S/Sx of Bleeding -Repeat CBC in AM   DVT prophylaxis: Enoxaparin 40 mg sq q24h Code Status: FULL CODE Family Communication: Discussed with family present at bedside Disposition Plan: Home with Supervision after PEG and  tolerance of TF.   Consultants:   Interventional Radiology  ENT  Oncology  PCCM Transferr   Procedures:   Flexible Larygnoscopy 2/29  Tracheostomy 2/29   Antimicrobials:  Anti-infectives (From admission, onward)   None     Subjective: Seen and examined at bedside and he was frustrated and awaiting PEG placement.  States shortness of breath is improved.  No nausea or vomiting.  No other concerns or complaints at this time.  Objective: Vitals:   08/02/18 0756 08/02/18 0833 08/02/18 1138 08/02/18 1150  BP: (!) 104/56     Pulse: (!) 54 (!) 56 (!) 52 (!) 58  Resp: (!) 26 18 18 18   Temp: (!) 97.4 F (36.3 C)     TempSrc: Oral     SpO2: 98% 97% 97% 98%  Weight:      Height:        Intake/Output Summary (Last 24 hours) at 08/02/2018 1338 Last data filed at 08/01/2018 2324 Gross per 24 hour  Intake -  Output 200 ml  Net -200 ml   Filed Weights   07/28/18 0450 07/29/18 0500 07/30/18 0500  Weight: 61.9 kg 62.9 kg 63.3 kg   Examination: Physical Exam:  Constitutional: Elderly Caucasian male currently no acute distress appears slightly frustrated Eyes: Lids and conjunctive are normal.  Sclera anicteric ENMT: Sternal ears and nose appear normal.  Grossly normal hearing.  Has a core track in place which is clamped Neck: Appears supple with no appreciable JVD.  Has a tracheostomy with PMV in place Respiratory: Diminished to auscultation bilaterally with no appreciable wheezing, rales, rhonchi.  Patient is tachypneic wheezing any accessory muscles to breathe Cardiovascular: The rate and rhythm.  No appreciable murmurs, rubs, gallops.  No lower extremity edema noted Abdomen: Soft, nontender, no nausea.  Bowel sounds present GU: Deferred Musculoskeletal: No clubbing or cyanosis noted.  No joint deformities upper lower extremities Skin: No rashes or lesions limited to skin evaluation.  Neurologic: CN II through XII grossly intact no appreciable focal deficits Psychiatric:  Intact judgment and insight.  Patient is awake, alert and oriented x3.  Slightly frustrated and anxious  Data Reviewed: I have personally reviewed following labs and imaging studies  CBC: Recent Labs  Lab 07/27/18 0246 07/31/18 0256 08/01/18 0921 08/02/18 0241  WBC 9.4 8.2 7.7 8.3  NEUTROABS 5.3  --  4.1 4.8  HGB 11.9* 12.5* 12.1* 11.9*  HCT 37.3* 37.4* 37.1* 36.1*  MCV 95.4 94.9 96.4 96.5  PLT 169 205 208 631   Basic Metabolic Panel: Recent Labs  Lab 07/31/18 0256 08/01/18 0921 08/02/18 0241  NA 137 140 139  K 3.8 4.0 4.1  CL 98 101 100  CO2 30 31 31   GLUCOSE 99 89 84  BUN 23 25* 24*  CREATININE 0.74 0.72 0.79  CALCIUM 9.1 9.1 9.2  MG  --  2.2 2.1  PHOS  --  3.6 3.7   GFR: Estimated Creatinine Clearance: 85.7 mL/min (by C-G formula based on SCr of 0.79 mg/dL). Liver Function Tests: Recent Labs  Lab 07/31/18 0256 08/01/18 0921 08/02/18 0241  AST 26 22 21   ALT 26 25 26   ALKPHOS 58 53 53  BILITOT 0.5 0.4 0.6  PROT 6.2*  6.4* 6.6  ALBUMIN 2.7* 2.8* 2.9*   No results for input(s): LIPASE, AMYLASE in the last 168 hours. No results for input(s): AMMONIA in the last 168 hours. Coagulation Profile: Recent Labs  Lab 07/31/18 0256  INR 1.0   Cardiac Enzymes: No results for input(s): CKTOTAL, CKMB, CKMBINDEX, TROPONINI in the last 168 hours. BNP (last 3 results) No results for input(s): PROBNP in the last 8760 hours. HbA1C: No results for input(s): HGBA1C in the last 72 hours. CBG: Recent Labs  Lab 07/29/18 1751 07/29/18 1951 07/30/18 0010 07/30/18 0415 07/30/18 0740  GLUCAP 85 87 114* 116* 110*   Lipid Profile: No results for input(s): CHOL, HDL, LDLCALC, TRIG, CHOLHDL, LDLDIRECT in the last 72 hours. Thyroid Function Tests: No results for input(s): TSH, T4TOTAL, FREET4, T3FREE, THYROIDAB in the last 72 hours. Anemia Panel: Recent Labs    08/02/18 0241  VITAMINB12 2,025*  FOLATE 33.8  FERRITIN 400*  TIBC 238*  IRON 68  RETICCTPCT 1.0    Sepsis Labs: No results for input(s): PROCALCITON, LATICACIDVEN in the last 168 hours.  No results found for this or any previous visit (from the past 240 hour(s)).   RN Pressure Injury Documentation:     Estimated body mass index is 18.93 kg/m as calculated from the following:   Height as of this encounter: 6' (1.829 m).   Weight as of this encounter: 63.3 kg.  Malnutrition Type:  Nutrition Problem: Severe Malnutrition Etiology: chronic illness(COPD with new vocal cord mass (suspected malignancy))   Malnutrition Characteristics:  Signs/Symptoms: severe fat depletion, severe muscle depletion, percent weight loss(7% weight loss in one month) Percent weight loss: 7 %   Nutrition Interventions:  Interventions: Tube feeding    Radiology Studies: No results found.  Scheduled Meds: . arformoterol  15 mcg Nebulization BID  . budesonide (PULMICORT) nebulizer solution  0.25 mg Nebulization BID  . chlorhexidine  15 mL Mouth Rinse BID  . enoxaparin (LOVENOX) injection  40 mg Subcutaneous Q24H  . ipratropium  0.5 mg Nebulization BID  . mouth rinse  15 mL Mouth Rinse q12n4p  . methadone  80 mg Per Tube Daily  . nicotine  21 mg Transdermal Daily   Continuous Infusions: . feeding supplement (VITAL AF 1.2 CAL) 1,000 mL (08/01/18 0120)    LOS: 12 days   Kerney Elbe, DO [facility] PAGER is on AMION  If 7PM-7AM, please contact night-coverage [email or web address] Password [facility] 08/02/2018, 1:38 PM

## 2018-08-03 LAB — COMPREHENSIVE METABOLIC PANEL
ALT: 28 U/L (ref 0–44)
AST: 25 U/L (ref 15–41)
Albumin: 3.1 g/dL — ABNORMAL LOW (ref 3.5–5.0)
Alkaline Phosphatase: 65 U/L (ref 38–126)
Anion gap: 13 (ref 5–15)
BUN: 28 mg/dL — ABNORMAL HIGH (ref 8–23)
CO2: 26 mmol/L (ref 22–32)
Calcium: 9.4 mg/dL (ref 8.9–10.3)
Chloride: 100 mmol/L (ref 98–111)
Creatinine, Ser: 1.03 mg/dL (ref 0.61–1.24)
GFR calc Af Amer: >60 mL/min (ref >60)
GFR calc non Af Amer: >60 mL/min (ref >60)
Glucose, Bld: 68 mg/dL — ABNORMAL LOW (ref 70–99)
Potassium: 4.5 mmol/L (ref 3.5–5.1)
Sodium: 139 mmol/L (ref 135–145)
Total Bilirubin: 1.1 mg/dL (ref 0.3–1.2)
Total Protein: 7 g/dL (ref 6.5–8.1)

## 2018-08-03 LAB — MAGNESIUM: Magnesium: 2.2 mg/dL (ref 1.7–2.4)

## 2018-08-03 LAB — CBC WITH DIFFERENTIAL/PLATELET
Abs Immature Granulocytes: 0.04 10*3/uL (ref 0.00–0.07)
BASOS ABS: 0.1 10*3/uL (ref 0.0–0.1)
Basophils Relative: 1 %
Eosinophils Absolute: 0.1 10*3/uL (ref 0.0–0.5)
Eosinophils Relative: 1 %
HCT: 39.6 % (ref 39.0–52.0)
Hemoglobin: 12.6 g/dL — ABNORMAL LOW (ref 13.0–17.0)
Immature Granulocytes: 0 %
Lymphocytes Relative: 20 %
Lymphs Abs: 2.3 10*3/uL (ref 0.7–4.0)
MCH: 30.7 pg (ref 26.0–34.0)
MCHC: 31.8 g/dL (ref 30.0–36.0)
MCV: 96.4 fL (ref 80.0–100.0)
Monocytes Absolute: 0.8 10*3/uL (ref 0.1–1.0)
Monocytes Relative: 7 %
NEUTROS ABS: 8.3 10*3/uL — AB (ref 1.7–7.7)
NEUTROS PCT: 71 %
Platelets: 237 10*3/uL (ref 150–400)
RBC: 4.11 MIL/uL — ABNORMAL LOW (ref 4.22–5.81)
RDW: 12.7 % (ref 11.5–15.5)
WBC: 11.7 10*3/uL — ABNORMAL HIGH (ref 4.0–10.5)
nRBC: 0 % (ref 0.0–0.2)

## 2018-08-03 LAB — PHOSPHORUS: PHOSPHORUS: 4.3 mg/dL (ref 2.5–4.6)

## 2018-08-03 MED ORDER — FREE WATER
300.0000 mL | Freq: Three times a day (TID) | Status: DC
  Administered 2018-08-03: 300 mL

## 2018-08-03 MED ORDER — OSMOLITE 1.5 CAL PO LIQD
350.0000 mL | Freq: Four times a day (QID) | ORAL | Status: DC
  Administered 2018-08-03: 350 mL
  Filled 2018-08-03 (×4): qty 474

## 2018-08-03 MED ORDER — FREE WATER: 300.0000 mL | Freq: Three times a day (TID) | 0 refills | Status: DC

## 2018-08-03 MED ORDER — OSMOLITE 1.5 CAL PO LIQD: 350.0000 mL | Freq: Four times a day (QID) | ORAL | 0 refills | Status: DC

## 2018-08-03 MED ORDER — NICOTINE 21 MG/24HR TD PT24: 21.0000 mg | MEDICATED_PATCH | Freq: Every day | TRANSDERMAL | 0 refills | Status: DC

## 2018-08-03 MED ORDER — PROCHLORPERAZINE MALEATE 5 MG PO TABS: 5.0000 mg | ORAL_TABLET | Freq: Four times a day (QID) | ORAL | 0 refills | Status: DC | PRN

## 2018-08-03 MED ORDER — IBUPROFEN 100 MG/5ML PO SUSP: 400.0000 mg | Freq: Three times a day (TID) | ORAL | 0 refills | Status: DC | PRN

## 2018-08-03 NOTE — Progress Notes (Signed)
PT Cancellation Note  Patient Details Name: Ronald Ruiz MRN: [id number] DOB: 1955/06/25   Cancelled Treatment:    Reason Eval/Treat Not Completed: Fatigue/lethargy limiting ability to participate. Pt underwent PEG placement yesterday, 3/12. Declining participation in PT this AM due to fatigue, weakness and discomfort. He is still NPO and reports not eating since Wednesday (3/11). He does report walking with nursing and family as able. PT to re-attempt as time allows.   Lorriane Shire 08/03/2018, 9:11 AM   Lorrin Goodell, PT  Office # [phone] Pager [phone]

## 2018-08-03 NOTE — Progress Notes (Signed)
Patient has been discharged from unit. Patient provided with discharge instructions and reviewed them with nurse. This RN also reviewed trach care and PEG tube care with patient. He reports he has no further questions.

## 2018-08-03 NOTE — Progress Notes (Signed)
Occupational Therapy Treatment Patient Details Name: Ronald Ruiz MRN: [id number] DOB: 21-Jul-1955 Today's Date: 08/03/2018    History of present illness 63yo w/ a hx of substance abuse on methadone, emphysema, chronic bronchitis, recently found lung nodules and a vocal cord lesion who presented with worsening dyspnea. He had a CT chest 2/11 for persisting cough and was found to have clustered fine nodular and irregular opacities in medial lower lobes, central middle lobe, and lingula. Path report pending. Trach 07/21/2018.    OT comments  Pt politely declined mobility this session secondary to fatigue stating "im tired and haven't eaten since Wednesday..I don't want to fall". Session focused on general UE exercise program this session with provided theraband. Pt demonstrated understanding of appropriate strengthening exercises and stretches. Will continue to follow acutely.    Follow Up Recommendations  Supervision/Assistance - 24 hour    Equipment Recommendations  None recommended by OT    Recommendations for Other Services      Precautions / Restrictions Precautions Precautions: Fall Restrictions Weight Bearing Restrictions: No       Mobility Bed Mobility                  Transfers                      Balance                                           ADL either performed or assessed with clinical judgement   ADL                                               Vision       Perception     Praxis      Cognition Arousal/Alertness: Awake/alert Behavior During Therapy: WFL for tasks assessed/performed Overall Cognitive Status: Within Functional Limits for tasks assessed                                 General Comments: Pt able to return demonstrate general UE exercises         Exercises Exercises: General Upper Extremity General Exercises - Upper Extremity Shoulder Horizontal ABduction:  Strengthening;Both;10 reps;Theraband Theraband Level (Shoulder Horizontal Abduction): Level 1 (Yellow);Level 2 (Red) Shoulder Horizontal ADduction: AAROM;Both Elbow Flexion: Strengthening;Both;10 reps;Theraband Theraband Level (Elbow Flexion): Level 1 (Yellow);Level 2 (Red) Elbow Extension: Strengthening;Both;10 reps;Theraband Theraband Level (Elbow Extension): Level 1 (Yellow);Level 2 (Red)   Shoulder Instructions       General Comments      Pertinent Vitals/ Pain          Home Living                                          Prior Functioning/Environment              Frequency  Min 2X/week        Progress Toward Goals  OT Goals(current goals can now be found in the care plan section)  Progress towards OT goals: Progressing toward goals  Acute Rehab OT Goals Patient Stated Goal:  to go home OT Goal Formulation: With patient/family Time For Goal Achievement: 08/08/18 Potential to Achieve Goals: Good ADL Goals Pt Will Perform Lower Body Bathing: with modified independence;sit to/from stand Pt Will Perform Lower Body Dressing: with modified independence;sit to/from stand Pt Will Transfer to Toilet: with modified independence;ambulating Pt Will Perform Tub/Shower Transfer: with supervision;with caregiver independent in assisting;3 in 1 Additional ADL Goal #1: Pt will independently verbalize 3 energy conservation strategies for ADL tasks  Plan Discharge plan remains appropriate    Co-evaluation                 AM-PAC OT "6 Clicks" Daily Activity     Outcome Measure   Help from another person eating meals?: Total Help from another person taking care of personal grooming?: A Little Help from another person toileting, which includes using toliet, bedpan, or urinal?: A Little Help from another person bathing (including washing, rinsing, drying)?: A Little Help from another person to put on and taking off regular upper body clothing?: A  Little Help from another person to put on and taking off regular lower body clothing?: A Little 6 Click Score: 16    End of Session    OT Visit Diagnosis: Unsteadiness on feet (R26.81);Muscle weakness (generalized) (M62.81)   Activity Tolerance Patient limited by fatigue   Patient Left with call bell/phone within reach;with family/visitor present;in bed;with bed alarm set   Nurse Communication Mobility status        Time: 1749-4496 OT Time Calculation (min): 9 min  Charges: OT General Charges $OT Visit: 1 Visit OT Treatments $Therapeutic Exercise: 8-22 mins  Dorinda Hill OTR/L Acute Rehabilitation Services Office: [place] 08/03/2018, 11:02 AM

## 2018-08-03 NOTE — Progress Notes (Addendum)
Nutrition Follow Up  DOCUMENTATION CODES:   Severe malnutrition in context of chronic illness  INTERVENTION:    Osmolite 1.5 formula: 350 ml 4 times daily via G-tube  Provides 2100 kcals, 88 gm protein, 1067 ml free water  Free water flushes at 300 ml every 8 hours   D/C Vital AF 1.2  NUTRITION DIAGNOSIS:   Severe Malnutrition related to chronic illness(COPD with new vocal cord mass (suspected malignancy)) as evidenced by severe fat depletion, severe muscle depletion, percent weight loss(7% weight loss in one month), ongoing  GOAL:   Patient will meet greater than or equal to 90% of their needs, met  MONITOR:   TF tolerance, Skin, Labs, Weight trends, I & O's  ASSESSMENT:   62 yo male with 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    2/29 s/p trach, laryngoscopy and biopsy per ENT 3/02 s/p MBSS >> moderate-severe dysphagia 3/02 Cortrak placed >> tip at stomach 3/03 transferred out of [facility] 3/06 repeat MBSS >> SLP rec NPO  Pt remains on trach collar. He is s/p G-tube placement per IR last evening. G-tube to LIS until this AM; okay to start TF as well.  Pt is active during the day; will initiate bolus regimen. SLP following; tolerating PMV well. Labs & medications reviewed.  Diet Order:   Diet Order            Diet NPO time specified Except for: Ice Chips  Diet effective midnight             EDUCATION NEEDS:   No education needs have been identified at this time  Skin:  Skin Assessment: Reviewed RN Assessment  Last BM:  3/8  Height:   Ht Readings from Last 1 Encounters:  07/21/18 6' (1.829 m)   Weight:   Wt Readings from Last 1 Encounters:  07/30/18 63.3 kg   Ideal Body Weight:  80.9 kg  BMI:  Body mass index is 18.93 kg/m.  Estimated Nutritional Needs:   Kcal:  2000-2200  Protein:   90-105 gm  Fluid:  2.0-2.2 L  Katie Lamberton, RD, LDN Pager #: [phone] After-Hours Pager #: [phone]   

## 2018-08-03 NOTE — TOC Transition Note (Addendum)
Transition of Care [facility]) - CM/SW Discharge Note   Patient Details  Name: Ronald Ruiz MRN: [id number] Date of Birth: [birth year]  [facility] CM/SW Contact:  Maryclare Labrador, RN Phone Number: 08/03/2018, 3:20 PM   Clinical Narrative:   Pt discharging home.  Both [facility] and [facility] aware that pt discharging today. [employer] ensures that pt will receive equipment prior to next bolus administration time at approximately 6 pm this evening.  Pt remains on room air.  Pts daughter will provide needed support at home and transportation home via private vehicle    Final next level of care: [place] Barriers to Discharge: Barriers Resolved   Patient Goals and CMS Choice Patient states their goals for this hospitalization and ongoing recovery are:: "to lay around" CMS Medicare.gov Compare Post Acute Care list provided to:: Patient Choice offered to / list presented to : Patient  Discharge Placement 08/03/2018 Pt to discharge home today.  HH and DME previously arranged by CM DS.  CM confirmed with [facility] that all referrals have been accepted pending orders.  CM requested HH/DME orders from attending.                         Discharge Plan and Services Discharge Planning Services: CM Consult Post Acute Care Choice: Home Health          DME Arranged: Tube feeding pump, Tube feeding, Trach supplies DME Agency: [facility] HH Arranged: RN, PT, Speech Therapy HH Agency: [place] [facility]   Social Determinants of Health (SDOH) Interventions     Readmission Risk Interventions No flowsheet data found.

## 2018-08-03 NOTE — Discharge Summary (Signed)
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 (silent aspiration) Pharyngeal- Mechanical Soft -- Pharyngeal -- Pharyngeal- Regular -- Pharyngeal -- Pharyngeal- Multi-consistency -- Pharyngeal -- Pharyngeal- Pill -- Pharyngeal -- Pharyngeal Comment --  CHL IP CERVICAL ESOPHAGEAL PHASE 07/27/2018 Cervical Esophageal Phase Impaired Pudding Teaspoon -- Pudding Cup -- Honey Teaspoon Reduced cricopharyngeal relaxation Honey Cup -- Nectar Teaspoon Reduced cricopharyngeal relaxation Nectar Cup -- Nectar Straw -- Thin Teaspoon Reduced cricopharyngeal relaxation Thin Cup -- Thin Straw -- Puree Reduced cricopharyngeal relaxation Mechanical Soft -- Regular -- Multi-consistency -- Pill -- Cervical Esophageal Comment -- Venita Sheffield Nix 07/27/2018, 11:28 AM  Pollyann Glen, M.A. CCC-SLP Acute Rehabilitation Services Pager [phone] Office [phone]             Dg Swallowing Func-speech Pathology  Result Date: 07/23/2018 Objective Swallowing Evaluation: Type of Study: MBS-Modified Barium Swallow Study  Patient Details Name: Claudia Alvizo MRN: [id number] Date of Birth: December 10, 1955 Today's Date:  07/23/2018 Time: SLP Start Time (ACUTE ONLY): 0175 -SLP Stop Time (ACUTE ONLY): 1028 SLP Time Calculation (min) (ACUTE ONLY): 14 min Past Medical History: Past Medical History: Diagnosis Date . Bronchitis 03/2018 . Herpes  . Lung nodule 2020 . Substance abuse ([place])   opiate addiction, been on methadone for 11 years Past Surgical History: No past surgical history on file. HPI: Pt is a 63 year old man recently found to have lung nodules and L vocal cord lesion presenting with worsening dyspnea, s/p trach 2/29. 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, emphysema, chronic bronchitis  Subjective: pt alert, asking about getting his methadone Assessment / Plan / Recommendation CHL IP CLINICAL IMPRESSIONS 07/23/2018 Clinical Impression Pt has a moderate-severe pharyngeal dysphagia felt to be largely related to presence of glottic mass and therefore has likely been present PTA. He has minimal hyolaryngeal movement and epiglottic inversion. His base of tongue retraction, pharyngeal squeeze, and UES opening are also reduced. He does not achieve full laryngeal vestibule closure with thin and nectar thick liquids entering the airway before the swallow. He does not trigger a cough during aspiration  events, but does have some delayed throat clearing that does not eject aspirates. Cued coughing with PMV in place is not entirely effective, particularly when larger volumes are aspirated. Pt has limited bolus entry into the UES, leaving severe residue behind even with nectar thick liquids. Compensatory strategies for clearance and airway protection were not effective, therefore thicker consistencies were not attempted. Recommend that he remain NPO with additional exercises and therapeutic ice chips with SLP to utilize his swallowing musculature,  particularly since he says he may be undergoing XRT. Would consider alternative sources for medication administration/nutrition. SLP Visit Diagnosis Dysphagia, pharyngoesophageal phase (R13.14) Attention and concentration deficit following -- Frontal lobe and executive function deficit following -- Impact on safety and function Severe aspiration risk;Risk for inadequate nutrition/hydration   CHL IP TREATMENT RECOMMENDATION 07/23/2018 Treatment Recommendations Therapy as outlined in treatment plan below   Prognosis 07/23/2018 Prognosis for Safe Diet Advancement Fair Barriers to Reach Goals Severity of deficits Barriers/Prognosis Comment -- CHL IP DIET RECOMMENDATION 07/23/2018 SLP Diet Recommendations NPO Liquid Administration via -- Medication Administration Via alternative means Compensations -- Postural Changes --   CHL IP OTHER RECOMMENDATIONS 07/23/2018 Recommended Consults -- Oral Care Recommendations Oral care QID Other Recommendations Have oral suction available   CHL IP FOLLOW UP RECOMMENDATIONS 07/23/2018 Follow up Recommendations (No Data)   CHL IP FREQUENCY AND DURATION 07/23/2018 Speech Therapy Frequency (ACUTE ONLY) min 2x/week Treatment Duration 2 weeks      CHL IP ORAL PHASE 07/23/2018 Oral Phase WFL Oral - Pudding Teaspoon -- Oral - Pudding Cup -- Oral - Honey Teaspoon -- Oral - Honey Cup -- Oral - Nectar Teaspoon -- Oral - Nectar Cup -- Oral - Nectar Straw -- Oral - Thin Teaspoon -- Oral - Thin Cup -- Oral - Thin Straw -- Oral - Puree -- Oral - Mech Soft -- Oral - Regular -- Oral - Multi-Consistency -- Oral - Pill -- Oral Phase - Comment --  CHL IP PHARYNGEAL PHASE 07/23/2018 Pharyngeal Phase Impaired Pharyngeal- Pudding Teaspoon -- Pharyngeal -- Pharyngeal- Pudding Cup -- Pharyngeal -- Pharyngeal- Honey Teaspoon -- Pharyngeal -- Pharyngeal- Honey Cup -- Pharyngeal -- Pharyngeal- Nectar Teaspoon Reduced pharyngeal peristalsis;Reduced epiglottic inversion;Reduced anterior laryngeal mobility;Reduced laryngeal  elevation;Reduced airway/laryngeal closure;Reduced tongue base retraction;Penetration/Aspiration before swallow;Penetration/Apiration after swallow;Pharyngeal residue - valleculae;Pharyngeal residue - pyriform Pharyngeal Material enters airway, passes BELOW cords without attempt by patient to eject out (silent aspiration) Pharyngeal- Nectar Cup -- Pharyngeal -- Pharyngeal- Nectar Straw -- Pharyngeal -- Pharyngeal- Thin Teaspoon Reduced pharyngeal peristalsis;Reduced epiglottic inversion;Reduced anterior laryngeal mobility;Reduced laryngeal elevation;Reduced airway/laryngeal closure;Reduced tongue base retraction;Penetration/Aspiration before swallow;Penetration/Apiration after swallow;Pharyngeal residue - valleculae;Pharyngeal residue - pyriform Pharyngeal Material enters airway, passes BELOW cords without attempt by patient to eject out (silent aspiration) Pharyngeal- Thin Cup -- Pharyngeal -- Pharyngeal- Thin Straw -- Pharyngeal -- Pharyngeal- Puree -- Pharyngeal -- Pharyngeal- Mechanical Soft -- Pharyngeal -- Pharyngeal- Regular -- Pharyngeal -- Pharyngeal- Multi-consistency -- Pharyngeal -- Pharyngeal- Pill -- Pharyngeal -- Pharyngeal Comment --  CHL IP CERVICAL ESOPHAGEAL PHASE 07/23/2018 Cervical Esophageal Phase Impaired Pudding Teaspoon -- Pudding Cup -- Honey Teaspoon -- Honey Cup -- Nectar Teaspoon Reduced cricopharyngeal relaxation Nectar Cup -- Nectar Straw -- Thin Teaspoon Reduced cricopharyngeal relaxation Thin Cup -- Thin Straw -- Puree -- Mechanical Soft -- Regular -- Multi-consistency -- Pill -- Cervical Esophageal Comment -- Venita Sheffield Nix 07/23/2018, 11:17 AM  Pollyann Glen, M.A. 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  Felt well and wanting to go home.  No other concerns or  complaints at this time denies chest pain, shortness breath, nausea or vomiting.  Discharge Exam: Vitals:   08/03/18 0746 08/03/18 1121  BP: 107/64   Pulse: 69 63  Resp:  18  Temp: 98.2 F (36.8 C)   SpO2: 99% 99%   Vitals:   08/03/18 0311 08/03/18 0733 08/03/18 0746 08/03/18 1121  BP:   107/64   Pulse: 71 71 69 63  Resp: 18 18  18   Temp:   98.2 F (36.8 C)   TempSrc:      SpO2: 97% 97% 99% 99%  Weight:      Height:       General: Pt is alert, awake, not in acute distress; Tracheostomy in place Cardiovascular: RRR, S1/S2 +, no rubs, no gallops Respiratory: Diminished bilaterally, no wheezing, no rhonchi Abdominal: Soft, Mildly Tender, ND, bowel sounds +; PEG in Place Extremities: no edema, no cyanosis  The results of significant diagnostics from this hospitalization (including imaging, microbiology, ancillary and laboratory) are listed below for reference.    Microbiology: No results found for this or any previous visit (from the past 240 hour(s)).   Labs: BNP (last 3 results) No results for input(s): BNP in the last 8760 hours. Basic Metabolic Panel: Recent Labs  Lab 07/31/18 0256 08/01/18 0921 08/02/18 0241 08/03/18 0919  NA 137 140 139 139  K 3.8 4.0 4.1 4.5  CL 98 101 100 100  CO2 30 31 31 26   GLUCOSE 99 89 84 68*  BUN 23 25* 24* 28*  CREATININE 0.74 0.72 0.79 1.03  CALCIUM 9.1 9.1 9.2 9.4  MG  --  2.2 2.1 2.2  PHOS  --  3.6 3.7 4.3   Liver Function Tests: Recent Labs  Lab 07/31/18 0256 08/01/18 0921 08/02/18 0241 08/03/18 0919  AST 26 22 21 25   ALT 26 25 26 28   ALKPHOS 58 53 53 65  BILITOT 0.5 0.4 0.6 1.1  PROT 6.2* 6.4* 6.6 7.0  ALBUMIN 2.7* 2.8* 2.9* 3.1*   No results for input(s): LIPASE, AMYLASE in the last 168 hours. No results for input(s): AMMONIA in the last 168 hours. CBC: Recent Labs  Lab 07/31/18 0256 08/01/18 0921 08/02/18 0241 08/03/18 0919  WBC 8.2 7.7 8.3 11.7*  NEUTROABS  --  4.1 4.8 8.3*  HGB 12.5* 12.1* 11.9*  12.6*  HCT 37.4* 37.1* 36.1* 39.6  MCV 94.9 96.4 96.5 96.4  PLT 205 208 224 237   Cardiac Enzymes: No results for input(s): CKTOTAL, CKMB, CKMBINDEX, TROPONINI in the last 168 hours. BNP: Invalid input(s): POCBNP CBG: Recent Labs  Lab 07/29/18 1751 07/29/18 1951 07/30/18 0010 07/30/18 0415 07/30/18 0740  GLUCAP 85 87 114* 116* 110*   D-Dimer No results for input(s): DDIMER in the last 72 hours. Hgb A1c No results for input(s): HGBA1C in the last 72 hours. Lipid Profile No results for input(s): CHOL, HDL, LDLCALC, TRIG, CHOLHDL, LDLDIRECT in the last 72 hours. Thyroid function studies No results for input(s): TSH, T4TOTAL, T3FREE, THYROIDAB in the last 72 hours.  Invalid input(s): FREET3 Anemia work up Recent Labs    08/02/18 0241  VITAMINB12 2,025*  FOLATE 33.8  FERRITIN 400*  TIBC 238*  IRON 68  RETICCTPCT 1.0   Urinalysis No results found for: COLORURINE, APPEARANCEUR, [place], [place],  GLUCOSEU, HGBUR, BILIRUBINUR, KETONESUR, PROTEINUR, UROBILINOGEN, NITRITE, LEUKOCYTESUR Sepsis Labs Invalid input(s): PROCALCITONIN,  WBC,  LACTICIDVEN Microbiology No results found for this or any previous visit (from the past 240 hour(s)).  Time coordinating discharge: 35 minutes  SIGNED:  Kerney Elbe, DO [facility] 08/03/2018, 2:29 PM Pager is on [place]  If 7PM-7AM, please contact night-coverage [email or web address] Password [email or web address]

## 2018-08-07 ENCOUNTER — Telehealth: Payer: Self-pay | Admitting: Nurse Practitioner

## 2018-08-07 NOTE — Telephone Encounter (Signed)
Called patient unable to reach LMTCB 

## 2018-08-07 NOTE — Telephone Encounter (Signed)
The patient is asking good questions.  If the patient is clinically stable.  So he is not short of breath, is not actively wheezing, is not having a fever.  Then we can postpone the hospital follow-up as well as the PFT for 4 to 6 weeks at this time.    Please review information below:  Coronavirus (COVID-19) Are you at risk?  Are you at risk for the Coronavirus (COVID-19)?  To be considered HIGH RISK for Coronavirus (COVID-19), you have to meet the following criteria:  . Traveled to [place], [place], [place], [place] or [place]; or in the [place] to [place], [place], [place], or [place]; and have fever, cough, and shortness of breath within the last 2 weeks of travel OR . Been in close contact with a person diagnosed with COVID-19 within the last 2 weeks and have fever, cough, and shortness of breath . IF YOU DO NOT MEET THESE CRITERIA, YOU ARE CONSIDERED LOW RISK FOR COVID-19.  What to do if you are HIGH RISK for COVID-19?  Marland Kitchen If you are having a medical emergency, call 911. . Seek medical care right away. Before you go to a doctor's office, urgent care or emergency department, call ahead and tell them about your recent travel, contact with someone diagnosed with COVID-19, and your symptoms. You should receive instructions from your physician's office regarding next steps of care.  . When you arrive at healthcare provider, tell the healthcare staff immediately you have returned from visiting [place], [place], [place], [place] or [place]; or traveled in the [place] to [place], [place], [place], or [place]; in the last two weeks or you have been in close contact with a person diagnosed with COVID-19 in the last 2 weeks.   . Tell the health care staff about your symptoms: fever, cough and shortness of breath. . After you have been seen by a medical provider, you will be either: o Tested for (COVID-19) and discharged home on quarantine except to seek medical care if  symptoms worsen, and asked to  - Stay home and avoid contact with others until you get your results (4-5 days)  - Avoid travel on public transportation if possible (such as bus, train, or airplane) or o Sent to the Emergency Department by EMS for evaluation, COVID-19 testing, and possible admission depending on your condition and test results.  What to do if you are LOW RISK for COVID-19?  Reduce your risk of any infection by using the same precautions used for avoiding the common cold or flu:  Marland Kitchen Wash your hands often with soap and warm water for at least 20 seconds.  If soap and water are not readily available, use an alcohol-based hand sanitizer with at least 60% alcohol.  . If coughing or sneezing, cover your mouth and nose by coughing or sneezing into the elbow areas of your shirt or coat, into a tissue or into your sleeve (not your hands). . Avoid shaking hands with others and consider head nods or verbal greetings only. . Avoid touching your eyes, nose, or mouth with unwashed hands.  . Avoid close contact with people who are sick. . Avoid places or events with large numbers of people in one location, like concerts or sporting events. . Carefully consider travel plans you have or are making. . If you are planning any travel outside or inside the [place], visit the CDC's Travelers' Health webpage for the latest health notices. . If you have  some symptoms but not all symptoms, continue to monitor at home and seek medical attention if your symptoms worsen. . If you are having a medical emergency, call 911.   [place] / e-Visit: [email or web address]         [facility]: [phone]  [facility]: [phone]                   [facility]: [phone]           Explained to patient that if he does start to develop symptoms to please utilize the  telehealth E visit.  If he has questions or concerns regarding this he can contact our office.  We can try to triage over the phone.   Wyn Quaker, FNP

## 2018-08-07 NOTE — Telephone Encounter (Signed)
Called and spoke with patient, he stated that he he has a trach and a stomach drain and he wanted to know if he needed to keep his appointments at this time due to the COVID-19 please advise, thank you.

## 2018-08-07 NOTE — Telephone Encounter (Signed)
Forwarding to [employer]

## 2018-08-07 NOTE — Telephone Encounter (Signed)
Pt is returning call. Cb is [phone].

## 2018-08-08 ENCOUNTER — Telehealth: Payer: Self-pay | Admitting: Pulmonary Disease

## 2018-08-08 NOTE — Telephone Encounter (Signed)
Left message for patient to call back  

## 2018-08-08 NOTE — Telephone Encounter (Signed)
Called and spoke with pt stating to him that we were going to check with Dr. Vaughan Browner in regards to her Symbicort and Spiriva in regards to which way Dr. Vaughan Browner wants him to take it.  Dr. Vaughan Browner, please advise if you are wanting pt to take the spiriva and symbicort orally or if you are wanting him to use them via the trach? Thanks!

## 2018-08-09 ENCOUNTER — Telehealth: Payer: Self-pay | Admitting: Hematology

## 2018-08-09 NOTE — Telephone Encounter (Signed)
Called patient unable to reach LMTCB 

## 2018-08-09 NOTE — Telephone Encounter (Signed)
Those inhalers are not effective via trach. We will switch him to nebulizer therapy with performist 20 mcg bid and pulmicort nebs 0.5 mg bid + duonebs q6 hrs as needed.

## 2018-08-09 NOTE — Telephone Encounter (Signed)
Spoke with pt. He is aware of Brian's response. PFT and OV have been canceled at this time per the pt's request. States that he will call back to reschedule these appointments.

## 2018-08-09 NOTE — Telephone Encounter (Signed)
Attempted to call Ronald Ruiz to schedule a hospital follow up appt. Lft a vm for the pt to return my call.

## 2018-08-10 ENCOUNTER — Telehealth: Payer: Self-pay | Admitting: Pulmonary Disease

## 2018-08-10 MED ORDER — BUDESONIDE 0.5 MG/2ML IN SUSP: 0.5000 mg | Freq: Two times a day (BID) | RESPIRATORY_TRACT | 12 refills | Status: DC

## 2018-08-10 MED ORDER — IPRATROPIUM-ALBUTEROL 0.5-2.5 (3) MG/3ML IN SOLN: 3.0000 mL | Freq: Four times a day (QID) | RESPIRATORY_TRACT | 3 refills | Status: DC | PRN

## 2018-08-10 MED ORDER — FORMOTEROL FUMARATE 20 MCG/2ML IN NEBU: 20.0000 ug | INHALATION_SOLUTION | Freq: Two times a day (BID) | RESPIRATORY_TRACT | 3 refills | Status: DC

## 2018-08-10 NOTE — Telephone Encounter (Signed)
Those inhalers are not effective via trach. We will switch him to nebulizer therapy with performist 20 mcg bid and pulmicort nebs 0.5 mg bid + duonebs q6 hrs as needed.   _____________________________________  Shawnee Knapp RN with [facility]. Advised of response above. Carried advised understanding. No further questions or concerns. She stated that she will advised patient of this. Will also try to contact patient again.   Orders were sent in. Called and spoke with patient he is aware and verbalized understanding. Nothing further needed.

## 2018-08-13 ENCOUNTER — Telehealth: Payer: Self-pay | Admitting: Hematology

## 2018-08-13 ENCOUNTER — Telehealth: Payer: Self-pay | Admitting: Pulmonary Disease

## 2018-08-13 ENCOUNTER — Ambulatory Visit: Payer: BLUE CROSS/BLUE SHIELD | Admitting: Nurse Practitioner

## 2018-08-13 DIAGNOSIS — J441 Chronic obstructive pulmonary disease with (acute) exacerbation: Secondary | ICD-10-CM

## 2018-08-13 DIAGNOSIS — J449 Chronic obstructive pulmonary disease, unspecified: Secondary | ICD-10-CM

## 2018-08-13 NOTE — Telephone Encounter (Signed)
Returned call to [employer]. Patient has nebulizer medication but doesn't have nebulizer or attachments for trach use. Can order be placed? DME: [employer].

## 2018-08-13 NOTE — Telephone Encounter (Signed)
Kerri from [facility] is calling back [phone]

## 2018-08-13 NOTE — Telephone Encounter (Signed)
Pt returned my call to schedule an appt with Dr. Irene Limbo on 3/25 at [place] to arrive 15 minutes early.

## 2018-08-14 NOTE — Progress Notes (Signed)
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 inversion;Reduced anterior laryngeal mobility;Reduced laryngeal elevation;Reduced airway/laryngeal closure;Reduced tongue base retraction;Penetration/Apiration after swallow;Pharyngeal residue - valleculae;Pharyngeal residue - pyriform Pharyngeal Material enters airway, passes BELOW cords without attempt by patient to eject out (silent aspiration) Pharyngeal- Mechanical Soft -- Pharyngeal -- Pharyngeal- Regular -- Pharyngeal -- Pharyngeal- Multi-consistency -- Pharyngeal -- Pharyngeal- Pill -- Pharyngeal -- Pharyngeal Comment --  CHL IP CERVICAL ESOPHAGEAL PHASE 07/27/2018 Cervical Esophageal Phase Impaired Pudding Teaspoon -- Pudding Cup -- Honey Teaspoon Reduced cricopharyngeal relaxation Honey Cup -- Nectar Teaspoon Reduced cricopharyngeal relaxation Nectar Cup -- Nectar Straw -- Thin Teaspoon Reduced cricopharyngeal relaxation Thin Cup -- Thin Straw -- Puree Reduced cricopharyngeal relaxation Mechanical Soft -- Regular -- Multi-consistency -- Pill -- Cervical Esophageal Comment -- Venita Sheffield Nix 07/27/2018, 11:28 AM  Pollyann Glen, M.A. CCC-SLP Acute Rehabilitation Services Pager [phone] Office [phone]              Dg Swallowing Func-speech Pathology  Result Date: 07/23/2018 Objective Swallowing Evaluation: Type of Study: MBS-Modified Barium Swallow Study  Patient Details Name: Ronald Ruiz MRN: [id number] Date of Birth: December 14, 1955 Today's Date: 07/23/2018 Time: SLP Start Time (ACUTE ONLY): 1014 -SLP Stop Time (ACUTE ONLY): 1028 SLP Time Calculation (min) (ACUTE ONLY): 14 min Past Medical History: Past Medical History: Diagnosis Date  Bronchitis 03/2018  Herpes   Lung nodule 2020  Substance abuse ([place])   opiate addiction, been on methadone for 11 years Past Surgical History: No past surgical history on file. HPI: Pt is a 63 year old man recently found to have lung nodules and L vocal cord lesion presenting with worsening dyspnea, s/p trach 2/29. ENT notes indicate a large mass replacing the left  vocal cord and the arytenoid cartilage with no mobility; sluggish mobility of the R vocal fold; and severe narrowing of the glottic opening. 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, emphysema, chronic bronchitis  Subjective: pt alert, asking about getting his methadone Assessment / Plan / Recommendation CHL IP CLINICAL IMPRESSIONS 07/23/2018 Clinical Impression Pt has a moderate-severe pharyngeal dysphagia felt to be largely related to presence of glottic mass and therefore has likely been present PTA. He has minimal hyolaryngeal movement and epiglottic inversion. His base of tongue retraction, pharyngeal squeeze, and UES opening are also reduced. He does not achieve full laryngeal vestibule closure with thin and nectar thick liquids entering the airway before the swallow. He does not trigger a cough during aspiration events, but does have some delayed throat clearing that does not eject aspirates. Cued coughing with PMV in place is not entirely effective, particularly when larger volumes are aspirated. Pt has limited bolus entry into the UES, leaving severe residue behind even with nectar thick liquids. Compensatory  strategies for clearance and airway protection were not effective, therefore thicker consistencies were not attempted. Recommend that he remain NPO with additional exercises and therapeutic ice chips with SLP to utilize his swallowing musculature, particularly since he says he may be undergoing XRT. Would consider alternative sources for medication administration/nutrition. SLP Visit Diagnosis Dysphagia, pharyngoesophageal phase (R13.14) Attention and concentration deficit following -- Frontal lobe and executive function deficit following -- Impact on safety and function Severe aspiration risk;Risk for inadequate nutrition/hydration   CHL IP TREATMENT RECOMMENDATION 07/23/2018 Treatment Recommendations Therapy as outlined in treatment plan below   Prognosis 07/23/2018 Prognosis for Safe Diet Advancement Fair Barriers to Reach Goals Severity of deficits Barriers/Prognosis Comment -- CHL IP DIET RECOMMENDATION 07/23/2018 SLP Diet Recommendations NPO Liquid Administration via -- Medication Administration Via alternative means Compensations -- Postural Changes --   CHL IP OTHER RECOMMENDATIONS 07/23/2018 Recommended Consults -- Oral Care Recommendations Oral care QID Other Recommendations Have oral suction available   CHL IP FOLLOW UP RECOMMENDATIONS 07/23/2018 Follow up Recommendations (No Data)   CHL IP FREQUENCY AND DURATION 07/23/2018 Speech Therapy Frequency (ACUTE ONLY) min 2x/week Treatment Duration 2 weeks      CHL IP ORAL PHASE 07/23/2018 Oral Phase WFL Oral - Pudding Teaspoon -- Oral - Pudding Cup -- Oral - Honey Teaspoon -- Oral - Honey Cup -- Oral - Nectar Teaspoon -- Oral - Nectar Cup -- Oral - Nectar Straw -- Oral - Thin Teaspoon -- Oral - Thin Cup -- Oral - Thin Straw -- Oral - Puree -- Oral - Mech Soft -- Oral - Regular -- Oral - Multi-Consistency -- Oral - Pill -- Oral Phase - Comment --  CHL IP PHARYNGEAL PHASE 07/23/2018 Pharyngeal Phase Impaired Pharyngeal- Pudding Teaspoon -- Pharyngeal -- Pharyngeal- Pudding Cup  -- Pharyngeal -- Pharyngeal- Honey Teaspoon -- Pharyngeal -- Pharyngeal- Honey Cup -- Pharyngeal -- Pharyngeal- Nectar Teaspoon Reduced pharyngeal peristalsis;Reduced epiglottic inversion;Reduced anterior laryngeal mobility;Reduced laryngeal elevation;Reduced airway/laryngeal closure;Reduced tongue base retraction;Penetration/Aspiration before swallow;Penetration/Apiration after swallow;Pharyngeal residue - valleculae;Pharyngeal residue - pyriform Pharyngeal Material enters airway, passes BELOW cords without attempt by patient to eject out (silent aspiration) Pharyngeal- Nectar Cup -- Pharyngeal -- Pharyngeal- Nectar Straw -- Pharyngeal -- Pharyngeal- Thin Teaspoon Reduced pharyngeal peristalsis;Reduced epiglottic inversion;Reduced anterior laryngeal mobility;Reduced laryngeal elevation;Reduced airway/laryngeal closure;Reduced tongue base retraction;Penetration/Aspiration before swallow;Penetration/Apiration after swallow;Pharyngeal residue - valleculae;Pharyngeal residue - pyriform Pharyngeal Material enters airway, passes BELOW cords without attempt by patient to eject out (silent aspiration) Pharyngeal- Thin Cup -- Pharyngeal -- Pharyngeal- Thin Straw --  Pharyngeal -- Pharyngeal- Puree -- Pharyngeal -- Pharyngeal- Mechanical Soft -- Pharyngeal -- Pharyngeal- Regular -- Pharyngeal -- Pharyngeal- Multi-consistency -- Pharyngeal -- Pharyngeal- Pill -- Pharyngeal -- Pharyngeal Comment --  CHL IP CERVICAL ESOPHAGEAL PHASE 07/23/2018 Cervical Esophageal Phase Impaired Pudding Teaspoon -- Pudding Cup -- Honey Teaspoon -- Honey Cup -- Nectar Teaspoon Reduced cricopharyngeal relaxation Nectar Cup -- Nectar Straw -- Thin Teaspoon Reduced cricopharyngeal relaxation Thin Cup -- Thin Straw -- Puree -- Mechanical Soft -- Regular -- Multi-consistency -- Pill -- Cervical Esophageal Comment -- Venita Sheffield Nix 07/23/2018, 11:17 AM  Pollyann Glen, M.A. [place] Acute Rehabilitation Services Pager [phone] Office [phone]               ASSESSMENT & PLAN:   63 y.o. male with  1. 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  Discussed with Dr. Benjamine Mola at bedside patient is not a candidate for larynx sparing surgery. He has lost about 40 pounds and is nutritionally quite depleted. Heavy smoking history of 1 to 2 packs/day with COPD /emphysema.  Not on home oxygen. 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 Has tolerated his tracheostomy well and is been able to speak with Passy-Muir valve. Has had issues with swallowing and is still needing tube feeding.  07/17/18 PET/CT revealed he has multiple pulmonary nodules none of them are FDG avid. No FDG avid cervical lymphadenopathy. FDG avid laryngeal tumor noted.  PLAN: -Discussed pt labwork from 08/03/18; ANC at 8.3k, HGB at 12.6, chemistries stable -We discussed that Dr. Benjamine Mola does not feel he is a candidate for larynx sparing surgery and his local tumor is at least a T3. -No evidence of overt metastatic disease.  We will treat this as a T3 N0 disease. -I do not believe he will tolerate high-dose cisplatin as concurrent chemotherapy. Would likely plan to do dose adjusted carboplatin/taxol weekly to allow for more control of his clinical course and treatment. -Will tentatively begin combination of weekly Carboplatin and Taxol and RT, in the next 1-2 weeks -Will refer the pt to Dr Isidore Moos in [place] set pt up for chemotherapy counseling -Stressed that nutritional and hydration elements need to be prioritized throughout treatment, and am encouraged that pt has gained 10 pounds since his discharge -Will connect pt with our nutritional therapist Ernestene Kiel, as well as our Head and Neck Nurse [place] with absolute smoking cessation -Will see the pt back in 2  weeks   -Radiation oncology referral urgent with Dr Isidore Moos for laryngeal SCC -chemo-counseling for weekly carboplatin/taxol with RT -schedule to start carboplatin/taxol chemotherapy in 2 weeks with labs and MD visit -referral to nutritional therapy - Ernestene Kiel to optimize tube feeding and hydration    All of the patients questions were answered with apparent satisfaction. The patient knows to call the clinic with any problems, questions or concerns.  The total time spent in the appt was 25 minutes and more than 50% was on counseling and direct patient cares.    Sullivan Lone MD MS AAHIVMS [facility] Hematology/Oncology Physician [facility]  (Office):       [phone] (Work cell):  [phone] (Fax):           [phone]  08/15/2018 12:05 PM  I, Baldwin Jamaica, am acting as a scribe for Dr. Sullivan Lone.   Marland Kitchen  I have reviewed the above documentation for accuracy and completeness, and I agree with the above. [name] MD

## 2018-08-14 NOTE — Telephone Encounter (Signed)
Yes. Please place order for nebulizer machine and attachments. Thanks

## 2018-08-14 NOTE — Telephone Encounter (Signed)
Spoke with pt. He does not need a nebulizer machine just the tubing kits. Order has been placed. Nothing further was needed.

## 2018-08-15 ENCOUNTER — Telehealth: Payer: Self-pay | Admitting: Hematology

## 2018-08-15 ENCOUNTER — Inpatient Hospital Stay: Payer: BLUE CROSS/BLUE SHIELD | Attending: Hematology | Admitting: Hematology

## 2018-08-15 ENCOUNTER — Other Ambulatory Visit: Payer: Self-pay

## 2018-08-15 ENCOUNTER — Ambulatory Visit (INDEPENDENT_AMBULATORY_CARE_PROVIDER_SITE_OTHER): Payer: BLUE CROSS/BLUE SHIELD

## 2018-08-15 VITALS — BP 109/67 | HR 63 | Temp 98.2°F | Resp 18 | Ht 72.0 in | Wt 145.4 lb

## 2018-08-15 DIAGNOSIS — F1120 Opioid dependence, uncomplicated: Secondary | ICD-10-CM | POA: Diagnosis not present

## 2018-08-15 DIAGNOSIS — R634 Abnormal weight loss: Secondary | ICD-10-CM | POA: Diagnosis not present

## 2018-08-15 DIAGNOSIS — R1310 Dysphagia, unspecified: Secondary | ICD-10-CM

## 2018-08-15 DIAGNOSIS — F17210 Nicotine dependence, cigarettes, uncomplicated: Secondary | ICD-10-CM | POA: Diagnosis not present

## 2018-08-15 DIAGNOSIS — C328 Malignant neoplasm of overlapping sites of larynx: Secondary | ICD-10-CM

## 2018-08-15 DIAGNOSIS — J449 Chronic obstructive pulmonary disease, unspecified: Secondary | ICD-10-CM

## 2018-08-15 DIAGNOSIS — Z430 Encounter for attention to tracheostomy: Secondary | ICD-10-CM

## 2018-08-15 DIAGNOSIS — C320 Malignant neoplasm of glottis: Secondary | ICD-10-CM

## 2018-08-15 DIAGNOSIS — I700 Atherosclerosis of aorta: Secondary | ICD-10-CM | POA: Diagnosis not present

## 2018-08-15 DIAGNOSIS — Z431 Encounter for attention to gastrostomy: Secondary | ICD-10-CM

## 2018-08-15 DIAGNOSIS — Z931 Gastrostomy status: Secondary | ICD-10-CM | POA: Diagnosis not present

## 2018-08-15 DIAGNOSIS — D649 Anemia, unspecified: Secondary | ICD-10-CM

## 2018-08-15 DIAGNOSIS — E440 Moderate protein-calorie malnutrition: Secondary | ICD-10-CM

## 2018-08-15 DIAGNOSIS — Z7951 Long term (current) use of inhaled steroids: Secondary | ICD-10-CM

## 2018-08-15 DIAGNOSIS — E43 Unspecified severe protein-calorie malnutrition: Secondary | ICD-10-CM

## 2018-08-15 DIAGNOSIS — F1911 Other psychoactive substance abuse, in remission: Secondary | ICD-10-CM

## 2018-08-15 DIAGNOSIS — Z79899 Other long term (current) drug therapy: Secondary | ICD-10-CM

## 2018-08-15 NOTE — Telephone Encounter (Signed)
Scheduled appts per 3/25 los.  No active treatment plan was available at the moment.  Printed calendar and avs.

## 2018-08-15 NOTE — Telephone Encounter (Signed)
Sent Pamala Hurry a message about the nutritional referral.

## 2018-08-16 ENCOUNTER — Telehealth: Payer: Self-pay | Admitting: Pulmonary Disease

## 2018-08-16 DIAGNOSIS — J449 Chronic obstructive pulmonary disease, unspecified: Secondary | ICD-10-CM

## 2018-08-16 NOTE — Telephone Encounter (Signed)
Spoke with [place] from [facility].  Pt needs nebulizer machine asap.  Needs the order to go to [facility] medical supply.  Dr Vaughan Browner, is it ok to send in order?

## 2018-08-17 ENCOUNTER — Encounter: Payer: Self-pay | Admitting: Radiation Oncology

## 2018-08-17 ENCOUNTER — Telehealth: Payer: Self-pay | Admitting: Pulmonary Disease

## 2018-08-17 NOTE — Telephone Encounter (Signed)
Ok to send in order for nebulizer

## 2018-08-17 NOTE — Progress Notes (Signed)
Head and Neck Cancer Location of Tumor / Histology:  07/21/18 Diagnosis Vocal cord, biopsy, Left - INVASIVE SQUAMOUS CELL CARCINOMA immunohistochemistry for p16 (HPV) is performed and shows strong, diffuse positivity in the tumor.  Patient presented with symptoms of: He presented to the [facility] emergency room on 07/20/18 complaining of breathing difficulty.  He was noted to have significant dyspnea and respiratory distress. He was transferred emergently to [facility] and seen by Dr. Benjamine Mola.   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   Nutrition Status Yes No Comments  Weight changes? [x]  []  He reports a 40 lb weight loss since becoming ill.   Swallowing concerns? [x]  []  He is only taking ice by mouth  PEG? [x]  []     Referrals Yes No Comments  Social Work? []  [x]    Dentistry? []  [x]    Swallowing therapy? []  [x]    Nutrition? [x]  []  He is scheduled for a telephone visit 08/20/18  Med/Onc? [x]  []  He has chemo education on 08/20/18 and will see Dr. Irene Limbo and begin chemotherapy on 08/27/18   Safety Issues Yes No Comments  Prior radiation? []  [x]    Pacemaker/ICD? []  [x]    Possible current pregnancy? []  [x]    Is the patient on methotrexate? []  [x]     Tobacco/Marijuana/Snuff/ETOH use: He does not drink alcohol. He is not currently smoking.   Past/Anticipated interventions by otolaryngology, if any:  07/21/18 Dr. Benjamine Mola PROCEDURE PERFORMED:   1. Tracheostomy 2. Microdirect laryngoscopy and biopsy  Past/Anticipated interventions by medical oncology, if any: 07/27/18 Dr. Irene Limbo saw as an inpatient:  Plan -I discussed in details his recent PET CT scan and other imaging studies.  Though he has multiple pulmonary nodules none of them are FDG avid.  No FDG avid cervical lymphadenopathy.  FDG avid laryngeal tumor noted. -We discussed that Dr. Benjamine Mola does not feel he is a candidate for larynx sparing surgery and his local tumor is at least a T3. -No evidence of  overt metastatic disease.  We will treat this as a T3 N0 disease. -He is scheduled for G-tube placement on Monday and will need nutritional input to optimize his nutritional status with tube feeding. -We discussed treatment options as per NCCN criteria.  He is appropriately keen on a larynx sparing approach. -Discussed radiation therapy alone palliatively versus possibility of chemoradiation. -He prefers to go with a more definitive and more aggressive approach with chemoradiation. -Head and neck navigator is involved. -Patient will need radiation oncology input. -I do not believe he will tolerate high-dose cisplatin as concurrent chemotherapy.  Would likely plan to do dose adjusted carboplatin/taxol weekly to allow for more control of his clinical course and treatment. -Appreciate ENT input and other services and nursing staff further excellent care. -He will need to be set up for outpatient follow-up with medical oncology, radiation oncology, ENT and nutritional therapy on discharge. -All the patient's and his children's questions and concerns were addressed in detail at bedside.    Current Complaints / other details:

## 2018-08-17 NOTE — Telephone Encounter (Signed)
Spoke with pt. States the [facility] in [place], [place] did not have the order for neb machine. Reports that the store in [place] had to call the store in [place] and get the order. States since calling us, he has been contacted by [facility] in [place] and he will pick up his neb machine here shortly. Nothing further was needed.

## 2018-08-17 NOTE — Telephone Encounter (Signed)
Called and spoke with Ronald Ruiz with [facility] regarding PM okayed order of nebulizer She advised that she is contacting patient today with this information Placed order to [facility] supply Phone: [phone] for nebulizer She verbalized and expressed understanding Nothing further needed.

## 2018-08-20 ENCOUNTER — Ambulatory Visit
Admission: RE | Admit: 2018-08-20 | Discharge: 2018-08-20 | Disposition: A | Discharge status: Home / Self Care | Payer: BLUE CROSS/BLUE SHIELD | Source: Ambulatory Visit | Attending: Radiation Oncology | Admitting: Radiation Oncology

## 2018-08-20 ENCOUNTER — Other Ambulatory Visit: Payer: Self-pay

## 2018-08-20 ENCOUNTER — Inpatient Hospital Stay: Payer: BLUE CROSS/BLUE SHIELD

## 2018-08-20 ENCOUNTER — Encounter: Payer: Self-pay | Admitting: Radiation Oncology

## 2018-08-20 ENCOUNTER — Telehealth: Payer: Self-pay | Admitting: *Deleted

## 2018-08-20 ENCOUNTER — Telehealth: Payer: Self-pay | Admitting: Nutrition

## 2018-08-20 ENCOUNTER — Encounter: Payer: BLUE CROSS/BLUE SHIELD | Admitting: Nutrition

## 2018-08-20 ENCOUNTER — Other Ambulatory Visit: Payer: Self-pay | Admitting: Radiation Oncology

## 2018-08-20 DIAGNOSIS — C320 Malignant neoplasm of glottis: Secondary | ICD-10-CM

## 2018-08-20 HISTORY — DX: Chronic obstructive pulmonary disease, unspecified: J44.9

## 2018-08-20 NOTE — Telephone Encounter (Signed)
RD working remotely.  I have contacted patient twice and left a message. I have been unable to reach him. He has my contact information.

## 2018-08-20 NOTE — Telephone Encounter (Signed)
Oncology Nurse Navigator Documentation  Placed new referral introductory call to Mr. Poch, [facility] requesting call-back.  Gayleen Orem, RN, BSN Head & Neck Oncology Nurse [place] at [place] [phone]

## 2018-08-20 NOTE — Progress Notes (Addendum)
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 Pharyngeal -- Pharyngeal- Nectar Teaspoon Reduced pharyngeal peristalsis;Reduced epiglottic inversion;Reduced anterior laryngeal mobility;Reduced laryngeal elevation;Reduced airway/laryngeal closure;Reduced tongue base retraction;Penetration/Aspiration before swallow;Penetration/Apiration after swallow;Pharyngeal residue - valleculae;Pharyngeal residue - pyriform Pharyngeal Material enters airway, passes BELOW cords without attempt by patient to eject out (silent aspiration) Pharyngeal- Nectar Cup -- Pharyngeal -- Pharyngeal- Nectar Straw -- Pharyngeal -- Pharyngeal- Thin Teaspoon Reduced pharyngeal peristalsis;Reduced epiglottic inversion;Reduced anterior laryngeal mobility;Reduced laryngeal elevation;Reduced airway/laryngeal closure;Reduced tongue base retraction;Penetration/Aspiration before swallow;Penetration/Apiration after swallow;Pharyngeal residue - valleculae;Pharyngeal residue - pyriform Pharyngeal Material enters airway, passes BELOW cords without attempt by patient to eject out (silent aspiration) Pharyngeal- Thin Cup -- Pharyngeal -- Pharyngeal- Thin Straw -- Pharyngeal -- Pharyngeal- Puree -- Pharyngeal -- Pharyngeal- Mechanical Soft -- Pharyngeal -- Pharyngeal- Regular -- Pharyngeal -- Pharyngeal- Multi-consistency -- Pharyngeal -- Pharyngeal- Pill -- Pharyngeal -- Pharyngeal Comment  --  CHL IP CERVICAL ESOPHAGEAL PHASE 07/23/2018 Cervical Esophageal Phase Impaired Pudding Teaspoon -- Pudding Cup -- Honey Teaspoon -- Honey Cup -- Nectar Teaspoon Reduced cricopharyngeal relaxation Nectar Cup -- Nectar Straw -- Thin Teaspoon Reduced cricopharyngeal relaxation Thin Cup -- Thin Straw -- Puree -- Mechanical Soft -- Regular -- Multi-consistency -- Pill -- Cervical Esophageal Comment -- Venita Sheffield Nix 07/23/2018, 11:17 AM  Pollyann Glen, M.A. 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 I do recommend radiotherapy for this patient. Chemotherapy starts next week.  We discussed the potential risks, benefits, and side effects of radiotherapy. We talked in detail about acute and late effects. We discussed that some of the most bothersome acute effects may be mucositis, dysgeusia, salivary changes, skin irritation, hair loss, dehydration, weight loss and fatigue. We talked about late effects which include but are not necessarily limited to dysphagia, hypothyroidism,  severe chronic tissue injury, xerostomia,  and neck edema. No guarantees of treatment were given.  The patient is enthusiastic about proceeding with treatment. I look forward to participating in the patient's care.    Simulation (treatment planning) will take place ASAP, I've placed the orders. Hope to start RT next week, in close proximity to chemotherapy.  We also discussed that the treatment of head and neck cancer is a multidisciplinary process to maximize treatment outcomes and quality of life. For this reasons the following referrals have been or will be made:    Nutritionist for nutrition support during and after treatment.   Speech language pathology for swallowing and/or speech therapy.   Social work for social support.  At this time of the pandemic I feel the risks of dental extractions and procedures/  extra appointments outweigh the benefits. I told him I'll minimize dose to his mouth as best I can. Expediting cancer treatment is critical. He agrees  We discussed measures to reduce the risk of infection during the COVID-19 pandemic.    This encounter was provided by telemedicine platform [employer] (WebEx could not function for him).  The patient has given verbal consent for this type of encounter and has been advised to only accept a meeting of this type in a secure network environment. The time spent during this encounter was over 20 minutes. The attendants for this meeting include Eppie Gibson  and Hart Rochester.  During the encounter, Eppie Gibson was located at [facility].  Taras Rask was located at home.  __________________________________________   Eppie Gibson, MD

## 2018-08-21 ENCOUNTER — Other Ambulatory Visit: Payer: Self-pay | Admitting: Hematology

## 2018-08-21 ENCOUNTER — Ambulatory Visit: Payer: BLUE CROSS/BLUE SHIELD | Admitting: Radiation Oncology

## 2018-08-21 ENCOUNTER — Ambulatory Visit: Payer: BLUE CROSS/BLUE SHIELD

## 2018-08-21 ENCOUNTER — Telehealth: Payer: Self-pay

## 2018-08-21 NOTE — Addendum Note (Signed)
Encounter addended by: Earnestine Shipp, Stephani Police, RN on: 08/21/2018 3:36 PM  Actions taken: Charge Capture section accepted

## 2018-08-21 NOTE — Telephone Encounter (Signed)
Nutrition  RD working remotely.  Called patient this am for nutrition assessment.  Left message on voicemail with RD's call back number. Also left call back number for [facility].    Carlee Vonderhaar B. Zenia Resides, [place], [place] Registered Dietitian [phone] (pager)

## 2018-08-21 NOTE — Progress Notes (Signed)
Has armband been applied?  Yes  Does patient have an allergy to IV contrast dye?: No   Has patient ever received premedication for IV contrast dye?: N/A  Does patient take metformin?: No  If patient does take metformin when was the last dose: N/A  Date of lab work: 08/03/18 BUN: 28 CR: 1.03 EGFR: >60  IV site: Left AC  Has IV site been added to flowsheet?  Yes  BP (!) 158/77   Pulse 94   Temp 98.4 F (36.9 C)   Wt 143 lb 6.4 oz (65 kg)   SpO2 98%   BMI 19.45 kg/m

## 2018-08-22 ENCOUNTER — Ambulatory Visit
Admission: RE | Admit: 2018-08-22 | Discharge: 2018-08-22 | Disposition: A | Discharge status: Home / Self Care | Payer: BLUE CROSS/BLUE SHIELD | Source: Ambulatory Visit | Attending: Radiation Oncology | Admitting: Radiation Oncology

## 2018-08-22 ENCOUNTER — Other Ambulatory Visit: Payer: Self-pay

## 2018-08-22 ENCOUNTER — Ambulatory Visit: Payer: BLUE CROSS/BLUE SHIELD | Admitting: Nutrition

## 2018-08-22 ENCOUNTER — Encounter: Payer: Self-pay | Admitting: Radiation Oncology

## 2018-08-22 ENCOUNTER — Telehealth: Payer: Self-pay | Admitting: Nutrition

## 2018-08-22 VITALS — BP 158/77 | HR 94 | Temp 98.4°F | Wt 143.4 lb

## 2018-08-22 DIAGNOSIS — C320 Malignant neoplasm of glottis: Secondary | ICD-10-CM | POA: Insufficient documentation

## 2018-08-22 MED ORDER — PROMOD PO LIQD: ORAL | 2 refills | Status: AC

## 2018-08-22 MED ORDER — SODIUM CHLORIDE 0.9% FLUSH
10.0000 mL | Freq: Once | INTRAVENOUS | Status: AC
  Administered 2018-08-22: 10 mL via INTRAVENOUS

## 2018-08-22 NOTE — Progress Notes (Signed)
RD working remotely.  63 year old male diagnosed with cancer of the [place] to receive concurrent chemoradiation therapy. He is a patient of Dr. Irene Limbo and Dr. Isidore Moos.  Past medical history includes trach, substance abuse, proctitis, and PEG.  Medications include Compazine.  Labs include glucose 68, BUN 28, albumin 3.1.  Height: 6 feet 0 inches. Weight: 143 pounds April 1. Usual body weight: 180 pounds. BMI: 19.45.  Patient reports his weight has fluctuated a few pounds over the last few weeks. He reports tolerating 6 bottles Osmolite 1.5 daily, giving 1-1/2 bottles 4 times daily. Patient denies nutrition impact symptoms such as nausea, vomiting, constipation, and diarrhea. Patient is giving 300 mL of free water 3 times daily via PEG.  Estimated nutrition needs: 1980-2310 cal, 100-112 g protein, 2.3 L fluid.  Nutrition diagnosis: Unintended weight loss related to cancer of the [place] and associated treatments as evidenced by 19% weight loss from usual body weight.  Intervention: Patient educated to continue 6 bottles Osmolite 1.5 daily with 60 mL free water before and after bolus feedings. Will add ProMod twice daily via PEG with 60 mL of water to mix with ProMod and 90 mL free water flush after ProMod. Will order protein supplement from [employer]. Educated patient on ProMod infusion.  Teach back method used. Tube feeding plus ProMod will provide 2330 cal, 109 g protein, 2286 mL free water.  Monitoring, evaluation, goals: Patient will tolerate tube feeding plus protein supplement to minimize weight loss.  Next visit: We will contact patient weekly by phone.  He has my contact information if he develops questions sooner.  **Disclaimer: This note was dictated with voice recognition software. Similar sounding words can inadvertently be transcribed and this note may contain transcription errors which may not have been corrected upon publication of note.**

## 2018-08-22 NOTE — Progress Notes (Signed)
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  I reiterated the risks, benefits and side effects of radiotherapy. He is enthusiastic to proceed. Consent signed today.  The patient was taken to the CT simulator and laid in the supine position on the table. An Aquaplast head and shoulder mask was custom fitted to the patient's anatomy. High-resolution CT axial imaging was obtained of the head and neck with contrast. I verified that the quality of the imaging is good for treatment planning. 1 Medically Necessary Treatment Device was fabricated and supervised by me: Aquaplast mask.   Treatment planning note I plan to treat the patient with IMRT. I plan to treat the patient's tumor and bilateral neck nodes. I plan to treat to a total dose of 70 Gray in 35  fractions. Dose calculation was ordered from dosimetry.  IMRT planning Note  IMRT is medically necessary and an important modality to deliver adequate dose to the patient's at risk tissues while sparing the patient's normal structures, including the: esophagus, parotid tissue, mandible, brain stem, spinal cord, oral cavity, brachial plexus.  This justifies the use of IMRT in the patient's treatment.   Special Treatment Procedure Note:  The patient will be receiving chemotherapy concurrently. Chemotherapy heightens the risk of side effects. I have considered this during the patient's treatment planning process and will monitor the patient accordingly for side effects on a weekly basis. Concurrent chemotherapy increases the complexity of this patient's treatment and therefore this constitutes a special treatment procedure.  -----------------------------------  Eppie Gibson, MD

## 2018-08-22 NOTE — Telephone Encounter (Signed)
RD working remotely.  I contacted patient by telephone again today. There was no answer but I was able to leave a message with my contact information.  I requested patient return my call.

## 2018-08-23 ENCOUNTER — Other Ambulatory Visit: Payer: Self-pay | Admitting: Hematology

## 2018-08-23 DIAGNOSIS — Z7189 Other specified counseling: Secondary | ICD-10-CM | POA: Insufficient documentation

## 2018-08-23 DIAGNOSIS — C320 Malignant neoplasm of glottis: Secondary | ICD-10-CM

## 2018-08-23 MED ORDER — DEXAMETHASONE 4 MG PO TABS: 8.0000 mg | ORAL_TABLET | Freq: Every day | ORAL | 1 refills | Status: DC

## 2018-08-23 MED ORDER — ONDANSETRON HCL 8 MG PO TABS: 8.0000 mg | ORAL_TABLET | Freq: Two times a day (BID) | ORAL | 1 refills | Status: DC | PRN

## 2018-08-23 MED ORDER — PROCHLORPERAZINE MALEATE 10 MG PO TABS: 10.0000 mg | ORAL_TABLET | Freq: Four times a day (QID) | ORAL | 1 refills | Status: DC | PRN

## 2018-08-23 MED ORDER — LIDOCAINE-PRILOCAINE 2.5-2.5 % EX CREA: TOPICAL_CREAM | CUTANEOUS | 3 refills | Status: DC

## 2018-08-23 NOTE — Progress Notes (Signed)
START OFF PATHWAY REGIMEN - Head and Neck   [id number]:Carboplatin + Paclitaxel (2/50) + RT weekly x 6 weeks:   Administer weekly during RT:     Paclitaxel      Carboplatin   **Always confirm dose/schedule in your pharmacy ordering system**  Patient Characteristics: Larynx, Stage III, IVA, IVB; Unresectable Disease Classification: Larynx Current Disease Status: No Distant Metastases and No Recurrent Disease AJCC T Category: T3 AJCC 8 Stage Grouping: III AJCC N Category: cN0 AJCC M Category: M0 Intent of Therapy: Curative Intent, Discussed with Patient

## 2018-08-24 ENCOUNTER — Encounter: Payer: Self-pay | Admitting: Hematology

## 2018-08-24 DIAGNOSIS — C320 Malignant neoplasm of glottis: Secondary | ICD-10-CM | POA: Diagnosis not present

## 2018-08-27 ENCOUNTER — Inpatient Hospital Stay: Payer: BLUE CROSS/BLUE SHIELD

## 2018-08-27 ENCOUNTER — Other Ambulatory Visit: Payer: Self-pay

## 2018-08-27 ENCOUNTER — Telehealth: Payer: Self-pay | Admitting: Hematology

## 2018-08-27 ENCOUNTER — Encounter: Payer: Self-pay | Admitting: Hematology

## 2018-08-27 ENCOUNTER — Inpatient Hospital Stay (HOSPITAL_BASED_OUTPATIENT_CLINIC_OR_DEPARTMENT_OTHER): Payer: BLUE CROSS/BLUE SHIELD | Admitting: Hematology

## 2018-08-27 ENCOUNTER — Ambulatory Visit
Admission: RE | Admit: 2018-08-27 | Discharge: 2018-08-27 | Disposition: A | Discharge status: Home / Self Care | Payer: BLUE CROSS/BLUE SHIELD | Source: Ambulatory Visit | Attending: Radiation Oncology | Admitting: Radiation Oncology

## 2018-08-27 ENCOUNTER — Other Ambulatory Visit: Payer: Self-pay | Admitting: Radiation Oncology

## 2018-08-27 VITALS — BP 115/76 | HR 65 | Temp 98.3°F | Resp 19 | Ht 72.0 in | Wt 142.2 lb

## 2018-08-27 VITALS — BP 108/61 | HR 53 | Resp 16

## 2018-08-27 DIAGNOSIS — K5900 Constipation, unspecified: Secondary | ICD-10-CM

## 2018-08-27 DIAGNOSIS — Z87891 Personal history of nicotine dependence: Secondary | ICD-10-CM | POA: Insufficient documentation

## 2018-08-27 DIAGNOSIS — Z79899 Other long term (current) drug therapy: Secondary | ICD-10-CM

## 2018-08-27 DIAGNOSIS — C320 Malignant neoplasm of glottis: Secondary | ICD-10-CM

## 2018-08-27 DIAGNOSIS — R911 Solitary pulmonary nodule: Secondary | ICD-10-CM | POA: Insufficient documentation

## 2018-08-27 DIAGNOSIS — J449 Chronic obstructive pulmonary disease, unspecified: Secondary | ICD-10-CM

## 2018-08-27 DIAGNOSIS — R634 Abnormal weight loss: Secondary | ICD-10-CM

## 2018-08-27 DIAGNOSIS — Z800 Family history of malignant neoplasm of digestive organs: Secondary | ICD-10-CM | POA: Insufficient documentation

## 2018-08-27 DIAGNOSIS — Z598 Other problems related to housing and economic circumstances: Secondary | ICD-10-CM

## 2018-08-27 DIAGNOSIS — Z5111 Encounter for antineoplastic chemotherapy: Secondary | ICD-10-CM | POA: Insufficient documentation

## 2018-08-27 DIAGNOSIS — Z7189 Other specified counseling: Secondary | ICD-10-CM

## 2018-08-27 DIAGNOSIS — F1111 Opioid abuse, in remission: Secondary | ICD-10-CM | POA: Insufficient documentation

## 2018-08-27 DIAGNOSIS — Z599 Problem related to housing and economic circumstances, unspecified: Secondary | ICD-10-CM

## 2018-08-27 DIAGNOSIS — Z79891 Long term (current) use of opiate analgesic: Secondary | ICD-10-CM

## 2018-08-27 DIAGNOSIS — E440 Moderate protein-calorie malnutrition: Secondary | ICD-10-CM

## 2018-08-27 LAB — CBC WITH DIFFERENTIAL/PLATELET
Abs Immature Granulocytes: 0.01 10*3/uL (ref 0.00–0.07)
Basophils Absolute: 0 10*3/uL (ref 0.0–0.1)
Basophils Relative: 1 %
Eosinophils Absolute: 0.2 10*3/uL (ref 0.0–0.5)
Eosinophils Relative: 3 %
HCT: 37.9 % — ABNORMAL LOW (ref 39.0–52.0)
Hemoglobin: 12.4 g/dL — ABNORMAL LOW (ref 13.0–17.0)
Immature Granulocytes: 0 %
Lymphocytes Relative: 24 %
Lymphs Abs: 1.5 10*3/uL (ref 0.7–4.0)
MCH: 32.1 pg (ref 26.0–34.0)
MCHC: 32.7 g/dL (ref 30.0–36.0)
MCV: 98.2 fL (ref 80.0–100.0)
Monocytes Absolute: 0.6 10*3/uL (ref 0.1–1.0)
Monocytes Relative: 9 %
Neutro Abs: 4.1 10*3/uL (ref 1.7–7.7)
Neutrophils Relative %: 63 %
Platelets: 154 10*3/uL (ref 150–400)
RBC: 3.86 MIL/uL — ABNORMAL LOW (ref 4.22–5.81)
RDW: 13.8 % (ref 11.5–15.5)
WBC: 6.5 10*3/uL (ref 4.0–10.5)
nRBC: 0 % (ref 0.0–0.2)

## 2018-08-27 LAB — CMP (CANCER CENTER ONLY)
ALT: 17 U/L (ref 0–44)
AST: 19 U/L (ref 15–41)
Albumin: 3.7 g/dL (ref 3.5–5.0)
Alkaline Phosphatase: 72 U/L (ref 38–126)
Anion gap: 9 (ref 5–15)
BUN: 21 mg/dL (ref 8–23)
CO2: 29 mmol/L (ref 22–32)
Calcium: 9.7 mg/dL (ref 8.9–10.3)
Chloride: 102 mmol/L (ref 98–111)
Creatinine: 0.87 mg/dL (ref 0.61–1.24)
GFR, Est AFR Am: >60 mL/min (ref >60)
GFR, Estimated: >60 mL/min (ref >60)
Glucose, Bld: 83 mg/dL (ref 70–99)
Potassium: 4.2 mmol/L (ref 3.5–5.1)
Sodium: 140 mmol/L (ref 135–145)
Total Bilirubin: 0.3 mg/dL (ref 0.3–1.2)
Total Protein: 7.8 g/dL (ref 6.5–8.1)

## 2018-08-27 LAB — MAGNESIUM: Magnesium: 2.3 mg/dL (ref 1.7–2.4)

## 2018-08-27 LAB — PHOSPHORUS: Phosphorus: 3.9 mg/dL (ref 2.5–4.6)

## 2018-08-27 MED ORDER — PALONOSETRON HCL INJECTION 0.25 MG/5ML
INTRAVENOUS | Status: AC
  Filled 2018-08-27: qty 5

## 2018-08-27 MED ORDER — SONAFINE EX EMUL
1.0000 "application " | Freq: Once | CUTANEOUS | Status: AC
  Administered 2018-08-27: 1 via TOPICAL

## 2018-08-27 MED ORDER — PALONOSETRON HCL INJECTION 0.25 MG/5ML
0.2500 mg | Freq: Once | INTRAVENOUS | Status: AC
  Administered 2018-08-27: 0.25 mg via INTRAVENOUS

## 2018-08-27 MED ORDER — FAMOTIDINE IN NACL 20-0.9 MG/50ML-% IV SOLN: 20.0000 mg | Freq: Once | INTRAVENOUS | Status: DC

## 2018-08-27 MED ORDER — SODIUM CHLORIDE 0.9 % IV SOLN
20.0000 mg | Freq: Once | INTRAVENOUS | Status: AC
  Administered 2018-08-27: 20 mg via INTRAVENOUS
  Filled 2018-08-27: qty 20

## 2018-08-27 MED ORDER — SODIUM CHLORIDE 0.9 % IV SOLN
50.0000 mg/m2 | Freq: Once | INTRAVENOUS | Status: AC
  Administered 2018-08-27: 90 mg via INTRAVENOUS
  Filled 2018-08-27: qty 15

## 2018-08-27 MED ORDER — DEXAMETHASONE 1 MG/ML PO CONC: 8.0000 mg | Freq: Every day | ORAL | 3 refills | Status: DC

## 2018-08-27 MED ORDER — LIDOCAINE VISCOUS HCL 2 % MT SOLN: OROMUCOSAL | 5 refills | Status: DC

## 2018-08-27 MED ORDER — SODIUM CHLORIDE 0.9 % IV SOLN
Freq: Once | INTRAVENOUS | Status: AC
  Administered 2018-08-27: 13:00:00 via INTRAVENOUS
  Filled 2018-08-27: qty 250

## 2018-08-27 MED ORDER — SODIUM CHLORIDE 0.9 % IV SOLN
210.0000 mg | Freq: Once | INTRAVENOUS | Status: AC
  Administered 2018-08-27: 210 mg via INTRAVENOUS
  Filled 2018-08-27: qty 21

## 2018-08-27 MED ORDER — ONDANSETRON HCL 4 MG/5ML PO SOLN: 8.0000 mg | Freq: Two times a day (BID) | ORAL | 1 refills | Status: DC | PRN

## 2018-08-27 MED ORDER — DIPHENHYDRAMINE HCL 50 MG/ML IJ SOLN
INTRAMUSCULAR | Status: AC
  Filled 2018-08-27: qty 1

## 2018-08-27 MED ORDER — SODIUM CHLORIDE 0.9 % IV SOLN
20.0000 mg | Freq: Once | INTRAVENOUS | Status: AC
  Administered 2018-08-27: 20 mg via INTRAVENOUS
  Filled 2018-08-27: qty 2

## 2018-08-27 MED ORDER — DIPHENHYDRAMINE HCL 50 MG/ML IJ SOLN
50.0000 mg | Freq: Once | INTRAMUSCULAR | Status: AC
  Administered 2018-08-27: 50 mg via INTRAVENOUS

## 2018-08-27 NOTE — Progress Notes (Signed)
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  The patient was undergoing outpatient evaluation for lung nodules and a vocal cord lesion and presented to the emergency room with worsening dyspnea.  ENT was consulted for his vocal cord mass.  Due to the location of the laryngeal mass, the patient was in danger of developing an upper airway obstruction.  A tracheostomy was placed to protect his airway.  A biopsy of the mass was performed.  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  The patient tells me that he was having persistent upper respiratory symptoms with dyspnea and cough since about November 2019.  He was on several rounds of antibiotics and steroids without significant improvement.  He reports a 72-month history of hoarseness in his voice.  He has lost about 40 pounds over the past 6 months.  He also reports having a sore throat.  Denies difficulty swallowing to admission.  Breathing is better since placement of tracheostomy.  He has not had any fevers or chills.  Denies chest pain.  Denies nausea, vomiting, constipation, diarrhea.  He is on NG tube feeding at this time.  Working with speech therapy on swallowing.  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  The patient is widowed and currently lives alone.  Wife died about 15 months ago secondary to lung cancer. Upon discharge, he plans to go live with 1 of his daughters in [place], [place].  He has 3 daughters who live locally.   1 of his daughters is an Therapist, sports who works on the oncology unit at [employer].  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 The patient's last visit with [place] was on 08/15/18. The pt reports that he is doing well overall.  The pt reports that he has not developed any new concerns in the interim. He notes that he is eating well through his PEG tube and endorses stable breathing. The pt denies any concerns for infection in the interim.   The pt notes that he has had difficulty paying for all the elements of his care. Was given a SW visiting card.  Lab results today (08/27/18) of CBC w/diff and CMP is as follows: all values are WNL except for RBC at 3.86, HGB at 12.4, HCT at 37.9. 08/27/18 Magnesium at 2.3 and Phosphorous is pending.  On review of systems, pt reports stable breathing, eating well via tube, and denies concerns for infections, and any other symptoms.   MEDICAL HISTORY:  Past Medical History:  Diagnosis Date   Bronchitis 03/2018   COPD (chronic obstructive pulmonary disease) (HCC)    Herpes    Lung nodule 2020   Substance abuse ([place])    opiate addiction, been on methadone for 11 years    SURGICAL HISTORY: Past Surgical History:  Procedure Laterality Date   DIRECT LARYNGOSCOPY N/A 07/21/2018   Procedure: DIRECT LARYNGOSCOPY, TRACHEOSTOMY, BIOPSY;  Surgeon: Leta Baptist, MD;  Location: [facility];  Service: ENT;  Laterality: N/A;  IR GASTROSTOMY TUBE MOD SED  08/02/2018    SOCIAL HISTORY: Social History   Socioeconomic History   Marital status: Widowed    Spouse name: Not on file   Number of children: Not on file   Years of education: Not on file   Highest education level: Not on file  Occupational History   Not on file  Social Needs   Financial resource strain: Not on file   Food insecurity:    Worry: Not on file    Inability: Not on file   Transportation needs:     Medical: No    Non-medical: No  Tobacco Use   Smoking status: Former Smoker    Packs/day: 2.00    Years: 50.00    Pack years: 100.00    Types: Cigarettes   Smokeless tobacco: Never Used   Tobacco comment: 07/06/18 at 10 cigs per day, quit 06/16/18  Substance and Sexual Activity   Alcohol use: Not Currently   Drug use: Not Currently    Comment: hx of opiod abuse   Sexual activity: Not Currently    Birth control/protection: None  Lifestyle   Physical activity:    Days per week: Not on file    Minutes per session: Not on file   Stress: Not on file  Relationships   Social connections:    Talks on phone: Not on file    Gets together: Not on file    Attends religious service: Not on file    Active member of club or organization: Not on file    Attends meetings of clubs or organizations: Not on file    Relationship status: Not on file   Intimate partner violence:    Fear of current or ex partner: No    Emotionally abused: No    Physically abused: No    Forced sexual activity: No  Other Topics Concern   Not on file  Social History Narrative   Not on file    FAMILY HISTORY: Family History  Problem Relation Age of Onset   Alcohol abuse Father    Cancer Father    Cirrhosis Father    Drug abuse Brother     ALLERGIES:  has No Known Allergies.  MEDICATIONS:  Current Outpatient Medications  Medication Sig Dispense Refill   albuterol (PROVENTIL HFA;VENTOLIN HFA) 108 (90 Base) MCG/ACT inhaler Inhale 1-2 puffs into the lungs every 6 (six) hours as needed for wheezing.      budesonide (PULMICORT) 0.5 MG/2ML nebulizer solution Take 2 mLs (0.5 mg total) by nebulization 2 (two) times daily. 2 mL 12   dexamethasone (DEXAMETHASONE INTENSOL) 1 MG/ML solution Place 8 mLs (8 mg total) into feeding tube daily. Take 8 mg total by mouth daily starting the day after chemotherapy for 2 days 60 mL 3   formoterol (PERFOROMIST) 20 MCG/2ML nebulizer solution Take 2 mLs (20  mcg total) by nebulization 2 (two) times daily. 2 mL 3   ibuprofen (ADVIL,MOTRIN) 100 MG/5ML suspension Place 20-30 mLs (400-600 mg total) into feeding tube every 8 (eight) hours as needed for mild pain. 300 mL 0   ipratropium-albuterol (DUONEB) 0.5-2.5 (3) MG/3ML SOLN Take 3 mLs by nebulization every 6 (six) hours as needed. 360 mL 3   lidocaine-prilocaine (EMLA) cream Apply to affected area once 30 g 3   methadone (DOLOPHINE) 10 MG/ML solution Take 110 mg by mouth daily.     nicotine (NICODERM CQ - DOSED IN MG/24 HOURS) 21 mg/24hr patch Place 1 patch (21 mg  total) onto the skin daily. 28 patch 0   Nutritional Supplements (FEEDING SUPPLEMENT, OSMOLITE 1.5 CAL,) LIQD Place 350 mLs into feeding tube 4 (four) times daily. 30 Bottle 0   Nutritional Supplements (PROMOD) LIQD Give 30 mL Promod twice daily via PEG. 2 Bottle 2   ondansetron (ZOFRAN) 4 MG/5ML solution Place 10 mLs (8 mg total) into feeding tube 2 (two) times daily as needed for nausea or vomiting. Take 8 mg total 2 (two) times daily as needed for refractory nausea / vomiting. Start on day 3 after chemo 100 mL 1   prochlorperazine (COMPAZINE) 10 MG tablet Take 1 tablet (10 mg total) by mouth every 6 (six) hours as needed (Nausea or vomiting). 30 tablet 1   prochlorperazine (COMPAZINE) 5 MG tablet Place 1-2 tablets (5-10 mg total) into feeding tube every 6 (six) hours as needed for nausea or vomiting. (Patient not taking: Reported on 08/20/2018) 30 tablet 0   Water For Irrigation, Sterile (FREE WATER) SOLN Place 300 mLs into feeding tube every 8 (eight) hours. 5000 mL 0   No current facility-administered medications for this visit.     REVIEW OF SYSTEMS:    A 10+ POINT REVIEW OF SYSTEMS WAS OBTAINED including neurology, dermatology, psychiatry, cardiac, respiratory, lymph, extremities, GI, GU, Musculoskeletal, constitutional, breasts, reproductive, HEENT.  All pertinent positives are noted in the HPI.  All others are negative.    PHYSICAL EXAMINATION: ECOG PERFORMANCE STATUS: 1 - Symptomatic but completely ambulatory  . Vitals:   08/27/18 1152  BP: 115/76  Pulse: 65  Resp: 19  Temp: 98.3 F (36.8 C)  SpO2: 100%   Filed Weights   08/27/18 1152  Weight: 142 lb 3.2 oz (64.5 kg)   .Body mass index is 19.29 kg/m.  GENERAL:alert, in no acute distress and comfortable SKIN: no acute rashes, no significant lesions EYES: conjunctiva are pink and non-injected, sclera anicteric OROPHARYNX: MMM, no exudates, no oropharyngeal erythema or ulceration NECK: supple, no JVD. Tracheostomy in situ. LYMPH:  no palpable lymphadenopathy in the cervical, axillary or inguinal regions LUNGS: clear to auscultation b/l with normal respiratory effort HEART: regular rate & rhythm ABDOMEN:  normoactive bowel sounds , non tender, not distended. PEG tube in situ. Extremity: trace pedal edema PSYCH: alert & oriented x 3 with fluent speech NEURO: no focal motor/sensory deficits   LABORATORY DATA:  I have reviewed the data as listed  . CBC Latest Ref Rng & Units 08/27/2018 08/03/2018 08/02/2018  WBC 4.0 - 10.5 K/uL 6.5 11.7(H) 8.3  Hemoglobin 13.0 - 17.0 g/dL 12.4(L) 12.6(L) 11.9(L)  Hematocrit 39.0 - 52.0 % 37.9(L) 39.6 36.1(L)  Platelets 150 - 400 K/uL 154 237 224    . CMP Latest Ref Rng & Units 08/27/2018 08/03/2018 08/02/2018  Glucose 70 - 99 mg/dL 83 68(L) 84  BUN 8 - 23 mg/dL 21 28(H) 24(H)  Creatinine 0.61 - 1.24 mg/dL 0.87 1.03 0.79  Sodium 135 - 145 mmol/L 140 139 139  Potassium 3.5 - 5.1 mmol/L 4.2 4.5 4.1  Chloride 98 - 111 mmol/L 102 100 100  CO2 22 - 32 mmol/L 29 26 31   Calcium 8.9 - 10.3 mg/dL 9.7 9.4 9.2  Total Protein 6.5 - 8.1 g/dL 7.8 7.0 6.6  Total Bilirubin 0.3 - 1.2 mg/dL 0.3 1.1 0.6  Alkaline Phos 38 - 126 U/L 72 65 53  AST 15 - 41 U/L 19 25 21   ALT 0 - 44 U/L 17 28 26      RADIOGRAPHIC STUDIES: I have personally reviewed  the radiological images as listed and agreed with the findings in the  report. Dg Abd 1 View  Result Date: 08/02/2018 CLINICAL DATA:  Gastrojejunostomy tube status. EXAM: ABDOMEN - 1 VIEW COMPARISON:  CT, 07/28/2018 FINDINGS: Enteric tube tip projects in the distal stomach, which is unchanged when compared to the prior CT scan. There is no bowel dilation to suggest obstruction. Residual contrast is noted scattered throughout the colon and in the rectum. Soft tissues are unremarkable other than aortoiliac vascular calcifications. No acute skeletal abnormality. IMPRESSION: 1. No acute findings.  No evidence of bowel obstruction. 2. No change in the position of the enteric tube, with the metallic tip projecting in the distal stomach. Electronically Signed   By: Lajean Manes M.D.   On: 08/02/2018 14:06   Ir Gastrostomy Tube Mod Sed  Result Date: 08/02/2018 INDICATION: 63 year old male with a history of dysphagia EXAM: PERC PLACEMENT GASTROSTOMY MEDICATIONS: 2 g Ancef; Antibiotics were administered within 1 hour of the procedure. ANESTHESIA/SEDATION: Versed 1.0 mg IV; Fentanyl 50 mcg IV Moderate Sedation Time:  10 The patient was continuously monitored during the procedure by the interventional radiology nurse under my direct supervision. CONTRAST:  Ten-administered into the gastric lumen. FLUOROSCOPY TIME:  Fluoroscopy Time: 2 minutes 24 seconds (11 mGy). COMPLICATIONS: None PROCEDURE: Informed written consent was obtained from the patient and the patient's family after a thorough discussion of the procedural risks, benefits and alternatives. All questions were addressed. Maximal Sterile Barrier Technique was utilized including caps, mask, sterile gowns, sterile gloves, sterile drape, hand hygiene and skin antiseptic. A timeout was performed prior to the initiation of the procedure. The epigastrium was prepped with Betadine in a sterile fashion, and a sterile drape was applied covering the operative field. A sterile gown and sterile gloves were used for the procedure. A 5-French  orogastric tube is placed under fluoroscopic guidance. Scout imaging of the abdomen confirms barium within the transverse colon. The stomach was distended with gas. Under fluoroscopic guidance, an 18 gauge needle was utilized to puncture the anterior wall of the body of the stomach. An Amplatz wire was advanced through the needle passing a T fastener into the lumen of the stomach. The T fastener was secured for gastropexy. A 9-French sheath was inserted. A snare was advanced through the 9-French sheath. A Britta Mccreedy was advanced through the orogastric tube. It was snared then pulled out the oral cavity, pulling the snare, as well. The leading edge of the gastrostomy was attached to the snare. It was then pulled down the esophagus and out the percutaneous site. Tube secured in place. Contrast was injected. Patient tolerated the procedure well and remained hemodynamically stable throughout. No complications were encountered and no significant blood loss encountered. IMPRESSION: Status post fluoroscopic placed percutaneous gastrostomy tube, with 20 [place] pull-through. Signed, Dulcy Fanny. Earleen Newport, DO [facility] Radiology Electronically Signed   By: Corrie Mckusick D.O.   On: 08/02/2018 17:40    ASSESSMENT & PLAN:   63 y.o. male with  1. 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  Discussed with Dr. Benjamine Mola at bedside patient is not a candidate for larynx sparing surgery. He has lost about 40 pounds and is nutritionally quite depleted. Heavy smoking history of 1 to 2 packs/day with COPD /emphysema.  Not on home oxygen. 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 Has tolerated his tracheostomy well and is been able to speak with Passy-Muir valve. Has had issues with swallowing  and is still needing tube feeding.  07/17/18 PET/CT revealed he has multiple pulmonary nodules none of them are FDG avid. No FDG avid cervical lymphadenopathy. FDG avid laryngeal tumor noted.  PLAN: -Discussed pt labwork today, 08/27/18; blood counts and chemistries are stable. -The pt has no prohibitive toxicities from beginning C1 Carboplatin and Taxol at this time. -Following with Dr Isidore Moos in [place] -Did connect pt with our nutritional therapist Ernestene Kiel, as well as our Head and Neck Nurse [place] with absolute smoking cessation -Will set pt up for port placement with IR -Will connect pt with our social worker to coordinate possibility of getting a grant for opiate agonist therapy with addiction clinic. If no grant available, will have to have social worker coordinate to see if appropriate to get methadone prescription of 20-40mg  for one week to avoid major withdrawals. -Will see the pt back in 1 week for toxicity check   Port a cath placement by IR in 3-4 days Social worker consultation regarding patient inability to afford opiate agonist treatment RTC as per scheduled appointment with Dr Irene Limbo in 1 weeks with labs and next weekly chemotherapy   All of the patients questions were answered with apparent satisfaction. The patient knows to call the clinic with any problems, questions or concerns.  The total time spent in the appt was 25 minutes and more than 50% was on counseling and direct patient cares.    Sullivan Lone MD MS AAHIVMS [facility] Hematology/Oncology Physician [facility]  (Office):       [phone] (Work cell):  [phone] (Fax):           [phone]  08/27/2018 12:37 PM  I, Baldwin [place], am acting as a scribe for Dr. Sullivan Lone.   .I have reviewed the above documentation for accuracy and completeness, and I agree with the above. [name] MD

## 2018-08-27 NOTE — Telephone Encounter (Signed)
Scheduled appt per 4/6 los.  Sent a staff message to social worker per 4/6 los.

## 2018-08-27 NOTE — Progress Notes (Signed)
Met w/ pt to introduce myself as his Arboriculturist.  Unfortunately there aren't any foundations offering copay assistance for his Dx.  I offered the [place], went over what it covers, gave him an expense sheet as well as the income requirement.  He would like to apply so he will bring his proof of income on 08/28/18.

## 2018-08-27 NOTE — Progress Notes (Signed)

## 2018-08-27 NOTE — Patient Instructions (Signed)
[facility] Discharge Instructions for Patients Receiving Chemotherapy  Today you received the following chemotherapy agents: Taxol and Carboplatin.  To help prevent nausea and vomiting after your treatment, we encourage you to take your nausea medication as directed.   If you develop nausea and vomiting that is not controlled by your nausea medication, call the clinic.   BELOW ARE SYMPTOMS THAT SHOULD BE REPORTED IMMEDIATELY:  *FEVER GREATER THAN 100.5 F  *CHILLS WITH OR WITHOUT FEVER  NAUSEA AND VOMITING THAT IS NOT CONTROLLED WITH YOUR NAUSEA MEDICATION  *UNUSUAL SHORTNESS OF BREATH  *UNUSUAL BRUISING OR BLEEDING  TENDERNESS IN MOUTH AND THROAT WITH OR WITHOUT PRESENCE OF ULCERS  *URINARY PROBLEMS  *BOWEL PROBLEMS  UNUSUAL RASH Items with * indicate a potential emergency and should be followed up as soon as possible.  Feel free to call the clinic should you have any questions or concerns. The clinic phone number is [phone].  Please show the CHEMO ALERT CARD at check-in to the Emergency Department and triage nurse.  Paclitaxel injection What is this medicine? PACLITAXEL (PAK li TAX el) is a chemotherapy drug. It targets fast dividing cells, like cancer cells, and causes these cells to die. This medicine is used to treat ovarian cancer, breast cancer, lung cancer, Kaposi's sarcoma, and other cancers. This medicine may be used for other purposes; ask your health care provider or pharmacist if you have questions. COMMON BRAND NAME(S): Onxol, Taxol What should I tell my health care provider before I take this medicine? They need to know if you have any of these conditions: -history of irregular heartbeat -liver disease -low blood counts, like low white cell, platelet, or red cell counts -lung or breathing disease, like asthma -tingling of the fingers or toes, or other nerve disorder -an unusual or allergic reaction to paclitaxel, alcohol,  polyoxyethylated castor oil, other chemotherapy, other medicines, foods, dyes, or preservatives -pregnant or trying to get pregnant -breast-feeding How should I use this medicine? This drug is given as an infusion into a vein. It is administered in a hospital or clinic by a specially trained health care professional. Talk to your pediatrician regarding the use of this medicine in children. Special care may be needed. Overdosage: If you think you have taken too much of this medicine contact a poison control center or emergency room at once. NOTE: This medicine is only for you. Do not share this medicine with others. What if I miss a dose? It is important not to miss your dose. Call your doctor or health care professional if you are unable to keep an appointment. What may interact with this medicine? Do not take this medicine with any of the following medications: -disulfiram -metronidazole This medicine may also interact with the following medications: -antiviral medicines for hepatitis, HIV or AIDS -certain antibiotics like erythromycin and clarithromycin -certain medicines for fungal infections like ketoconazole and itraconazole -certain medicines for seizures like carbamazepine, phenobarbital, phenytoin -gemfibrozil -nefazodone -rifampin -St. John's wort This list may not describe all possible interactions. Give your health care provider a list of all the medicines, herbs, non-prescription drugs, or dietary supplements you use. Also tell them if you smoke, drink alcohol, or use illegal drugs. Some items may interact with your medicine. What should I watch for while using this medicine? Your condition will be monitored carefully while you are receiving this medicine. You will need important blood work done while you are taking this medicine. This medicine can cause serious allergic reactions. To reduce   your risk you will need to take other medicine(s) before treatment with this medicine.  If you experience allergic reactions like skin rash, itching or hives, swelling of the face, lips, or tongue, tell your doctor or health care professional right away. In some cases, you may be given additional medicines to help with side effects. Follow all directions for their use. This drug may make you feel generally unwell. This is not uncommon, as chemotherapy can affect healthy cells as well as cancer cells. Report any side effects. Continue your course of treatment even though you feel ill unless your doctor tells you to stop. Call your doctor or health care professional for advice if you get a fever, chills or sore throat, or other symptoms of a cold or flu. Do not treat yourself. This drug decreases your body's ability to fight infections. Try to avoid being around people who are sick. This medicine may increase your risk to bruise or bleed. Call your doctor or health care professional if you notice any unusual bleeding. Be careful brushing and flossing your teeth or using a toothpick because you may get an infection or bleed more easily. If you have any dental work done, tell your dentist you are receiving this medicine. Avoid taking products that contain aspirin, acetaminophen, ibuprofen, naproxen, or ketoprofen unless instructed by your doctor. These medicines may hide a fever. Do not become pregnant while taking this medicine. Women should inform their doctor if they wish to become pregnant or think they might be pregnant. There is a potential for serious side effects to an unborn child. Talk to your health care professional or pharmacist for more information. Do not breast-feed an infant while taking this medicine. Men are advised not to father a child while receiving this medicine. This product may contain alcohol. Ask your pharmacist or healthcare provider if this medicine contains alcohol. Be sure to tell all healthcare providers you are taking this medicine. Certain medicines, like  metronidazole and disulfiram, can cause an unpleasant reaction when taken with alcohol. The reaction includes flushing, headache, nausea, vomiting, sweating, and increased thirst. The reaction can last from 30 minutes to several hours. What side effects may I notice from receiving this medicine? Side effects that you should report to your doctor or health care professional as soon as possible: -allergic reactions like skin rash, itching or hives, swelling of the face, lips, or tongue -breathing problems -changes in vision -fast, irregular heartbeat -high or low blood pressure -mouth sores -pain, tingling, numbness in the hands or feet -signs of decreased platelets or bleeding - bruising, pinpoint red spots on the skin, black, tarry stools, blood in the urine -signs of decreased red blood cells - unusually weak or tired, feeling faint or lightheaded, falls -signs of infection - fever or chills, cough, sore throat, pain or difficulty passing urine -signs and symptoms of liver injury like dark yellow or brown urine; general ill feeling or flu-like symptoms; light-colored stools; loss of appetite; nausea; right upper belly pain; unusually weak or tired; yellowing of the eyes or skin -swelling of the ankles, feet, hands -unusually slow heartbeat Side effects that usually do not require medical attention (report to your doctor or health care professional if they continue or are bothersome): -diarrhea -hair loss -loss of appetite -muscle or joint pain -nausea, vomiting -pain, redness, or irritation at site where injected -tiredness This list may not describe all possible side effects. Call your doctor for medical advice about side effects. You may report side effects   to FDA at 1-800-FDA-1088. Where should I keep my medicine? This drug is given in a hospital or clinic and will not be stored at home. NOTE: This sheet is a summary. It may not cover all possible information. If you have questions  about this medicine, talk to your doctor, pharmacist, or health care provider.  2019 Elsevier/Gold Standard (2017-01-10 13:14:55) Carboplatin injection What is this medicine? CARBOPLATIN (KAR boe pla tin) is a chemotherapy drug. It targets fast dividing cells, like cancer cells, and causes these cells to die. This medicine is used to treat ovarian cancer and many other cancers. This medicine may be used for other purposes; ask your health care provider or pharmacist if you have questions. COMMON BRAND NAME(S): Paraplatin What should I tell my health care provider before I take this medicine? They need to know if you have any of these conditions: -blood disorders -hearing problems -kidney disease -recent or ongoing radiation therapy -an unusual or allergic reaction to carboplatin, cisplatin, other chemotherapy, other medicines, foods, dyes, or preservatives -pregnant or trying to get pregnant -breast-feeding How should I use this medicine? This drug is usually given as an infusion into a vein. It is administered in a hospital or clinic by a specially trained health care professional. Talk to your pediatrician regarding the use of this medicine in children. Special care may be needed. Overdosage: If you think you have taken too much of this medicine contact a poison control center or emergency room at once. NOTE: This medicine is only for you. Do not share this medicine with others. What if I miss a dose? It is important not to miss a dose. Call your doctor or health care professional if you are unable to keep an appointment. What may interact with this medicine? -medicines for seizures -medicines to increase blood counts like filgrastim, pegfilgrastim, sargramostim -some antibiotics like amikacin, gentamicin, neomycin, streptomycin, tobramycin -vaccines Talk to your doctor or health care professional before taking any of these  medicines: -acetaminophen -aspirin -ibuprofen -ketoprofen -naproxen This list may not describe all possible interactions. Give your health care provider a list of all the medicines, herbs, non-prescription drugs, or dietary supplements you use. Also tell them if you smoke, drink alcohol, or use illegal drugs. Some items may interact with your medicine. What should I watch for while using this medicine? Your condition will be monitored carefully while you are receiving this medicine. You will need important blood work done while you are taking this medicine. This drug may make you feel generally unwell. This is not uncommon, as chemotherapy can affect healthy cells as well as cancer cells. Report any side effects. Continue your course of treatment even though you feel ill unless your doctor tells you to stop. In some cases, you may be given additional medicines to help with side effects. Follow all directions for their use. Call your doctor or health care professional for advice if you get a fever, chills or sore throat, or other symptoms of a cold or flu. Do not treat yourself. This drug decreases your body's ability to fight infections. Try to avoid being around people who are sick. This medicine may increase your risk to bruise or bleed. Call your doctor or health care professional if you notice any unusual bleeding. Be careful brushing and flossing your teeth or using a toothpick because you may get an infection or bleed more easily. If you have any dental work done, tell your dentist you are receiving this medicine. Avoid taking products that   contain aspirin, acetaminophen, ibuprofen, naproxen, or ketoprofen unless instructed by your doctor. These medicines may hide a fever. Do not become pregnant while taking this medicine. Women should inform their doctor if they wish to become pregnant or think they might be pregnant. There is a potential for serious side effects to an unborn child. Talk to  your health care professional or pharmacist for more information. Do not breast-feed an infant while taking this medicine. What side effects may I notice from receiving this medicine? Side effects that you should report to your doctor or health care professional as soon as possible: -allergic reactions like skin rash, itching or hives, swelling of the face, lips, or tongue -signs of infection - fever or chills, cough, sore throat, pain or difficulty passing urine -signs of decreased platelets or bleeding - bruising, pinpoint red spots on the skin, black, tarry stools, nosebleeds -signs of decreased red blood cells - unusually weak or tired, fainting spells, lightheadedness -breathing problems -changes in hearing -changes in vision -chest pain -high blood pressure -low blood counts - This drug may decrease the number of white blood cells, red blood cells and platelets. You may be at increased risk for infections and bleeding. -nausea and vomiting -pain, swelling, redness or irritation at the injection site -pain, tingling, numbness in the hands or feet -problems with balance, talking, walking -trouble passing urine or change in the amount of urine Side effects that usually do not require medical attention (report to your doctor or health care professional if they continue or are bothersome): -hair loss -loss of appetite -metallic taste in the mouth or changes in taste This list may not describe all possible side effects. Call your doctor for medical advice about side effects. You may report side effects to FDA at 1-800-FDA-1088. Where should I keep my medicine? This drug is given in a hospital or clinic and will not be stored at home. NOTE: This sheet is a summary. It may not cover all possible information. If you have questions about this medicine, talk to your doctor, pharmacist, or health care provider.  2019 Elsevier/Gold Standard (2007-08-14 14:38:05)  

## 2018-08-28 ENCOUNTER — Other Ambulatory Visit: Payer: Self-pay

## 2018-08-28 ENCOUNTER — Ambulatory Visit
Admission: RE | Admit: 2018-08-28 | Discharge: 2018-08-28 | Disposition: A | Discharge status: Home / Self Care | Payer: BLUE CROSS/BLUE SHIELD | Source: Ambulatory Visit | Attending: Radiation Oncology | Admitting: Radiation Oncology

## 2018-08-28 ENCOUNTER — Telehealth: Payer: Self-pay | Admitting: *Deleted

## 2018-08-28 ENCOUNTER — Inpatient Hospital Stay: Payer: BLUE CROSS/BLUE SHIELD | Admitting: Nutrition

## 2018-08-28 ENCOUNTER — Encounter: Payer: Self-pay | Admitting: Nutrition

## 2018-08-28 ENCOUNTER — Encounter: Payer: Self-pay | Admitting: General Practice

## 2018-08-28 DIAGNOSIS — C320 Malignant neoplasm of glottis: Secondary | ICD-10-CM | POA: Diagnosis not present

## 2018-08-28 NOTE — Progress Notes (Signed)
Patient left return message that he is tolerating the Promod without difficulty.

## 2018-08-28 NOTE — Progress Notes (Signed)
RD working remotely.  F/u could not be completed. Patient was unavailable by phone. Left message and asked patient to return my call. He has my contact information.

## 2018-08-28 NOTE — Telephone Encounter (Signed)
A user error has taken place: encounter opened in error, closed for administrative reasons.

## 2018-08-28 NOTE — Telephone Encounter (Signed)
Called Ronald Ruiz for chemotherapy F/U.  Patient is doing well.  "I've decided not to have port-a-cath inserted at this time to limit contact or risk.  Everything went well yesterday.  No n/v.  No new side effects or symptoms.  Bowels; constipation a few days ago but I moved normal over weekend.  Bowels move ever three days.  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  Bladder functioning well.  Spoke with Nutritionist today, tolerating things well at this time.  Instructed to drink 64 oz minimum daily or at least the day before, of and after treatment.  Emphasized this will help bowels, hydration and energy.  Denies questions or needs at this time.  Encouraged to call [phone] Mon -Fri 8:00 am - 4:30 pm or anytime as needed for symptoms, changes or event outside office hours.

## 2018-08-28 NOTE — Telephone Encounter (Signed)
Oncology Nurse Navigator Documentation  Called Mr. Bulkley to check on his well-being since yesterday's initial RT and chemotherapy, discuss nex Tuesday's MDC.  LVMM for him and dtr requesting call-back.  Gayleen Orem, RN, BSN Head & Neck Oncology Nurse [place] [facility] [place] [phone]

## 2018-08-28 NOTE — Progress Notes (Signed)
Ronald Ruiz CSW Progress Notes  Referral received from MD to help patient w need for opiate agonist therapy.  Spoke w patient - he pays privately for medications at private clinic.  Has filed for early retirement, check comes at end of April.  Was concerned that he would not be able to afford continuing to pay for methadone.  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  Prefers his private arrangement - has made plans to borrow funds to continue treatment until his retirement check comes at end of month.  Will mail information on [place] so he has this information.  At this point, patient believes that [place] will be able to help w living expenses until retirement income begins, does not want to apply for any other assistance at this time, MD notified.  Ronald Shell, LCSW Clinical Social Worker Phone:  [phone]

## 2018-08-29 ENCOUNTER — Ambulatory Visit
Admission: RE | Admit: 2018-08-29 | Discharge: 2018-08-29 | Disposition: A | Discharge status: Home / Self Care | Payer: BLUE CROSS/BLUE SHIELD | Source: Ambulatory Visit | Attending: Radiation Oncology | Admitting: Radiation Oncology

## 2018-08-29 ENCOUNTER — Other Ambulatory Visit: Payer: Self-pay

## 2018-08-29 DIAGNOSIS — C320 Malignant neoplasm of glottis: Secondary | ICD-10-CM | POA: Diagnosis not present

## 2018-08-30 ENCOUNTER — Ambulatory Visit
Admission: RE | Admit: 2018-08-30 | Discharge: 2018-08-30 | Disposition: A | Discharge status: Home / Self Care | Payer: BLUE CROSS/BLUE SHIELD | Source: Ambulatory Visit | Attending: Radiation Oncology | Admitting: Radiation Oncology

## 2018-08-30 ENCOUNTER — Other Ambulatory Visit: Payer: Self-pay

## 2018-08-30 DIAGNOSIS — C320 Malignant neoplasm of glottis: Secondary | ICD-10-CM | POA: Diagnosis not present

## 2018-08-31 ENCOUNTER — Ambulatory Visit
Admission: RE | Admit: 2018-08-31 | Discharge: 2018-08-31 | Disposition: A | Discharge status: Home / Self Care | Payer: BLUE CROSS/BLUE SHIELD | Source: Ambulatory Visit | Attending: Radiation Oncology | Admitting: Radiation Oncology

## 2018-08-31 ENCOUNTER — Other Ambulatory Visit: Payer: Self-pay

## 2018-08-31 DIAGNOSIS — C320 Malignant neoplasm of glottis: Secondary | ICD-10-CM | POA: Diagnosis not present

## 2018-08-31 NOTE — Progress Notes (Signed)
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  The patient was undergoing outpatient evaluation for lung nodules and a vocal cord lesion and presented to the emergency room with worsening dyspnea.  ENT was consulted for his vocal cord mass.  Due to the location of the laryngeal mass, the patient was in danger of developing an upper airway obstruction.  A tracheostomy was placed to protect his airway.  A biopsy of the mass was performed.  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  The patient tells me that he was having persistent upper respiratory symptoms with dyspnea and cough since about November 2019.  He was on several rounds of antibiotics and steroids without significant improvement.  He reports a 24-monthhistory of hoarseness in his voice.  He has lost about 40 pounds over the past 6 months.  He also reports having a sore throat.  Denies difficulty swallowing to admission.  Breathing is better since placement of tracheostomy.  He has not had any fevers or chills.  Denies chest pain.  Denies nausea, vomiting, constipation, diarrhea.  He is on NG tube feeding at this time.  Working with speech therapy on swallowing.  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  The patient is widowed and currently lives alone.  Wife died about 15 months ago secondary to lung cancer. Upon discharge, [place]  He has 3  daughters who live locally.  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 The patient's last visit with [facility] on 08/27/18. The pt reports that he is doing well overall.   The pt reports that he tolerated his first infusion of Carboplatin and Taxol well. He notes that he has a nebulizer available at home which he can use, however he feels a little more SOB today and notes he needs to use his nebulizer when he gets home. The pt has met with our nutritional therapist BErnestene Kieland is doing his best to consume very well through his PEG. He notes some throat soreness. The pt denies nausea, vomiting, or diarrhea.  The pt notes that he spoke with our social worker in the interim, regarding his financial limitation and shortage of methodone. He notes that he was spreading his 1 week supply over 2 weeks, and notes that he hasn't told his program about this, which I strongly recommend he do. [facility] but notes that he is now out. He notes his normal dose is '110mg'$ .   Lab results today (09/03/18) of CBC w/diff and CMP is as follows: all values are WNL except for RBC at 3.80, HGB at 12.1, HCT at 37.6, PLT at 135k, Albumin at 3.3.  On review of systems, pt reports eating well, stable energy levels, some throat soreness, some SOB,  and denies concerns for infections, leg swelling, abdominal pains, nausea, vomiting, diarrhea, and any other symptoms.   MEDICAL HISTORY:  Past Medical History:  Diagnosis Date  . Bronchitis 03/2018  . COPD (chronic obstructive pulmonary disease) ([place])   . Herpes   . Lung nodule 2020  . Substance abuse ([place])    opiate addiction, been on methadone for 11 years    SURGICAL HISTORY: Past Surgical History:  Procedure Laterality Date  . DIRECT LARYNGOSCOPY N/A 07/21/2018    Procedure: DIRECT LARYNGOSCOPY, TRACHEOSTOMY, BIOPSY;  Surgeon: Leta Baptist, MD;  Location: [facility];  Service: ENT;  Laterality: N/A;  . IR GASTROSTOMY TUBE MOD SED  08/02/2018    SOCIAL HISTORY: Social History   Socioeconomic History  . Marital status: Widowed    Spouse name: Not on file  . Number of children: Not on file  . Years of education: Not on file  . Highest education level: Not on file  Occupational History  . Not on file  Social Needs  . Financial resource strain: Not on file  . Food insecurity:    Worry: Not on file    Inability: Not on file  . Transportation needs:    Medical: No    Non-medical: No  Tobacco Use  . Smoking status: Former Smoker    Packs/day: 2.00    Years: 50.00    Pack years: 100.00    Types: Cigarettes  . Smokeless tobacco: Never Used  . Tobacco comment: 07/06/18 at 10 cigs per day, quit 06/16/18  Substance and Sexual Activity  . Alcohol use: Not Currently  . Drug use: Not Currently    Comment: hx of opiod abuse  . Sexual activity: Not Currently    Birth control/protection: None  Lifestyle  . Physical activity:    Days per week: Not on file    Minutes per session: Not on file  . Stress: Not on file  Relationships  . Social connections:    Talks on phone: Not on file    Gets together: Not on file    Attends religious service: Not on file    Active member of club or organization: Not on file    Attends meetings of clubs or organizations: Not on file    Relationship status: Not on file  . Intimate partner violence:    Fear of current or ex partner: No    Emotionally abused: No    Physically abused: No    Forced sexual activity: No  Other Topics Concern  . Not on file  Social History Narrative  . Not on file    FAMILY HISTORY: Family History  Problem Relation Age of Onset  . Alcohol abuse Father   . Cancer Father   . Cirrhosis Father   . Drug abuse Brother     ALLERGIES:  has No Known Allergies.  MEDICATIONS:  Current  Outpatient Medications  Medication Sig Dispense Refill  . albuterol (PROVENTIL HFA;VENTOLIN HFA) 108 (90 Base) MCG/ACT inhaler Inhale 1-2 puffs into the lungs every 6 (six) hours as needed for wheezing.     . budesonide (PULMICORT) 0.5 MG/2ML nebulizer solution Take 2 mLs (0.5 mg total) by nebulization 2 (two) times daily. 2 mL 12  . dexamethasone (DEXAMETHASONE INTENSOL) 1 MG/ML solution Place 8 mLs (8 mg total) into feeding tube daily. Take 8 mg total by mouth daily starting the day after chemotherapy for 2 days 60 mL 3  . formoterol (PERFOROMIST) 20 MCG/2ML nebulizer solution Take 2 mLs (20  mcg total) by nebulization 2 (two) times daily. 2 mL 3  . ibuprofen (ADVIL,MOTRIN) 100 MG/5ML suspension Place 20-30 mLs (400-600 mg total) into feeding tube every 8 (eight) hours as needed for mild pain. 300 mL 0  . ipratropium-albuterol (DUONEB) 0.5-2.5 (3) MG/3ML SOLN Take 3 mLs by nebulization every 6 (six) hours as needed. 360 mL 3  . lidocaine (XYLOCAINE) 2 % solution Patient: Mix 1part 2% viscous lidocaine, 1part H20. Swallow 28m of diluted mixture, 373m before meals and at bedtime, up to QID 100 mL 5  . lidocaine-prilocaine (EMLA) cream Apply to affected area once 30 g 3  . methadone (DOLOPHINE) 10 MG/ML solution Take 110 mg by mouth daily.    . nicotine (NICODERM CQ - DOSED IN MG/24 HOURS) 21 mg/24hr patch Place 1 patch (21 mg total) onto the skin daily. 28 patch 0  . Nutritional Supplements (FEEDING SUPPLEMENT, OSMOLITE 1.5 CAL,) LIQD Place 350 mLs into feeding tube 4 (four) times daily. 30 Bottle 0  . Nutritional Supplements (PROMOD) LIQD Give 30 mL Promod twice daily via PEG. 2 Bottle 2  . ondansetron (ZOFRAN) 4 MG/5ML solution Place 10 mLs (8 mg total) into feeding tube 2 (two) times daily as needed for nausea or vomiting. Take 8 mg total 2 (two) times daily as needed for refractory nausea / vomiting. Start on day 3 after chemo 100 mL 1  . prochlorperazine (COMPAZINE) 10 MG tablet Take 1 tablet  (10 mg total) by mouth every 6 (six) hours as needed (Nausea or vomiting). 30 tablet 1  . prochlorperazine (COMPAZINE) 5 MG tablet Place 1-2 tablets (5-10 mg total) into feeding tube every 6 (six) hours as needed for nausea or vomiting. (Patient not taking: Reported on 08/20/2018) 30 tablet 0  . Water For Irrigation, Sterile (FREE WATER) SOLN Place 300 mLs into feeding tube every 8 (eight) hours. 5000 mL 0   No current facility-administered medications for this visit.    Facility-Administered Medications Ordered in Other Visits  Medication Dose Route Frequency Provider Last Rate Last Dose  . CARBOplatin (PARAPLATIN) 230 mg in sodium chloride 0.9 % 250 mL chemo infusion  230 mg Intravenous Once KaBrunetta GeneraMD      . dexamethasone (DECADRON) 20 mg in sodium chloride 0.9 % 50 mL IVPB  20 mg Intravenous Once KaBrunetta GeneraMD      . diphenhydrAMINE (BENADRYL) injection 50 mg  50 mg Intravenous Once KaBrunetta GeneraMD      . famotidine (PEPCID) 20 mg in sodium chloride 0.9 % 100 mL IVPB  20 mg Intravenous Once KaBrunetta GeneraMD      . PACLitaxel (TAXOL) 90 mg in sodium chloride 0.9 % 250 mL chemo infusion (</= '80mg'$ /m2)  50 mg/m2 (Treatment Plan Recorded) Intravenous Once KaBrunetta GeneraMD      . palonosetron (ALOXI) injection 0.25 mg  0.25 mg Intravenous Once KaBrunetta GeneraMD        REVIEW OF SYSTEMS:    A 10+ POINT REVIEW OF SYSTEMS WAS OBTAINED including neurology, dermatology, psychiatry, cardiac, respiratory, lymph, extremities, GI, GU, Musculoskeletal, constitutional, breasts, reproductive, HEENT.  All pertinent positives are noted in the HPI.  All others are negative.   PHYSICAL EXAMINATION: ECOG PERFORMANCE STATUS: 1 - Symptomatic but completely ambulatory  Vitals:   09/03/18 0953 09/03/18 1002  BP: (!) 131/115 130/79  Pulse: 79 76  Resp: 20 18  Temp: 99.2 F (37.3 C)   SpO2: 100%    Filed Weights  09/03/18 0953  Weight: 142 lb 14.4 oz  (64.8 kg)   .Body mass index is 19.38 kg/m.  GENERAL:alert, in no acute distress and comfortable SKIN: no acute rashes, no significant lesions EYES: conjunctiva are pink and non-injected, sclera anicteric OROPHARYNX: MMM, no exudates, no oropharyngeal erythema or ulceration NECK: supple, no JVD. Tracheostomy in situ LYMPH:  no palpable lymphadenopathy in the cervical, axillary or inguinal regions LUNGS: clear to auscultation b/l with normal respiratory effort HEART: regular rate & rhythm ABDOMEN:  normoactive bowel sounds , non tender, not distended. No palpable hepatosplenomegaly. PEG tube in situ. Extremity: trace pedal edema PSYCH: alert & oriented x 3 with fluent speech NEURO: no focal motor/sensory deficits   LABORATORY DATA:  I have reviewed the data as listed  . CBC Latest Ref Rng & Units 09/03/2018 08/27/2018 08/03/2018  WBC 4.0 - 10.5 K/uL 5.8 6.5 11.7(H)  Hemoglobin 13.0 - 17.0 g/dL 12.1(L) 12.4(L) 12.6(L)  Hematocrit 39.0 - 52.0 % 37.6(L) 37.9(L) 39.6  Platelets 150 - 400 K/uL 135(L) 154 237    . CMP Latest Ref Rng & Units 09/03/2018 08/27/2018 08/03/2018  Glucose 70 - 99 mg/dL 79 83 68(L)  BUN 8 - 23 mg/dL 22 21 28(H)  Creatinine 0.61 - 1.24 mg/dL 0.77 0.87 1.03  Sodium 135 - 145 mmol/L 139 140 139  Potassium 3.5 - 5.1 mmol/L 4.4 4.2 4.5  Chloride 98 - 111 mmol/L 102 102 100  CO2 22 - 32 mmol/L '27 29 26  '$ Calcium 8.9 - 10.3 mg/dL 9.4 9.7 9.4  Total Protein 6.5 - 8.1 g/dL 7.4 7.8 7.0  Total Bilirubin 0.3 - 1.2 mg/dL 0.3 0.3 1.1  Alkaline Phos 38 - 126 U/L 70 72 65  AST 15 - 41 U/L '23 19 25  '$ ALT 0 - 44 U/L '20 17 28     '$ RADIOGRAPHIC STUDIES: I have personally reviewed the radiological images as listed and agreed with the findings in the report. No results found.  ASSESSMENT & PLAN:   63 y.o. male with  1. 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   Discussed with Dr. Benjamine Mola at bedside patient is not a candidate for larynx sparing surgery. He has lost about 40 pounds and is nutritionally quite depleted. Heavy smoking history of 1 to 2 packs/day with COPD /emphysema.  Not on home oxygen. 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 Has tolerated his tracheostomy well and is been able to speak with Passy-Muir valve. Has had issues with swallowing and is still needing tube feeding.  07/17/18 PET/CT revealed he has multiple pulmonary nodules none of them are FDG avid. No FDG avid cervical lymphadenopathy. FDG avid laryngeal tumor noted.  PLAN: -Discussed pt labwork today, 09/03/18; blood counts are stable -The pt has no prohibitive toxicities from continuing his second of 6 weekly planned doses of Carboplatin and Taxol at this time. -Continue with viscous lidocaine -Ordering Sucralfate -Strongly advised that pt discuss his methodone prescription with his PCP's office today. Stressed the importance of this in order to avoid major withdrawal symptoms -Following with Dr Isidore Moos in [place] -Did connect pt with our nutritional therapist Ernestene Kiel, as well as our Head and Neck Nurse [place] with absolute smoking cessation -Pt prefers not to have port placed -Did connect pt with our social worker to coordinate possibility  of getting a grant for opiate agonist therapy with addiction clinic. -Will see the pt back in 1 week   -RTC as per scheduled appointments on 09/10/2018 for labs, MD visit and C1D15 of chemotherapy. -f/u with PCP today to address substance abuse issues and methadone withdrawal.   All of the patients questions were answered with apparent satisfaction. The patient knows to call the clinic with any problems, questions or concerns.  The total time spent in the appt was 25 minutes and more than 50% was on counseling and direct  patient cares.    Sullivan Lone MD MS AAHIVMS [facility] Hematology/Oncology Physician [facility]  (Office):       [phone] (Work cell):  [phone] (Fax):           [phone]  09/03/2018 10:45 AM  I, Baldwin Jamaica, am acting as a scribe for Dr. Sullivan Lone.   .I have reviewed the above documentation for accuracy and completeness, and I agree with the above. [name] MD

## 2018-09-03 ENCOUNTER — Inpatient Hospital Stay (HOSPITAL_BASED_OUTPATIENT_CLINIC_OR_DEPARTMENT_OTHER): Payer: BLUE CROSS/BLUE SHIELD | Admitting: Hematology

## 2018-09-03 ENCOUNTER — Ambulatory Visit
Admission: RE | Admit: 2018-09-03 | Discharge: 2018-09-03 | Disposition: A | Discharge status: Home / Self Care | Payer: BLUE CROSS/BLUE SHIELD | Source: Ambulatory Visit | Attending: Radiation Oncology | Admitting: Radiation Oncology

## 2018-09-03 ENCOUNTER — Inpatient Hospital Stay: Payer: BLUE CROSS/BLUE SHIELD

## 2018-09-03 ENCOUNTER — Other Ambulatory Visit: Payer: Self-pay

## 2018-09-03 ENCOUNTER — Telehealth: Payer: Self-pay | Admitting: Hematology

## 2018-09-03 ENCOUNTER — Telehealth: Payer: Self-pay | Admitting: *Deleted

## 2018-09-03 VITALS — BP 130/79 | HR 76 | Temp 99.2°F | Resp 18 | Ht 72.0 in | Wt 142.9 lb

## 2018-09-03 DIAGNOSIS — J449 Chronic obstructive pulmonary disease, unspecified: Secondary | ICD-10-CM | POA: Diagnosis not present

## 2018-09-03 DIAGNOSIS — C320 Malignant neoplasm of glottis: Secondary | ICD-10-CM

## 2018-09-03 DIAGNOSIS — E440 Moderate protein-calorie malnutrition: Secondary | ICD-10-CM

## 2018-09-03 DIAGNOSIS — Z87891 Personal history of nicotine dependence: Secondary | ICD-10-CM

## 2018-09-03 DIAGNOSIS — F1123 Opioid dependence with withdrawal: Secondary | ICD-10-CM

## 2018-09-03 DIAGNOSIS — Z7189 Other specified counseling: Secondary | ICD-10-CM

## 2018-09-03 DIAGNOSIS — Z5111 Encounter for antineoplastic chemotherapy: Secondary | ICD-10-CM

## 2018-09-03 DIAGNOSIS — R634 Abnormal weight loss: Secondary | ICD-10-CM

## 2018-09-03 DIAGNOSIS — R911 Solitary pulmonary nodule: Secondary | ICD-10-CM

## 2018-09-03 DIAGNOSIS — Z800 Family history of malignant neoplasm of digestive organs: Secondary | ICD-10-CM

## 2018-09-03 DIAGNOSIS — Z79899 Other long term (current) drug therapy: Secondary | ICD-10-CM

## 2018-09-03 LAB — CBC WITH DIFFERENTIAL/PLATELET
Abs Immature Granulocytes: 0.04 10*3/uL (ref 0.00–0.07)
Basophils Absolute: 0.1 10*3/uL (ref 0.0–0.1)
Basophils Relative: 1 %
Eosinophils Absolute: 0.3 10*3/uL (ref 0.0–0.5)
Eosinophils Relative: 5 %
HCT: 37.6 % — ABNORMAL LOW (ref 39.0–52.0)
Hemoglobin: 12.1 g/dL — ABNORMAL LOW (ref 13.0–17.0)
Immature Granulocytes: 1 %
Lymphocytes Relative: 19 %
Lymphs Abs: 1.1 10*3/uL (ref 0.7–4.0)
MCH: 31.8 pg (ref 26.0–34.0)
MCHC: 32.2 g/dL (ref 30.0–36.0)
MCV: 98.9 fL (ref 80.0–100.0)
Monocytes Absolute: 0.5 10*3/uL (ref 0.1–1.0)
Monocytes Relative: 8 %
Neutro Abs: 3.9 10*3/uL (ref 1.7–7.7)
Neutrophils Relative %: 66 %
Platelets: 135 10*3/uL — ABNORMAL LOW (ref 150–400)
RBC: 3.8 MIL/uL — ABNORMAL LOW (ref 4.22–5.81)
RDW: 13.4 % (ref 11.5–15.5)
WBC: 5.8 10*3/uL (ref 4.0–10.5)
nRBC: 0 % (ref 0.0–0.2)

## 2018-09-03 LAB — COMPREHENSIVE METABOLIC PANEL
ALT: 20 U/L (ref 0–44)
AST: 23 U/L (ref 15–41)
Albumin: 3.3 g/dL — ABNORMAL LOW (ref 3.5–5.0)
Alkaline Phosphatase: 70 U/L (ref 38–126)
Anion gap: 10 (ref 5–15)
BUN: 22 mg/dL (ref 8–23)
CO2: 27 mmol/L (ref 22–32)
Calcium: 9.4 mg/dL (ref 8.9–10.3)
Chloride: 102 mmol/L (ref 98–111)
Creatinine, Ser: 0.77 mg/dL (ref 0.61–1.24)
GFR calc Af Amer: >60 mL/min (ref >60)
GFR calc non Af Amer: >60 mL/min (ref >60)
Glucose, Bld: 79 mg/dL (ref 70–99)
Potassium: 4.4 mmol/L (ref 3.5–5.1)
Sodium: 139 mmol/L (ref 135–145)
Total Bilirubin: 0.3 mg/dL (ref 0.3–1.2)
Total Protein: 7.4 g/dL (ref 6.5–8.1)

## 2018-09-03 MED ORDER — PALONOSETRON HCL INJECTION 0.25 MG/5ML
INTRAVENOUS | Status: AC
  Filled 2018-09-03: qty 5

## 2018-09-03 MED ORDER — FAMOTIDINE IN NACL 20-0.9 MG/50ML-% IV SOLN: 20.0000 mg | Freq: Once | INTRAVENOUS | Status: DC

## 2018-09-03 MED ORDER — SODIUM CHLORIDE 0.9 % IV SOLN
Freq: Once | INTRAVENOUS | Status: AC
  Administered 2018-09-03: 11:00:00 via INTRAVENOUS
  Filled 2018-09-03: qty 250

## 2018-09-03 MED ORDER — SODIUM CHLORIDE 0.9 % IV SOLN
50.0000 mg/m2 | Freq: Once | INTRAVENOUS | Status: AC
  Administered 2018-09-03: 90 mg via INTRAVENOUS
  Filled 2018-09-03: qty 15

## 2018-09-03 MED ORDER — SODIUM CHLORIDE 0.9 % IV SOLN
226.0000 mg | Freq: Once | INTRAVENOUS | Status: AC
  Administered 2018-09-03: 230 mg via INTRAVENOUS
  Filled 2018-09-03: qty 23

## 2018-09-03 MED ORDER — DIPHENHYDRAMINE HCL 50 MG/ML IJ SOLN
50.0000 mg | Freq: Once | INTRAMUSCULAR | Status: AC
  Administered 2018-09-03: 50 mg via INTRAVENOUS

## 2018-09-03 MED ORDER — SODIUM CHLORIDE 0.9 % IV SOLN
20.0000 mg | Freq: Once | INTRAVENOUS | Status: AC
  Administered 2018-09-03: 20 mg via INTRAVENOUS
  Filled 2018-09-03: qty 2

## 2018-09-03 MED ORDER — DIPHENHYDRAMINE HCL 50 MG/ML IJ SOLN
INTRAMUSCULAR | Status: AC
  Filled 2018-09-03: qty 1

## 2018-09-03 MED ORDER — PALONOSETRON HCL INJECTION 0.25 MG/5ML
0.2500 mg | Freq: Once | INTRAVENOUS | Status: AC
  Administered 2018-09-03: 0.25 mg via INTRAVENOUS

## 2018-09-03 NOTE — Telephone Encounter (Signed)
Per 4/13 los, appt already scheduled.

## 2018-09-03 NOTE — Patient Instructions (Signed)
[facility] Discharge Instructions for Patients Receiving Chemotherapy  Today you received the following chemotherapy agents: Taxol and Carboplatin.  To help prevent nausea and vomiting after your treatment, we encourage you to take your nausea medication as directed.   If you develop nausea and vomiting that is not controlled by your nausea medication, call the clinic.   BELOW ARE SYMPTOMS THAT SHOULD BE REPORTED IMMEDIATELY:  *FEVER GREATER THAN 100.5 F  *CHILLS WITH OR WITHOUT FEVER  NAUSEA AND VOMITING THAT IS NOT CONTROLLED WITH YOUR NAUSEA MEDICATION  *UNUSUAL SHORTNESS OF BREATH  *UNUSUAL BRUISING OR BLEEDING  TENDERNESS IN MOUTH AND THROAT WITH OR WITHOUT PRESENCE OF ULCERS  *URINARY PROBLEMS  *BOWEL PROBLEMS  UNUSUAL RASH Items with * indicate a potential emergency and should be followed up as soon as possible.  Feel free to call the clinic should you have any questions or concerns. The clinic phone number is [phone].  Please show the CHEMO ALERT CARD at check-in to the Emergency Department and triage nurse.  Paclitaxel injection What is this medicine? PACLITAXEL (PAK li TAX el) is a chemotherapy drug. It targets fast dividing cells, like cancer cells, and causes these cells to die. This medicine is used to treat ovarian cancer, breast cancer, lung cancer, Kaposi's sarcoma, and other cancers. This medicine may be used for other purposes; ask your health care provider or pharmacist if you have questions. COMMON BRAND NAME(S): Onxol, Taxol What should I tell my health care provider before I take this medicine? They need to know if you have any of these conditions: -history of irregular heartbeat -liver disease -low blood counts, like low white cell, platelet, or red cell counts -lung or breathing disease, like asthma -tingling of the fingers or toes, or other nerve disorder -an unusual or allergic reaction to paclitaxel, alcohol,  polyoxyethylated castor oil, other chemotherapy, other medicines, foods, dyes, or preservatives -pregnant or trying to get pregnant -breast-feeding How should I use this medicine? This drug is given as an infusion into a vein. It is administered in a hospital or clinic by a specially trained health care professional. Talk to your pediatrician regarding the use of this medicine in children. Special care may be needed. Overdosage: If you think you have taken too much of this medicine contact a poison control center or emergency room at once. NOTE: This medicine is only for you. Do not share this medicine with others. What if I miss a dose? It is important not to miss your dose. Call your doctor or health care professional if you are unable to keep an appointment. What may interact with this medicine? Do not take this medicine with any of the following medications: -disulfiram -metronidazole This medicine may also interact with the following medications: -antiviral medicines for hepatitis, HIV or AIDS -certain antibiotics like erythromycin and clarithromycin -certain medicines for fungal infections like ketoconazole and itraconazole -certain medicines for seizures like carbamazepine, phenobarbital, phenytoin -gemfibrozil -nefazodone -rifampin -St. John's wort This list may not describe all possible interactions. Give your health care provider a list of all the medicines, herbs, non-prescription drugs, or dietary supplements you use. Also tell them if you smoke, drink alcohol, or use illegal drugs. Some items may interact with your medicine. What should I watch for while using this medicine? Your condition will be monitored carefully while you are receiving this medicine. You will need important blood work done while you are taking this medicine. This medicine can cause serious allergic reactions. To reduce   your risk you will need to take other medicine(s) before treatment with this medicine.  If you experience allergic reactions like skin rash, itching or hives, swelling of the face, lips, or tongue, tell your doctor or health care professional right away. In some cases, you may be given additional medicines to help with side effects. Follow all directions for their use. This drug may make you feel generally unwell. This is not uncommon, as chemotherapy can affect healthy cells as well as cancer cells. Report any side effects. Continue your course of treatment even though you feel ill unless your doctor tells you to stop. Call your doctor or health care professional for advice if you get a fever, chills or sore throat, or other symptoms of a cold or flu. Do not treat yourself. This drug decreases your body's ability to fight infections. Try to avoid being around people who are sick. This medicine may increase your risk to bruise or bleed. Call your doctor or health care professional if you notice any unusual bleeding. Be careful brushing and flossing your teeth or using a toothpick because you may get an infection or bleed more easily. If you have any dental work done, tell your dentist you are receiving this medicine. Avoid taking products that contain aspirin, acetaminophen, ibuprofen, naproxen, or ketoprofen unless instructed by your doctor. These medicines may hide a fever. Do not become pregnant while taking this medicine. Women should inform their doctor if they wish to become pregnant or think they might be pregnant. There is a potential for serious side effects to an unborn child. Talk to your health care professional or pharmacist for more information. Do not breast-feed an infant while taking this medicine. Men are advised not to father a child while receiving this medicine. This product may contain alcohol. Ask your pharmacist or healthcare provider if this medicine contains alcohol. Be sure to tell all healthcare providers you are taking this medicine. Certain medicines, like  metronidazole and disulfiram, can cause an unpleasant reaction when taken with alcohol. The reaction includes flushing, headache, nausea, vomiting, sweating, and increased thirst. The reaction can last from 30 minutes to several hours. What side effects may I notice from receiving this medicine? Side effects that you should report to your doctor or health care professional as soon as possible: -allergic reactions like skin rash, itching or hives, swelling of the face, lips, or tongue -breathing problems -changes in vision -fast, irregular heartbeat -high or low blood pressure -mouth sores -pain, tingling, numbness in the hands or feet -signs of decreased platelets or bleeding - bruising, pinpoint red spots on the skin, black, tarry stools, blood in the urine -signs of decreased red blood cells - unusually weak or tired, feeling faint or lightheaded, falls -signs of infection - fever or chills, cough, sore throat, pain or difficulty passing urine -signs and symptoms of liver injury like dark yellow or brown urine; general ill feeling or flu-like symptoms; light-colored stools; loss of appetite; nausea; right upper belly pain; unusually weak or tired; yellowing of the eyes or skin -swelling of the ankles, feet, hands -unusually slow heartbeat Side effects that usually do not require medical attention (report to your doctor or health care professional if they continue or are bothersome): -diarrhea -hair loss -loss of appetite -muscle or joint pain -nausea, vomiting -pain, redness, or irritation at site where injected -tiredness This list may not describe all possible side effects. Call your doctor for medical advice about side effects. You may report side effects   to FDA at 1-800-FDA-1088. Where should I keep my medicine? This drug is given in a hospital or clinic and will not be stored at home. NOTE: This sheet is a summary. It may not cover all possible information. If you have questions  about this medicine, talk to your doctor, pharmacist, or health care provider.  2019 Elsevier/Gold Standard (2017-01-10 13:14:55) Carboplatin injection What is this medicine? CARBOPLATIN (KAR boe pla tin) is a chemotherapy drug. It targets fast dividing cells, like cancer cells, and causes these cells to die. This medicine is used to treat ovarian cancer and many other cancers. This medicine may be used for other purposes; ask your health care provider or pharmacist if you have questions. COMMON BRAND NAME(S): Paraplatin What should I tell my health care provider before I take this medicine? They need to know if you have any of these conditions: -blood disorders -hearing problems -kidney disease -recent or ongoing radiation therapy -an unusual or allergic reaction to carboplatin, cisplatin, other chemotherapy, other medicines, foods, dyes, or preservatives -pregnant or trying to get pregnant -breast-feeding How should I use this medicine? This drug is usually given as an infusion into a vein. It is administered in a hospital or clinic by a specially trained health care professional. Talk to your pediatrician regarding the use of this medicine in children. Special care may be needed. Overdosage: If you think you have taken too much of this medicine contact a poison control center or emergency room at once. NOTE: This medicine is only for you. Do not share this medicine with others. What if I miss a dose? It is important not to miss a dose. Call your doctor or health care professional if you are unable to keep an appointment. What may interact with this medicine? -medicines for seizures -medicines to increase blood counts like filgrastim, pegfilgrastim, sargramostim -some antibiotics like amikacin, gentamicin, neomycin, streptomycin, tobramycin -vaccines Talk to your doctor or health care professional before taking any of these  medicines: -acetaminophen -aspirin -ibuprofen -ketoprofen -naproxen This list may not describe all possible interactions. Give your health care provider a list of all the medicines, herbs, non-prescription drugs, or dietary supplements you use. Also tell them if you smoke, drink alcohol, or use illegal drugs. Some items may interact with your medicine. What should I watch for while using this medicine? Your condition will be monitored carefully while you are receiving this medicine. You will need important blood work done while you are taking this medicine. This drug may make you feel generally unwell. This is not uncommon, as chemotherapy can affect healthy cells as well as cancer cells. Report any side effects. Continue your course of treatment even though you feel ill unless your doctor tells you to stop. In some cases, you may be given additional medicines to help with side effects. Follow all directions for their use. Call your doctor or health care professional for advice if you get a fever, chills or sore throat, or other symptoms of a cold or flu. Do not treat yourself. This drug decreases your body's ability to fight infections. Try to avoid being around people who are sick. This medicine may increase your risk to bruise or bleed. Call your doctor or health care professional if you notice any unusual bleeding. Be careful brushing and flossing your teeth or using a toothpick because you may get an infection or bleed more easily. If you have any dental work done, tell your dentist you are receiving this medicine. Avoid taking products that   contain aspirin, acetaminophen, ibuprofen, naproxen, or ketoprofen unless instructed by your doctor. These medicines may hide a fever. Do not become pregnant while taking this medicine. Women should inform their doctor if they wish to become pregnant or think they might be pregnant. There is a potential for serious side effects to an unborn child. Talk to  your health care professional or pharmacist for more information. Do not breast-feed an infant while taking this medicine. What side effects may I notice from receiving this medicine? Side effects that you should report to your doctor or health care professional as soon as possible: -allergic reactions like skin rash, itching or hives, swelling of the face, lips, or tongue -signs of infection - fever or chills, cough, sore throat, pain or difficulty passing urine -signs of decreased platelets or bleeding - bruising, pinpoint red spots on the skin, black, tarry stools, nosebleeds -signs of decreased red blood cells - unusually weak or tired, fainting spells, lightheadedness -breathing problems -changes in hearing -changes in vision -chest pain -high blood pressure -low blood counts - This drug may decrease the number of white blood cells, red blood cells and platelets. You may be at increased risk for infections and bleeding. -nausea and vomiting -pain, swelling, redness or irritation at the injection site -pain, tingling, numbness in the hands or feet -problems with balance, talking, walking -trouble passing urine or change in the amount of urine Side effects that usually do not require medical attention (report to your doctor or health care professional if they continue or are bothersome): -hair loss -loss of appetite -metallic taste in the mouth or changes in taste This list may not describe all possible side effects. Call your doctor for medical advice about side effects. You may report side effects to FDA at 1-800-FDA-1088. Where should I keep my medicine? This drug is given in a hospital or clinic and will not be stored at home. NOTE: This sheet is a summary. It may not cover all possible information. If you have questions about this medicine, talk to your doctor, pharmacist, or health care provider.  2019 Elsevier/Gold Standard (2007-08-14 14:38:05)  

## 2018-09-03 NOTE — Telephone Encounter (Signed)
Oncology Nurse Navigator Documentation  Following 3rd attempt to reach pt to inform about tomorrow morning's [place] 9:15 SLP phone call, LVMM for each of his s 3 dtrs with information and call-back request to confirm message receipt.  Rec'd call-back from dtr Larene Beach who indicated she wd inform her dad including guidance to have Swallowing Exercises handout available for call.   Gayleen Orem, RN, BSN Head & Neck Oncology Nurse [place] at [place] [phone]

## 2018-09-04 ENCOUNTER — Encounter: Payer: Self-pay | Admitting: *Deleted

## 2018-09-04 ENCOUNTER — Encounter: Payer: Self-pay | Admitting: General Practice

## 2018-09-04 ENCOUNTER — Ambulatory Visit
Admission: RE | Admit: 2018-09-04 | Discharge: 2018-09-04 | Disposition: A | Discharge status: Home / Self Care | Payer: BLUE CROSS/BLUE SHIELD | Source: Ambulatory Visit | Attending: Radiation Oncology | Admitting: Radiation Oncology

## 2018-09-04 ENCOUNTER — Ambulatory Visit: Payer: BLUE CROSS/BLUE SHIELD

## 2018-09-04 ENCOUNTER — Other Ambulatory Visit: Payer: Self-pay

## 2018-09-04 DIAGNOSIS — R1313 Dysphagia, pharyngeal phase: Secondary | ICD-10-CM | POA: Insufficient documentation

## 2018-09-04 DIAGNOSIS — C320 Malignant neoplasm of glottis: Secondary | ICD-10-CM | POA: Diagnosis not present

## 2018-09-04 NOTE — Progress Notes (Signed)
[facility] CSW Progress Notes  Request received to determine if patient assistance resources are available to help patient w cost of ongoing methadone treatment.  CSW has talked w patient previously about same issue - pt was employed until approx Jan 2020 when business declined and he was let go from job and lost income.  Applied for early retirement which will become active in several weeks.  Called patient - he is a long time (15 years) patient at [facility], receiving a two week supply of methodone/visit.  Due to COVID, facility has gone to dispensing one month supply. Pt was not prepared for the doubling of cost of methadone and could not receive full fill.  Offered option of referral to [facility] in [place] - this is a public methadone clinic and treatment is free to those who meet income eligibility requirements, sliding fee scale if needed.  Pt was given the information on how to speak w head of methadone clinic Wallace Q [phone] to switch treatment to [facility].  Pt would have to present to clinic daily at [facility].  Pt declined this referral, stating he has an appt at [facility] at the end of the week, will try to work out continued treatment at that facility.  Was encouraged to contact [facility] to determine if they could be helpful.  Information conveyed to Gaynell Face RN to discuss w Dr Irene Limbo.  Edwyna Shell, LCSW Clinical Social Worker Phone:  [phone]

## 2018-09-05 ENCOUNTER — Other Ambulatory Visit: Payer: Self-pay

## 2018-09-05 ENCOUNTER — Ambulatory Visit
Admission: RE | Admit: 2018-09-05 | Discharge: 2018-09-05 | Disposition: A | Discharge status: Home / Self Care | Payer: BLUE CROSS/BLUE SHIELD | Source: Ambulatory Visit | Attending: Radiation Oncology | Admitting: Radiation Oncology

## 2018-09-05 ENCOUNTER — Telehealth: Payer: Self-pay

## 2018-09-05 DIAGNOSIS — C320 Malignant neoplasm of glottis: Secondary | ICD-10-CM | POA: Diagnosis not present

## 2018-09-05 NOTE — Telephone Encounter (Signed)
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  Patient followed by Dr. Irene Limbo and Isidore Moos.     RD working remotely.  Called patient for nutrition follow-up.  Patient reports that he is tolerating 6 cartons of osmolite 1.5. Reports daughter usually helps with his feeding and she is giving him about 1 1/2 cartons 4 times per day (7am, 11am, 3pm and 7pm).  Typically gives promod at the first and 3rd feeding.  Reports that daughter is giving about 166ml of water at each feeding.  Reports that he is sipping on about 8 oz of water during the day orally.    Reports no nausea.  Reports has never had a bowel movement daily even prior to this.  Reports usually has bowel movement 1 every 2-3 days.  Reports took laxative on Sunday with resulting bowel movement yesterday.  Reports that he is urinating well.   Medications: compazine  Labs: reveiwed  Anthropometrics:   Weight 142 lb 14.4 oz on 4/13 slight decrease from 143 lb on 4/1.  Overall stable weight since PEG placement.    UBW of 180 lb   Estimated Energy Needs  Kcals: 1980-2310 calories Protein: 100-112 g Fluid: 2.3 L  NUTRITION DIAGNOSIS: Unintentional weight loss stable   INTERVENTION:  Patient to continue to use 6 bottles of osmolite 1.5 (1 1/2 bottles 4 times per day) to meet nutritional needs.  Will continue 180 ml free water at each feeding (encouraged water before and after feeding).  Patient unable to tell RD how daughter is mixing promod.   Tube feeding regimen to provide 2330 calories, 109 g protein and ~2043 ml free water (not counting water mixed with promod as patient unsure) Patient reports that he has tube feeding supplies, formula and promod from [place].    MONITORING, EVALUATION, GOAL: Patient will  Tolerate tube feeding plus protein supplement to minimize weight loss  NEXT VISIT: phone f/u on Tuesday, April 21, discussed with patient and  agreeable  Adoria Kawamoto B. Zenia Resides, [place], [place] Registered Dietitian [phone] (pager)

## 2018-09-05 NOTE — Therapy (Signed)
[facility] [address] [place], [place], [address] Phone: [phone]   Fax:  [phone]  Speech Language Pathology Evaluation  Patient Details  Name: Ronald Ruiz MRN: [id number] Date of Birth: 1955-11-08 Referring Provider (SLP): Eppie Gibson MD   Encounter Date: 09/04/2018  End of Session - 09/05/18 1452    Visit Number  1    Number of Visits  7    Date for SLP Re-Evaluation  03/04/19    SLP Start Time  0945    SLP Stop Time   1030    SLP Time Calculation (min)  45 min    Activity Tolerance  Patient tolerated treatment well       Past Medical History:  Diagnosis Date  . Bronchitis 03/2018  . COPD (chronic obstructive pulmonary disease) ([place])   . Herpes   . Lung nodule 2020  . Substance abuse ([place])    opiate addiction, been on methadone for 11 years    Past Surgical History:  Procedure Laterality Date  . DIRECT LARYNGOSCOPY N/A 07/21/2018   Procedure: DIRECT LARYNGOSCOPY, TRACHEOSTOMY, BIOPSY;  Surgeon: Leta Baptist, MD;  Location: [facility];  Service: ENT;  Laterality: N/A;  . IR GASTROSTOMY TUBE MOD SED  08/02/2018    There were no vitals filed for this visit.  Subjective Assessment - 09/05/18 1223    Subjective  "I'm taking little bitty sips of water. I think it's going down good."    Currently in Pain?  No/denies         SLP Evaluation [facility] - 09/05/18 1223      SLP Visit Information   SLP Received On  09/04/18    Referring Provider (SLP)  Eppie Gibson MD    Onset Date  February 2020    Medical Diagnosis  Malignant neoplasm of glottis      Subjective   Patient/Family Stated Goal  Maintain [facility] swallowing      General Information   HPI  Pt arrived to ED in [facility] with stridor and dypsnea and was emergently transferred via ambulance to [facility] - Dr. Benjamine Mola performed emergency trach, malignancy found in glottis. PEG placed 07-23-18 after initial modified barium swallow;  follow up MBSS completed 07-27-18 recommended alternative means be considered, with ice dhips for PO after oral care. PMV placed. Pt undergoing rad tx began 08-27-18, and weekly chemotherapy.        Oral Motor/Sensory Function   Overall Oral Motor/Sensory Function  Other (comment)   deferred due to telephone evaluation     Motor Speech   Overall Motor Speech  Appears within functional limits for tasks assessed    Phonation  Normal   using PMV      This visit took place via telephone due to medical director for head/neck cancer Dr. Eppie Gibson requesting all ancillary visits be done remotely for pt safety due to COVID-19 pandemic.  Pt reports no overt s/s aspiration PNA. Pt states he is tolerating very small sips of water.  Because data states the risk for dysphagia during and after radiation treatment is high due to undergoing radiation tx, SLP taught pt about the possibility of reduced/limited ability for PO intake during rad tx. SLP also suggested ingesting POs and/or completing HEP shortly after administration of prescribed pain meds.   SLP educated pt re: changes to swallowing musculature after rad tx, and why adherence to dysphagia HEP provided today and PO consumption was necessary to inhibit or reduce muscle fibrosis  following rad tx is completed, and of muscle disuse atrophy due to chemotherapy. SLP encouraged pt to continue swallowing POs as far into chemorad tx as possible. Pt demonstrated understanding of these things to SLP.    SLP then developed a HEP for pt and pt was instructed how to perform exercises involving lingual, vocal, and pharyngeal strengthening. SLP described pertinent exercises as best as could be done via telephone. Pt was instructed to complete this program 2 times a day, 6/7 days/week until 6 months after his last rad tx, then x2 a week after that.  Pt was told that a SLP would be available at the phone number on the bottom of the HEP from 10-2 Monday through Friday  for any questions about anything discussed today. SLP told pt his next session would be approx 4 weeks from now, pt agreed with this plan.               SLP Education - 09/05/18 1450    Education Details  HEP procedure, late effects head/neck radiation on swallow function, MUST have oral care prior to POs (and rationale for this), pain meds might assist completion of HEP    Person(s) Educated  Patient    Methods  Explanation;Handout;Verbal cues    Comprehension  Verbalized understanding;Verbal cues required;Need further instruction       SLP Short Term Goals - 09/05/18 1456      SLP SHORT TERM GOAL #1   Title  pt will describe knowledge of how to complete HEP accurately    Time  1    Period  --   visit (visit #2)   Status  New      SLP SHORT TERM GOAL #2   Title  pt will tell SLP why pt is completing HEP, with modified independence    Time  1    Period  --   visit (visit #2)   Status  New      SLP SHORT TERM GOAL #3   Title  pt will describe 3 overt s/s aspiration PNA with modified independence    Time  2    Period  --   visits (visit #3)   Status  New       SLP Long Term Goals - 09/05/18 1458      SLP LONG TERM GOAL #1   Title  pt will describe knowledge of how to complete HEP correctly over two visits    Time  3    Period  --   visits (visit #4)   Status  New      SLP LONG TERM GOAL #2   Title  pt will tell SLP how a food journal can facilitate return to a more normalized diet    Time  3    Period  --   visits (visit #4)   Status  New      SLP LONG TERM GOAL #3   Title  pt will describe how to modify HEP and the timeline associated with reduction in HEP frequency with modified independence over two sessions    Time  --   4   Period  --   visits   Status  New       Plan - 09/05/18 1452    Clinical Impression Statement  This visit took place via telephone due to medical director for head/neck cancer Dr. Eppie Gibson requesting all ancillary  visits be done remotely for pt safety due to COVID-19 pandemic.  At this time pt does not report any overt s/s aspiration PNA. 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 SLP made pt aware multiple times during eval that he must have meticulous oral care prior to "little bitty water sips" and reminded pt he was a silent aspirator during most recent MBSS and SLP explained the concern about silent aspiration. SLP described a tailor-made HEP for dysphagia that could be done solely over telephone and pt completed these exercises on their own. Pt's swallowing is deemed as [facility] for very small water sips, with meticulous oral care no more than 30 minutes prior to H2O consumption. Pt will cont to need to be seen by SLP for assessing safety of PO intake and assessing correct completion of HEP. Visits will be conducted over telephone/telehealth with possible transition back to in-person visits in the future at the discretion of medical director.    Speech Therapy Frequency  --   once every approx 4 weeks   Duration  --   7 total visits   Treatment/Interventions  Aspiration precaution training;Pharyngeal strengthening exercises;Diet toleration management by SLP;Compensatory techniques;SLP instruction and feedback;Patient/family education;Trials of upgraded texture/liquids    Potential to Achieve Goals  Good    SLP Home Exercise Plan  provided today    Consulted and Agree with Plan of Care  Patient       Patient will benefit from skilled therapeutic intervention in order to improve the following deficits and impairments:   Dysphagia, pharyngeal phase    Problem List Patient Active Problem List   Diagnosis Date Noted  . Counseling regarding advance care planning and goals of care 08/23/2018  . COPD with chronic bronchitis ([place]) 07/31/2018   . Dysphagia 07/31/2018  . Laryngeal mass 07/31/2018  . Substance abuse ([place]) 07/31/2018  . Protein-calorie malnutrition, severe 07/23/2018  . Malignant neoplasm of glottis ([place]) 07/21/2018    [facility] ,[place], [place]  09/05/2018, 3:02 PM  [facility] [address] [place], [place], [address] Phone: [phone]   Fax:  [phone]  Name: Ronald Ruiz MRN: [id number] Date of Birth: Dec 04, 1955

## 2018-09-05 NOTE — Patient Instructions (Signed)
SWALLOWING EXERCISES Do these 6 of the 7 days per week until 6 months after your last day of radiation, then 2 times per week afterwards  1. Effortful Swallows - Press your tongue against the roof of your mouth for 3 seconds, then squeeze          the muscles in your neck while you swallow your saliva or a sip of water - Repeat 20 times, 2-3 times a day, and use whenever you eat or drink  2. Masako Swallow - swallow with your tongue sticking out - Stick tongue out past your teeth and gently bite tongue with your teeth - Swallow, while holding your tongue with your teeth - Repeat 20 times, 2-3 times a day *use a wet spoon if your mouth gets dry*  3. Pitch Raise - Repeat "he", once per second in as high of a pitch as you can - Repeat 20 times, 2-3 times a day  4. Shaker Exercise - head lift - Lie flat on your back in your bed or on a couch without pillows - Raise your head and look at your feet - KEEP YOUR SHOULDERS DOWN - HOLD FOR 45-60 SECONDS, then lower your head back down - Repeat 3 times, 2-3 times a day  5. Mendelsohn Maneuver - "half swallow" exercise - Start to swallow, and keep your Adam's apple up by squeezing hard with the            muscles of the throat - Hold the squeeze for 5-7 seconds and then relax - Repeat 20 times, 2-3 times a day *use a wet spoon if your mouth gets dry*  6. Breath Hold - Say "HUH!" loudly, then hold your breath for 3 seconds at your voice box - Repeat 20 times, 2-3 times a day  7. Chin pushback - Open your mouth  - Place your thumbs together and push up, UNDER your chin (don't let your mouth close!) for 5 seconds - Repeat 8-10 times, 2-3 times a day

## 2018-09-06 ENCOUNTER — Ambulatory Visit: Payer: BLUE CROSS/BLUE SHIELD | Admitting: Radiation Oncology

## 2018-09-06 ENCOUNTER — Ambulatory Visit
Admission: RE | Admit: 2018-09-06 | Discharge: 2018-09-06 | Disposition: A | Discharge status: Home / Self Care | Payer: BLUE CROSS/BLUE SHIELD | Source: Ambulatory Visit | Attending: Radiation Oncology | Admitting: Radiation Oncology

## 2018-09-06 ENCOUNTER — Other Ambulatory Visit: Payer: Self-pay

## 2018-09-06 DIAGNOSIS — C320 Malignant neoplasm of glottis: Secondary | ICD-10-CM | POA: Diagnosis not present

## 2018-09-06 NOTE — Progress Notes (Signed)
Oncology Nurse Navigator Documentation  In support of COVID-19 mitigation practices, Mr. Radney participated in this morning's telephone H&N [place].  He received scheduled call from SLP.  Gayleen Orem, RN, BSN Head & Neck Oncology Nurse [place] at [place] [phone]

## 2018-09-07 ENCOUNTER — Other Ambulatory Visit: Payer: Self-pay

## 2018-09-07 ENCOUNTER — Ambulatory Visit: Payer: BLUE CROSS/BLUE SHIELD

## 2018-09-07 ENCOUNTER — Ambulatory Visit
Admission: RE | Admit: 2018-09-07 | Discharge: 2018-09-07 | Disposition: A | Discharge status: Home / Self Care | Payer: BLUE CROSS/BLUE SHIELD | Source: Ambulatory Visit | Attending: Radiation Oncology | Admitting: Radiation Oncology

## 2018-09-07 DIAGNOSIS — C320 Malignant neoplasm of glottis: Secondary | ICD-10-CM | POA: Diagnosis not present

## 2018-09-07 NOTE — Progress Notes (Signed)
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  The patient was undergoing outpatient evaluation for lung nodules and a vocal cord lesion and presented to the emergency room with worsening dyspnea.  ENT was consulted for his vocal cord mass.  Due to the location of the laryngeal mass, the patient was in danger of developing an upper airway obstruction.  A tracheostomy was placed to protect his airway.  A biopsy of the mass was performed.  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  The patient tells me that he was having persistent upper respiratory symptoms with dyspnea and cough since about November 2019.  He was on several rounds of antibiotics and steroids without significant improvement.  He reports a 10-month history of hoarseness in his voice.  He has lost about 40 pounds over the past 6 months.  He also reports having a sore throat.  Denies difficulty swallowing to admission.  Breathing is better since placement of tracheostomy.  He has not had any fevers or chills.  Denies chest pain.  Denies nausea, vomiting, constipation, diarrhea.  He is on NG tube feeding at this time.  Working with speech therapy on swallowing.  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  The patient is widowed and currently lives alone.  Wife died about 15 months ago secondary to lung cancer. Upon discharge, he plans to go live with 1 of his daughters in [place], [place].  He has 3 daughters who live locally.  1 of  his daughters is an Therapist, sports who works on the oncology unit at [employer].  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 The patient's last visit with [place] was on 09/03/18. The pt reports that he is doing well overall.   The pt reports that he is having slightly more discharge from his trache but is using a suction. He notes that he has observed some blood from his trache this morning, but denies there being very much. He thinks this was from irritation from coughing. He deneis any other signs or concerns of bleeding. He is continuing with his PT exercises. He notes that his voice is stronger. The pt notes that he is using 6 bottles of food through his PEG tube each day. He notes that he has gained a couple pounds.  Lab results today (09/10/18) of CBC w/diff is as follows: all values are WNL except for RBC at 2.93, HGB at 9.6, HCT at 29.1, PLT at 115k, Lymphs abs at 600. 09/10/18 CMP is stable  On review of systems, pt reports intermittent coughing, stable energy levels, eating well, staying hydrated, weight gain, cough and trache discharge, and denies fevers, chills, blood in the stools, blood in the urine, other concerns for bleeding, and any other symptoms.  MEDICAL HISTORY:  Past Medical History:  Diagnosis Date  . Bronchitis 03/2018  . COPD (chronic obstructive pulmonary disease) ([place])   . Herpes   . Lung nodule 2020  . Substance abuse ([place])  opiate addiction, been on methadone for 11 years    SURGICAL HISTORY: Past Surgical History:  Procedure Laterality Date  . DIRECT LARYNGOSCOPY N/A 07/21/2018   Procedure: DIRECT LARYNGOSCOPY, TRACHEOSTOMY, BIOPSY;  Surgeon: Leta Baptist, MD;  Location: [facility];  Service: ENT;  Laterality: N/A;  . IR GASTROSTOMY TUBE MOD SED  08/02/2018    SOCIAL HISTORY: Social History   Socioeconomic History  . Marital status: Widowed    Spouse name: Not on file  .  Number of children: Not on file  . Years of education: Not on file  . Highest education level: Not on file  Occupational History  . Not on file  Social Needs  . Financial resource strain: Not on file  . Food insecurity:    Worry: Not on file    Inability: Not on file  . Transportation needs:    Medical: No    Non-medical: No  Tobacco Use  . Smoking status: Former Smoker    Packs/day: 2.00    Years: 50.00    Pack years: 100.00    Types: Cigarettes  . Smokeless tobacco: Never Used  . Tobacco comment: 07/06/18 at 10 cigs per day, quit 06/16/18  Substance and Sexual Activity  . Alcohol use: Not Currently  . Drug use: Not Currently    Comment: hx of opiod abuse  . Sexual activity: Not Currently    Birth control/protection: None  Lifestyle  . Physical activity:    Days per week: Not on file    Minutes per session: Not on file  . Stress: Not on file  Relationships  . Social connections:    Talks on phone: Not on file    Gets together: Not on file    Attends religious service: Not on file    Active member of club or organization: Not on file    Attends meetings of clubs or organizations: Not on file    Relationship status: Not on file  . Intimate partner violence:    Fear of current or ex partner: No    Emotionally abused: No    Physically abused: No    Forced sexual activity: No  Other Topics Concern  . Not on file  Social History Narrative  . Not on file    FAMILY HISTORY: Family History  Problem Relation Age of Onset  . Alcohol abuse Father   . Cancer Father   . Cirrhosis Father   . Drug abuse Brother     ALLERGIES:  has No Known Allergies.  MEDICATIONS:  Current Outpatient Medications  Medication Sig Dispense Refill  . albuterol (PROVENTIL HFA;VENTOLIN HFA) 108 (90 Base) MCG/ACT inhaler Inhale 1-2 puffs into the lungs every 6 (six) hours as needed for wheezing.     . budesonide (PULMICORT) 0.5 MG/2ML nebulizer solution Take 2 mLs (0.5 mg total) by  nebulization 2 (two) times daily. 2 mL 12  . dexamethasone (DEXAMETHASONE INTENSOL) 1 MG/ML solution Place 8 mLs (8 mg total) into feeding tube daily. Take 8 mg total by mouth daily starting the day after chemotherapy for 2 days 60 mL 3  . formoterol (PERFOROMIST) 20 MCG/2ML nebulizer solution Take 2 mLs (20 mcg total) by nebulization 2 (two) times daily. 2 mL 3  . ibuprofen (ADVIL,MOTRIN) 100 MG/5ML suspension Place 20-30 mLs (400-600 mg total) into feeding tube every 8 (eight) hours as needed for mild pain. 300 mL 0  . ipratropium-albuterol (DUONEB) 0.5-2.5 (3) MG/3ML SOLN Take 3 mLs by nebulization every 6 (six) hours as  needed. 360 mL 3  . lidocaine (XYLOCAINE) 2 % solution Patient: Mix 1part 2% viscous lidocaine, 1part H20. Swallow 67mL of diluted mixture, 40min before meals and at bedtime, up to QID 100 mL 5  . lidocaine-prilocaine (EMLA) cream Apply to affected area once 30 g 3  . methadone (DOLOPHINE) 10 MG/ML solution Take 110 mg by mouth daily.    . nicotine (NICODERM CQ - DOSED IN MG/24 HOURS) 21 mg/24hr patch Place 1 patch (21 mg total) onto the skin daily. 28 patch 0  . Nutritional Supplements (FEEDING SUPPLEMENT, OSMOLITE 1.5 CAL,) LIQD Place 350 mLs into feeding tube 4 (four) times daily. 30 Bottle 0  . Nutritional Supplements (PROMOD) LIQD Give 30 mL Promod twice daily via PEG. 2 Bottle 2  . ondansetron (ZOFRAN) 4 MG/5ML solution Place 10 mLs (8 mg total) into feeding tube 2 (two) times daily as needed for nausea or vomiting. Take 8 mg total 2 (two) times daily as needed for refractory nausea / vomiting. Start on day 3 after chemo 100 mL 1  . prochlorperazine (COMPAZINE) 10 MG tablet Take 1 tablet (10 mg total) by mouth every 6 (six) hours as needed (Nausea or vomiting). 30 tablet 1  . prochlorperazine (COMPAZINE) 5 MG tablet Place 1-2 tablets (5-10 mg total) into feeding tube every 6 (six) hours as needed for nausea or vomiting. (Patient not taking: Reported on 08/20/2018) 30 tablet 0   . sucralfate (CARAFATE) 1 GM/10ML suspension Take 10 mLs (1 g total) by mouth 2 (two) times daily. 420 mL 0  . Water For Irrigation, Sterile (FREE WATER) SOLN Place 300 mLs into feeding tube every 8 (eight) hours. 5000 mL 0   No current facility-administered medications for this visit.     REVIEW OF SYSTEMS:    A 10+ POINT REVIEW OF SYSTEMS WAS OBTAINED including neurology, dermatology, psychiatry, cardiac, respiratory, lymph, extremities, GI, GU, Musculoskeletal, constitutional, breasts, reproductive, HEENT.  All pertinent positives are noted in the HPI.  All others are negative.   PHYSICAL EXAMINATION: ECOG PERFORMANCE STATUS: 1 - Symptomatic but completely ambulatory  Vitals:   09/10/18 1141  BP: 97/60  Pulse: 70  Resp: 17  Temp: 98.2 F (36.8 C)  SpO2: 98%   Filed Weights   09/10/18 1141  Weight: 145 lb 1.6 oz (65.8 kg)   .Body mass index is 19.68 kg/m.  GENERAL:alert, in no acute distress and comfortable SKIN: no acute rashes, no significant lesions EYES: conjunctiva are pink and non-injected, sclera anicteric OROPHARYNX: MMM, no exudates, no oropharyngeal erythema or ulceration NECK: supple, no JVD. Tracheostomy in situ LYMPH:  no palpable lymphadenopathy in the cervical, axillary or inguinal regions LUNGS: clear to auscultation b/l with normal respiratory effort HEART: regular rate & rhythm ABDOMEN:  normoactive bowel sounds , non tender, not distended. PEG tube in situ Extremity: trace pedal edema PSYCH: alert & oriented x 3 with fluent speech NEURO: no focal motor/sensory deficits   LABORATORY DATA:  I have reviewed the data as listed  . CBC Latest Ref Rng & Units 09/10/2018 09/03/2018 08/27/2018  WBC 4.0 - 10.5 K/uL 4.7 5.8 6.5  Hemoglobin 13.0 - 17.0 g/dL 9.6(L) 12.1(L) 12.4(L)  Hematocrit 39.0 - 52.0 % 29.1(L) 37.6(L) 37.9(L)  Platelets 150 - 400 K/uL 115(L) 135(L) 154    . CMP Latest Ref Rng & Units 09/10/2018 09/03/2018 08/27/2018  Glucose 70 - 99 mg/dL  113(H) 79 83  BUN 8 - 23 mg/dL 20 22 21   Creatinine 0.61 - 1.24 mg/dL 0.71 0.77  0.87  Sodium 135 - 145 mmol/L 137 139 140  Potassium 3.5 - 5.1 mmol/L 4.2 4.4 4.2  Chloride 98 - 111 mmol/L 98 102 102  CO2 22 - 32 mmol/L 31 27 29   Calcium 8.9 - 10.3 mg/dL 9.3 9.4 9.7  Total Protein 6.5 - 8.1 g/dL 6.9 7.4 7.8  Total Bilirubin 0.3 - 1.2 mg/dL 0.3 0.3 0.3  Alkaline Phos 38 - 126 U/L 60 70 72  AST 15 - 41 U/L 15 23 19   ALT 0 - 44 U/L 13 20 17      RADIOGRAPHIC STUDIES: I have personally reviewed the radiological images as listed and agreed with the findings in the report. No results found.  ASSESSMENT & PLAN:   63 y.o. male with  1. 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  Discussed with Dr. Benjamine Mola at bedside patient is not a candidate for larynx sparing surgery. He has lost about 40 pounds and is nutritionally quite depleted. Heavy smoking history of 1 to 2 packs/day with COPD /emphysema.  Not on home oxygen. 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 Has tolerated his tracheostomy well and is been able to speak with Passy-Muir valve. Has had issues with swallowing and is still needing tube feeding.  07/17/18 PET/CT revealed he has multiple pulmonary nodules none of them are FDG avid. No FDG avid cervical lymphadenopathy. FDG avid laryngeal tumor noted.  PLAN: -Discussed pt labwork today, 09/10/18; WBC normal at 4.7k, HGB lower at 9.6, PLT slightly lower at 115k. -The pt has no prohibitive toxicities from continuing weekly Carboplatin and Taxol at this time. -Begin Sucralfate, continue Biotene mouthwashes, and continue salt and baking soda mouthwashes -Following with Dr Isidore Moos in [place] -Did connect pt with our nutritional therapist Ernestene Kiel, as well as our Head and Neck Nurse  [place] with absolute smoking cessation -Did set pt up for port placement with IR but patient wanted to hold off -Will see the pt back in 2 weeks   F/u with weekly labs and chemotherapy as scheduled for the next 2 weeks MD visit in 2 weeks   All of the patients questions were answered with apparent satisfaction. The patient knows to call the clinic with any problems, questions or concerns.  The total time spent in the appt was 25 minutes and more than 50% was on counseling and direct patient cares.    Sullivan Lone MD MS AAHIVMS [facility] Hematology/Oncology Physician [facility]  (Office):       [phone] (Work cell):  [phone] (Fax):           [phone]  09/10/2018 11:56 AM  I, Baldwin [place], am acting as a scribe for Dr. Sullivan Lone.   .I have reviewed the above documentation for accuracy and completeness, and I agree with the above. [name] MD

## 2018-09-10 ENCOUNTER — Telehealth: Payer: Self-pay | Admitting: Hematology

## 2018-09-10 ENCOUNTER — Other Ambulatory Visit: Payer: Self-pay

## 2018-09-10 ENCOUNTER — Inpatient Hospital Stay: Payer: BLUE CROSS/BLUE SHIELD

## 2018-09-10 ENCOUNTER — Ambulatory Visit
Admission: RE | Admit: 2018-09-10 | Discharge: 2018-09-10 | Disposition: A | Discharge status: Home / Self Care | Payer: BLUE CROSS/BLUE SHIELD | Source: Ambulatory Visit | Attending: Radiation Oncology | Admitting: Radiation Oncology

## 2018-09-10 ENCOUNTER — Inpatient Hospital Stay (HOSPITAL_BASED_OUTPATIENT_CLINIC_OR_DEPARTMENT_OTHER): Payer: BLUE CROSS/BLUE SHIELD | Admitting: Hematology

## 2018-09-10 VITALS — BP 97/60 | HR 70 | Temp 98.2°F | Resp 17 | Ht 72.0 in | Wt 145.1 lb

## 2018-09-10 DIAGNOSIS — Z5111 Encounter for antineoplastic chemotherapy: Secondary | ICD-10-CM

## 2018-09-10 DIAGNOSIS — E440 Moderate protein-calorie malnutrition: Secondary | ICD-10-CM

## 2018-09-10 DIAGNOSIS — J449 Chronic obstructive pulmonary disease, unspecified: Secondary | ICD-10-CM

## 2018-09-10 DIAGNOSIS — Z87891 Personal history of nicotine dependence: Secondary | ICD-10-CM

## 2018-09-10 DIAGNOSIS — R634 Abnormal weight loss: Secondary | ICD-10-CM

## 2018-09-10 DIAGNOSIS — Z800 Family history of malignant neoplasm of digestive organs: Secondary | ICD-10-CM

## 2018-09-10 DIAGNOSIS — Z7189 Other specified counseling: Secondary | ICD-10-CM

## 2018-09-10 DIAGNOSIS — Z79899 Other long term (current) drug therapy: Secondary | ICD-10-CM

## 2018-09-10 DIAGNOSIS — C320 Malignant neoplasm of glottis: Secondary | ICD-10-CM | POA: Diagnosis not present

## 2018-09-10 DIAGNOSIS — R911 Solitary pulmonary nodule: Secondary | ICD-10-CM | POA: Diagnosis not present

## 2018-09-10 DIAGNOSIS — Z79891 Long term (current) use of opiate analgesic: Secondary | ICD-10-CM

## 2018-09-10 LAB — COMPREHENSIVE METABOLIC PANEL
ALT: 13 U/L (ref 0–44)
AST: 15 U/L (ref 15–41)
Albumin: 2.9 g/dL — ABNORMAL LOW (ref 3.5–5.0)
Alkaline Phosphatase: 60 U/L (ref 38–126)
Anion gap: 8 (ref 5–15)
BUN: 20 mg/dL (ref 8–23)
CO2: 31 mmol/L (ref 22–32)
Calcium: 9.3 mg/dL (ref 8.9–10.3)
Chloride: 98 mmol/L (ref 98–111)
Creatinine, Ser: 0.71 mg/dL (ref 0.61–1.24)
GFR calc Af Amer: >60 mL/min (ref >60)
GFR calc non Af Amer: >60 mL/min (ref >60)
Glucose, Bld: 113 mg/dL — ABNORMAL HIGH (ref 70–99)
Potassium: 4.2 mmol/L (ref 3.5–5.1)
Sodium: 137 mmol/L (ref 135–145)
Total Bilirubin: 0.3 mg/dL (ref 0.3–1.2)
Total Protein: 6.9 g/dL (ref 6.5–8.1)

## 2018-09-10 LAB — CBC WITH DIFFERENTIAL/PLATELET
Abs Immature Granulocytes: 0.02 10*3/uL (ref 0.00–0.07)
Basophils Absolute: 0 10*3/uL (ref 0.0–0.1)
Basophils Relative: 0 %
Eosinophils Absolute: 0.1 10*3/uL (ref 0.0–0.5)
Eosinophils Relative: 3 %
HCT: 29.1 % — ABNORMAL LOW (ref 39.0–52.0)
Hemoglobin: 9.6 g/dL — ABNORMAL LOW (ref 13.0–17.0)
Immature Granulocytes: 0 %
Lymphocytes Relative: 13 %
Lymphs Abs: 0.6 10*3/uL — ABNORMAL LOW (ref 0.7–4.0)
MCH: 32.8 pg (ref 26.0–34.0)
MCHC: 33 g/dL (ref 30.0–36.0)
MCV: 99.3 fL (ref 80.0–100.0)
Monocytes Absolute: 0.6 10*3/uL (ref 0.1–1.0)
Monocytes Relative: 12 %
Neutro Abs: 3.4 10*3/uL (ref 1.7–7.7)
Neutrophils Relative %: 72 %
Platelets: 115 10*3/uL — ABNORMAL LOW (ref 150–400)
RBC: 2.93 MIL/uL — ABNORMAL LOW (ref 4.22–5.81)
RDW: 13.4 % (ref 11.5–15.5)
WBC: 4.7 10*3/uL (ref 4.0–10.5)
nRBC: 0 % (ref 0.0–0.2)

## 2018-09-10 MED ORDER — SODIUM CHLORIDE 0.9 % IV SOLN
50.0000 mg/m2 | Freq: Once | INTRAVENOUS | Status: AC
  Administered 2018-09-10: 90 mg via INTRAVENOUS
  Filled 2018-09-10: qty 15

## 2018-09-10 MED ORDER — DIPHENHYDRAMINE HCL 50 MG/ML IJ SOLN
INTRAMUSCULAR | Status: AC
  Filled 2018-09-10: qty 1

## 2018-09-10 MED ORDER — SODIUM CHLORIDE 0.9 % IV SOLN
20.0000 mg | Freq: Once | INTRAVENOUS | Status: AC
  Administered 2018-09-10: 20 mg via INTRAVENOUS
  Filled 2018-09-10: qty 20

## 2018-09-10 MED ORDER — SODIUM CHLORIDE 0.9 % IV SOLN
20.0000 mg | Freq: Once | INTRAVENOUS | Status: AC
  Administered 2018-09-10: 20 mg via INTRAVENOUS
  Filled 2018-09-10: qty 2

## 2018-09-10 MED ORDER — SODIUM CHLORIDE 0.9 % IV SOLN
226.0000 mg | Freq: Once | INTRAVENOUS | Status: AC
  Administered 2018-09-10: 230 mg via INTRAVENOUS
  Filled 2018-09-10: qty 23

## 2018-09-10 MED ORDER — PALONOSETRON HCL INJECTION 0.25 MG/5ML
INTRAVENOUS | Status: AC
  Filled 2018-09-10: qty 5

## 2018-09-10 MED ORDER — SUCRALFATE 1 GM/10ML PO SUSP: 1.0000 g | Freq: Two times a day (BID) | ORAL | 0 refills | Status: DC

## 2018-09-10 MED ORDER — SODIUM CHLORIDE 0.9 % IV SOLN
Freq: Once | INTRAVENOUS | Status: AC
  Administered 2018-09-10: 13:00:00 via INTRAVENOUS
  Filled 2018-09-10: qty 250

## 2018-09-10 MED ORDER — DIPHENHYDRAMINE HCL 50 MG/ML IJ SOLN
50.0000 mg | Freq: Once | INTRAMUSCULAR | Status: AC
  Administered 2018-09-10: 50 mg via INTRAVENOUS

## 2018-09-10 MED ORDER — PALONOSETRON HCL INJECTION 0.25 MG/5ML
0.2500 mg | Freq: Once | INTRAVENOUS | Status: AC
  Administered 2018-09-10: 0.25 mg via INTRAVENOUS

## 2018-09-10 NOTE — Telephone Encounter (Signed)
Per 4/20 los appt already scheduled.

## 2018-09-10 NOTE — Patient Instructions (Signed)
[facility] Discharge Instructions for Patients Receiving Chemotherapy  Today you received the following chemotherapy agents Paclitaxel (TAXOL) & Carboplatin (PARAPLATIN).  To help prevent nausea and vomiting after your treatment, we encourage you to take your nausea medication as prescribed.   If you develop nausea and vomiting that is not controlled by your nausea medication, call the clinic.   BELOW ARE SYMPTOMS THAT SHOULD BE REPORTED IMMEDIATELY:  *FEVER GREATER THAN 100.5 F  *CHILLS WITH OR WITHOUT FEVER  NAUSEA AND VOMITING THAT IS NOT CONTROLLED WITH YOUR NAUSEA MEDICATION  *UNUSUAL SHORTNESS OF BREATH  *UNUSUAL BRUISING OR BLEEDING  TENDERNESS IN MOUTH AND THROAT WITH OR WITHOUT PRESENCE OF ULCERS  *URINARY PROBLEMS  *BOWEL PROBLEMS  UNUSUAL RASH Items with * indicate a potential emergency and should be followed up as soon as possible.  Feel free to call the clinic should you have any questions or concerns. The clinic phone number is [phone].  Please show the CHEMO ALERT CARD at check-in to the Emergency Department and triage nurse.  Coronavirus (COVID-19) Are you at risk?  Are you at risk for the Coronavirus (COVID-19)?  To be considered HIGH RISK for Coronavirus (COVID-19), you have to meet the following criteria:  . Traveled to [place], [place], [place], [place] or [place]; or in the [place] to [place], [place], [place], or [place]; and have fever, cough, and shortness of breath within the last 2 weeks of travel OR . Been in close contact with a person diagnosed with COVID-19 within the last 2 weeks and have fever, cough, and shortness of breath . IF YOU DO NOT MEET THESE CRITERIA, YOU ARE CONSIDERED LOW RISK FOR COVID-19.  What to do if you are HIGH RISK for COVID-19?  . If you are having a medical emergency, call 911. . Seek medical care right away. Before you go to a doctor's office, urgent care or emergency department,  call ahead and tell them about your recent travel, contact with someone diagnosed with COVID-19, and your symptoms. You should receive instructions from your physician's office regarding next steps of care.  . When you arrive at healthcare provider, tell the healthcare staff immediately you have returned from visiting [place], [place], [place], [place] or [place]; or traveled in the [place] to [place], [place], [place], or [place]; in the last two weeks or you have been in close contact with a person diagnosed with COVID-19 in the last 2 weeks.   . Tell the health care staff about your symptoms: fever, cough and shortness of breath. . After you have been seen by a medical provider, you will be either: o Tested for (COVID-19) and discharged home on quarantine except to seek medical care if symptoms worsen, and asked to  - Stay home and avoid contact with others until you get your results (4-5 days)  - Avoid travel on public transportation if possible (such as bus, train, or airplane) or o Sent to the Emergency Department by EMS for evaluation, COVID-19 testing, and possible admission depending on your condition and test results.  What to do if you are LOW RISK for COVID-19?  Reduce your risk of any infection by using the same precautions used for avoiding the common cold or flu:  . Wash your hands often with soap and warm water for at least 20 seconds.  If soap and water are not readily available, use an alcohol-based hand sanitizer with at least 60% alcohol.  .   If coughing or sneezing, cover your mouth and nose by coughing or sneezing into the elbow areas of your shirt or coat, into a tissue or into your sleeve (not your hands). . Avoid shaking hands with others and consider head nods or verbal greetings only. . Avoid touching your eyes, nose, or mouth with unwashed hands.  . Avoid close contact with people who are sick. . Avoid places or events with large numbers of people in one  location, like concerts or sporting events. . Carefully consider travel plans you have or are making. . If you are planning any travel outside or inside the US, visit the CDC's Travelers' Health webpage for the latest health notices. . If you have some symptoms but not all symptoms, continue to monitor at home and seek medical attention if your symptoms worsen. . If you are having a medical emergency, call 911.   ADDITIONAL HEALTHCARE OPTIONS FOR PATIENTS  [facility] / e-Visit: [email or web address]         [facility]: [phone]  [facility]: [phone]                   [facility]: [phone]   

## 2018-09-11 ENCOUNTER — Other Ambulatory Visit: Payer: Self-pay

## 2018-09-11 ENCOUNTER — Inpatient Hospital Stay: Payer: BLUE CROSS/BLUE SHIELD

## 2018-09-11 ENCOUNTER — Ambulatory Visit
Admission: RE | Admit: 2018-09-11 | Discharge: 2018-09-11 | Disposition: A | Discharge status: Home / Self Care | Payer: BLUE CROSS/BLUE SHIELD | Source: Ambulatory Visit | Attending: Radiation Oncology | Admitting: Radiation Oncology

## 2018-09-11 DIAGNOSIS — C320 Malignant neoplasm of glottis: Secondary | ICD-10-CM | POA: Diagnosis not present

## 2018-09-11 NOTE — Progress Notes (Signed)
Nutrition Follow-up:  RD working remotely.  Patient with cancer of the larynx and receiving concurrent chemotherapy and radiation therapy.  Patient followed by Dr. Irene Limbo and Isidore Moos.  Called patient for nutrition follow-up.  Patient on his way to radiation this am.  Reports that he continues to tolerate 1479ml of osmolite 1.5 daily (6 cartons) and is taking 1137ml of water daily with feeding (flush with feeding and promod) as well as promod 42ml BID (1st and 3rd feeding).    Reports that he is sipping on water at times during the day as well but this amount varies.    Reports that he is having a regular bowel movement about every other day.  No issues with nausea.  Overall doing well with tolerating tube feeding.      Medications: reviewed  Labs: glucose 113  Anthropometrics:   Weight 145 lb 1.6 oz on 4/20 increased from 142 lb 14.4 oz on 4/13.   Estimated Energy Needs  Kcals: 1438-8875 calories Protein: 100-112 g Fluid: 2.3 L  NUTRITION DIAGNOSIS: Unintentional weight loss    INTERVENTION:  Patient asking if he needs to reorder promod from [place].  Instructed him to reorder promod that he will need to continue this to better meet protein needs.  Continue osmolite 1.5, 6 cartons per day with current water flush and promod 50ml BID to meet nutritional needs.   Current tube feeding regimen providing 2330 calories, 109 g protein and 2246 ml free water (not counting oral intake) Patient reports that he has adequate supplies of formula and will call today to reorder promod.   Patient has contact information and will call RD if has questions before follow-up    MONITORING, EVALUATION, GOAL: Patient will tolerate tube feeding plus protein supplement to minimize weight loss   NEXT VISIT: phone f/u on Tuesday, April 28, patient agreeable  Daziya Redmond B. Zenia Resides, [place], [place] Registered Dietitian [phone] (pager)

## 2018-09-12 ENCOUNTER — Ambulatory Visit
Admission: RE | Admit: 2018-09-12 | Discharge: 2018-09-12 | Disposition: A | Discharge status: Home / Self Care | Payer: BLUE CROSS/BLUE SHIELD | Source: Ambulatory Visit | Attending: Radiation Oncology | Admitting: Radiation Oncology

## 2018-09-12 ENCOUNTER — Other Ambulatory Visit: Payer: Self-pay

## 2018-09-12 DIAGNOSIS — C320 Malignant neoplasm of glottis: Secondary | ICD-10-CM | POA: Diagnosis not present

## 2018-09-13 ENCOUNTER — Ambulatory Visit
Admission: RE | Admit: 2018-09-13 | Discharge: 2018-09-13 | Disposition: A | Discharge status: Home / Self Care | Payer: BLUE CROSS/BLUE SHIELD | Source: Ambulatory Visit | Attending: Radiation Oncology | Admitting: Radiation Oncology

## 2018-09-13 ENCOUNTER — Other Ambulatory Visit: Payer: Self-pay

## 2018-09-13 DIAGNOSIS — C320 Malignant neoplasm of glottis: Secondary | ICD-10-CM | POA: Diagnosis not present

## 2018-09-14 ENCOUNTER — Other Ambulatory Visit: Payer: Self-pay

## 2018-09-14 ENCOUNTER — Ambulatory Visit
Admission: RE | Admit: 2018-09-14 | Discharge: 2018-09-14 | Disposition: A | Discharge status: Home / Self Care | Payer: BLUE CROSS/BLUE SHIELD | Source: Ambulatory Visit | Attending: Radiation Oncology | Admitting: Radiation Oncology

## 2018-09-14 DIAGNOSIS — C320 Malignant neoplasm of glottis: Secondary | ICD-10-CM | POA: Diagnosis not present

## 2018-09-14 NOTE — Progress Notes (Signed)
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  The patient was undergoing outpatient evaluation for lung nodules and a vocal cord lesion and presented to the emergency room with worsening dyspnea.  ENT was consulted for his vocal cord mass.  Due to the location of the laryngeal mass, the patient was in danger of developing an upper airway obstruction.  A tracheostomy was placed to protect his airway.  A biopsy of the mass was performed.  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  The patient tells me that he was having persistent upper respiratory symptoms with dyspnea and cough since about November 2019.  He was on several rounds of antibiotics and steroids without significant improvement.  He reports a 64-month history of hoarseness in his voice.  He has lost about 40 pounds over the past 6 months.  He also reports having a sore throat.  Denies difficulty swallowing to admission.  Breathing is better since placement of tracheostomy.  He has not had any fevers or chills.  Denies chest pain.  Denies nausea, vomiting, constipation, diarrhea.  He is on NG tube feeding at this time.  Working with speech therapy on swallowing.  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  The patient is widowed and currently lives alone.  Wife died about 15 months ago secondary to lung cancer. Upon discharge, he plans to go live with 1 of his daughters in [place], [place].  He has 3 daughters who live locally.  1 of  his daughters is an Therapist, sports who works on the oncology unit at [employer].  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 The patient's last visit with [place] was on 09/10/18. The pt reports that he is doing well overall.  The pt reports that he has begun having a sore throat, endorsing constant pain,"not terrible but tough." He has been using the sucralfate. He felt a sensation of SOB when he swallowed lidocaine, and has not used this since. He continues with lidocaine gargles. His voice has also become weaker. He denies fevers or chills. He notes that he is having a little discharge from his PEG site. He notes that he is a little constipated.  The pt continues with his PEG tube for nutrition and endorses a little weight gain.  Lab results today (09/17/18) of CBC w/diff and CMP is as follows: all values are WNL except for WBC at 3.8k, RBC at 3.23, HGB at 10.5, HCT at 32.5, MCV at 100.6, PLT at 138k, Lymphs abs at 500, Albumin at 3.1.  On review of systems, pt reports sore throat, some weight gain, consuming well, increased secretion in throat, clear secretions, stable energy levels, and denies fevers, chills, mouth sores, and any other symptoms.   MEDICAL HISTORY:  Past Medical History:  Diagnosis Date  . Bronchitis 03/2018  . COPD (chronic obstructive pulmonary disease) ([place])   . Herpes   . Lung nodule 2020  . Substance abuse ([place])    opiate  addiction, been on methadone for 11 years    SURGICAL HISTORY: Past Surgical History:  Procedure Laterality Date  . DIRECT LARYNGOSCOPY N/A 07/21/2018   Procedure: DIRECT LARYNGOSCOPY, TRACHEOSTOMY, BIOPSY;  Surgeon: Leta Baptist, MD;  Location: [facility];  Service: ENT;  Laterality: N/A;  . IR GASTROSTOMY TUBE MOD SED  08/02/2018    SOCIAL HISTORY: Social History   Socioeconomic History  . Marital status: Widowed    Spouse name: Not on file  . Number of  children: Not on file  . Years of education: Not on file  . Highest education level: Not on file  Occupational History  . Not on file  Social Needs  . Financial resource strain: Not on file  . Food insecurity:    Worry: Not on file    Inability: Not on file  . Transportation needs:    Medical: No    Non-medical: No  Tobacco Use  . Smoking status: Former Smoker    Packs/day: 2.00    Years: 50.00    Pack years: 100.00    Types: Cigarettes  . Smokeless tobacco: Never Used  . Tobacco comment: 07/06/18 at 10 cigs per day, quit 06/16/18  Substance and Sexual Activity  . Alcohol use: Not Currently  . Drug use: Not Currently    Comment: hx of opiod abuse  . Sexual activity: Not Currently    Birth control/protection: None  Lifestyle  . Physical activity:    Days per week: Not on file    Minutes per session: Not on file  . Stress: Not on file  Relationships  . Social connections:    Talks on phone: Not on file    Gets together: Not on file    Attends religious service: Not on file    Active member of club or organization: Not on file    Attends meetings of clubs or organizations: Not on file    Relationship status: Not on file  . Intimate partner violence:    Fear of current or ex partner: No    Emotionally abused: No    Physically abused: No    Forced sexual activity: No  Other Topics Concern  . Not on file  Social History Narrative  . Not on file    FAMILY HISTORY: Family History  Problem Relation Age of Onset  . Alcohol abuse Father   . Cancer Father   . Cirrhosis Father   . Drug abuse Brother     ALLERGIES:  has No Known Allergies.  MEDICATIONS:  Current Outpatient Medications  Medication Sig Dispense Refill  . albuterol (PROVENTIL HFA;VENTOLIN HFA) 108 (90 Base) MCG/ACT inhaler Inhale 1-2 puffs into the lungs every 6 (six) hours as needed for wheezing.     . budesonide (PULMICORT) 0.5 MG/2ML nebulizer solution Take 2 mLs (0.5 mg total) by nebulization 2  (two) times daily. 2 mL 12  . dexamethasone (DEXAMETHASONE INTENSOL) 1 MG/ML solution Place 8 mLs (8 mg total) into feeding tube daily. Take 8 mg total by mouth daily starting the day after chemotherapy for 2 days 60 mL 3  . formoterol (PERFOROMIST) 20 MCG/2ML nebulizer solution Take 2 mLs (20 mcg total) by nebulization 2 (two) times daily. 2 mL 3  . ibuprofen (ADVIL,MOTRIN) 100 MG/5ML suspension Place 20-30 mLs (400-600 mg total) into feeding tube every 8 (eight) hours as needed for mild pain. 300 mL 0  . ipratropium-albuterol (DUONEB) 0.5-2.5 (3) MG/3ML SOLN Take 3 mLs by nebulization every 6 (six) hours as needed.  360 mL 3  . lidocaine (XYLOCAINE) 2 % solution Patient: Mix 1part 2% viscous lidocaine, 1part H20. Swallow 34mL of diluted mixture, 70min before meals and at bedtime, up to QID 100 mL 5  . lidocaine-prilocaine (EMLA) cream Apply to affected area once 30 g 3  . methadone (DOLOPHINE) 10 MG/ML solution Take 110 mg by mouth daily.    . nicotine (NICODERM CQ - DOSED IN MG/24 HOURS) 21 mg/24hr patch Place 1 patch (21 mg total) onto the skin daily. 28 patch 0  . Nutritional Supplements (FEEDING SUPPLEMENT, OSMOLITE 1.5 CAL,) LIQD Place 350 mLs into feeding tube 4 (four) times daily. 30 Bottle 0  . Nutritional Supplements (PROMOD) LIQD Give 30 mL Promod twice daily via PEG. 2 Bottle 2  . ondansetron (ZOFRAN) 4 MG/5ML solution Place 10 mLs (8 mg total) into feeding tube 2 (two) times daily as needed for nausea or vomiting. Take 8 mg total 2 (two) times daily as needed for refractory nausea / vomiting. Start on day 3 after chemo 100 mL 1  . prochlorperazine (COMPAZINE) 10 MG tablet Take 1 tablet (10 mg total) by mouth every 6 (six) hours as needed (Nausea or vomiting). 30 tablet 1  . prochlorperazine (COMPAZINE) 5 MG tablet Place 1-2 tablets (5-10 mg total) into feeding tube every 6 (six) hours as needed for nausea or vomiting. (Patient not taking: Reported on 08/20/2018) 30 tablet 0  . sucralfate  (CARAFATE) 1 GM/10ML suspension Take 10 mLs (1 g total) by mouth 2 (two) times daily. 420 mL 0  . Water For Irrigation, Sterile (FREE WATER) SOLN Place 300 mLs into feeding tube every 8 (eight) hours. 5000 mL 0   No current facility-administered medications for this visit.     REVIEW OF SYSTEMS:    A 10+ POINT REVIEW OF SYSTEMS WAS OBTAINED including neurology, dermatology, psychiatry, cardiac, respiratory, lymph, extremities, GI, GU, Musculoskeletal, constitutional, breasts, reproductive, HEENT.  All pertinent positives are noted in the HPI.  All others are negative.   PHYSICAL EXAMINATION: ECOG PERFORMANCE STATUS: 1 - Symptomatic but completely ambulatory  Vitals:   09/17/18 1329  BP: 104/84  Pulse: 79  Resp: 18  Temp: 97.6 F (36.4 C)  SpO2: 98%   Filed Weights   09/17/18 1329  Weight: 144 lb 8 oz (65.5 kg)   .Body mass index is 19.6 kg/m.   GENERAL:alert, in no acute distress and comfortable SKIN: no acute rashes, no significant lesions EYES: conjunctiva are pink and non-injected, sclera anicteric OROPHARYNX: MMM, no exudates, no oropharyngeal erythema or ulceration NECK: supple, no JVD. Tracheostomy in situ. LYMPH:  no palpable lymphadenopathy in the cervical, axillary or inguinal regions LUNGS: clear to auscultation b/l with normal respiratory effort HEART: regular rate & rhythm ABDOMEN:  normoactive bowel sounds , non tender, not distended. No palpable hepatosplenomegaly.  PEG tube in situ. Extremity: trace pedal edema PSYCH: alert & oriented x 3 with fluent speech NEURO: no focal motor/sensory deficits   LABORATORY DATA:  I have reviewed the data as listed  . CBC Latest Ref Rng & Units 09/17/2018 09/10/2018 09/03/2018  WBC 4.0 - 10.5 K/uL 3.8(L) 4.7 5.8  Hemoglobin 13.0 - 17.0 g/dL 10.5(L) 9.6(L) 12.1(L)  Hematocrit 39.0 - 52.0 % 32.5(L) 29.1(L) 37.6(L)  Platelets 150 - 400 K/uL 138(L) 115(L) 135(L)    . CMP Latest Ref Rng & Units 09/17/2018 09/10/2018  09/03/2018  Glucose 70 - 99 mg/dL 73 113(H) 79  BUN 8 - 23 mg/dL 18 20 22   Creatinine 0.61 -  1.24 mg/dL 0.78 0.71 0.77  Sodium 135 - 145 mmol/L 138 137 139  Potassium 3.5 - 5.1 mmol/L 4.3 4.2 4.4  Chloride 98 - 111 mmol/L 99 98 102  CO2 22 - 32 mmol/L 29 31 27   Calcium 8.9 - 10.3 mg/dL 9.2 9.3 9.4  Total Protein 6.5 - 8.1 g/dL 7.1 6.9 7.4  Total Bilirubin 0.3 - 1.2 mg/dL 0.3 0.3 0.3  Alkaline Phos 38 - 126 U/L 66 60 70  AST 15 - 41 U/L 20 15 23   ALT 0 - 44 U/L 16 13 20      RADIOGRAPHIC STUDIES: I have personally reviewed the radiological images as listed and agreed with the findings in the report. No results found.  ASSESSMENT & PLAN:   63 y.o. male with  1. 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  Discussed with Dr. Benjamine Mola at bedside patient is not a candidate for larynx sparing surgery. He has lost about 40 pounds and is nutritionally quite depleted. Heavy smoking history of 1 to 2 packs/day with COPD /emphysema.  Not on home oxygen. 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 Has tolerated his tracheostomy well and is been able to speak with Passy-Muir valve. Has had issues with swallowing and is still needing tube feeding.  07/17/18 PET/CT revealed he has multiple pulmonary nodules none of them are FDG avid. No FDG avid cervical lymphadenopathy. FDG avid laryngeal tumor noted.  PLAN: -Discussed pt labwork today, 09/17/18; blood counts and chemistries are stable, no neutropenia. -The pt has no prohibitive toxicities from continuing weekly Carboplatin and Taxol at this time. -Continue with Sucralfate, Biotene, gargling Lidocaine, and salt and baking soda mouthwashes  -Following with Dr Isidore Moos in [place] -Did connect pt with our nutritional therapist Ernestene Kiel, as well as our Head  and Neck Nurse [place] with absolute smoking cessation -Did set pt up for port placement with IR but patient wanted to hold off -Will see the pt back in one week   - f/u as scheduled for weekly chemotherapy - next 2 cycles as scheduled with labs and MD visit   All of the patients questions were answered with apparent satisfaction. The patient knows to call the clinic with any problems, questions or concerns.  The total time spent in the appt was 20 minutes and more than 50% was on counseling and direct patient cares.    Sullivan Lone MD MS AAHIVMS [facility] Hematology/Oncology Physician [facility]  (Office):       [phone] (Work cell):  [phone] (Fax):           [phone]  09/17/2018 2:00 PM  I, Baldwin [place], am acting as a scribe for Dr. Sullivan Lone.   .I have reviewed the above documentation for accuracy and completeness, and I agree with the above. [name] MD

## 2018-09-17 ENCOUNTER — Telehealth: Payer: Self-pay | Admitting: Hematology

## 2018-09-17 ENCOUNTER — Inpatient Hospital Stay: Payer: BLUE CROSS/BLUE SHIELD

## 2018-09-17 ENCOUNTER — Ambulatory Visit
Admission: RE | Admit: 2018-09-17 | Discharge: 2018-09-17 | Disposition: A | Discharge status: Home / Self Care | Payer: BLUE CROSS/BLUE SHIELD | Source: Ambulatory Visit | Attending: Radiation Oncology | Admitting: Radiation Oncology

## 2018-09-17 ENCOUNTER — Inpatient Hospital Stay (HOSPITAL_BASED_OUTPATIENT_CLINIC_OR_DEPARTMENT_OTHER): Payer: BLUE CROSS/BLUE SHIELD | Admitting: Hematology

## 2018-09-17 ENCOUNTER — Other Ambulatory Visit: Payer: Self-pay

## 2018-09-17 VITALS — BP 104/84 | HR 79 | Temp 97.6°F | Resp 18 | Ht 72.0 in | Wt 144.5 lb

## 2018-09-17 DIAGNOSIS — F1111 Opioid abuse, in remission: Secondary | ICD-10-CM

## 2018-09-17 DIAGNOSIS — R634 Abnormal weight loss: Secondary | ICD-10-CM

## 2018-09-17 DIAGNOSIS — Z5111 Encounter for antineoplastic chemotherapy: Secondary | ICD-10-CM

## 2018-09-17 DIAGNOSIS — C320 Malignant neoplasm of glottis: Secondary | ICD-10-CM

## 2018-09-17 DIAGNOSIS — Z79899 Other long term (current) drug therapy: Secondary | ICD-10-CM

## 2018-09-17 DIAGNOSIS — R911 Solitary pulmonary nodule: Secondary | ICD-10-CM

## 2018-09-17 DIAGNOSIS — K5900 Constipation, unspecified: Secondary | ICD-10-CM | POA: Diagnosis not present

## 2018-09-17 DIAGNOSIS — Z7189 Other specified counseling: Secondary | ICD-10-CM

## 2018-09-17 DIAGNOSIS — Z800 Family history of malignant neoplasm of digestive organs: Secondary | ICD-10-CM

## 2018-09-17 DIAGNOSIS — J449 Chronic obstructive pulmonary disease, unspecified: Secondary | ICD-10-CM

## 2018-09-17 DIAGNOSIS — Z79891 Long term (current) use of opiate analgesic: Secondary | ICD-10-CM

## 2018-09-17 DIAGNOSIS — Z87891 Personal history of nicotine dependence: Secondary | ICD-10-CM

## 2018-09-17 LAB — COMPREHENSIVE METABOLIC PANEL
ALT: 16 U/L (ref 0–44)
AST: 20 U/L (ref 15–41)
Albumin: 3.1 g/dL — ABNORMAL LOW (ref 3.5–5.0)
Alkaline Phosphatase: 66 U/L (ref 38–126)
Anion gap: 10 (ref 5–15)
BUN: 18 mg/dL (ref 8–23)
CO2: 29 mmol/L (ref 22–32)
Calcium: 9.2 mg/dL (ref 8.9–10.3)
Chloride: 99 mmol/L (ref 98–111)
Creatinine, Ser: 0.78 mg/dL (ref 0.61–1.24)
GFR calc Af Amer: >60 mL/min (ref >60)
GFR calc non Af Amer: >60 mL/min (ref >60)
Glucose, Bld: 73 mg/dL (ref 70–99)
Potassium: 4.3 mmol/L (ref 3.5–5.1)
Sodium: 138 mmol/L (ref 135–145)
Total Bilirubin: 0.3 mg/dL (ref 0.3–1.2)
Total Protein: 7.1 g/dL (ref 6.5–8.1)

## 2018-09-17 LAB — CBC WITH DIFFERENTIAL/PLATELET
Abs Immature Granulocytes: 0.01 10*3/uL (ref 0.00–0.07)
Basophils Absolute: 0 10*3/uL (ref 0.0–0.1)
Basophils Relative: 1 %
Eosinophils Absolute: 0.1 10*3/uL (ref 0.0–0.5)
Eosinophils Relative: 2 %
HCT: 32.5 % — ABNORMAL LOW (ref 39.0–52.0)
Hemoglobin: 10.5 g/dL — ABNORMAL LOW (ref 13.0–17.0)
Immature Granulocytes: 0 %
Lymphocytes Relative: 14 %
Lymphs Abs: 0.5 10*3/uL — ABNORMAL LOW (ref 0.7–4.0)
MCH: 32.5 pg (ref 26.0–34.0)
MCHC: 32.3 g/dL (ref 30.0–36.0)
MCV: 100.6 fL — ABNORMAL HIGH (ref 80.0–100.0)
Monocytes Absolute: 0.4 10*3/uL (ref 0.1–1.0)
Monocytes Relative: 10 %
Neutro Abs: 2.8 10*3/uL (ref 1.7–7.7)
Neutrophils Relative %: 73 %
Platelets: 138 10*3/uL — ABNORMAL LOW (ref 150–400)
RBC: 3.23 MIL/uL — ABNORMAL LOW (ref 4.22–5.81)
RDW: 13.9 % (ref 11.5–15.5)
WBC: 3.8 10*3/uL — ABNORMAL LOW (ref 4.0–10.5)
nRBC: 0 % (ref 0.0–0.2)

## 2018-09-17 MED ORDER — DIPHENHYDRAMINE HCL 50 MG/ML IJ SOLN
50.0000 mg | Freq: Once | INTRAMUSCULAR | Status: AC
  Administered 2018-09-17: 50 mg via INTRAVENOUS

## 2018-09-17 MED ORDER — FAMOTIDINE IN NACL 20-0.9 MG/50ML-% IV SOLN
INTRAVENOUS | Status: AC
  Filled 2018-09-17: qty 50

## 2018-09-17 MED ORDER — DIPHENHYDRAMINE HCL 50 MG/ML IJ SOLN
INTRAMUSCULAR | Status: AC
  Filled 2018-09-17: qty 1

## 2018-09-17 MED ORDER — SODIUM CHLORIDE 0.9 % IV SOLN
20.0000 mg | Freq: Once | INTRAVENOUS | Status: AC
  Administered 2018-09-17: 15:00:00 20 mg via INTRAVENOUS
  Filled 2018-09-17: qty 20

## 2018-09-17 MED ORDER — PALONOSETRON HCL INJECTION 0.25 MG/5ML
0.2500 mg | Freq: Once | INTRAVENOUS | Status: AC
  Administered 2018-09-17: 0.25 mg via INTRAVENOUS

## 2018-09-17 MED ORDER — SODIUM CHLORIDE 0.9 % IV SOLN
50.0000 mg/m2 | Freq: Once | INTRAVENOUS | Status: AC
  Administered 2018-09-17: 16:00:00 90 mg via INTRAVENOUS
  Filled 2018-09-17: qty 15

## 2018-09-17 MED ORDER — SODIUM CHLORIDE 0.9 % IV SOLN
Freq: Once | INTRAVENOUS | Status: AC
  Administered 2018-09-17: 15:00:00 via INTRAVENOUS
  Filled 2018-09-17: qty 250

## 2018-09-17 MED ORDER — PALONOSETRON HCL INJECTION 0.25 MG/5ML
INTRAVENOUS | Status: AC
  Filled 2018-09-17: qty 5

## 2018-09-17 MED ORDER — SODIUM CHLORIDE 0.9 % IV SOLN: 20.0000 mg | Freq: Once | INTRAVENOUS | Status: DC

## 2018-09-17 MED ORDER — SODIUM CHLORIDE 0.9 % IV SOLN
226.0000 mg | Freq: Once | INTRAVENOUS | Status: AC
  Administered 2018-09-17: 17:00:00 230 mg via INTRAVENOUS
  Filled 2018-09-17: qty 23

## 2018-09-17 MED ORDER — FAMOTIDINE IN NACL 20-0.9 MG/50ML-% IV SOLN
20.0000 mg | Freq: Once | INTRAVENOUS | Status: AC
  Administered 2018-09-17: 20 mg via INTRAVENOUS

## 2018-09-17 NOTE — Patient Instructions (Signed)
[facility] Discharge Instructions for Patients Receiving Chemotherapy  Today you received the following chemotherapy agents Taxol/Carboplatin  To help prevent nausea and vomiting after your treatment, we encourage you to take your nausea medication: As directed by MD   If you develop nausea and vomiting that is not controlled by your nausea medication, call the clinic.   BELOW ARE SYMPTOMS THAT SHOULD BE REPORTED IMMEDIATELY:  *FEVER GREATER THAN 100.5 F  *CHILLS WITH OR WITHOUT FEVER  NAUSEA AND VOMITING THAT IS NOT CONTROLLED WITH YOUR NAUSEA MEDICATION  *UNUSUAL SHORTNESS OF BREATH  *UNUSUAL BRUISING OR BLEEDING  TENDERNESS IN MOUTH AND THROAT WITH OR WITHOUT PRESENCE OF ULCERS  *URINARY PROBLEMS  *BOWEL PROBLEMS  UNUSUAL RASH Items with * indicate a potential emergency and should be followed up as soon as possible.  Feel free to call the clinic should you have any questions or concerns. The clinic phone number is [phone].  Please show the [place] at check-in to the Emergency Department and triage nurse.

## 2018-09-17 NOTE — Progress Notes (Signed)
Pt. Received 50mg  Benadryl IV prior to chemo treatment, pt. very lethargic.  Sent staff message to MD to consider lowering Benadryl for next treatment.

## 2018-09-17 NOTE — Telephone Encounter (Signed)
Per 4/27 los appts already scheduled.

## 2018-09-18 ENCOUNTER — Ambulatory Visit
Admission: RE | Admit: 2018-09-18 | Discharge: 2018-09-18 | Disposition: A | Discharge status: Home / Self Care | Payer: BLUE CROSS/BLUE SHIELD | Source: Ambulatory Visit | Attending: Radiation Oncology | Admitting: Radiation Oncology

## 2018-09-18 ENCOUNTER — Inpatient Hospital Stay: Payer: BLUE CROSS/BLUE SHIELD

## 2018-09-18 ENCOUNTER — Other Ambulatory Visit: Payer: Self-pay

## 2018-09-18 DIAGNOSIS — C320 Malignant neoplasm of glottis: Secondary | ICD-10-CM | POA: Diagnosis not present

## 2018-09-18 NOTE — Progress Notes (Signed)
Nutrition Follow-up:  RD working remotely.  Patient with cancer of the larynx and receiving chemotherapy and radiation therapy.  Patient followed by Dr. Irene Limbo and Isidore Moos.   Called patient for nutrition follow-up. Patient reports sore throat and noticed hoarseness.  Reports that he is continuing 1467ml of osmolite 1.5 daily (6 cartons) and is taking 1112ml water daily with feeding (flush with feeding and promod) as well as promod (1st and 3rd feeding).    No issues with nausea.  Reports bowels are moving about every other day.      Medications: reviewed  Labs: reviewed  Anthropometrics:   Weight noted on 4/27 144 lb 8 oz stable from last week of 145 lb 1.6 oz.     Estimated Energy Needs  Kcals: 6144-3154 calories Protein: 100-112 g Fluid: 2.3 L  NUTRITION DIAGNOSIS: Unintentional weight loss   INTERVENTION:  Continue 6 cartons of osmolite 1.5 with current water flush and promod 52ml BID.  Patient reports has actually been giving 1 1/2 oz promod BID.  Will go back to 1 oz BID (79ml).   Current tube feeding providing 2330 calories, 109 g protein and 2246 ml free water.       MONITORING, EVALUATION, GOAL: Patient will tolerate tube feeding and promod to minimize weight loss   NEXT VISIT: phone f/u Tuesday, May 5th   Ronald Ruiz, [place], [place] Registered Dietitian [phone] (pager)

## 2018-09-19 ENCOUNTER — Encounter: Payer: Self-pay | Admitting: Hematology

## 2018-09-19 ENCOUNTER — Ambulatory Visit
Admission: RE | Admit: 2018-09-19 | Discharge: 2018-09-19 | Disposition: A | Discharge status: Home / Self Care | Payer: BLUE CROSS/BLUE SHIELD | Source: Ambulatory Visit | Attending: Radiation Oncology | Admitting: Radiation Oncology

## 2018-09-19 ENCOUNTER — Other Ambulatory Visit: Payer: Self-pay

## 2018-09-19 DIAGNOSIS — C320 Malignant neoplasm of glottis: Secondary | ICD-10-CM | POA: Diagnosis not present

## 2018-09-19 NOTE — Progress Notes (Signed)
Pt is approved for the $700 [facility] grant.  °

## 2018-09-20 ENCOUNTER — Other Ambulatory Visit: Payer: Self-pay | Admitting: Hematology

## 2018-09-20 ENCOUNTER — Ambulatory Visit
Admission: RE | Admit: 2018-09-20 | Discharge: 2018-09-20 | Disposition: A | Discharge status: Home / Self Care | Payer: BLUE CROSS/BLUE SHIELD | Source: Ambulatory Visit | Attending: Radiation Oncology | Admitting: Radiation Oncology

## 2018-09-20 ENCOUNTER — Other Ambulatory Visit: Payer: Self-pay

## 2018-09-20 DIAGNOSIS — C320 Malignant neoplasm of glottis: Secondary | ICD-10-CM | POA: Diagnosis not present

## 2018-09-21 ENCOUNTER — Other Ambulatory Visit: Payer: Self-pay

## 2018-09-21 ENCOUNTER — Ambulatory Visit
Admission: RE | Admit: 2018-09-21 | Discharge: 2018-09-21 | Disposition: A | Discharge status: Home / Self Care | Payer: BC Managed Care – PPO | Source: Ambulatory Visit | Attending: Radiation Oncology | Admitting: Radiation Oncology

## 2018-09-21 DIAGNOSIS — C320 Malignant neoplasm of glottis: Secondary | ICD-10-CM | POA: Insufficient documentation

## 2018-09-21 NOTE — Progress Notes (Signed)
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  The patient was undergoing outpatient evaluation for lung nodules and a vocal cord lesion and presented to the emergency room with worsening dyspnea.  ENT was consulted for his vocal cord mass.  Due to the location of the laryngeal mass, the patient was in danger of developing an upper airway obstruction.  A tracheostomy was placed to protect his airway.  A biopsy of the mass was performed.  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  The patient tells me that he was having persistent upper respiratory symptoms with dyspnea and cough since about November 2019.  He was on several rounds of antibiotics and steroids without significant improvement.  He reports a 23-month history of hoarseness in his voice.  He has lost about 40 pounds over the past 6 months.  He also reports having a sore throat.  Denies difficulty swallowing to admission.  Breathing is better since placement of tracheostomy.  He has not had any fevers or chills.  Denies chest pain.  Denies nausea, vomiting, constipation, diarrhea.  He is on NG tube feeding at this time.  Working with speech therapy on swallowing.  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  The patient is widowed and currently lives alone.  Wife died about 15 months ago secondary to lung cancer. Upon discharge, he plans to go live with 1 of his daughters in [place], [place].  He has 3 daughters who live locally.  1 of  his daughters is an Therapist, sports who works on the oncology unit at [employer].  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 The patient's last visit with [place] was on 09/17/18.  The pt reports that he has not felt very well in the last few days, endorsing a worsened sore throat. He is using sucralfate and lidocaine. He notes that his energy levels are slightly lower. He notes that he has not felt hungry and has lost 5 pounds in the last week. He denies any concerns for infections.  The pt notes that he has been taking 30-40 mg more of his methodone, due to worsened pain, he takes a baseline of 100mg . He notes that he has not spoken with his Methadone managing clinic regarding his worsened pain.  Lab results today (09/24/18) of CBC w/diff and CMP is as follows: all values are WNL except for WBC at 2.7k, RBC at 3.27, HGB at 10.7, HCT at 32.8, MCV at 100.3, PLT at 122k, Lymphs abs at 400, Glucose at 117, Albumin at 3.0.  On review of systems, pt reports sore throat, weight loss, eating less, lower energy levels, and denies concerns for infections, abdominal pains, leg swelling, and any other symptoms.   MEDICAL HISTORY:  Past Medical History:  Diagnosis Date   Bronchitis 03/2018   COPD (chronic obstructive pulmonary disease) (HCC)    Herpes    Lung nodule 2020   Substance abuse ([place])    opiate addiction, been on  methadone for 11 years    SURGICAL HISTORY: Past Surgical History:  Procedure Laterality Date   DIRECT LARYNGOSCOPY N/A 07/21/2018   Procedure: DIRECT LARYNGOSCOPY, TRACHEOSTOMY, BIOPSY;  Surgeon: Leta Baptist, MD;  Location: [facility];  Service: ENT;  Laterality: N/A;   IR GASTROSTOMY TUBE MOD SED  08/02/2018    SOCIAL HISTORY: Social History   Socioeconomic History   Marital status: Widowed    Spouse name: Not on file   Number of children: Not on file   Years of education: Not on  file   Highest education level: Not on file  Occupational History   Not on file  Social Needs   Financial resource strain: Not on file   Food insecurity:    Worry: Not on file    Inability: Not on file   Transportation needs:    Medical: No    Non-medical: No  Tobacco Use   Smoking status: Former Smoker    Packs/day: 2.00    Years: 50.00    Pack years: 100.00    Types: Cigarettes   Smokeless tobacco: Never Used   Tobacco comment: 07/06/18 at 10 cigs per day, quit 06/16/18  Substance and Sexual Activity   Alcohol use: Not Currently   Drug use: Not Currently    Comment: hx of opiod abuse   Sexual activity: Not Currently    Birth control/protection: None  Lifestyle   Physical activity:    Days per week: Not on file    Minutes per session: Not on file   Stress: Not on file  Relationships   Social connections:    Talks on phone: Not on file    Gets together: Not on file    Attends religious service: Not on file    Active member of club or organization: Not on file    Attends meetings of clubs or organizations: Not on file    Relationship status: Not on file   Intimate partner violence:    Fear of current or ex partner: No    Emotionally abused: No    Physically abused: No    Forced sexual activity: No  Other Topics Concern   Not on file  Social History Narrative   Not on file    FAMILY HISTORY: Family History  Problem Relation Age of Onset   Alcohol abuse Father    Cancer Father    Cirrhosis Father    Drug abuse Brother     ALLERGIES:  has No Known Allergies.  MEDICATIONS:  Current Outpatient Medications  Medication Sig Dispense Refill   albuterol (PROVENTIL HFA;VENTOLIN HFA) 108 (90 Base) MCG/ACT inhaler Inhale 1-2 puffs into the lungs every 6 (six) hours as needed for wheezing.      budesonide (PULMICORT) 0.5 MG/2ML nebulizer solution Take 2 mLs (0.5 mg total) by nebulization 2 (two) times daily. 2 mL 12   dexamethasone  (DEXAMETHASONE INTENSOL) 1 MG/ML solution Place 8 mLs (8 mg total) into feeding tube daily. Take 8 mg total by mouth daily starting the day after chemotherapy for 2 days 60 mL 3   formoterol (PERFOROMIST) 20 MCG/2ML nebulizer solution Take 2 mLs (20 mcg total) by nebulization 2 (two) times daily. 2 mL 3   ibuprofen (ADVIL,MOTRIN) 100 MG/5ML suspension Place 20-30 mLs (400-600 mg total) into feeding tube every 8 (eight) hours as needed for mild pain. 300 mL 0   ipratropium-albuterol (DUONEB) 0.5-2.5 (3) MG/3ML SOLN Take 3 mLs by nebulization every 6 (six) hours as needed. 360 mL 3  lidocaine (XYLOCAINE) 2 % solution Patient: Mix 1part 2% viscous lidocaine, 1part H20. Swallow 29mL of diluted mixture, 52min before meals and at bedtime, up to QID 100 mL 5   lidocaine-prilocaine (EMLA) cream Apply to affected area once 30 g 3   methadone (DOLOPHINE) 10 MG/ML solution Take 110 mg by mouth daily.     nicotine (NICODERM CQ - DOSED IN MG/24 HOURS) 21 mg/24hr patch Place 1 patch (21 mg total) onto the skin daily. 28 patch 0   Nutritional Supplements (FEEDING SUPPLEMENT, OSMOLITE 1.5 CAL,) LIQD Place 350 mLs into feeding tube 4 (four) times daily. 30 Bottle 0   Nutritional Supplements (PROMOD) LIQD Give 30 mL Promod twice daily via PEG. 2 Bottle 2   ondansetron (ZOFRAN) 4 MG/5ML solution Place 10 mLs (8 mg total) into feeding tube 2 (two) times daily as needed for nausea or vomiting. Take 8 mg total 2 (two) times daily as needed for refractory nausea / vomiting. Start on day 3 after chemo 100 mL 1   prochlorperazine (COMPAZINE) 10 MG tablet Take 1 tablet (10 mg total) by mouth every 6 (six) hours as needed (Nausea or vomiting). 30 tablet 1   prochlorperazine (COMPAZINE) 5 MG tablet Place 1-2 tablets (5-10 mg total) into feeding tube every 6 (six) hours as needed for nausea or vomiting. (Patient not taking: Reported on 08/20/2018) 30 tablet 0   sucralfate (CARAFATE) 1 GM/10ML suspension TAKE 10 MLS (1  G TOTAL) BY MOUTH 2 (TWO) TIMES DAILY. 420 mL 0   Water For Irrigation, Sterile (FREE WATER) SOLN Place 300 mLs into feeding tube every 8 (eight) hours. 5000 mL 0   Current Facility-Administered Medications  Medication Dose Route Frequency Provider Last Rate Last Dose   0.9 %  sodium chloride infusion   Intravenous Continuous Irene Limbo, Cloria Spring, MD        REVIEW OF SYSTEMS:    A 10+ POINT REVIEW OF SYSTEMS WAS OBTAINED including neurology, dermatology, psychiatry, cardiac, respiratory, lymph, extremities, GI, GU, Musculoskeletal, constitutional, breasts, reproductive, HEENT.  All pertinent positives are noted in the HPI.  All others are negative.   PHYSICAL EXAMINATION: ECOG PERFORMANCE STATUS: 1 - Symptomatic but completely ambulatory  Vitals:   09/24/18 1205  BP: (!) 105/56  Pulse: 76  Resp: 18  Temp: 98.1 F (36.7 C)  SpO2: 100%   Filed Weights   09/24/18 1205  Weight: 139 lb 9.6 oz (63.3 kg)   .Body mass index is 18.93 kg/m.  GENERAL:alert, in no acute distress and comfortable SKIN: no acute rashes, no significant lesions EYES: conjunctiva are pink and non-injected, sclera anicteric OROPHARYNX: MMM, no exudates, no oropharyngeal erythema or ulceration NECK: supple, no JVD. Tracheostomy in situ. LYMPH:  no palpable lymphadenopathy in the cervical, axillary or inguinal regions LUNGS: clear to auscultation b/l with normal respiratory effort HEART: regular rate & rhythm ABDOMEN:  normoactive bowel sounds , non tender, not distended. No palpable hepatosplenomegaly. PEG tube in situ. Extremity: trace pedal edema PSYCH: alert & oriented x 3 with fluent speech NEURO: no focal motor/sensory deficits   LABORATORY DATA:  I have reviewed the data as listed  . CBC Latest Ref Rng & Units 09/24/2018 09/17/2018 09/10/2018  WBC 4.0 - 10.5 K/uL 2.7(L) 3.8(L) 4.7  Hemoglobin 13.0 - 17.0 g/dL 10.7(L) 10.5(L) 9.6(L)  Hematocrit 39.0 - 52.0 % 32.8(L) 32.5(L) 29.1(L)  Platelets 150 -  400 K/uL 122(L) 138(L) 115(L)    . CMP Latest Ref Rng & Units 09/24/2018 09/17/2018 09/10/2018  Glucose 70 -  99 mg/dL 117(H) 73 113(H)  BUN 8 - 23 mg/dL 17 18 20   Creatinine 0.61 - 1.24 mg/dL 0.79 0.78 0.71  Sodium 135 - 145 mmol/L 138 138 137  Potassium 3.5 - 5.1 mmol/L 3.9 4.3 4.2  Chloride 98 - 111 mmol/L 99 99 98  CO2 22 - 32 mmol/L 31 29 31   Calcium 8.9 - 10.3 mg/dL 9.0 9.2 9.3  Total Protein 6.5 - 8.1 g/dL 7.1 7.1 6.9  Total Bilirubin 0.3 - 1.2 mg/dL 0.3 0.3 0.3  Alkaline Phos 38 - 126 U/L 65 66 60  AST 15 - 41 U/L 18 20 15   ALT 0 - 44 U/L 14 16 13      RADIOGRAPHIC STUDIES: I have personally reviewed the radiological images as listed and agreed with the findings in the report. No results found.  ASSESSMENT & PLAN:   63 y.o. male with  1. 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  Discussed with Dr. Benjamine Mola at bedside patient is not a candidate for larynx sparing surgery. He has lost about 40 pounds and is nutritionally quite depleted. Heavy smoking history of 1 to 2 packs/day with COPD /emphysema.  Not on home oxygen. 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 Has tolerated his tracheostomy well and is been able to speak with Passy-Muir valve. Has had issues with swallowing and is still needing tube feeding.  07/17/18 PET/CT revealed he has multiple pulmonary nodules none of them are FDG avid. No FDG avid cervical lymphadenopathy. FDG avid laryngeal tumor noted.  PLAN: -Discussed pt labwork today, 09/24/18; mild leukopenia without nuetropenia, mild anemia, mild thrombocytopenia -The pt has no prohibitive toxicities from continuing D29 of his weekly Carboplatin and Taxol at this time. -Discussed that we may need to hold the last infusion of Carboplatin and Taxol scheduled for next  week if leukopenia worsens. Will monitor.  -Will had extra IVF today -Advised  Increased dose and splitting his daily dose of Methodone to 3 times each day, for smoother pain control. Advised speaking with his Methodone management clinic for adjustments if necessary. Pt notes he will do this. I offered to all his physician and patient refused this. -Continue Sucralfate, Biotene, Lidocaine, and salt and baking soda mouthwashes -Follow up with nutritional therapist Ernestene Kiel as well as our head and neck nurse navigator Gayleen Orem -Following with Dr Isidore Moos in [place] with absolute smoking cessation -Did set pt up for port placement with IR but patient wanted to hold off -Will see the pt back in one week   Please schedule for 2 Liters of IVF on Thursday 09/27/2018 RTC in 1 week for 6th cycle of chemo/labs and MD as scheduled.   All of the patients questions were answered with apparent satisfaction. The patient knows to call the clinic with any problems, questions or concerns.  The total time spent in the appt was 25 minutes and more than 50% was on counseling and direct patient cares.    Sullivan Lone MD MS AAHIVMS [facility] Hematology/Oncology Physician [facility] [place]  (Office):       [phone] (Work cell):  [phone] (Fax):           [phone]  09/24/2018 12:59 PM  I, Baldwin Jamaica, am acting as a scribe for Dr. Sullivan Lone.   .I have reviewed the  above documentation for accuracy and completeness, and I agree with the above. [name] MD

## 2018-09-24 ENCOUNTER — Inpatient Hospital Stay: Payer: BC Managed Care – PPO | Attending: Hematology

## 2018-09-24 ENCOUNTER — Inpatient Hospital Stay (HOSPITAL_BASED_OUTPATIENT_CLINIC_OR_DEPARTMENT_OTHER): Payer: BC Managed Care – PPO | Admitting: Hematology

## 2018-09-24 ENCOUNTER — Telehealth: Payer: Self-pay | Admitting: Hematology

## 2018-09-24 ENCOUNTER — Other Ambulatory Visit: Payer: Self-pay | Admitting: Hematology

## 2018-09-24 ENCOUNTER — Ambulatory Visit
Admission: RE | Admit: 2018-09-24 | Discharge: 2018-09-24 | Disposition: A | Discharge status: Home / Self Care | Payer: BC Managed Care – PPO | Source: Ambulatory Visit | Attending: Radiation Oncology | Admitting: Radiation Oncology

## 2018-09-24 ENCOUNTER — Other Ambulatory Visit: Payer: Self-pay

## 2018-09-24 ENCOUNTER — Inpatient Hospital Stay: Payer: BC Managed Care – PPO

## 2018-09-24 VITALS — BP 105/56 | HR 76 | Temp 98.1°F | Resp 18 | Ht 72.0 in | Wt 139.6 lb

## 2018-09-24 DIAGNOSIS — J029 Acute pharyngitis, unspecified: Secondary | ICD-10-CM | POA: Insufficient documentation

## 2018-09-24 DIAGNOSIS — B009 Herpesviral infection, unspecified: Secondary | ICD-10-CM | POA: Insufficient documentation

## 2018-09-24 DIAGNOSIS — R911 Solitary pulmonary nodule: Secondary | ICD-10-CM

## 2018-09-24 DIAGNOSIS — D72819 Decreased white blood cell count, unspecified: Secondary | ICD-10-CM

## 2018-09-24 DIAGNOSIS — R634 Abnormal weight loss: Secondary | ICD-10-CM | POA: Insufficient documentation

## 2018-09-24 DIAGNOSIS — Z87891 Personal history of nicotine dependence: Secondary | ICD-10-CM | POA: Insufficient documentation

## 2018-09-24 DIAGNOSIS — C320 Malignant neoplasm of glottis: Secondary | ICD-10-CM

## 2018-09-24 DIAGNOSIS — F1111 Opioid abuse, in remission: Secondary | ICD-10-CM | POA: Insufficient documentation

## 2018-09-24 DIAGNOSIS — Z79899 Other long term (current) drug therapy: Secondary | ICD-10-CM

## 2018-09-24 DIAGNOSIS — D649 Anemia, unspecified: Secondary | ICD-10-CM | POA: Diagnosis not present

## 2018-09-24 DIAGNOSIS — J449 Chronic obstructive pulmonary disease, unspecified: Secondary | ICD-10-CM | POA: Insufficient documentation

## 2018-09-24 DIAGNOSIS — R197 Diarrhea, unspecified: Secondary | ICD-10-CM | POA: Insufficient documentation

## 2018-09-24 DIAGNOSIS — R110 Nausea: Secondary | ICD-10-CM | POA: Insufficient documentation

## 2018-09-24 DIAGNOSIS — Z5111 Encounter for antineoplastic chemotherapy: Secondary | ICD-10-CM | POA: Diagnosis not present

## 2018-09-24 DIAGNOSIS — Z7189 Other specified counseling: Secondary | ICD-10-CM

## 2018-09-24 DIAGNOSIS — D696 Thrombocytopenia, unspecified: Secondary | ICD-10-CM

## 2018-09-24 DIAGNOSIS — Z930 Tracheostomy status: Secondary | ICD-10-CM

## 2018-09-24 DIAGNOSIS — E860 Dehydration: Secondary | ICD-10-CM

## 2018-09-24 DIAGNOSIS — I700 Atherosclerosis of aorta: Secondary | ICD-10-CM | POA: Insufficient documentation

## 2018-09-24 LAB — CBC WITH DIFFERENTIAL/PLATELET
Abs Immature Granulocytes: 0.01 10*3/uL (ref 0.00–0.07)
Basophils Absolute: 0 10*3/uL (ref 0.0–0.1)
Basophils Relative: 1 %
Eosinophils Absolute: 0 10*3/uL (ref 0.0–0.5)
Eosinophils Relative: 1 %
HCT: 32.8 % — ABNORMAL LOW (ref 39.0–52.0)
Hemoglobin: 10.7 g/dL — ABNORMAL LOW (ref 13.0–17.0)
Immature Granulocytes: 0 %
Lymphocytes Relative: 16 %
Lymphs Abs: 0.4 10*3/uL — ABNORMAL LOW (ref 0.7–4.0)
MCH: 32.7 pg (ref 26.0–34.0)
MCHC: 32.6 g/dL (ref 30.0–36.0)
MCV: 100.3 fL — ABNORMAL HIGH (ref 80.0–100.0)
Monocytes Absolute: 0.4 10*3/uL (ref 0.1–1.0)
Monocytes Relative: 14 %
Neutro Abs: 1.9 10*3/uL (ref 1.7–7.7)
Neutrophils Relative %: 68 %
Platelets: 122 10*3/uL — ABNORMAL LOW (ref 150–400)
RBC: 3.27 MIL/uL — ABNORMAL LOW (ref 4.22–5.81)
RDW: 14.5 % (ref 11.5–15.5)
WBC: 2.7 10*3/uL — ABNORMAL LOW (ref 4.0–10.5)
nRBC: 0 % (ref 0.0–0.2)

## 2018-09-24 LAB — COMPREHENSIVE METABOLIC PANEL
ALT: 14 U/L (ref 0–44)
AST: 18 U/L (ref 15–41)
Albumin: 3 g/dL — ABNORMAL LOW (ref 3.5–5.0)
Alkaline Phosphatase: 65 U/L (ref 38–126)
Anion gap: 8 (ref 5–15)
BUN: 17 mg/dL (ref 8–23)
CO2: 31 mmol/L (ref 22–32)
Calcium: 9 mg/dL (ref 8.9–10.3)
Chloride: 99 mmol/L (ref 98–111)
Creatinine, Ser: 0.79 mg/dL (ref 0.61–1.24)
GFR calc Af Amer: >60 mL/min (ref >60)
GFR calc non Af Amer: >60 mL/min (ref >60)
Glucose, Bld: 117 mg/dL — ABNORMAL HIGH (ref 70–99)
Potassium: 3.9 mmol/L (ref 3.5–5.1)
Sodium: 138 mmol/L (ref 135–145)
Total Bilirubin: 0.3 mg/dL (ref 0.3–1.2)
Total Protein: 7.1 g/dL (ref 6.5–8.1)

## 2018-09-24 MED ORDER — SODIUM CHLORIDE 0.9 % IV SOLN
226.0000 mg | Freq: Once | INTRAVENOUS | Status: AC
  Administered 2018-09-24: 16:00:00 230 mg via INTRAVENOUS
  Filled 2018-09-24: qty 23

## 2018-09-24 MED ORDER — PALONOSETRON HCL INJECTION 0.25 MG/5ML
INTRAVENOUS | Status: AC
  Filled 2018-09-24: qty 5

## 2018-09-24 MED ORDER — SODIUM CHLORIDE 0.9 % IV SOLN
Freq: Once | INTRAVENOUS | Status: DC
  Filled 2018-09-24: qty 250

## 2018-09-24 MED ORDER — DIPHENHYDRAMINE HCL 50 MG/ML IJ SOLN
INTRAMUSCULAR | Status: AC
  Filled 2018-09-24: qty 1

## 2018-09-24 MED ORDER — FAMOTIDINE IN NACL 20-0.9 MG/50ML-% IV SOLN
20.0000 mg | Freq: Once | INTRAVENOUS | Status: AC
  Administered 2018-09-24: 14:00:00 20 mg via INTRAVENOUS

## 2018-09-24 MED ORDER — SODIUM CHLORIDE 0.9 % IV SOLN
20.0000 mg | Freq: Once | INTRAVENOUS | Status: AC
  Administered 2018-09-24: 14:00:00 20 mg via INTRAVENOUS
  Filled 2018-09-24: qty 20

## 2018-09-24 MED ORDER — DIPHENHYDRAMINE HCL 50 MG/ML IJ SOLN
25.0000 mg | Freq: Once | INTRAMUSCULAR | Status: AC
  Administered 2018-09-24: 25 mg via INTRAVENOUS

## 2018-09-24 MED ORDER — SODIUM CHLORIDE 0.9 % IV SOLN
INTRAVENOUS | Status: DC
  Filled 2018-09-24: qty 250

## 2018-09-24 MED ORDER — FAMOTIDINE IN NACL 20-0.9 MG/50ML-% IV SOLN
INTRAVENOUS | Status: AC
  Filled 2018-09-24: qty 50

## 2018-09-24 MED ORDER — SODIUM CHLORIDE 0.9 % IV SOLN
50.0000 mg/m2 | Freq: Once | INTRAVENOUS | Status: AC
  Administered 2018-09-24: 15:00:00 90 mg via INTRAVENOUS
  Filled 2018-09-24: qty 15

## 2018-09-24 MED ORDER — SODIUM CHLORIDE 0.9 % IV SOLN
INTRAVENOUS | Status: AC
  Administered 2018-09-24: 14:00:00 via INTRAVENOUS
  Filled 2018-09-24 (×2): qty 250

## 2018-09-24 MED ORDER — PALONOSETRON HCL INJECTION 0.25 MG/5ML
0.2500 mg | Freq: Once | INTRAVENOUS | Status: AC
  Administered 2018-09-24: 0.25 mg via INTRAVENOUS

## 2018-09-24 NOTE — Telephone Encounter (Signed)
Scheduled appt per 5/4 los.  Left a VM of scheduled appt.

## 2018-09-24 NOTE — Patient Instructions (Signed)
[facility] Discharge Instructions for Patients Receiving Chemotherapy  Today you received the following chemotherapy agents Taxol, Carboplatin  To help prevent nausea and vomiting after your treatment, we encourage you to take your nausea medication as directed  If you develop nausea and vomiting that is not controlled by your nausea medication, call the clinic.   BELOW ARE SYMPTOMS THAT SHOULD BE REPORTED IMMEDIATELY:  *FEVER GREATER THAN 100.5 F  *CHILLS WITH OR WITHOUT FEVER  NAUSEA AND VOMITING THAT IS NOT CONTROLLED WITH YOUR NAUSEA MEDICATION  *UNUSUAL SHORTNESS OF BREATH  *UNUSUAL BRUISING OR BLEEDING  TENDERNESS IN MOUTH AND THROAT WITH OR WITHOUT PRESENCE OF ULCERS  *URINARY PROBLEMS  *BOWEL PROBLEMS  UNUSUAL RASH Items with * indicate a potential emergency and should be followed up as soon as possible.  Feel free to call the clinic should you have any questions or concerns. The clinic phone number is [phone].  Please show the CHEMO ALERT CARD at check-in to the Emergency Department and triage nurse.   

## 2018-09-25 ENCOUNTER — Ambulatory Visit: Payer: BC Managed Care – PPO

## 2018-09-25 ENCOUNTER — Inpatient Hospital Stay: Payer: BC Managed Care – PPO

## 2018-09-25 MED ORDER — OSMOLITE 1.5 CAL PO LIQD: ORAL | 0 refills | Status: DC

## 2018-09-25 NOTE — Progress Notes (Signed)
Nutrition Follow-up:  RD working remotely.  Patient with cancer of the larynx and receiving chemotherapy and radiation therapy.  Patient followed by Dr. Irene Limbo and Isidore Moos.  Called patient for nutrition follow-up.  Patient reports that everything has been the same with tube feeding.  Continues giving 1455ml of osmolite 1.5 daily (6 cartons divided between 4 feedings).  Has been out of th promod for the past week due to previous giving more than prescribed.  Shipment to arrive this week per patient.  Continues with 1176ml water flush daily.    Reports no other changes regarding nutrition.      Medications: reviewed  Labs: reviewed  Anthropometrics:   Weight decreased to 139 lb 9.6 oz on 5/4 from 144 lb 8 oz from 4/27.   Estimated Energy Needs  Kcals: 9604-5409 calories Protein: 100-112 g Fluid: 2.3 L  NUTRITION DIAGNOSIS: Unintentional weight loss continues   INTERVENTION:  Recommend increasing osmolite 1.5 to 7 cartons daily and promod 56ml BID.  Discussed options of how to deliver feeding during the day.  Patient wants to continue with 4 feedings and add additional 49ml of tube feeding at each of 4 feedings to get 7th carton.   Patient expects to get new shipment of promod and osmolite this week.   Tube feeding regimen will provide 2685 calories, 123.9 g protein and 2468ml free water.   Will contact [place] regarding increasing tube feeding order from 6 cartons daily to 7 cartons daily. Patient has contact information    MONITORING, EVALUATION, GOAL: Patient will tolerate tube feeding and promod to minimize weight loss.   NEXT VISIT: phone f/u Tuesday, May 12  [place] Zenia Resides, [place], [place] Registered Dietitian [phone] (pager)

## 2018-09-26 ENCOUNTER — Other Ambulatory Visit: Payer: Self-pay

## 2018-09-26 ENCOUNTER — Ambulatory Visit
Admission: RE | Admit: 2018-09-26 | Discharge: 2018-09-26 | Disposition: A | Discharge status: Home / Self Care | Payer: BC Managed Care – PPO | Source: Ambulatory Visit | Attending: Radiation Oncology | Admitting: Radiation Oncology

## 2018-09-26 DIAGNOSIS — C320 Malignant neoplasm of glottis: Secondary | ICD-10-CM | POA: Diagnosis not present

## 2018-09-27 ENCOUNTER — Ambulatory Visit
Admission: RE | Admit: 2018-09-27 | Discharge: 2018-09-27 | Disposition: A | Discharge status: Home / Self Care | Payer: BC Managed Care – PPO | Source: Ambulatory Visit | Attending: Radiation Oncology | Admitting: Radiation Oncology

## 2018-09-27 ENCOUNTER — Inpatient Hospital Stay: Payer: BC Managed Care – PPO

## 2018-09-27 ENCOUNTER — Other Ambulatory Visit: Payer: Self-pay

## 2018-09-27 VITALS — BP 96/70 | HR 72 | Temp 97.8°F

## 2018-09-27 DIAGNOSIS — C320 Malignant neoplasm of glottis: Secondary | ICD-10-CM | POA: Diagnosis not present

## 2018-09-27 DIAGNOSIS — E860 Dehydration: Secondary | ICD-10-CM

## 2018-09-27 MED ORDER — SODIUM CHLORIDE 0.9 % IV SOLN
INTRAVENOUS | Status: DC
  Administered 2018-09-27: 08:00:00 via INTRAVENOUS
  Filled 2018-09-27 (×2): qty 250

## 2018-09-27 MED ORDER — SODIUM CHLORIDE 0.9 % IV SOLN
INTRAVENOUS | Status: DC
  Administered 2018-09-27: 10:00:00 via INTRAVENOUS
  Filled 2018-09-27 (×2): qty 250

## 2018-09-27 MED ORDER — SODIUM CHLORIDE 0.9 % IV SOLN
INTRAVENOUS | Status: DC
  Filled 2018-09-27: qty 250

## 2018-09-27 NOTE — Patient Instructions (Signed)
Dehydration, Adult  Dehydration is when there is not enough fluid or water in your body. This happens when you lose more fluids than you take in. Dehydration can range from mild to very bad. It should be treated right away to keep it from getting very bad. Symptoms of mild dehydration may include:  Thirst.  Dry lips.  Slightly dry mouth.  Dry, warm skin.  Dizziness. Symptoms of moderate dehydration may include:  Very dry mouth.  Muscle cramps.  Dark pee (urine). Pee may be the color of tea.  Your body making less pee.  Your eyes making fewer tears.  Heartbeat that is uneven or faster than normal (palpitations).  Headache.  Light-headedness, especially when you stand up from sitting.  Fainting (syncope). Symptoms of very bad dehydration may include:  Changes in skin, such as: ? Cold and clammy skin. ? Blotchy (mottled) or pale skin. ? Skin that does not quickly return to normal after being lightly pinched and let go (poor skin turgor).  Changes in body fluids, such as: ? Feeling very thirsty. ? Your eyes making fewer tears. ? Not sweating when body temperature is high, such as in hot weather. ? Your body making very little pee.  Changes in vital signs, such as: ? Weak pulse. ? Pulse that is more than 100 beats a minute when you are sitting still. ? Fast breathing. ? Low blood pressure.  Other changes, such as: ? Sunken eyes. ? Cold hands and feet. ? Confusion. ? Lack of energy (lethargy). ? Trouble waking up from sleep. ? Short-term weight loss. ? Unconsciousness. Follow these instructions at home:   If told by your doctor, drink an ORS: ? Make an ORS by using instructions on the package. ? Start by drinking small amounts, about  cup (120 mL) every 5-10 minutes. ? Slowly drink more until you have had the amount that your doctor said to have.  Drink enough clear fluid to keep your pee clear or pale yellow. If you were told to drink an ORS, finish the  ORS first, then start slowly drinking clear fluids. Drink fluids such as: ? Water. Do not drink only water by itself. Doing that can make the salt (sodium) level in your body get too low (hyponatremia). ? Ice chips. ? Fruit juice that you have added water to (diluted). ? Low-calorie sports drinks.  Avoid: ? Alcohol. ? Drinks that have a lot of sugar. These include high-calorie sports drinks, fruit juice that does not have water added, and soda. ? Caffeine. ? Foods that are greasy or have a lot of fat or sugar.  Take over-the-counter and prescription medicines only as told by your doctor.  Do not take salt tablets. Doing that can make the salt level in your body get too high (hypernatremia).  Eat foods that have minerals (electrolytes). Examples include bananas, oranges, potatoes, tomatoes, and spinach.  Keep all follow-up visits as told by your doctor. This is important. Contact a doctor if:  You have belly (abdominal) pain that: ? Gets worse. ? Stays in one area (localizes).  You have a rash.  You have a stiff neck.  You get angry or annoyed more easily than normal (irritability).  You are more sleepy than normal.  You have a harder time waking up than normal.  You feel: ? Weak. ? Dizzy. ? Very thirsty.  You have peed (urinated) only a small amount of very dark pee during 6-8 hours. Get help right away if:  You have   symptoms of very bad dehydration.  You cannot drink fluids without throwing up (vomiting).  Your symptoms get worse with treatment.  You have a fever.  You have a very bad headache.  You are throwing up or having watery poop (diarrhea) and it: ? Gets worse. ? Does not go away.  You have blood or something green (bile) in your throw-up.  You have blood in your poop (stool). This may cause poop to look black and tarry.  You have not peed in 6-8 hours.  You pass out (faint).  Your heart rate when you are sitting still is more than 100 beats a  minute.  You have trouble breathing. This information is not intended to replace advice given to you by your health care provider. Make sure you discuss any questions you have with your health care provider. Document Released: 03/05/2009 Document Revised: 11/27/2015 Document Reviewed: 07/03/2015 Elsevier Interactive Patient Education  2019 [employer]vier Inc.  

## 2018-09-27 NOTE — Progress Notes (Signed)
Patient has received his radiation treatment for today. Normal saline infusion complete. Discontinued fluids. Discontinued left hand IV. Catheter intact upon removal. Applied occlusive dressing to patient's left hand at old IV site. Patient understands to remove dressing once he arrives home. Vitals stable. BP improved. Patient denies additional needs at this time. Patient discharged home, ambulatory and in no distress.

## 2018-09-27 NOTE — Progress Notes (Signed)
Second liter of normal saline infusing. Patient escorted down to Radiation Oncology for his appointment. Patient ambulatory with Scott-(rad onc transporter) pushing IV pole for patient. Per infusion charge nurse, rad onc nurse will monitor patient and d/c infusion and IV upon completion.

## 2018-09-27 NOTE — Progress Notes (Signed)
May run 2nd liter of fluids over one hour per MD Irene Limbo so he can go to radiation

## 2018-09-28 ENCOUNTER — Other Ambulatory Visit: Payer: Self-pay

## 2018-09-28 ENCOUNTER — Ambulatory Visit
Admission: RE | Admit: 2018-09-28 | Discharge: 2018-09-28 | Disposition: A | Discharge status: Home / Self Care | Payer: BC Managed Care – PPO | Source: Ambulatory Visit | Attending: Radiation Oncology | Admitting: Radiation Oncology

## 2018-09-28 DIAGNOSIS — C320 Malignant neoplasm of glottis: Secondary | ICD-10-CM | POA: Diagnosis not present

## 2018-10-01 ENCOUNTER — Other Ambulatory Visit: Payer: Self-pay

## 2018-10-01 ENCOUNTER — Ambulatory Visit
Admission: RE | Admit: 2018-10-01 | Discharge: 2018-10-01 | Disposition: A | Discharge status: Home / Self Care | Payer: BC Managed Care – PPO | Source: Ambulatory Visit | Attending: Radiation Oncology | Admitting: Radiation Oncology

## 2018-10-01 ENCOUNTER — Inpatient Hospital Stay (HOSPITAL_BASED_OUTPATIENT_CLINIC_OR_DEPARTMENT_OTHER): Payer: BC Managed Care – PPO | Admitting: Hematology

## 2018-10-01 ENCOUNTER — Telehealth: Payer: Self-pay | Admitting: *Deleted

## 2018-10-01 ENCOUNTER — Inpatient Hospital Stay: Payer: BC Managed Care – PPO

## 2018-10-01 VITALS — BP 117/76 | HR 100 | Temp 99.4°F | Resp 18 | Ht 72.0 in | Wt 134.6 lb

## 2018-10-01 DIAGNOSIS — Z5111 Encounter for antineoplastic chemotherapy: Secondary | ICD-10-CM

## 2018-10-01 DIAGNOSIS — Z7189 Other specified counseling: Secondary | ICD-10-CM

## 2018-10-01 DIAGNOSIS — R930 Abnormal findings on diagnostic imaging of skull and head, not elsewhere classified: Secondary | ICD-10-CM

## 2018-10-01 DIAGNOSIS — B009 Herpesviral infection, unspecified: Secondary | ICD-10-CM

## 2018-10-01 DIAGNOSIS — Z79899 Other long term (current) drug therapy: Secondary | ICD-10-CM

## 2018-10-01 DIAGNOSIS — G893 Neoplasm related pain (acute) (chronic): Secondary | ICD-10-CM

## 2018-10-01 DIAGNOSIS — R634 Abnormal weight loss: Secondary | ICD-10-CM

## 2018-10-01 DIAGNOSIS — Z87891 Personal history of nicotine dependence: Secondary | ICD-10-CM

## 2018-10-01 DIAGNOSIS — J029 Acute pharyngitis, unspecified: Secondary | ICD-10-CM

## 2018-10-01 DIAGNOSIS — C320 Malignant neoplasm of glottis: Secondary | ICD-10-CM

## 2018-10-01 DIAGNOSIS — E440 Moderate protein-calorie malnutrition: Secondary | ICD-10-CM

## 2018-10-01 DIAGNOSIS — D72819 Decreased white blood cell count, unspecified: Secondary | ICD-10-CM | POA: Diagnosis not present

## 2018-10-01 DIAGNOSIS — R911 Solitary pulmonary nodule: Secondary | ICD-10-CM

## 2018-10-01 DIAGNOSIS — R197 Diarrhea, unspecified: Secondary | ICD-10-CM | POA: Diagnosis not present

## 2018-10-01 DIAGNOSIS — D696 Thrombocytopenia, unspecified: Secondary | ICD-10-CM

## 2018-10-01 DIAGNOSIS — J449 Chronic obstructive pulmonary disease, unspecified: Secondary | ICD-10-CM

## 2018-10-01 DIAGNOSIS — E860 Dehydration: Secondary | ICD-10-CM

## 2018-10-01 DIAGNOSIS — D649 Anemia, unspecified: Secondary | ICD-10-CM

## 2018-10-01 DIAGNOSIS — R1111 Vomiting without nausea: Secondary | ICD-10-CM

## 2018-10-01 DIAGNOSIS — R110 Nausea: Secondary | ICD-10-CM

## 2018-10-01 LAB — COMPREHENSIVE METABOLIC PANEL
ALT: 19 U/L (ref 0–44)
AST: 18 U/L (ref 15–41)
Albumin: 3.1 g/dL — ABNORMAL LOW (ref 3.5–5.0)
Alkaline Phosphatase: 69 U/L (ref 38–126)
Anion gap: 11 (ref 5–15)
BUN: 16 mg/dL (ref 8–23)
CO2: 28 mmol/L (ref 22–32)
Calcium: 9.5 mg/dL (ref 8.9–10.3)
Chloride: 101 mmol/L (ref 98–111)
Creatinine, Ser: 0.77 mg/dL (ref 0.61–1.24)
GFR calc Af Amer: >60 mL/min (ref >60)
GFR calc non Af Amer: >60 mL/min (ref >60)
Glucose, Bld: 96 mg/dL (ref 70–99)
Potassium: 4.3 mmol/L (ref 3.5–5.1)
Sodium: 140 mmol/L (ref 135–145)
Total Bilirubin: 0.5 mg/dL (ref 0.3–1.2)
Total Protein: 7.5 g/dL (ref 6.5–8.1)

## 2018-10-01 LAB — CBC WITH DIFFERENTIAL/PLATELET
Abs Immature Granulocytes: 0.03 10*3/uL (ref 0.00–0.07)
Basophils Absolute: 0 10*3/uL (ref 0.0–0.1)
Basophils Relative: 1 %
Eosinophils Absolute: 0 10*3/uL (ref 0.0–0.5)
Eosinophils Relative: 1 %
HCT: 34.4 % — ABNORMAL LOW (ref 39.0–52.0)
Hemoglobin: 11.3 g/dL — ABNORMAL LOW (ref 13.0–17.0)
Immature Granulocytes: 1 %
Lymphocytes Relative: 23 %
Lymphs Abs: 0.6 10*3/uL — ABNORMAL LOW (ref 0.7–4.0)
MCH: 32.8 pg (ref 26.0–34.0)
MCHC: 32.8 g/dL (ref 30.0–36.0)
MCV: 100 fL (ref 80.0–100.0)
Monocytes Absolute: 0.4 10*3/uL (ref 0.1–1.0)
Monocytes Relative: 14 %
Neutro Abs: 1.6 10*3/uL — ABNORMAL LOW (ref 1.7–7.7)
Neutrophils Relative %: 60 %
Platelets: 118 10*3/uL — ABNORMAL LOW (ref 150–400)
RBC: 3.44 MIL/uL — ABNORMAL LOW (ref 4.22–5.81)
RDW: 15.1 % (ref 11.5–15.5)
WBC: 2.6 10*3/uL — ABNORMAL LOW (ref 4.0–10.5)
nRBC: 0 % (ref 0.0–0.2)

## 2018-10-01 LAB — MAGNESIUM: Magnesium: 1.9 mg/dL (ref 1.7–2.4)

## 2018-10-01 MED ORDER — SODIUM CHLORIDE 0.9 % IV SOLN
20.0000 mg | Freq: Once | INTRAVENOUS | Status: AC
  Administered 2018-10-01: 20 mg via INTRAVENOUS
  Filled 2018-10-01: qty 20

## 2018-10-01 MED ORDER — SONAFINE EX EMUL
1.0000 "application " | Freq: Once | CUTANEOUS | Status: AC
  Administered 2018-10-01: 1 via TOPICAL

## 2018-10-01 MED ORDER — METHADONE HCL 10 MG/5ML PO SOLN: 20.0000 mg | Freq: Three times a day (TID) | ORAL | 0 refills | Status: DC

## 2018-10-01 MED ORDER — ONDANSETRON HCL 4 MG/5ML PO SOLN: 8.0000 mg | Freq: Two times a day (BID) | ORAL | 1 refills | Status: DC | PRN

## 2018-10-01 MED ORDER — FAMOTIDINE IN NACL 20-0.9 MG/50ML-% IV SOLN
INTRAVENOUS | Status: AC
  Filled 2018-10-01: qty 50

## 2018-10-01 MED ORDER — PALONOSETRON HCL INJECTION 0.25 MG/5ML
INTRAVENOUS | Status: AC
  Filled 2018-10-01: qty 5

## 2018-10-01 MED ORDER — PALONOSETRON HCL INJECTION 0.25 MG/5ML
0.2500 mg | Freq: Once | INTRAVENOUS | Status: AC
  Administered 2018-10-01: 14:00:00 0.25 mg via INTRAVENOUS

## 2018-10-01 MED ORDER — SODIUM CHLORIDE 0.9 % IV SOLN
226.0000 mg | Freq: Once | INTRAVENOUS | Status: AC
  Administered 2018-10-01: 230 mg via INTRAVENOUS
  Filled 2018-10-01: qty 23

## 2018-10-01 MED ORDER — SODIUM CHLORIDE 0.9 % IV SOLN
Freq: Once | INTRAVENOUS | Status: AC
  Administered 2018-10-01: 13:00:00 via INTRAVENOUS
  Filled 2018-10-01: qty 250

## 2018-10-01 MED ORDER — DIPHENHYDRAMINE HCL 50 MG/ML IJ SOLN
25.0000 mg | Freq: Once | INTRAMUSCULAR | Status: AC
  Administered 2018-10-01: 14:00:00 25 mg via INTRAVENOUS

## 2018-10-01 MED ORDER — FAMOTIDINE IN NACL 20-0.9 MG/50ML-% IV SOLN
20.0000 mg | Freq: Once | INTRAVENOUS | Status: AC
  Administered 2018-10-01: 14:00:00 20 mg via INTRAVENOUS

## 2018-10-01 MED ORDER — METHADONE HCL 10 MG/5ML PO SOLN: 20.0000 mg | Freq: Three times a day (TID) | ORAL | 0 refills | Status: AC

## 2018-10-01 MED ORDER — DIPHENHYDRAMINE HCL 50 MG/ML IJ SOLN
INTRAMUSCULAR | Status: AC
  Filled 2018-10-01: qty 1

## 2018-10-01 MED ORDER — SODIUM CHLORIDE 0.9 % IV SOLN
50.0000 mg/m2 | Freq: Once | INTRAVENOUS | Status: AC
  Administered 2018-10-01: 90 mg via INTRAVENOUS
  Filled 2018-10-01: qty 15

## 2018-10-01 NOTE — Patient Instructions (Addendum)
[facility] Discharge Instructions for Patients Receiving Chemotherapy  Today you received the following chemotherapy agents: Taxol, Carboplatin   To help prevent nausea and vomiting after your treatment, we encourage you to take your nausea medication as directed.    If you develop nausea and vomiting that is not controlled by your nausea medication, call the clinic.   BELOW ARE SYMPTOMS THAT SHOULD BE REPORTED IMMEDIATELY:  *FEVER GREATER THAN 100.5 F  *CHILLS WITH OR WITHOUT FEVER  NAUSEA AND VOMITING THAT IS NOT CONTROLLED WITH YOUR NAUSEA MEDICATION  *UNUSUAL SHORTNESS OF BREATH  *UNUSUAL BRUISING OR BLEEDING  TENDERNESS IN MOUTH AND THROAT WITH OR WITHOUT PRESENCE OF ULCERS  *URINARY PROBLEMS  *BOWEL PROBLEMS  UNUSUAL RASH Items with * indicate a potential emergency and should be followed up as soon as possible.  Feel free to call the clinic should you have any questions or concerns. The clinic phone number is [phone].  Please show the [place] at check-in to the Emergency Department and triage nurse.   Coronavirus (COVID-19) Are you at risk?  Are you at risk for the Coronavirus (COVID-19)?  To be considered HIGH RISK for Coronavirus (COVID-19), you have to meet the following criteria:  . Traveled to [place], [place], [place], [place] or [place]; or in the [place] to [place], [place], [place], or [place]; and have fever, cough, and shortness of breath within the last 2 weeks of travel OR . Been in close contact with a person diagnosed with COVID-19 within the last 2 weeks and have fever, cough, and shortness of breath . IF YOU DO NOT MEET THESE CRITERIA, YOU ARE CONSIDERED LOW RISK FOR COVID-19.  What to do if you are HIGH RISK for COVID-19?  Marland Kitchen If you are having a medical emergency, call 911. . Seek medical care right away. Before you go to a doctor's office, urgent care or emergency department, call ahead and tell them  about your recent travel, contact with someone diagnosed with COVID-19, and your symptoms. You should receive instructions from your physician's office regarding next steps of care.  . When you arrive at healthcare provider, tell the healthcare staff immediately you have returned from visiting [place], [place], [place], [place] or [place]; or traveled in the [place] to [place], [place], [place], or [place]; in the last two weeks or you have been in close contact with a person diagnosed with COVID-19 in the last 2 weeks.   . Tell the health care staff about your symptoms: fever, cough and shortness of breath. . After you have been seen by a medical provider, you will be either: o Tested for (COVID-19) and discharged home on quarantine except to seek medical care if symptoms worsen, and asked to  - Stay home and avoid contact with others until you get your results (4-5 days)  - Avoid travel on public transportation if possible (such as bus, train, or airplane) or o Sent to the Emergency Department by EMS for evaluation, COVID-19 testing, and possible admission depending on your condition and test results.  What to do if you are LOW RISK for COVID-19?  Reduce your risk of any infection by using the same precautions used for avoiding the common cold or flu:  Marland Kitchen Wash your hands often with soap and warm water for at least 20 seconds.  If soap and water are not readily available, use an alcohol-based hand sanitizer with at least 60% alcohol.  Marland Kitchen  If coughing or sneezing, cover your mouth and nose by coughing or sneezing into the elbow areas of your shirt or coat, into a tissue or into your sleeve (not your hands). . Avoid shaking hands with others and consider head nods or verbal greetings only. . Avoid touching your eyes, nose, or mouth with unwashed hands.  . Avoid close contact with people who are sick. . Avoid places or events with large numbers of people in one location, like concerts or  sporting events. . Carefully consider travel plans you have or are making. . If you are planning any travel outside or inside the [place], visit the CDC's Travelers' Health webpage for the latest health notices. . If you have some symptoms but not all symptoms, continue to monitor at home and seek medical attention if your symptoms worsen. . If you are having a medical emergency, call 911.   [place] / e-Visit: [email or web address]         [facility]: [facility]: [phone]                   [facility]: [phone]

## 2018-10-01 NOTE — Telephone Encounter (Signed)
Methadone availble at [facility]. Dr. Irene Limbo resent prescription to them Patient informed. Patient states [facility], he states he filled it there before without any problem.

## 2018-10-01 NOTE — Progress Notes (Signed)
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  The patient was undergoing outpatient evaluation for lung nodules and a vocal cord lesion and presented to the emergency room with worsening dyspnea.  ENT was consulted for his vocal cord mass.  Due to the location of the laryngeal mass, the patient was in danger of developing an upper airway obstruction.  A tracheostomy was placed to protect his airway.  A biopsy of the mass was performed.  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  The patient tells me that he was having persistent upper respiratory symptoms with dyspnea and cough since about November 2019.  He was on several rounds of antibiotics and steroids without significant improvement.  He reports a 90-month history of hoarseness in his voice.  He has lost about 40 pounds over the past 6 months.  He also reports having a sore throat.  Denies difficulty swallowing to admission.  Breathing is better since placement of tracheostomy.  He has not had any fevers or chills.  Denies chest pain.  Denies nausea, vomiting, constipation, diarrhea.  He is on NG tube feeding at this time.  Working with speech therapy on swallowing.  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  The patient is widowed and currently lives alone.  Wife died about 15 months ago secondary to lung cancer. Upon discharge, he plans to go live with 1 of his daughters in [place], [place].  He has 3 daughters who live locally.  1 of  his daughters is an Therapist, sports who works on the oncology unit at [employer].  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 The patient's last visit with [place] was on 09/24/18. The pt reports that he is doing well overall.  The pt reports that he has been having diarrhea after eating his meals for the last 3 days. He notes that he took 4mL imodium yesterday after a diarrhea episode and 21mL after a second diarrhea episode, in the last 24 hours. The pt notes that he is now consuming 4-5 cans through his PEG tube, and is calculated to consume 7. He notes that his nausea has been limiting for him in the last week. He has been consuming 167mL of water with each tube feeding, without subsequent water.   The pt notes that his throat pain has worsened as he ran out of his methodone last Thursday, 09/27/18. His next refill is scheduled for Friday 10/05/18. He reports that he discussed this with his clinic.  He denies concerns for infections at this time.  Lab results today (10/01/18) of CBC w/diff and CMP is as follows: all values are WNL except for WBC at 2.6k, RBC at 3.44, HGB at 11.3, HCT at 34.4, PLT at 118k, ANC at 1.6k, Lymphs abs at 600, Albumin at 3.1. 10/01/18 Magnesium at 1.9  On review of systems, pt reports diarrhea, throat pain, consuming less, nausea, and denies abdominal pains, leg swelling, fevers, concerns for infections, and any other  symptoms.   MEDICAL HISTORY:  Past Medical History:  Diagnosis Date   Bronchitis 03/2018   COPD (chronic obstructive pulmonary disease) (HCC)    Herpes    Lung nodule 2020   Substance abuse ([facility])    opiate addiction, been on methadone for 11 years    SURGICAL HISTORY: Past Surgical History:  Procedure Laterality Date   DIRECT LARYNGOSCOPY N/A 07/21/2018   Procedure: DIRECT LARYNGOSCOPY, TRACHEOSTOMY, BIOPSY;  Surgeon: Leta Baptist, MD;  Location: [facility];   Service: ENT;  Laterality: N/A;   IR GASTROSTOMY TUBE MOD SED  08/02/2018    SOCIAL HISTORY: Social History   Socioeconomic History   Marital status: Widowed    Spouse name: Not on file   Number of children: Not on file   Years of education: Not on file   Highest education level: Not on file  Occupational History   Not on file  Social Needs   Financial resource strain: Not on file   Food insecurity:    Worry: Not on file    Inability: Not on file   Transportation needs:    Medical: No    Non-medical: No  Tobacco Use   Smoking status: Former Smoker    Packs/day: 2.00    Years: 50.00    Pack years: 100.00    Types: Cigarettes   Smokeless tobacco: Never Used   Tobacco comment: 07/06/18 at 10 cigs per day, quit 06/16/18  Substance and Sexual Activity   Alcohol use: Not Currently   Drug use: Not Currently    Comment: hx of opiod abuse   Sexual activity: Not Currently    Birth control/protection: None  Lifestyle   Physical activity:    Days per week: Not on file    Minutes per session: Not on file   Stress: Not on file  Relationships   Social connections:    Talks on phone: Not on file    Gets together: Not on file    Attends religious service: Not on file    Active member of club or organization: Not on file    Attends meetings of clubs or organizations: Not on file    Relationship status: Not on file   Intimate partner violence:    Fear of current or ex partner: No    Emotionally abused: No    Physically abused: No    Forced sexual activity: No  Other Topics Concern   Not on file  Social History Narrative   Not on file    FAMILY HISTORY: Family History  Problem Relation Age of Onset   Alcohol abuse Father    Cancer Father    Cirrhosis Father    Drug abuse Brother     ALLERGIES:  has No Known Allergies.  MEDICATIONS:  Current Outpatient Medications  Medication Sig Dispense Refill   albuterol (PROVENTIL HFA;VENTOLIN HFA) 108  (90 Base) MCG/ACT inhaler Inhale 1-2 puffs into the lungs every 6 (six) hours as needed for wheezing.      budesonide (PULMICORT) 0.5 MG/2ML nebulizer solution Take 2 mLs (0.5 mg total) by nebulization 2 (two) times daily. 2 mL 12   dexamethasone (DEXAMETHASONE INTENSOL) 1 MG/ML solution Place 8 mLs (8 mg total) into feeding tube daily. Take 8 mg total by mouth daily starting the day after chemotherapy for 2 days 60 mL 3   formoterol (PERFOROMIST) 20 MCG/2ML nebulizer solution Take 2 mLs (20 mcg total) by nebulization 2 (two) times daily. 2 mL 3   ibuprofen (ADVIL,MOTRIN)  100 MG/5ML suspension Place 20-30 mLs (400-600 mg total) into feeding tube every 8 (eight) hours as needed for mild pain. 300 mL 0   ipratropium-albuterol (DUONEB) 0.5-2.5 (3) MG/3ML SOLN Take 3 mLs by nebulization every 6 (six) hours as needed. 360 mL 3   lidocaine (XYLOCAINE) 2 % solution Patient: Mix 1part 2% viscous lidocaine, 1part H20. Swallow 50mL of diluted mixture, 77min before meals and at bedtime, up to QID 100 mL 5   lidocaine-prilocaine (EMLA) cream Apply to affected area once 30 g 3   methadone (DOLOPHINE) 10 MG/5ML solution Take 10 mLs (20 mg total) by mouth 3 (three) times daily for 5 days. Patient to transition back to his methadone program from 10/06/2018 based on his appointment 150 mL 0   methadone (DOLOPHINE) 10 MG/ML solution Take 110 mg by mouth daily.     nicotine (NICODERM CQ - DOSED IN MG/24 HOURS) 21 mg/24hr patch Place 1 patch (21 mg total) onto the skin daily. 28 patch 0   Nutritional Supplements (FEEDING SUPPLEMENT, OSMOLITE 1.5 CAL,) LIQD Give 410 ml 4 times daily via feeding tube (7 cartons daily needed) 1659 mL 0   Nutritional Supplements (PROMOD) LIQD Give 30 mL Promod twice daily via PEG. 2 Bottle 2   ondansetron (ZOFRAN) 4 MG/5ML solution Place 10 mLs (8 mg total) into feeding tube 2 (two) times daily as needed for nausea or vomiting. Take 8 mg total 2 (two) times daily as needed for  refractory nausea / vomiting. Start on day 3 after chemo 100 mL 1   prochlorperazine (COMPAZINE) 10 MG tablet Take 1 tablet (10 mg total) by mouth every 6 (six) hours as needed (Nausea or vomiting). 30 tablet 1   prochlorperazine (COMPAZINE) 5 MG tablet Place 1-2 tablets (5-10 mg total) into feeding tube every 6 (six) hours as needed for nausea or vomiting. (Patient not taking: Reported on 08/20/2018) 30 tablet 0   sucralfate (CARAFATE) 1 GM/10ML suspension TAKE 10 MLS (1 G TOTAL) BY MOUTH 2 (TWO) TIMES DAILY. 420 mL 0   Water For Irrigation, Sterile (FREE WATER) SOLN Place 300 mLs into feeding tube every 8 (eight) hours. 5000 mL 0   No current facility-administered medications for this visit.     REVIEW OF SYSTEMS:    A 10+ POINT REVIEW OF SYSTEMS WAS OBTAINED including neurology, dermatology, psychiatry, cardiac, respiratory, lymph, extremities, GI, GU, Musculoskeletal, constitutional, breasts, reproductive, HEENT.  All pertinent positives are noted in the HPI.  All others are negative.   PHYSICAL EXAMINATION: ECOG PERFORMANCE STATUS: 1 - Symptomatic but completely ambulatory  Vitals:   10/01/18 1158  BP: 117/76  Pulse: 100  Resp: 18  Temp: 99.4 F (37.4 C)  SpO2: 98%   Filed Weights   10/01/18 1158  Weight: 134 lb 9.6 oz (61.1 kg)   .Body mass index is 18.26 kg/m.  GENERAL:alert, in no acute distress and comfortable SKIN: no acute rashes, no significant lesions EYES: conjunctiva are pink and non-injected, sclera anicteric OROPHARYNX: MMM, no exudates, no oropharyngeal erythema or ulceration NECK: supple, no JVD. Tracheostomy in situ. LYMPH:  no palpable lymphadenopathy in the cervical, axillary or inguinal regions LUNGS: clear to auscultation b/l with normal respiratory effort HEART: regular rate & rhythm ABDOMEN:  normoactive bowel sounds , non tender, not distended. No palpable hepatosplenomegaly. PEG tube in situ. Extremity: trace pedal edema PSYCH: alert & oriented  x 3 with fluent speech NEURO: no focal motor/sensory deficits   LABORATORY DATA:  I have reviewed the data as  listed  . CBC Latest Ref Rng & Units 10/01/2018 09/24/2018 09/17/2018  WBC 4.0 - 10.5 K/uL 2.6(L) 2.7(L) 3.8(L)  Hemoglobin 13.0 - 17.0 g/dL 11.3(L) 10.7(L) 10.5(L)  Hematocrit 39.0 - 52.0 % 34.4(L) 32.8(L) 32.5(L)  Platelets 150 - 400 K/uL 118(L) 122(L) 138(L)    . CMP Latest Ref Rng & Units 10/01/2018 09/24/2018 09/17/2018  Glucose 70 - 99 mg/dL 96 117(H) 73  BUN 8 - 23 mg/dL 16 17 18   Creatinine 0.61 - 1.24 mg/dL 0.77 0.79 0.78  Sodium 135 - 145 mmol/L 140 138 138  Potassium 3.5 - 5.1 mmol/L 4.3 3.9 4.3  Chloride 98 - 111 mmol/L 101 99 99  CO2 22 - 32 mmol/L 28 31 29   Calcium 8.9 - 10.3 mg/dL 9.5 9.0 9.2  Total Protein 6.5 - 8.1 g/dL 7.5 7.1 7.1  Total Bilirubin 0.3 - 1.2 mg/dL 0.5 0.3 0.3  Alkaline Phos 38 - 126 U/L 69 65 66  AST 15 - 41 U/L 18 18 20   ALT 0 - 44 U/L 19 14 16      RADIOGRAPHIC STUDIES: I have personally reviewed the radiological images as listed and agreed with the findings in the report. No results found.  ASSESSMENT & PLAN:   63 y.o. male with  1. 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  Discussed with Dr. Benjamine Mola at bedside patient is not a candidate for larynx sparing surgery. He has lost about 40 pounds and is nutritionally quite depleted. Heavy smoking history of 1 to 2 packs/day with COPD /emphysema.  Not on home oxygen. 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 Has tolerated his tracheostomy well and is been able to speak with Passy-Muir valve. Has had issues with swallowing and is still needing tube feeding.  07/17/18 PET/CT revealed he has multiple pulmonary nodules none of them are FDG avid. No FDG avid cervical lymphadenopathy. FDG avid  laryngeal tumor noted.  PLAN: -Discussed pt labwork today, 10/01/18; HGB improved to 11.3, PLT stable at 118k, ANC borderline low at 1.6k. Chemistries stable and Magnesium at 1.9 -Will give prescription of 20mg  Methodone TID until Friday 10/05/18 when he will return to care with his clinic. Believe that some of patient's new diarrhea is withdrawal related. -Will set pt up for IVF 3 times a week for the next 1-2 weeks -Pt will follow up with our nutritional therapist tomorrow, 10/02/18, and I stressed the importance of optimizing his nutrition -The pt has no prohibitive toxicities from continuing day 36 of his weekly Carboplatin and Taxol at this time. -Continue Sucralfate, Biotene, Lidocaine, and salt and baking soda mouthwashes -Follow up with our head and neck nurse navigator Gayleen Orem -Following with Dr Isidore Moos in [place] with absolute smoking cessation -Will see the pt back in 2 weeks   IV fluids 2Liters Monday/wednesday/friday for 3 weeks Weekly labs every Monday RTC with Dr Irene Limbo in 2 weeks   All of the patients questions were answered with apparent satisfaction. The patient knows to call the clinic with any problems, questions or concerns.  The total time spent in the appt was 25 minutes and more than 50% was on counseling and direct patient cares.    Sullivan Lone MD [place] [facility] Hematology/Oncology Physician [facility]  (Office):       [phone] (Work cell):  [phone] (Fax):           [phone]  10/01/2018 12:39 PM  I, Baldwin [place], am acting as a scribe for Dr. Sullivan Lone.   .I have reviewed the above documentation for accuracy and completeness, and I agree with the above. [name] MD

## 2018-10-01 NOTE — Telephone Encounter (Signed)
Contacted by Mickel Baas at [facility]. Methadone ordered by Dr. Irene Limbo not available [facility] or in a [facility] in 16 mile radius. Liquid Zofran not covered by patient's insurance d/t plan exclusion. Dr. Irene Limbo informed

## 2018-10-02 ENCOUNTER — Other Ambulatory Visit: Payer: Self-pay

## 2018-10-02 ENCOUNTER — Inpatient Hospital Stay: Payer: BC Managed Care – PPO

## 2018-10-02 ENCOUNTER — Ambulatory Visit
Admission: RE | Admit: 2018-10-02 | Discharge: 2018-10-02 | Disposition: A | Discharge status: Home / Self Care | Payer: BC Managed Care – PPO | Source: Ambulatory Visit | Attending: Radiation Oncology | Admitting: Radiation Oncology

## 2018-10-02 ENCOUNTER — Other Ambulatory Visit: Payer: Self-pay | Admitting: Pulmonary Disease

## 2018-10-02 ENCOUNTER — Telehealth: Payer: Self-pay | Admitting: Hematology

## 2018-10-02 DIAGNOSIS — C320 Malignant neoplasm of glottis: Secondary | ICD-10-CM | POA: Diagnosis not present

## 2018-10-02 MED FILL — METHADONE 10 MG/5 ML SOLUTI: 10 | 5 days supply | Qty: 150 | Fill #0

## 2018-10-02 NOTE — Telephone Encounter (Signed)
Scheduled appt per 5/11 los.  Patient wanted to start getting his fluids Friday (5/15).  Patient aware of his appt date and time.

## 2018-10-02 NOTE — Progress Notes (Signed)
Nutrition Follow-up:  RD working remotely.  Patient with cancer of the larynx and receiving chemotherapy and radiation therapy.  Patient followed by Dr. Irene Limbo and Isidore Moos.    Spoke with patient via phone for nutrition follow-up.  Patient reports that he has been taking 7 bottles of osmolite 1.5.  Further questioning of patient reveals patient only had 2 feeding yesterday due to being in chemotherapy. Missed 2 feedings on Saturday and Sunday due to diarrhea (noted per MD diarrhea may be due to methadone withdrawal).  Patient has not been taking promod for the last 2-3 weeks as he was initially taking more than prescribed.  Had told RD on 5/5 that was expecting osmolite and promod week of 5/5-5/8.    Patient reports that diarrhea is better and is back on track with tube feeding.    Medications: Noted per MD note patient ran out of methadone  Labs: reviewed  Anthropometrics:   Weight 134 lb 9.6 oz decreased from 139 lb on 5/4.    Weight decreased due to patient taking less than 7 cartons of tube feeding daily and out of promod (liquid protein)  Estimated Energy Needs  Kcals: 1980-2310 calories Protein: 100-112 g Fluid: 2.3 L  NUTRITION DIAGNOSIS: Unintentional weight loss continues   INTERVENTION:  Patient to give 7 bottles of osmolite 1.5 daily via feeding tube plus promod 60ml BID (currently out).   RD to follow-up with home health regarding promod delivery.   Patient will add additional 212ml water flush daily (separate from feedings) to provide additional fluid).  Likely not giving water flush with promod as has been out.  Noted IV fluids planned for M/W/F.      MONITORING, EVALUATION, GOAL: Patient will tolerate tube feeding and promod to minimize weight loss.    NEXT VISIT: phone f/u Tuesday 5/19  Leith Hedlund B. Zenia Resides, [place], [place] Registered Dietitian [phone] (pager)

## 2018-10-03 ENCOUNTER — Ambulatory Visit
Admission: RE | Admit: 2018-10-03 | Discharge: 2018-10-03 | Disposition: A | Discharge status: Home / Self Care | Payer: BC Managed Care – PPO | Source: Ambulatory Visit | Attending: Radiation Oncology | Admitting: Radiation Oncology

## 2018-10-03 ENCOUNTER — Ambulatory Visit: Payer: BLUE CROSS/BLUE SHIELD

## 2018-10-03 ENCOUNTER — Other Ambulatory Visit: Payer: Self-pay

## 2018-10-03 DIAGNOSIS — C320 Malignant neoplasm of glottis: Secondary | ICD-10-CM | POA: Diagnosis not present

## 2018-10-04 ENCOUNTER — Other Ambulatory Visit: Payer: Self-pay

## 2018-10-04 ENCOUNTER — Ambulatory Visit
Admission: RE | Admit: 2018-10-04 | Discharge: 2018-10-04 | Disposition: A | Discharge status: Home / Self Care | Payer: BC Managed Care – PPO | Source: Ambulatory Visit | Attending: Radiation Oncology | Admitting: Radiation Oncology

## 2018-10-04 DIAGNOSIS — C320 Malignant neoplasm of glottis: Secondary | ICD-10-CM | POA: Diagnosis not present

## 2018-10-05 ENCOUNTER — Ambulatory Visit
Admission: RE | Admit: 2018-10-05 | Discharge: 2018-10-05 | Disposition: A | Discharge status: Home / Self Care | Payer: BC Managed Care – PPO | Source: Ambulatory Visit | Attending: Radiation Oncology | Admitting: Radiation Oncology

## 2018-10-05 ENCOUNTER — Inpatient Hospital Stay: Payer: BC Managed Care – PPO

## 2018-10-05 ENCOUNTER — Other Ambulatory Visit: Payer: Self-pay

## 2018-10-05 VITALS — BP 121/78 | HR 80 | Temp 99.1°F | Resp 20

## 2018-10-05 DIAGNOSIS — Z7189 Other specified counseling: Secondary | ICD-10-CM

## 2018-10-05 DIAGNOSIS — E440 Moderate protein-calorie malnutrition: Secondary | ICD-10-CM

## 2018-10-05 DIAGNOSIS — C320 Malignant neoplasm of glottis: Secondary | ICD-10-CM | POA: Diagnosis not present

## 2018-10-05 MED ORDER — SODIUM CHLORIDE 0.9 % IV SOLN
INTRAVENOUS | Status: DC
  Administered 2018-10-05: 09:00:00 via INTRAVENOUS
  Filled 2018-10-05 (×2): qty 250

## 2018-10-05 NOTE — Patient Instructions (Signed)
Dehydration, Adult  Dehydration is when there is not enough fluid or water in your body. This happens when you lose more fluids than you take in. Dehydration can range from mild to very bad. It should be treated right away to keep it from getting very bad. Symptoms of mild dehydration may include:  Thirst.  Dry lips.  Slightly dry mouth.  Dry, warm skin.  Dizziness. Symptoms of moderate dehydration may include:  Very dry mouth.  Muscle cramps.  Dark pee (urine). Pee may be the color of tea.  Your body making less pee.  Your eyes making fewer tears.  Heartbeat that is uneven or faster than normal (palpitations).  Headache.  Light-headedness, especially when you stand up from sitting.  Fainting (syncope). Symptoms of very bad dehydration may include:  Changes in skin, such as: ? Cold and clammy skin. ? Blotchy (mottled) or pale skin. ? Skin that does not quickly return to normal after being lightly pinched and let go (poor skin turgor).  Changes in body fluids, such as: ? Feeling very thirsty. ? Your eyes making fewer tears. ? Not sweating when body temperature is high, such as in hot weather. ? Your body making very little pee.  Changes in vital signs, such as: ? Weak pulse. ? Pulse that is more than 100 beats a minute when you are sitting still. ? Fast breathing. ? Low blood pressure.  Other changes, such as: ? Sunken eyes. ? Cold hands and feet. ? Confusion. ? Lack of energy (lethargy). ? Trouble waking up from sleep. ? Short-term weight loss. ? Unconsciousness. Follow these instructions at home:   If told by your doctor, drink an ORS: ? Make an ORS by using instructions on the package. ? Start by drinking small amounts, about  cup (120 mL) every 5-10 minutes. ? Slowly drink more until you have had the amount that your doctor said to have.  Drink enough clear fluid to keep your pee clear or pale yellow. If you were told to drink an ORS, finish the  ORS first, then start slowly drinking clear fluids. Drink fluids such as: ? Water. Do not drink only water by itself. Doing that can make the salt (sodium) level in your body get too low (hyponatremia). ? Ice chips. ? Fruit juice that you have added water to (diluted). ? Low-calorie sports drinks.  Avoid: ? Alcohol. ? Drinks that have a lot of sugar. These include high-calorie sports drinks, fruit juice that does not have water added, and soda. ? Caffeine. ? Foods that are greasy or have a lot of fat or sugar.  Take over-the-counter and prescription medicines only as told by your doctor.  Do not take salt tablets. Doing that can make the salt level in your body get too high (hypernatremia).  Eat foods that have minerals (electrolytes). Examples include bananas, oranges, potatoes, tomatoes, and spinach.  Keep all follow-up visits as told by your doctor. This is important. Contact a doctor if:  You have belly (abdominal) pain that: ? Gets worse. ? Stays in one area (localizes).  You have a rash.  You have a stiff neck.  You get angry or annoyed more easily than normal (irritability).  You are more sleepy than normal.  You have a harder time waking up than normal.  You feel: ? Weak. ? Dizzy. ? Very thirsty.  You have peed (urinated) only a small amount of very dark pee during 6-8 hours. Get help right away if:  You have   symptoms of very bad dehydration.  You cannot drink fluids without throwing up (vomiting).  Your symptoms get worse with treatment.  You have a fever.  You have a very bad headache.  You are throwing up or having watery poop (diarrhea) and it: ? Gets worse. ? Does not go away.  You have blood or something green (bile) in your throw-up.  You have blood in your poop (stool). This may cause poop to look black and tarry.  You have not peed in 6-8 hours.  You pass out (faint).  Your heart rate when you are sitting still is more than 100 beats a  minute.  You have trouble breathing. This information is not intended to replace advice given to you by your health care provider. Make sure you discuss any questions you have with your health care provider. Document Released: 03/05/2009 Document Revised: 11/27/2015 Document Reviewed: 07/03/2015 Elsevier Interactive Patient Education  2019 [employer]vier Inc.  

## 2018-10-08 ENCOUNTER — Inpatient Hospital Stay: Payer: BC Managed Care – PPO

## 2018-10-08 ENCOUNTER — Other Ambulatory Visit: Payer: Self-pay

## 2018-10-08 ENCOUNTER — Ambulatory Visit
Admission: RE | Admit: 2018-10-08 | Discharge: 2018-10-08 | Disposition: A | Discharge status: Home / Self Care | Payer: BC Managed Care – PPO | Source: Ambulatory Visit | Attending: Radiation Oncology | Admitting: Radiation Oncology

## 2018-10-08 VITALS — BP 128/72 | HR 60 | Temp 98.3°F | Resp 18

## 2018-10-08 DIAGNOSIS — E860 Dehydration: Secondary | ICD-10-CM

## 2018-10-08 DIAGNOSIS — C320 Malignant neoplasm of glottis: Secondary | ICD-10-CM | POA: Diagnosis not present

## 2018-10-08 LAB — CMP (CANCER CENTER ONLY)
ALT: 15 U/L (ref 0–44)
AST: 15 U/L (ref 15–41)
Albumin: 2.7 g/dL — ABNORMAL LOW (ref 3.5–5.0)
Alkaline Phosphatase: 57 U/L (ref 38–126)
Anion gap: 6 (ref 5–15)
BUN: 15 mg/dL (ref 8–23)
CO2: 34 mmol/L — ABNORMAL HIGH (ref 22–32)
Calcium: 9.3 mg/dL (ref 8.9–10.3)
Chloride: 100 mmol/L (ref 98–111)
Creatinine: 0.69 mg/dL (ref 0.61–1.24)
GFR, Est AFR Am: >60 mL/min (ref >60)
GFR, Estimated: >60 mL/min (ref >60)
Glucose, Bld: 86 mg/dL (ref 70–99)
Potassium: 4.3 mmol/L (ref 3.5–5.1)
Sodium: 140 mmol/L (ref 135–145)
Total Bilirubin: 0.2 mg/dL — ABNORMAL LOW (ref 0.3–1.2)
Total Protein: 6.5 g/dL (ref 6.5–8.1)

## 2018-10-08 LAB — CBC WITH DIFFERENTIAL/PLATELET
Abs Immature Granulocytes: 0.01 10*3/uL (ref 0.00–0.07)
Basophils Absolute: 0 10*3/uL (ref 0.0–0.1)
Basophils Relative: 1 %
Eosinophils Absolute: 0 10*3/uL (ref 0.0–0.5)
Eosinophils Relative: 2 %
HCT: 28 % — ABNORMAL LOW (ref 39.0–52.0)
Hemoglobin: 8.9 g/dL — ABNORMAL LOW (ref 13.0–17.0)
Immature Granulocytes: 1 %
Lymphocytes Relative: 24 %
Lymphs Abs: 0.4 10*3/uL — ABNORMAL LOW (ref 0.7–4.0)
MCH: 32.8 pg (ref 26.0–34.0)
MCHC: 31.8 g/dL (ref 30.0–36.0)
MCV: 103.3 fL — ABNORMAL HIGH (ref 80.0–100.0)
Monocytes Absolute: 0.3 10*3/uL (ref 0.1–1.0)
Monocytes Relative: 18 %
Neutro Abs: 0.9 10*3/uL — ABNORMAL LOW (ref 1.7–7.7)
Neutrophils Relative %: 54 %
Platelets: 120 10*3/uL — ABNORMAL LOW (ref 150–400)
RBC: 2.71 MIL/uL — ABNORMAL LOW (ref 4.22–5.81)
RDW: 15.3 % (ref 11.5–15.5)
WBC: 1.7 10*3/uL — ABNORMAL LOW (ref 4.0–10.5)
nRBC: 0 % (ref 0.0–0.2)

## 2018-10-08 MED ORDER — SODIUM CHLORIDE 0.9 % IV SOLN
INTRAVENOUS | Status: DC
  Administered 2018-10-08: 09:00:00 via INTRAVENOUS
  Filled 2018-10-08 (×2): qty 250

## 2018-10-08 NOTE — Patient Instructions (Signed)

## 2018-10-09 ENCOUNTER — Other Ambulatory Visit: Payer: Self-pay

## 2018-10-09 ENCOUNTER — Inpatient Hospital Stay: Payer: BC Managed Care – PPO

## 2018-10-09 ENCOUNTER — Ambulatory Visit
Admission: RE | Admit: 2018-10-09 | Discharge: 2018-10-09 | Disposition: A | Discharge status: Home / Self Care | Payer: BC Managed Care – PPO | Source: Ambulatory Visit | Attending: Radiation Oncology | Admitting: Radiation Oncology

## 2018-10-09 DIAGNOSIS — C320 Malignant neoplasm of glottis: Secondary | ICD-10-CM | POA: Diagnosis not present

## 2018-10-09 NOTE — Progress Notes (Signed)
Nutrition Follow-up:  RD working remotely.  Patient with cancer of the larynx and receiving chemotherapy and radiation therapy.  Patient followed by Dr. Irene Limbo and Isidore Moos.  Spoke with patient via phone for nutrition follow-up.  Patient reports that he has been taking 7 cartons of osmolite 1.5 (164ml) daily.  Typically has been taking 2 cartons at 1st, 2nd and 3rd feeding and 1 carton at 4th feeding.  Patient did receive promod last week and has been giving 89ml BID (1st and 3rd feedings).  Reports that he is giving 1227ml free water via tube with water flushes.  Reports that diarrhea has cleared up and has not had any issues with nausea since last Monday, May 11th.    Patient reports that he received fluids yesterday and Friday.  He is planning on receiving fluids on Wednesday and Friday of this week.    Reports that he is feeling better this week.   Medications: reviewed  Labs: reviewed  Anthropometrics:   No new weight since 5/11 of 134 lb.  Patient reports that he thinks he has gained weight.     Estimated Energy Needs  Kcals: 1308-6578 calories Protein: 100-112 g Fluid: 2.3 L  NUTRITION DIAGNOSIS: Unintentional weight loss stable   INTERVENTION:  Continue osmolite 1.5, 7 cartons daily with 53ml promod BID and 1253ml free water with flushes.    Tube feeding regimen will provide 2685 calories, 124 g protein, 2448ml free water (including from formula and water flushes).   Patient has contact information.    MONITORING, EVALUATION, GOAL: Patient will tolerate tube feeding and promod to minimize weight loss   NEXT VISIT: phone f/u Tuesday, May 26  Emrik Erhard B. Zenia Resides, [place], [place] Registered Dietitian [phone] (pager)

## 2018-10-10 ENCOUNTER — Inpatient Hospital Stay: Payer: BC Managed Care – PPO

## 2018-10-10 ENCOUNTER — Ambulatory Visit
Admission: RE | Admit: 2018-10-10 | Discharge: 2018-10-10 | Disposition: A | Discharge status: Home / Self Care | Payer: BC Managed Care – PPO | Source: Ambulatory Visit | Attending: Radiation Oncology | Admitting: Radiation Oncology

## 2018-10-10 VITALS — BP 100/60 | HR 60 | Temp 98.0°F | Resp 18

## 2018-10-10 DIAGNOSIS — Z95828 Presence of other vascular implants and grafts: Secondary | ICD-10-CM

## 2018-10-10 DIAGNOSIS — C320 Malignant neoplasm of glottis: Secondary | ICD-10-CM | POA: Diagnosis not present

## 2018-10-10 DIAGNOSIS — E860 Dehydration: Secondary | ICD-10-CM

## 2018-10-10 MED ORDER — SODIUM CHLORIDE 0.9 % IV SOLN
Freq: Once | INTRAVENOUS | Status: AC
  Administered 2018-10-10: 09:00:00 via INTRAVENOUS
  Filled 2018-10-10: qty 250

## 2018-10-10 NOTE — Patient Instructions (Signed)
Dehydration, Adult  Dehydration is when there is not enough fluid or water in your body. This happens when you lose more fluids than you take in. Dehydration can range from mild to very bad. It should be treated right away to keep it from getting very bad. Symptoms of mild dehydration may include:  Thirst.  Dry lips.  Slightly dry mouth.  Dry, warm skin.  Dizziness. Symptoms of moderate dehydration may include:  Very dry mouth.  Muscle cramps.  Dark pee (urine). Pee may be the color of tea.  Your body making less pee.  Your eyes making fewer tears.  Heartbeat that is uneven or faster than normal (palpitations).  Headache.  Light-headedness, especially when you stand up from sitting.  Fainting (syncope). Symptoms of very bad dehydration may include:  Changes in skin, such as: ? Cold and clammy skin. ? Blotchy (mottled) or pale skin. ? Skin that does not quickly return to normal after being lightly pinched and let go (poor skin turgor).  Changes in body fluids, such as: ? Feeling very thirsty. ? Your eyes making fewer tears. ? Not sweating when body temperature is high, such as in hot weather. ? Your body making very little pee.  Changes in vital signs, such as: ? Weak pulse. ? Pulse that is more than 100 beats a minute when you are sitting still. ? Fast breathing. ? Low blood pressure.  Other changes, such as: ? Sunken eyes. ? Cold hands and feet. ? Confusion. ? Lack of energy (lethargy). ? Trouble waking up from sleep. ? Short-term weight loss. ? Unconsciousness. Follow these instructions at home:   If told by your doctor, drink an ORS: ? Make an ORS by using instructions on the package. ? Start by drinking small amounts, about  cup (120 mL) every 5-10 minutes. ? Slowly drink more until you have had the amount that your doctor said to have.  Drink enough clear fluid to keep your pee clear or pale yellow. If you were told to drink an ORS, finish the  ORS first, then start slowly drinking clear fluids. Drink fluids such as: ? Water. Do not drink only water by itself. Doing that can make the salt (sodium) level in your body get too low (hyponatremia). ? Ice chips. ? Fruit juice that you have added water to (diluted). ? Low-calorie sports drinks.  Avoid: ? Alcohol. ? Drinks that have a lot of sugar. These include high-calorie sports drinks, fruit juice that does not have water added, and soda. ? Caffeine. ? Foods that are greasy or have a lot of fat or sugar.  Take over-the-counter and prescription medicines only as told by your doctor.  Do not take salt tablets. Doing that can make the salt level in your body get too high (hypernatremia).  Eat foods that have minerals (electrolytes). Examples include bananas, oranges, potatoes, tomatoes, and spinach.  Keep all follow-up visits as told by your doctor. This is important. Contact a doctor if:  You have belly (abdominal) pain that: ? Gets worse. ? Stays in one area (localizes).  You have a rash.  You have a stiff neck.  You get angry or annoyed more easily than normal (irritability).  You are more sleepy than normal.  You have a harder time waking up than normal.  You feel: ? Weak. ? Dizzy. ? Very thirsty.  You have peed (urinated) only a small amount of very dark pee during 6-8 hours. Get help right away if:  You have   symptoms of very bad dehydration.  You cannot drink fluids without throwing up (vomiting).  Your symptoms get worse with treatment.  You have a fever.  You have a very bad headache.  You are throwing up or having watery poop (diarrhea) and it: ? Gets worse. ? Does not go away.  You have blood or something green (bile) in your throw-up.  You have blood in your poop (stool). This may cause poop to look black and tarry.  You have not peed in 6-8 hours.  You pass out (faint).  Your heart rate when you are sitting still is more than 100 beats a  minute.  You have trouble breathing. This information is not intended to replace advice given to you by your health care provider. Make sure you discuss any questions you have with your health care provider. Document Released: 03/05/2009 Document Revised: 11/27/2015 Document Reviewed: 07/03/2015 Elsevier Interactive Patient Education  2019 [employer]vier Inc.  

## 2018-10-11 ENCOUNTER — Other Ambulatory Visit: Payer: Self-pay

## 2018-10-11 ENCOUNTER — Ambulatory Visit
Admission: RE | Admit: 2018-10-11 | Discharge: 2018-10-11 | Disposition: A | Discharge status: Home / Self Care | Payer: BC Managed Care – PPO | Source: Ambulatory Visit | Attending: Radiation Oncology | Admitting: Radiation Oncology

## 2018-10-11 DIAGNOSIS — C320 Malignant neoplasm of glottis: Secondary | ICD-10-CM | POA: Diagnosis not present

## 2018-10-12 ENCOUNTER — Inpatient Hospital Stay: Payer: BC Managed Care – PPO

## 2018-10-12 ENCOUNTER — Ambulatory Visit: Payer: BC Managed Care – PPO

## 2018-10-12 ENCOUNTER — Ambulatory Visit
Admission: RE | Admit: 2018-10-12 | Discharge: 2018-10-12 | Disposition: A | Discharge status: Home / Self Care | Payer: BC Managed Care – PPO | Source: Ambulatory Visit | Attending: Radiation Oncology | Admitting: Radiation Oncology

## 2018-10-12 ENCOUNTER — Telehealth: Payer: Self-pay

## 2018-10-12 ENCOUNTER — Other Ambulatory Visit: Payer: Self-pay

## 2018-10-12 VITALS — BP 122/65 | HR 75 | Temp 98.0°F | Resp 18

## 2018-10-12 DIAGNOSIS — E860 Dehydration: Secondary | ICD-10-CM

## 2018-10-12 DIAGNOSIS — C320 Malignant neoplasm of glottis: Secondary | ICD-10-CM | POA: Diagnosis not present

## 2018-10-12 MED ORDER — SODIUM CHLORIDE 0.9 % IV SOLN
INTRAVENOUS | Status: AC
  Administered 2018-10-12 (×2): via INTRAVENOUS
  Filled 2018-10-12 (×3): qty 250

## 2018-10-15 ENCOUNTER — Ambulatory Visit: Payer: BC Managed Care – PPO

## 2018-10-16 ENCOUNTER — Other Ambulatory Visit: Payer: Self-pay

## 2018-10-16 ENCOUNTER — Encounter: Payer: Self-pay | Admitting: Radiation Oncology

## 2018-10-16 ENCOUNTER — Inpatient Hospital Stay: Payer: BC Managed Care – PPO

## 2018-10-16 ENCOUNTER — Ambulatory Visit (HOSPITAL_COMMUNITY): Payer: BLUE CROSS/BLUE SHIELD

## 2018-10-16 ENCOUNTER — Ambulatory Visit (HOSPITAL_COMMUNITY)
Admission: RE | Admit: 2018-10-16 | Discharge: 2018-10-16 | Disposition: A | Discharge status: Home / Self Care | Payer: BLUE CROSS/BLUE SHIELD | Source: Ambulatory Visit | Attending: Pulmonary Disease | Admitting: Pulmonary Disease

## 2018-10-16 ENCOUNTER — Encounter (HOSPITAL_COMMUNITY): Payer: Self-pay

## 2018-10-16 ENCOUNTER — Ambulatory Visit
Admission: RE | Admit: 2018-10-16 | Discharge: 2018-10-16 | Disposition: A | Discharge status: Home / Self Care | Payer: BC Managed Care – PPO | Source: Ambulatory Visit | Attending: Radiation Oncology | Admitting: Radiation Oncology

## 2018-10-16 ENCOUNTER — Ambulatory Visit: Payer: BC Managed Care – PPO

## 2018-10-16 DIAGNOSIS — C320 Malignant neoplasm of glottis: Secondary | ICD-10-CM | POA: Diagnosis not present

## 2018-10-16 DIAGNOSIS — R911 Solitary pulmonary nodule: Secondary | ICD-10-CM | POA: Insufficient documentation

## 2018-10-16 HISTORY — DX: Malignant (primary) neoplasm, unspecified: C80.1

## 2018-10-16 NOTE — Progress Notes (Signed)
Nutrition Follow-up:  RD working remotely.  Patient with cancer of larynx.  Patient has completed chemotherapy (last week) and last radiation treatment today.  Patient followed by Dr. Irene Limbo and Isidore Moos.   Called patient eariler this am and left message.    Patient returned call this afternoon.  Patient reports that continues tolerating 7 cartons of osmolite 1.5 with 48ml promod BID.  Continues with 1252ml free water.  No issues with tolerating tube feeding per patient.  Patient reports that he has to go get fluids tomorrow and Friday.  Hoping that they can be cut back.  Reports that he is feeling better.     Medications: reviewed  Labs: reviewed from 5/18  Anthropometrics:   No new weight since 5/11 of 134 lb.  Patient reports thinks he weighs 142 lb.    Estimated Energy Needs  Kcals: 3086-5784  Protein: 100-112 g Fluid: 2.3 L  NUTRITION DIAGNOSIS: Unintentional weight loss stable increased per patient report   INTERVENTION:  Continue osmolite 1.5, 7 cartons daily with promod 36ml BID and 125ml free water with flushes.    Tube feeding regimen provides 2685 calories, 124 g protein, 2458ml free water (including free water in formula and water flushes). Patient to contact with questions if needed    MONITORING, EVALUATION, GOAL: Patient will tolerate tube feeding and promod to minimize weight loss   NEXT VISIT: phone f/u on Tuesday, June 9  Ronald Ruiz B. Zenia Resides, [place], [place] Registered Dietitian [phone] (pager)

## 2018-10-16 NOTE — Progress Notes (Signed)
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 Zapien is a 63 year old male with a past medical history including substance abuse and currently on methadone, emphysema, and bronchitis.  The patient was undergoing outpatient evaluation for lung nodules and a vocal cord lesion and presented to the emergency room with worsening dyspnea.  ENT was consulted for his vocal cord mass.  Due to the location of the laryngeal mass, the patient was in danger of developing an upper airway obstruction.  A tracheostomy was placed to protect his airway.  A biopsy of the mass was performed.  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  The patient tells me that he was having persistent upper respiratory symptoms with dyspnea and cough since about November 2019.  He was on several rounds of antibiotics and steroids without significant improvement.  He reports a 64-monthhistory of hoarseness in his voice.  He has lost about 40 pounds over the past 6 months.  He also reports having a sore throat.  Denies difficulty swallowing to admission.  Breathing is better since placement of tracheostomy.  He has not had any fevers or chills.  Denies chest pain.  Denies nausea, vomiting, constipation, diarrhea.  He is on NG tube feeding at this time.  Working with speech therapy on swallowing.  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  The patient is widowed and currently lives alone.  Wife died about 15 months ago secondary to lung cancer. Upon discharge, he [place]  He has 3 daughters who live locally.  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 The patient's last visit with u[place]as on 10/01/18. The pt reports that he is doing well overall.  The pt reports that he has been having some more nausea when he eats in the past week, and notes that he has been using Zofran for this with some relief. He completed RT yesterday. He notes that he has continued getting 100% nutrition through his tube, and has swallowed a few sips of water. Endorses sore throat. He notes that he has been able to continue consuming his food cans well through the PEG tube and met with our nutritional therapist BErnestene Kiel Pt has gained 5 pounds in the interim.   The pt notes that he has been brining up clear to yellow phlegm in the last couple weeks, which he notes worsens after RT. He denies fevers, chills or difficulty breathing. He uses a humidifier at night.  Of note since the patient's last visit, pt has had a CT Chest completed on 10/16/18 with results revealing "Since the prior CT, the patient has developed tree-in-bud type opacities, bronchial wall thickening, and bronchial mucous plugging, in the left lower lobe consistent with infection/inflammation. This is suspicious for MAI infection. The nodule previously measured in left lower lobe on the prior study has decreased in size as has and a smaller adjacent nodule. These findings all support a  benign inflammatory etiology. 2. Nodules right middle lobe, with a dominant nodule measuring 1 cm, are all stable. 3. Stable changes of centrilobular emphysema. 4. Three-vessel coronary artery calcifications and aortic atherosclerosis, also stable. Aortic Atherosclerosis and Emphysema."  Lab results today (10/17/18) of CBC w/diff and CMP is as follows: all values are WNL except for RBC at 2.92, HGB at 9.7, HCT at  30.5, MCV at 104.5, RDW at 17.4, Lymphs abs at 400, CO2 at 35, Glucose at 112, Albumin at 2.7, Total Bilirubin at 200.  On review of systems, pt reports some mild nausea, sore throat, weight gain, stable energy levels, and denies fevers, chills, difficulty breathing, wheezing, and any other symptoms.    MEDICAL HISTORY:  Past Medical History:  Diagnosis Date   Bronchitis 03/2018   COPD (chronic obstructive pulmonary disease) (HCC)    Herpes    laryngeal ca dx'd 06/2018   Lung nodule 2020   Substance abuse ([place])    opiate addiction, been on methadone for 11 years    SURGICAL HISTORY: Past Surgical History:  Procedure Laterality Date   DIRECT LARYNGOSCOPY N/A 07/21/2018   Procedure: DIRECT LARYNGOSCOPY, TRACHEOSTOMY, BIOPSY;  Surgeon: Leta Baptist, MD;  Location: [facility];  Service: ENT;  Laterality: N/A;   IR GASTROSTOMY TUBE MOD SED  08/02/2018    SOCIAL HISTORY: Social History   Socioeconomic History   Marital status: Widowed    Spouse name: Not on file   Number of children: Not on file   Years of education: Not on file   Highest education level: Not on file  Occupational History   Not on file  Social Needs   Financial resource strain: Not on file   Food insecurity:    Worry: Not on file    Inability: Not on file   Transportation needs:    Medical: No    Non-medical: No  Tobacco Use   Smoking status: Former Smoker    Packs/day: 2.00    Years: 50.00    Pack years: 100.00    Types: Cigarettes   Smokeless tobacco: Never Used   Tobacco comment: 07/06/18 at 10 cigs per day, quit 06/16/18  Substance and Sexual Activity   Alcohol use: Not Currently   Drug use: Not Currently    Comment: hx of opiod abuse   Sexual activity: Not Currently    Birth control/protection: None  Lifestyle   Physical activity:    Days per week: Not on file    Minutes per session: Not on file   Stress: Not on file  Relationships   Social connections:    Talks on phone: Not  on file    Gets together: Not on file    Attends religious service: Not on file    Active member of club or organization: Not on file    Attends meetings of clubs or organizations: Not on file    Relationship status: Not on file   Intimate partner violence:    Fear of current or ex partner: No    Emotionally abused: No    Physically abused: No    Forced sexual activity: No  Other Topics Concern   Not on file  Social History Narrative   Not on file    FAMILY HISTORY: Family History  Problem Relation Age of Onset   Alcohol abuse Father    Cancer Father    Cirrhosis Father    Drug abuse Brother     ALLERGIES:  has No Known Allergies.  MEDICATIONS:  Current Outpatient Medications  Medication Sig Dispense Refill   albuterol (PROVENTIL HFA;VENTOLIN HFA) 108 (90 Base) MCG/ACT inhaler Inhale 1-2 puffs into the lungs every 6 (six) hours as needed for wheezing.      budesonide (PULMICORT) 0.5 MG/2ML nebulizer solution INHALE CONTENTS OF 1 VIAL VIA NEBULIZER 2 TIMES DAILY 360 mL 5   dexamethasone (DEXAMETHASONE INTENSOL) 1 MG/ML solution Place 8 mLs (8 mg total) into feeding tube daily. Take 8 mg total by mouth daily starting the day after chemotherapy for 2 days 60 mL 3   ibuprofen (ADVIL,MOTRIN) 100 MG/5ML suspension Place 20-30 mLs (400-600 mg total) into feeding tube every 8 (eight) hours as needed for mild pain. 300 mL 0   ipratropium-albuterol (DUONEB) 0.5-2.5 (3) MG/3ML SOLN TAKE 3 MLS BY NEBULIZATION EVERY 6 (SIX) HOURS AS NEEDED. 1080 mL 2   lidocaine (XYLOCAINE) 2 % solution Patient: Mix 1part 2% viscous lidocaine, 1part H20. Swallow 88m of diluted mixture, 350m before meals and at bedtime, up to QID 100 mL 5   lidocaine-prilocaine (EMLA) cream Apply to affected area once 30 g 3   methadone (DOLOPHINE) 10 MG/ML solution Take 110 mg by mouth daily.     nicotine (NICODERM CQ - DOSED IN MG/24 HOURS) 21 mg/24hr patch Place 1 patch (21 mg total) onto the skin daily.  28 patch 0   Nutritional Supplements (FEEDING SUPPLEMENT, OSMOLITE 1.5 CAL,) LIQD Give 410 ml 4 times daily via feeding tube (7 cartons daily needed) 1659 mL 0   Nutritional Supplements (PROMOD) LIQD Give 30 mL Promod twice daily via PEG. 2 Bottle 2   ondansetron (ZOFRAN) 4 MG/5ML solution Place 10 mLs (8 mg total) into feeding tube 2 (two) times daily as needed for nausea or vomiting. 100 mL 1   PERFOROMIST 20 MCG/2ML nebulizer solution INHALE CONTENTS OF 1 VIAL VIA NEBULIZER 2 TIMES DAILY 360 mL 2   prochlorperazine (COMPAZINE) 10 MG tablet Take 1 tablet (10 mg total) by mouth every 6 (six) hours as needed (Nausea or vomiting). 30 tablet 1   prochlorperazine (COMPAZINE) 5 MG tablet Place 1-2 tablets (5-10 mg total) into feeding tube every 6 (six) hours as needed for nausea or vomiting. (Patient not taking: Reported on 08/20/2018) 30 tablet 0   sucralfate (CARAFATE) 1 GM/10ML suspension TAKE 10 MLS (1 G TOTAL) BY MOUTH 2 (TWO) TIMES DAILY. 420 mL 0   Water For Irrigation, Sterile (FREE WATER) SOLN Place 300 mLs into feeding tube every 8 (eight) hours. 5000 mL 0   Current Facility-Administered Medications  Medication Dose Route Frequency Provider Last Rate Last Dose   0.9 %  sodium chloride infusion   Intravenous Continuous KaIrene LimboGaCloria SpringMD        REVIEW OF SYSTEMS:    A 10+ POINT REVIEW OF SYSTEMS WAS OBTAINED including neurology, dermatology, psychiatry, cardiac, respiratory, lymph, extremities, GI, GU, Musculoskeletal, constitutional, breasts, reproductive, HEENT.  All pertinent positives are noted in the HPI.  All others are negative.   PHYSICAL EXAMINATION: ECOG PERFORMANCE STATUS: 1 - Symptomatic but completely ambulatory  Vitals:   10/17/18 1312  BP: 112/76  Pulse: 79  Resp: 18  Temp: 98.9 F (37.2 C)  SpO2: 99%   Filed Weights   10/17/18 1312  Weight: 139 lb 12.8 oz (63.4 kg)   .Body mass index is 18.96 kg/m.  GENERAL:alert, in no acute distress and  comfortable SKIN: no acute rashes, no significant lesions EYES: conjunctiva are pink and non-injected, sclera anicteric OROPHARYNX: MMM, no exudates, no oropharyngeal  erythema or ulceration NECK: supple, no JVD. Tracheostomy in situ. LYMPH:  no palpable lymphadenopathy in the cervical, axillary or inguinal regions LUNGS: clear to auscultation b/l with normal respiratory effort HEART: regular rate & rhythm ABDOMEN:  normoactive bowel sounds , non tender, not distended. No palpable hepatosplenomegaly. PEG tube in situ Extremity: no pedal edema PSYCH: alert & oriented x 3 with fluent speech NEURO: no focal motor/sensory deficits   LABORATORY DATA:  I have reviewed the data as listed  . CBC Latest Ref Rng & Units 10/17/2018 10/08/2018 10/01/2018  WBC 4.0 - 10.5 K/uL 4.3 1.7(L) 2.6(L)  Hemoglobin 13.0 - 17.0 g/dL 9.7(L) 8.9(L) 11.3(L)  Hematocrit 39.0 - 52.0 % 30.5(L) 28.0(L) 34.4(L)  Platelets 150 - 400 K/uL 188 120(L) 118(L)    . CMP Latest Ref Rng & Units 10/17/2018 10/08/2018 10/01/2018  Glucose 70 - 99 mg/dL 112(H) 86 96  BUN 8 - 23 mg/dL '17 15 16  '$ Creatinine 0.61 - 1.24 mg/dL 0.75 0.69 0.77  Sodium 135 - 145 mmol/L 141 140 140  Potassium 3.5 - 5.1 mmol/L 4.2 4.3 4.3  Chloride 98 - 111 mmol/L 98 100 101  CO2 22 - 32 mmol/L 35(H) 34(H) 28  Calcium 8.9 - 10.3 mg/dL 9.5 9.3 9.5  Total Protein 6.5 - 8.1 g/dL 7.0 6.5 7.5  Total Bilirubin 0.3 - 1.2 mg/dL 0.2(L) 0.2(L) 0.5  Alkaline Phos 38 - 126 U/L 66 57 69  AST 15 - 41 U/L '15 15 18  '$ ALT 0 - 44 U/L '14 15 19     '$ RADIOGRAPHIC STUDIES: I have personally reviewed the radiological images as listed and agreed with the findings in the report. Ct Chest Wo Contrast  Result Date: 10/16/2018 CLINICAL DATA:  Laryngeal carcinoma diagnosed in February 2020. Status post chemotherapy and radiation therapy. EXAM: CT CHEST WITHOUT CONTRAST TECHNIQUE: Multidetector CT imaging of the chest was performed following the standard protocol without IV  contrast. COMPARISON:  Chest radiograph, 07/21/2018. PET-CT, 07/17/2018 and chest CT, 07/03/2018. FINDINGS: Cardiovascular: Heart is normal in size. No pericardial effusion. There are three-vessel coronary artery calcifications and mild aortic atherosclerotic calcifications. Mediastinum/Nodes: Heterogeneous thyroid consistent with multiple nodules, most not well-defined, largest discrete nodule containing a calcification and measuring 2.8 cm in long axis. No neck base or axillary adenopathy. No mediastinal or hilar masses or enlarged lymph nodes. Trachea and esophagus are unremarkable. Tracheostomy tube is well positioned. Lungs/Pleura: On the right, reticulonodular opacities in the right middle lobe are unchanged, with a dominant nodule measuring 1 cm in long axis. On the left, there are new tree-in-bud type opacities in the left lower lobe associated with bronchial wall thickening and bronchial opacification consistent with mucoid impaction. The previously measured nodule is smaller, currently centered on image 140, series 7, measuring 8 x 3 mm, previously 11 x 5 mm. Another discrete nodule inferior to this, left lower lobe, image 144, measures 4 mm, previously 5 mm. There is stable a pole pleuroparenchymal scarring. Changes of mild centrilobular emphysema are again noted, also unchanged. No pleural effusion or pneumothorax. Upper Abdomen: No acute findings.  Well-positioned gastrostomy tube. Musculoskeletal: No fracture or acute finding. No osteoblastic or osteolytic lesions. IMPRESSION: 1. Since the prior CT, the patient has developed tree-in-bud type opacities, bronchial wall thickening, and bronchial mucous plugging, in the left lower lobe consistent with infection/inflammation. This is suspicious for MAI infection. The nodule previously measured in left lower lobe on the prior study has decreased in size as has and a smaller adjacent nodule. These  findings all support a benign inflammatory etiology. 2.  Nodules right middle lobe, with a dominant nodule measuring 1 cm, are all stable. 3. Stable changes of centrilobular emphysema. 4. Three-vessel coronary artery calcifications and aortic atherosclerosis, also stable. Aortic Atherosclerosis (ICD10-I70.0) and Emphysema (ICD10-J43.9). Electronically Signed   By: Lajean Manes M.D.   On: 10/16/2018 15:57    ASSESSMENT & PLAN:   63 y.o. male with  1. 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  Discussed with Dr. Benjamine Mola at bedside patient is not a candidate for larynx sparing surgery. He has lost about 40 pounds and is nutritionally quite depleted. Heavy smoking history of 1 to 2 packs/day with COPD /emphysema.  Not on home oxygen. 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 Has tolerated his tracheostomy well and is been able to speak with Passy-Muir valve. Has had issues with swallowing and is still needing tube feeding.  07/17/18 PET/CT revealed he has multiple pulmonary nodules none of them are FDG avid. No FDG avid cervical lymphadenopathy. FDG avid laryngeal tumor noted.  PLAN: -Discussed pt labwork today, 10/17/18; Albumin stable, WBC normalized, PLT normalized. HGB improved to 9.7 -Discussed the 10/16/18 CT Chest which revealed "Since the prior CT, the patient has developed tree-in-bud type opacities, bronchial wall thickening, and bronchial mucous plugging, in the left lower lobe consistent with infection/inflammation. This is suspicious for MAI infection. The nodule previously measured in left lower lobe on the prior study has decreased in size as has and a smaller adjacent nodule. These findings all support a benign inflammatory etiology. 2. Nodules right middle lobe, with a dominant nodule measuring 1 cm, are all stable. 3. Stable changes of  centrilobular emphysema. 4. Three-vessel coronary artery calcifications and aortic atherosclerosis, also stable. Aortic Atherosclerosis and Emphysema." -Will begin empiric course of antibiotics -Continue follow up with pulmonology, could consider flutter valve -Stressed the continued importance of nutritional optimization and maintaining hydration status -Will now decrease to IVF twice a week, and pt will increase hydration status through tub -The pt has no prohibitive toxicities after completing 6 weekly doses of Carboplatin and Taxol with concurrent RT -Continue Sucralfate, Biotene, Lidocaine, and salt and baking soda mouthwashes -Follow up with our head and neck nurse navigator Gayleen Orem -Following with Dr Isidore Moos in [place] with absolute smoking cessation -Will see the pt back in 2 weeks   Reduce IVF to Monday and Friday for 2 weeks RTC with Dr Irene Limbo in 2 weeks with labs   All of the patients questions were answered with apparent satisfaction. The patient knows to call the clinic with any problems, questions or concerns.  The total time spent in the appt was 20 minutes and more than 50% was on counseling and direct patient cares.    Sullivan Lone MD MS AAHIVMS [facility] Hematology/Oncology Physician [facility]  (Office):       [phone] (Work cell):  [phone] (Fax):           [phone]  10/17/2018 1:38 PM  I, Baldwin Jamaica, am acting as a scribe for Dr. Sullivan Lone.   .I have reviewed the above documentation for accuracy and completeness, and I agree with the above. [name] MD

## 2018-10-17 ENCOUNTER — Inpatient Hospital Stay: Payer: BC Managed Care – PPO

## 2018-10-17 ENCOUNTER — Inpatient Hospital Stay (HOSPITAL_BASED_OUTPATIENT_CLINIC_OR_DEPARTMENT_OTHER): Payer: BC Managed Care – PPO | Admitting: Hematology

## 2018-10-17 ENCOUNTER — Other Ambulatory Visit: Payer: Self-pay

## 2018-10-17 ENCOUNTER — Ambulatory Visit: Payer: BC Managed Care – PPO

## 2018-10-17 VITALS — BP 112/76 | HR 79 | Temp 98.9°F | Resp 18 | Ht 72.0 in | Wt 139.8 lb

## 2018-10-17 DIAGNOSIS — D696 Thrombocytopenia, unspecified: Secondary | ICD-10-CM | POA: Diagnosis not present

## 2018-10-17 DIAGNOSIS — Z79899 Other long term (current) drug therapy: Secondary | ICD-10-CM | POA: Diagnosis not present

## 2018-10-17 DIAGNOSIS — R197 Diarrhea, unspecified: Secondary | ICD-10-CM

## 2018-10-17 DIAGNOSIS — D72819 Decreased white blood cell count, unspecified: Secondary | ICD-10-CM | POA: Diagnosis not present

## 2018-10-17 DIAGNOSIS — Z5111 Encounter for antineoplastic chemotherapy: Secondary | ICD-10-CM | POA: Diagnosis not present

## 2018-10-17 DIAGNOSIS — R110 Nausea: Secondary | ICD-10-CM

## 2018-10-17 DIAGNOSIS — Z87891 Personal history of nicotine dependence: Secondary | ICD-10-CM | POA: Diagnosis not present

## 2018-10-17 DIAGNOSIS — Z7189 Other specified counseling: Secondary | ICD-10-CM

## 2018-10-17 DIAGNOSIS — R634 Abnormal weight loss: Secondary | ICD-10-CM

## 2018-10-17 DIAGNOSIS — J029 Acute pharyngitis, unspecified: Secondary | ICD-10-CM | POA: Diagnosis not present

## 2018-10-17 DIAGNOSIS — J449 Chronic obstructive pulmonary disease, unspecified: Secondary | ICD-10-CM

## 2018-10-17 DIAGNOSIS — I700 Atherosclerosis of aorta: Secondary | ICD-10-CM | POA: Diagnosis not present

## 2018-10-17 DIAGNOSIS — F1111 Opioid abuse, in remission: Secondary | ICD-10-CM | POA: Diagnosis not present

## 2018-10-17 DIAGNOSIS — E860 Dehydration: Secondary | ICD-10-CM

## 2018-10-17 DIAGNOSIS — C320 Malignant neoplasm of glottis: Secondary | ICD-10-CM

## 2018-10-17 DIAGNOSIS — Z930 Tracheostomy status: Secondary | ICD-10-CM | POA: Diagnosis not present

## 2018-10-17 DIAGNOSIS — D649 Anemia, unspecified: Secondary | ICD-10-CM

## 2018-10-17 DIAGNOSIS — B009 Herpesviral infection, unspecified: Secondary | ICD-10-CM

## 2018-10-17 DIAGNOSIS — R911 Solitary pulmonary nodule: Secondary | ICD-10-CM | POA: Diagnosis not present

## 2018-10-17 DIAGNOSIS — R1111 Vomiting without nausea: Secondary | ICD-10-CM

## 2018-10-17 DIAGNOSIS — E440 Moderate protein-calorie malnutrition: Secondary | ICD-10-CM

## 2018-10-17 LAB — CBC WITH DIFFERENTIAL/PLATELET
Abs Immature Granulocytes: 0.02 10*3/uL (ref 0.00–0.07)
Basophils Absolute: 0 10*3/uL (ref 0.0–0.1)
Basophils Relative: 1 %
Eosinophils Absolute: 0 10*3/uL (ref 0.0–0.5)
Eosinophils Relative: 1 %
HCT: 30.5 % — ABNORMAL LOW (ref 39.0–52.0)
Hemoglobin: 9.7 g/dL — ABNORMAL LOW (ref 13.0–17.0)
Immature Granulocytes: 1 %
Lymphocytes Relative: 10 %
Lymphs Abs: 0.4 10*3/uL — ABNORMAL LOW (ref 0.7–4.0)
MCH: 33.2 pg (ref 26.0–34.0)
MCHC: 31.8 g/dL (ref 30.0–36.0)
MCV: 104.5 fL — ABNORMAL HIGH (ref 80.0–100.0)
Monocytes Absolute: 0.7 10*3/uL (ref 0.1–1.0)
Monocytes Relative: 16 %
Neutro Abs: 3.1 10*3/uL (ref 1.7–7.7)
Neutrophils Relative %: 71 %
Platelets: 188 10*3/uL (ref 150–400)
RBC: 2.92 MIL/uL — ABNORMAL LOW (ref 4.22–5.81)
RDW: 17.4 % — ABNORMAL HIGH (ref 11.5–15.5)
WBC: 4.3 10*3/uL (ref 4.0–10.5)
nRBC: 0 % (ref 0.0–0.2)

## 2018-10-17 LAB — COMPREHENSIVE METABOLIC PANEL
ALT: 14 U/L (ref 0–44)
AST: 15 U/L (ref 15–41)
Albumin: 2.7 g/dL — ABNORMAL LOW (ref 3.5–5.0)
Alkaline Phosphatase: 66 U/L (ref 38–126)
Anion gap: 8 (ref 5–15)
BUN: 17 mg/dL (ref 8–23)
CO2: 35 mmol/L — ABNORMAL HIGH (ref 22–32)
Calcium: 9.5 mg/dL (ref 8.9–10.3)
Chloride: 98 mmol/L (ref 98–111)
Creatinine, Ser: 0.75 mg/dL (ref 0.61–1.24)
GFR calc Af Amer: >60 mL/min (ref >60)
GFR calc non Af Amer: >60 mL/min (ref >60)
Glucose, Bld: 112 mg/dL — ABNORMAL HIGH (ref 70–99)
Potassium: 4.2 mmol/L (ref 3.5–5.1)
Sodium: 141 mmol/L (ref 135–145)
Total Bilirubin: 0.2 mg/dL — ABNORMAL LOW (ref 0.3–1.2)
Total Protein: 7 g/dL (ref 6.5–8.1)

## 2018-10-17 MED ORDER — SODIUM CHLORIDE 0.9 % IV SOLN
INTRAVENOUS | Status: AC
  Administered 2018-10-17 (×2): via INTRAVENOUS
  Filled 2018-10-17 (×3): qty 250

## 2018-10-17 NOTE — Patient Instructions (Signed)
 Dehydration, Adult  Dehydration is when there is not enough fluid or water in your body. This happens when you lose more fluids than you take in. Dehydration can range from mild to very bad. It should be treated right away to keep it from getting very bad. Symptoms of mild dehydration may include:  Thirst.  Dry lips.  Slightly dry mouth.  Dry, warm skin.  Dizziness. Symptoms of moderate dehydration may include:  Very dry mouth.  Muscle cramps.  Dark pee (urine). Pee may be the color of tea.  Your body making less pee.  Your eyes making fewer tears.  Heartbeat that is uneven or faster than normal (palpitations).  Headache.  Light-headedness, especially when you stand up from sitting.  Fainting (syncope). Symptoms of very bad dehydration may include:  Changes in skin, such as: ? Cold and clammy skin. ? Blotchy (mottled) or pale skin. ? Skin that does not quickly return to normal after being lightly pinched and let go (poor skin turgor).  Changes in body fluids, such as: ? Feeling very thirsty. ? Your eyes making fewer tears. ? Not sweating when body temperature is high, such as in hot weather. ? Your body making very little pee.  Changes in vital signs, such as: ? Weak pulse. ? Pulse that is more than 100 beats a minute when you are sitting still. ? Fast breathing. ? Low blood pressure.  Other changes, such as: ? Sunken eyes. ? Cold hands and feet. ? Confusion. ? Lack of energy (lethargy). ? Trouble waking up from sleep. ? Short-term weight loss. ? Unconsciousness. Follow these instructions at home:   If told by your doctor, drink an ORS: ? Make an ORS by using instructions on the package. ? Start by drinking small amounts, about  cup (120 mL) every 5-10 minutes. ? Slowly drink more until you have had the amount that your doctor said to have.  Drink enough clear fluid to keep your pee clear or pale yellow. If you were told to drink an ORS, finish  the ORS first, then start slowly drinking clear fluids. Drink fluids such as: ? Water. Do not drink only water by itself. Doing that can make the salt (sodium) level in your body get too low (hyponatremia). ? Ice chips. ? Fruit juice that you have added water to (diluted). ? Low-calorie sports drinks.  Avoid: ? Alcohol. ? Drinks that have a lot of sugar. These include high-calorie sports drinks, fruit juice that does not have water added, and soda. ? Caffeine. ? Foods that are greasy or have a lot of fat or sugar.  Take over-the-counter and prescription medicines only as told by your doctor.  Do not take salt tablets. Doing that can make the salt level in your body get too high (hypernatremia).  Eat foods that have minerals (electrolytes). Examples include bananas, oranges, potatoes, tomatoes, and spinach.  Keep all follow-up visits as told by your doctor. This is important. Contact a doctor if:  You have belly (abdominal) pain that: ? Gets worse. ? Stays in one area (localizes).  You have a rash.  You have a stiff neck.  You get angry or annoyed more easily than normal (irritability).  You are more sleepy than normal.  You have a harder time waking up than normal.  You feel: ? Weak. ? Dizzy. ? Very thirsty.  You have peed (urinated) only a small amount of very dark pee during 6-8 hours. Get help right away if:  You   have symptoms of very bad dehydration.  You cannot drink fluids without throwing up (vomiting).  Your symptoms get worse with treatment.  You have a fever.  You have a very bad headache.  You are throwing up or having watery poop (diarrhea) and it: ? Gets worse. ? Does not go away.  You have blood or something green (bile) in your throw-up.  You have blood in your poop (stool). This may cause poop to look black and tarry.  You have not peed in 6-8 hours.  You pass out (faint).  Your heart rate when you are sitting still is more than 100  beats a minute.  You have trouble breathing. This information is not intended to replace advice given to you by your health care provider. Make sure you discuss any questions you have with your health care provider. Document Released: 03/05/2009 Document Revised: 11/27/2015 Document Reviewed: 07/03/2015 Elsevier Interactive Patient Education  2019 Elsevier Inc.  Coronavirus (COVID-19) Are you at risk?  Are you at risk for the Coronavirus (COVID-19)?  To be considered HIGH RISK for Coronavirus (COVID-19), you have to meet the following criteria:  . Traveled to [place], [place], [place], [place] or [place]; or in the [place] to [place], [place], [place], or [place]; and have fever, cough, and shortness of breath within the last 2 weeks of travel OR . Been in close contact with a person diagnosed with COVID-19 within the last 2 weeks and have fever, cough, and shortness of breath . IF YOU DO NOT MEET THESE CRITERIA, YOU ARE CONSIDERED LOW RISK FOR COVID-19.  What to do if you are HIGH RISK for COVID-19?  . If you are having a medical emergency, call 911. . Seek medical care right away. Before you go to a doctor's office, urgent care or emergency department, call ahead and tell them about your recent travel, contact with someone diagnosed with COVID-19, and your symptoms. You should receive instructions from your physician's office regarding next steps of care.  . When you arrive at healthcare provider, tell the healthcare staff immediately you have returned from visiting [place], [place], [place], [place] or [place]; or traveled in the [place] to [place], [place], [place], or [place]; in the last two weeks or you have been in close contact with a person diagnosed with COVID-19 in the last 2 weeks.   . Tell the health care staff about your symptoms: fever, cough and shortness of breath. . After you have been seen by a medical provider, you will be either: o Tested for  (COVID-19) and discharged home on quarantine except to seek medical care if symptoms worsen, and asked to  - Stay home and avoid contact with others until you get your results (4-5 days)  - Avoid travel on public transportation if possible (such as bus, train, or airplane) or o Sent to the Emergency Department by EMS for evaluation, COVID-19 testing, and possible admission depending on your condition and test results.  What to do if you are LOW RISK for COVID-19?  Reduce your risk of any infection by using the same precautions used for avoiding the common cold or flu:  . Wash your hands often with soap and warm water for at least 20 seconds.  If soap and water are not readily available, use an alcohol-based hand sanitizer with at least 60% alcohol.  . If coughing or sneezing, cover your mouth and nose by coughing or sneezing into the elbow areas   of your shirt or coat, into a tissue or into your sleeve (not your hands). . Avoid shaking hands with others and consider head nods or verbal greetings only. . Avoid touching your eyes, nose, or mouth with unwashed hands.  . Avoid close contact with people who are sick. . Avoid places or events with large numbers of people in one location, like concerts or sporting events. . Carefully consider travel plans you have or are making. . If you are planning any travel outside or inside the [place], visit the CDC's Travelers' Health webpage for the latest health notices. . If you have some symptoms but not all symptoms, continue to monitor at home and seek medical attention if your symptoms worsen. . If you are having a medical emergency, call 911.   ADDITIONAL HEALTHCARE OPTIONS FOR PATIENTS  [facility] / e-Visit: [email or web address]         [facility]: [phone]  [facility]: [phone]                   [facility]: [phone]  

## 2018-10-18 ENCOUNTER — Other Ambulatory Visit: Payer: Self-pay | Admitting: Hematology

## 2018-10-18 ENCOUNTER — Ambulatory Visit: Payer: BC Managed Care – PPO

## 2018-10-18 ENCOUNTER — Telehealth: Payer: Self-pay | Admitting: *Deleted

## 2018-10-18 ENCOUNTER — Telehealth: Payer: Self-pay | Admitting: Hematology

## 2018-10-18 MED ORDER — DOXYCYCLINE HYCLATE 100 MG PO TABS: 100.0000 mg | ORAL_TABLET | Freq: Two times a day (BID) | ORAL | 0 refills | Status: AC

## 2018-10-18 NOTE — Telephone Encounter (Signed)
Sent.thx GK

## 2018-10-18 NOTE — Telephone Encounter (Signed)
Scheduled appt per 5/27 los.

## 2018-10-18 NOTE — Telephone Encounter (Signed)
Patient left voice mail stating Dr.Kale was sending a prescription for an antibiotic to the pharmacy yesterday. They haven't received anything. Message give to Dr.Kale.

## 2018-10-18 NOTE — Progress Notes (Unsigned)
doxycy

## 2018-10-19 ENCOUNTER — Ambulatory Visit: Payer: BC Managed Care – PPO

## 2018-10-19 ENCOUNTER — Inpatient Hospital Stay: Payer: BC Managed Care – PPO

## 2018-10-22 ENCOUNTER — Inpatient Hospital Stay: Payer: BC Managed Care – PPO | Attending: Hematology

## 2018-10-22 ENCOUNTER — Other Ambulatory Visit: Payer: Self-pay | Admitting: Hematology

## 2018-10-22 ENCOUNTER — Inpatient Hospital Stay: Payer: BC Managed Care – PPO

## 2018-10-22 ENCOUNTER — Other Ambulatory Visit: Payer: Self-pay

## 2018-10-22 ENCOUNTER — Ambulatory Visit: Payer: BC Managed Care – PPO

## 2018-10-22 VITALS — BP 109/54 | HR 57 | Temp 98.9°F | Resp 18

## 2018-10-22 DIAGNOSIS — R51 Headache: Secondary | ICD-10-CM | POA: Insufficient documentation

## 2018-10-22 DIAGNOSIS — J449 Chronic obstructive pulmonary disease, unspecified: Secondary | ICD-10-CM | POA: Insufficient documentation

## 2018-10-22 DIAGNOSIS — Z9221 Personal history of antineoplastic chemotherapy: Secondary | ICD-10-CM | POA: Diagnosis not present

## 2018-10-22 DIAGNOSIS — Z87891 Personal history of nicotine dependence: Secondary | ICD-10-CM | POA: Insufficient documentation

## 2018-10-22 DIAGNOSIS — E860 Dehydration: Secondary | ICD-10-CM

## 2018-10-22 DIAGNOSIS — I700 Atherosclerosis of aorta: Secondary | ICD-10-CM | POA: Insufficient documentation

## 2018-10-22 DIAGNOSIS — I2510 Atherosclerotic heart disease of native coronary artery without angina pectoris: Secondary | ICD-10-CM | POA: Insufficient documentation

## 2018-10-22 DIAGNOSIS — E440 Moderate protein-calorie malnutrition: Secondary | ICD-10-CM | POA: Insufficient documentation

## 2018-10-22 DIAGNOSIS — Z923 Personal history of irradiation: Secondary | ICD-10-CM | POA: Insufficient documentation

## 2018-10-22 DIAGNOSIS — R634 Abnormal weight loss: Secondary | ICD-10-CM | POA: Diagnosis not present

## 2018-10-22 DIAGNOSIS — F1121 Opioid dependence, in remission: Secondary | ICD-10-CM | POA: Diagnosis not present

## 2018-10-22 DIAGNOSIS — Z79899 Other long term (current) drug therapy: Secondary | ICD-10-CM | POA: Insufficient documentation

## 2018-10-22 DIAGNOSIS — Z7189 Other specified counseling: Secondary | ICD-10-CM

## 2018-10-22 DIAGNOSIS — C320 Malignant neoplasm of glottis: Secondary | ICD-10-CM | POA: Diagnosis not present

## 2018-10-22 DIAGNOSIS — R110 Nausea: Secondary | ICD-10-CM | POA: Diagnosis not present

## 2018-10-22 LAB — COMPREHENSIVE METABOLIC PANEL
ALT: 14 U/L (ref 0–44)
AST: 16 U/L (ref 15–41)
Albumin: 2.8 g/dL — ABNORMAL LOW (ref 3.5–5.0)
Alkaline Phosphatase: 74 U/L (ref 38–126)
Anion gap: 11 (ref 5–15)
BUN: 16 mg/dL (ref 8–23)
CO2: 31 mmol/L (ref 22–32)
Calcium: 9.8 mg/dL (ref 8.9–10.3)
Chloride: 96 mmol/L — ABNORMAL LOW (ref 98–111)
Creatinine, Ser: 0.78 mg/dL (ref 0.61–1.24)
GFR calc Af Amer: >60 mL/min (ref >60)
GFR calc non Af Amer: >60 mL/min (ref >60)
Glucose, Bld: 100 mg/dL — ABNORMAL HIGH (ref 70–99)
Potassium: 4.1 mmol/L (ref 3.5–5.1)
Sodium: 138 mmol/L (ref 135–145)
Total Bilirubin: 0.4 mg/dL (ref 0.3–1.2)
Total Protein: 7.6 g/dL (ref 6.5–8.1)

## 2018-10-22 LAB — CBC WITH DIFFERENTIAL/PLATELET
Abs Immature Granulocytes: 0.08 10*3/uL — ABNORMAL HIGH (ref 0.00–0.07)
Basophils Absolute: 0.1 10*3/uL (ref 0.0–0.1)
Basophils Relative: 0 %
Eosinophils Absolute: 0.1 10*3/uL (ref 0.0–0.5)
Eosinophils Relative: 1 %
HCT: 33.3 % — ABNORMAL LOW (ref 39.0–52.0)
Hemoglobin: 10.7 g/dL — ABNORMAL LOW (ref 13.0–17.0)
Immature Granulocytes: 1 %
Lymphocytes Relative: 5 %
Lymphs Abs: 0.7 10*3/uL (ref 0.7–4.0)
MCH: 33.6 pg (ref 26.0–34.0)
MCHC: 32.1 g/dL (ref 30.0–36.0)
MCV: 104.7 fL — ABNORMAL HIGH (ref 80.0–100.0)
Monocytes Absolute: 1.3 10*3/uL — ABNORMAL HIGH (ref 0.1–1.0)
Monocytes Relative: 9 %
Neutro Abs: 11.9 10*3/uL — ABNORMAL HIGH (ref 1.7–7.7)
Neutrophils Relative %: 84 %
Platelets: 229 10*3/uL (ref 150–400)
RBC: 3.18 MIL/uL — ABNORMAL LOW (ref 4.22–5.81)
RDW: 17.6 % — ABNORMAL HIGH (ref 11.5–15.5)
WBC: 14.1 10*3/uL — ABNORMAL HIGH (ref 4.0–10.5)
nRBC: 0 % (ref 0.0–0.2)

## 2018-10-22 MED ORDER — SODIUM CHLORIDE 0.9 % IV SOLN
Freq: Once | INTRAVENOUS | Status: AC
  Administered 2018-10-22: 11:00:00 via INTRAVENOUS
  Filled 2018-10-22: qty 250

## 2018-10-22 MED ORDER — SODIUM CHLORIDE 0.9 % IV SOLN
INTRAVENOUS | Status: AC
  Filled 2018-10-22: qty 250

## 2018-10-22 NOTE — Patient Instructions (Signed)
Coronavirus (COVID-19) Are you at risk?  Are you at risk for the Coronavirus (COVID-19)?  To be considered HIGH RISK for Coronavirus (COVID-19), you have to meet the following criteria:  . Traveled to [place], [place], [place], [place] or [place]; or in the [place] to [place], [place], [place], or [place]; and have fever, cough, and shortness of breath within the last 2 weeks of travel OR . Been in close contact with a person diagnosed with COVID-19 within the last 2 weeks and have fever, cough, and shortness of breath . IF YOU DO NOT MEET THESE CRITERIA, YOU ARE CONSIDERED LOW RISK FOR COVID-19.  What to do if you are HIGH RISK for COVID-19?  . If you are having a medical emergency, call 911. . Seek medical care right away. Before you go to a doctor's office, urgent care or emergency department, call ahead and tell them about your recent travel, contact with someone diagnosed with COVID-19, and your symptoms. You should receive instructions from your physician's office regarding next steps of care.  . When you arrive at healthcare provider, tell the healthcare staff immediately you have returned from visiting [place], [place], [place], [place] or [place]; or traveled in the [place] to [place], [place], [place], or [place]; in the last two weeks or you have been in close contact with a person diagnosed with COVID-19 in the last 2 weeks.   . Tell the health care staff about your symptoms: fever, cough and shortness of breath. . After you have been seen by a medical provider, you will be either: o Tested for (COVID-19) and discharged home on quarantine except to seek medical care if symptoms worsen, and asked to  - Stay home and avoid contact with others until you get your results (4-5 days)  - Avoid travel on public transportation if possible (such as bus, train, or airplane) or o Sent to the Emergency Department by EMS for evaluation, COVID-19 testing, and possible  admission depending on your condition and test results.  What to do if you are LOW RISK for COVID-19?  Reduce your risk of any infection by using the same precautions used for avoiding the common cold or flu:  . Wash your hands often with soap and warm water for at least 20 seconds.  If soap and water are not readily available, use an alcohol-based hand sanitizer with at least 60% alcohol.  . If coughing or sneezing, cover your mouth and nose by coughing or sneezing into the elbow areas of your shirt or coat, into a tissue or into your sleeve (not your hands). . Avoid shaking hands with others and consider head nods or verbal greetings only. . Avoid touching your eyes, nose, or mouth with unwashed hands.  . Avoid close contact with people who are sick. . Avoid places or events with large numbers of people in one location, like concerts or sporting events. . Carefully consider travel plans you have or are making. . If you are planning any travel outside or inside the [place], visit the CDC's Travelers' Health webpage for the latest health notices. . If you have some symptoms but not all symptoms, continue to monitor at home and seek medical attention if your symptoms worsen. . If you are having a medical emergency, call 911.   ADDITIONAL HEALTHCARE OPTIONS FOR PATIENTS  [facility] / e-Visit: [email or web address]         [facility]: [phone]  [facility]: [phone]                   [facility]: [phone]    Dehydration, Adult  Dehydration is a condition in which there is not enough fluid or water in the body. This happens when you lose more fluids than you take in. Important organs, such as the kidneys, brain, and heart, cannot function without a proper amount of fluids. Any loss of fluids from the body can lead to dehydration. Dehydration can range from mild to severe. This condition  should be treated right away to prevent it from becoming severe. What are the causes? This condition may be caused by:  Vomiting.  Diarrhea.  Excessive sweating, such as from heat exposure or exercise.  Not drinking enough fluid, especially: ? When ill. ? While doing activity that requires a lot of energy.  Excessive urination.  Fever.  Infection.  Certain medicines, such as medicines that cause the body to lose excess fluid (diuretics).  Inability to access safe drinking water.  Reduced physical ability to get adequate water and food. What increases the risk? This condition is more likely to develop in people:  Who have a poorly controlled long-term (chronic) illness, such as diabetes, heart disease, or kidney disease.  Who are age 65 or older.  Who are disabled.  Who live in a place with high altitude.  Who play endurance sports. What are the signs or symptoms? Symptoms of mild dehydration may include:  Thirst.  Dry lips.  Slightly dry mouth.  Dry, warm skin.  Dizziness. Symptoms of moderate dehydration may include:  Very dry mouth.  Muscle cramps.  Dark urine. Urine may be the color of tea.  Decreased urine production.  Decreased tear production.  Heartbeat that is irregular or faster than normal (palpitations).  Headache.  Light-headedness, especially when you stand up from a sitting position.  Fainting (syncope). Symptoms of severe dehydration may include:  Changes in skin, such as: ? Cold and clammy skin. ? Blotchy (mottled) or pale skin. ? Skin that does not quickly return to normal after being lightly pinched and released (poor skin turgor).  Changes in body fluids, such as: ? Extreme thirst. ? No tear production. ? Inability to sweat when body temperature is high, such as in hot weather. ? Very little urine production.  Changes in vital signs, such as: ? Weak pulse. ? Pulse that is more than 100 beats a minute when sitting  still. ? Rapid breathing. ? Low blood pressure.  Other changes, such as: ? Sunken eyes. ? Cold hands and feet. ? Confusion. ? Lack of energy (lethargy). ? Difficulty waking up from sleep. ? Short-term weight loss. ? Unconsciousness. How is this diagnosed? This condition is diagnosed based on your symptoms and a physical exam. Blood and urine tests may be done to help confirm the diagnosis. How is this treated? Treatment for this condition depends on the severity. Mild or moderate dehydration can often be treated at home. Treatment should be started right away. Do not wait until dehydration becomes severe. Severe dehydration is an emergency and it needs to be treated in a hospital. Treatment for mild dehydration may include:  Drinking more fluids.  Replacing salts and minerals in your blood (electrolytes) that you may have lost. Treatment for moderate dehydration may include:  Drinking an oral rehydration solution (ORS). This is a drink that helps you replace fluids and electrolytes (rehydrate). It can be found at pharmacies and retail   stores. Treatment for severe dehydration may include:  Receiving fluids through an IV tube.  Receiving an electrolyte solution through a feeding tube that is passed through your nose and into your stomach (nasogastric tube, or NG tube).  Correcting any abnormalities in electrolytes.  Treating the underlying cause of dehydration. Follow these instructions at home:  If directed by your health care provider, drink an ORS: ? Make an ORS by following instructions on the package. ? Start by drinking small amounts, about  cup (120 mL) every 5-10 minutes. ? Slowly increase how much you drink until you have taken the amount recommended by your health care provider.  Drink enough clear fluid to keep your urine clear or pale yellow. If you were told to drink an ORS, finish the ORS first, then start slowly drinking other clear fluids. Drink fluids such as:  ? Water. Do not drink only water. Doing that can lead to having too little salt (sodium) in the body (hyponatremia). ? Ice chips. ? Fruit juice that you have added water to (diluted fruit juice). ? Low-calorie sports drinks.  Avoid: ? Alcohol. ? Drinks that contain a lot of sugar. These include high-calorie sports drinks, fruit juice that is not diluted, and soda. ? Caffeine. ? Foods that are greasy or contain a lot of fat or sugar.  Take over-the-counter and prescription medicines only as told by your health care provider.  Do not take sodium tablets. This can lead to having too much sodium in the body (hypernatremia).  Eat foods that contain a healthy balance of electrolytes, such as bananas, oranges, potatoes, tomatoes, and spinach.  Keep all follow-up visits as told by your health care provider. This is important. Contact a health care provider if:  You have abdominal pain that: ? Gets worse. ? Stays in one area (localizes).  You have a rash.  You have a stiff neck.  You are more irritable than usual.  You are sleepier or more difficult to wake up than usual.  You feel weak or dizzy.  You feel very thirsty.  You have urinated only a small amount of very dark urine over 6-8 hours. Get help right away if:  You have symptoms of severe dehydration.  You cannot drink fluids without vomiting.  Your symptoms get worse with treatment.  You have a fever.  You have a severe headache.  You have vomiting or diarrhea that: ? Gets worse. ? Does not go away.  You have blood or green matter (bile) in your vomit.  You have blood in your stool. This may cause stool to look black and tarry.  You have not urinated in 6-8 hours.  You faint.  Your heart rate while sitting still is over 100 beats a minute.  You have trouble breathing. This information is not intended to replace advice given to you by your health care provider. Make sure you discuss any questions you have  with your health care provider. Document Released: 05/09/2005 Document Revised: 12/04/2015 Document Reviewed: 07/03/2015 Elsevier Interactive Patient Education  2019 Elsevier Inc.  

## 2018-10-23 ENCOUNTER — Ambulatory Visit: Payer: BC Managed Care – PPO

## 2018-10-24 ENCOUNTER — Ambulatory Visit: Payer: BLUE CROSS/BLUE SHIELD

## 2018-10-24 ENCOUNTER — Ambulatory Visit: Payer: BC Managed Care – PPO

## 2018-10-25 ENCOUNTER — Ambulatory Visit: Payer: BC Managed Care – PPO

## 2018-10-26 ENCOUNTER — Other Ambulatory Visit: Payer: Self-pay

## 2018-10-26 ENCOUNTER — Inpatient Hospital Stay: Payer: BC Managed Care – PPO

## 2018-10-26 ENCOUNTER — Encounter: Payer: Self-pay | Admitting: General Practice

## 2018-10-26 VITALS — BP 102/68 | HR 89 | Temp 99.6°F | Resp 18

## 2018-10-26 DIAGNOSIS — E860 Dehydration: Secondary | ICD-10-CM

## 2018-10-26 DIAGNOSIS — C320 Malignant neoplasm of glottis: Secondary | ICD-10-CM | POA: Diagnosis not present

## 2018-10-26 MED ORDER — SODIUM CHLORIDE 0.9 % IV SOLN
INTRAVENOUS | Status: DC
  Administered 2018-10-26: 11:00:00 via INTRAVENOUS
  Filled 2018-10-26 (×2): qty 250

## 2018-10-26 MED ORDER — SODIUM CHLORIDE 0.9 % IV SOLN
INTRAVENOUS | Status: DC
  Administered 2018-10-26: 10:00:00 via INTRAVENOUS
  Filled 2018-10-26 (×2): qty 250

## 2018-10-26 NOTE — Progress Notes (Signed)
[facility] CSW Progress Notes  Call to patient to check in after finishing treatments.  No needs at this time.  Living w daughter.  Frustrated that he is not gaining weight as fast as he would like.  Managing eating w help of dietitian plan.  Working on adjusting to life after retirement. Pain adequately controlled and has access to methadone that he needs through clinic.  No other needs, knows to call if needs arise.  Edwyna Shell, LCSW Clinical Social Worker Phone:  [phone]

## 2018-10-29 ENCOUNTER — Other Ambulatory Visit: Payer: Self-pay

## 2018-10-29 ENCOUNTER — Other Ambulatory Visit: Payer: Self-pay | Admitting: Hematology

## 2018-10-29 ENCOUNTER — Inpatient Hospital Stay: Payer: BC Managed Care – PPO

## 2018-10-29 ENCOUNTER — Telehealth: Payer: Self-pay | Admitting: *Deleted

## 2018-10-29 VITALS — BP 147/84 | HR 111 | Temp 98.7°F | Resp 18

## 2018-10-29 DIAGNOSIS — G44209 Tension-type headache, unspecified, not intractable: Secondary | ICD-10-CM

## 2018-10-29 DIAGNOSIS — C320 Malignant neoplasm of glottis: Secondary | ICD-10-CM

## 2018-10-29 MED ORDER — SODIUM CHLORIDE 0.9 % IV SOLN
INTRAVENOUS | Status: DC
  Administered 2018-10-29 (×2): via INTRAVENOUS
  Filled 2018-10-29 (×2): qty 250

## 2018-10-29 MED ORDER — KETOROLAC TROMETHAMINE 30 MG/ML IJ SOLN: 15.0000 mg | Freq: Once | INTRAMUSCULAR | Status: AC

## 2018-10-29 MED ORDER — ACETAMINOPHEN 160 MG/5ML PO SOLN
1000.0000 mg | Freq: Once | ORAL | Status: AC
  Administered 2018-10-29: 1000 mg

## 2018-10-29 MED ORDER — ACETAMINOPHEN 160 MG/5ML PO SOLN
ORAL | Status: AC
  Filled 2018-10-29: qty 40.6

## 2018-10-29 MED ORDER — METHADONE HCL 10 MG/5ML PO SOLN: 30.0000 mg | Freq: Three times a day (TID) | ORAL | 0 refills | Status: AC

## 2018-10-29 MED FILL — METHADONE 10 MG/5 ML SOLUTI: 10 | 4 days supply | Qty: 180 | Fill #0

## 2018-10-29 NOTE — Progress Notes (Signed)
Per Ned Card, Patient OK to treat with today's labs

## 2018-10-29 NOTE — Telephone Encounter (Signed)
While in infusion, patient requested Dr. Irene Limbo provide refill of Methadone. Informed nurse in infusion that he has been taking more of it for increase in headaches - having them daily. Has now run out and does not go back to clinic until Thursday.  Message given to Dr. Irene Limbo. Dr. Irene Limbo asked that patient be instructed to contact methadone clinic with this information.  Dr. Irene Limbo prescribed 4 day supply of Methadone, to bridge patient to appt at methadone clinic. MD sent RX to [facility].  Per Dr. Irene Limbo, inform patient that he will not provide further prescriptions for methadone. Dr. Irene Limbo ordered medication for patient's headache while in infusion. Patient had been discharged by the time order received by infusion nurse. Infusion nurse stated patient did not receive any medication for his headache in infusion today. Patient had been discharged when order for 4 day supply of methadone sent to pharmacy.  Attempted to contact patient to inform him of prescription, left voice mail for patient with prescription information.

## 2018-10-30 ENCOUNTER — Inpatient Hospital Stay: Payer: BC Managed Care – PPO

## 2018-10-30 NOTE — Progress Notes (Signed)
  Patient Name: Ronald Ruiz MRN: [id number] DOB: 1956-02-24 Referring Physician: Sullivan Lone (Profile Not Attached) Date of Service: 10/16/2018 [facility], [place]                                                        End Of Treatment Note  Diagnoses: C32.0-Malignant neoplasm of glottis  Cancer Staging Malignant neoplasm of glottis ([place]) Staging form: Larynx - Glottis, AJCC 8th Edition - Clinical: Stage III (cT3, cN0, cM0) - Signed by Eppie Gibson, MD on 08/20/2018  Intent: Curative  Radiation Treatment Dates: 08/27/2018 through 10/16/2018 Site Technique Total Dose Dose per Fx Completed Fx Beam Energies  Head & neck: HN_larynx IMRT 70/70 2 35/35 6X   Narrative: The patient tolerated radiation therapy relatively well. He developed sore throat and is receiving all nutrition via his PEG tube and promod supplements. He reports fatigue has significantly improved from receiving IVF 3 x weekly. He is applying Sonafine and Neosporin to his radiation site for erythema and dry patches of desquamation over neck.  Plan: The patient will follow-up with radiation oncology in two weeks.  ________________________________________________  Eppie Gibson, MD  This document serves as a record of services personally performed by Eppie Gibson, MD. It was created on her behalf by Rae Lips, a trained medical scribe. The creation of this record is based on the scribe's personal observations and the provider's statements to them. This document has been checked and approved by the attending provider.

## 2018-10-30 NOTE — Progress Notes (Signed)
Nutrition Follow-up:  RD working remotely.  Patient with cancer of larynx.  Patient has completed chemotherapy and radiation therapy.    Called and spoke with patient via phone.  Reports that he has had issues with headache this week and does not feel good.  Reports that MD is addressing headaches.  Reports he is out of his medicine (?? Methadone) and planning to pick up more today.  Reports that he has only been able to get in 6 bottles of osmolite 1.5 daily recently due to not feeling good, feeling full, some nausea.  Has been taking medication to help.  Reports that he is giving about 84ml of free water now because he gets full.  Is receiving fluids 2 times per week.  Reports diarrhea about 1 time per week.      Medications: reviewed  Labs: reviewed  Anthropometrics:   Weight 139 lb 12.8 oz on 5/27 increased from 134 lb on 5/11   Estimated Energy Needs  Kcals: 1980-2310 calories Protein: 100-112 g Fluid: 2.3 L  NUTRITION DIAGNOSIS: Unintentional weight loss improved   INTERVENTION:  Continue osmolite 1.5 at least 6 cartons per day and continue promod 16ml BID. Encouraged patient to increase water flush back to 1160 ml as was doing before.  Reports he will try. Will monitor weight may need to increase back to 7 if starts to decline.   Current regimen providing 2330 calories, 109 g protein and 2280ml (free water and free water in formula).      MONITORING, EVALUATION, GOAL: Patient will tolerate tube feeding and promod to minimize weight loss   NEXT VISIT: phone f/u on July 7  Nyshaun Standage B. Zenia Resides, [place], [place] Registered Dietitian [phone] (pager)

## 2018-10-31 ENCOUNTER — Telehealth: Payer: Self-pay | Admitting: *Deleted

## 2018-10-31 ENCOUNTER — Encounter: Payer: Self-pay | Admitting: Radiation Oncology

## 2018-10-31 ENCOUNTER — Ambulatory Visit
Admission: RE | Admit: 2018-10-31 | Discharge: 2018-10-31 | Disposition: A | Discharge status: Home / Self Care | Payer: BC Managed Care – PPO | Source: Ambulatory Visit | Attending: Radiation Oncology | Admitting: Radiation Oncology

## 2018-10-31 ENCOUNTER — Other Ambulatory Visit: Payer: Self-pay

## 2018-10-31 VITALS — BP 107/70 | HR 79 | Temp 98.5°F | Resp 20 | Ht 72.0 in | Wt 135.8 lb

## 2018-10-31 DIAGNOSIS — Z923 Personal history of irradiation: Secondary | ICD-10-CM | POA: Diagnosis not present

## 2018-10-31 DIAGNOSIS — J439 Emphysema, unspecified: Secondary | ICD-10-CM | POA: Diagnosis not present

## 2018-10-31 DIAGNOSIS — Z79899 Other long term (current) drug therapy: Secondary | ICD-10-CM | POA: Insufficient documentation

## 2018-10-31 DIAGNOSIS — I2510 Atherosclerotic heart disease of native coronary artery without angina pectoris: Secondary | ICD-10-CM | POA: Insufficient documentation

## 2018-10-31 DIAGNOSIS — R51 Headache: Secondary | ICD-10-CM | POA: Insufficient documentation

## 2018-10-31 DIAGNOSIS — Z9221 Personal history of antineoplastic chemotherapy: Secondary | ICD-10-CM | POA: Diagnosis not present

## 2018-10-31 DIAGNOSIS — C320 Malignant neoplasm of glottis: Secondary | ICD-10-CM

## 2018-10-31 DIAGNOSIS — I700 Atherosclerosis of aorta: Secondary | ICD-10-CM | POA: Insufficient documentation

## 2018-10-31 DIAGNOSIS — R634 Abnormal weight loss: Secondary | ICD-10-CM

## 2018-10-31 DIAGNOSIS — Z1329 Encounter for screening for other suspected endocrine disorder: Secondary | ICD-10-CM

## 2018-10-31 NOTE — Progress Notes (Signed)
Radiation Oncology         [phone] ________________________________  Name: Ronald Ruiz MRN: [id number]  Date: 10/31/2018  DOB: [birth year]  Follow-Up Visit Note, in person  CC: [name], MD  [name], MD  Diagnosis and Prior Radiotherapy:       ICD-10-CM   1. Malignant neoplasm of glottis ([place]) C32.0 NM PET Image Restag (PS) Skull Base To Thigh    TSH  2. Screening for hypothyroidism Z13.29 TSH  3. Loss of weight R63.4 TSH    CHIEF COMPLAINT:  Here for follow-up and surveillance of throat cancer  Narrative:  The patient returns today for routine follow-up.  He is feeling much better.  He does have some headaches but they are controlled.  He is taking in Osmolite 6 bottles daily.  His weight is relatively stable.  He is drinking some water.  He has been doing some swallowing exercises but he has not seen speech-language pathologist for a couple of months.  He continues IV fluids twice a week and has follow-up soon with medical oncology.     He continues to be followed by our nutritionist.               ALLERGIES:  has No Known Allergies.  Meds: Current Outpatient Medications  Medication Sig Dispense Refill  . budesonide (PULMICORT) 0.5 MG/2ML nebulizer solution INHALE CONTENTS OF 1 VIAL VIA NEBULIZER 2 TIMES DAILY 360 mL 5  . ipratropium-albuterol (DUONEB) 0.5-2.5 (3) MG/3ML SOLN TAKE 3 MLS BY NEBULIZATION EVERY 6 (SIX) HOURS AS NEEDED. 1080 mL 2  . lidocaine-prilocaine (EMLA) cream Apply to affected area once 30 g 3  . methadone (DOLOPHINE) 10 MG/5ML solution Take 15 mLs (30 mg total) by mouth 3 (three) times daily for 4 days. Till f/u in methadone clinic in 4 days. 180 mL 0  . methadone (DOLOPHINE) 10 MG/ML solution Take 110 mg by mouth daily.    . Nutritional Supplements (FEEDING SUPPLEMENT, OSMOLITE 1.5 CAL,) LIQD Give 410 ml 4 times daily via feeding tube (7 cartons daily needed) 1659 mL 0  . Nutritional Supplements (PROMOD) LIQD Give 30 mL Promod  twice daily via PEG. 2 Bottle 2  . ondansetron (ZOFRAN) 4 MG/5ML solution Place 10 mLs (8 mg total) into feeding tube 2 (two) times daily as needed for nausea or vomiting. 100 mL 1  . PERFOROMIST 20 MCG/2ML nebulizer solution INHALE CONTENTS OF 1 VIAL VIA NEBULIZER 2 TIMES DAILY 360 mL 2  . prochlorperazine (COMPAZINE) 5 MG tablet Place 1-2 tablets (5-10 mg total) into feeding tube every 6 (six) hours as needed for nausea or vomiting. 30 tablet 0  . Water For Irrigation, Sterile (FREE WATER) SOLN Place 300 mLs into feeding tube every 8 (eight) hours. 5000 mL 0  . albuterol (PROVENTIL HFA;VENTOLIN HFA) 108 (90 Base) MCG/ACT inhaler Inhale 1-2 puffs into the lungs every 6 (six) hours as needed for wheezing.     Marland Kitchen dexamethasone (DEXAMETHASONE INTENSOL) 1 MG/ML solution Place 8 mLs (8 mg total) into feeding tube daily. Take 8 mg total by mouth daily starting the day after chemotherapy for 2 days (Patient not taking: Reported on 10/31/2018) 60 mL 3  . ibuprofen (ADVIL,MOTRIN) 100 MG/5ML suspension Place 20-30 mLs (400-600 mg total) into feeding tube every 8 (eight) hours as needed for mild pain. 300 mL 0  . lidocaine (XYLOCAINE) 2 % solution Patient: Mix 1part 2% viscous lidocaine, 1part H20. Swallow 25mL of diluted mixture, 75min before meals and at bedtime, up  to QID (Patient not taking: Reported on 10/31/2018) 100 mL 5  . nicotine (NICODERM CQ - DOSED IN MG/24 HOURS) 21 mg/24hr patch Place 1 patch (21 mg total) onto the skin daily. 28 patch 0  . prochlorperazine (COMPAZINE) 10 MG tablet Take 1 tablet (10 mg total) by mouth every 6 (six) hours as needed (Nausea or vomiting). (Patient not taking: Reported on 10/31/2018) 30 tablet 1  . sucralfate (CARAFATE) 1 GM/10ML suspension TAKE 10 MLS (1 G TOTAL) BY MOUTH 2 (TWO) TIMES DAILY. (Patient not taking: Reported on 10/31/2018) 420 mL 0   No current facility-administered medications for this encounter.    Facility-Administered Medications Ordered in Other  Encounters  Medication Dose Route Frequency Provider Last Rate Last Dose  . ketorolac (TORADOL) 30 MG/ML injection 15 mg  15 mg Intravenous Once [name], MD        Physical Findings: The patient is in no acute distress. Patient is alert and oriented. Wt Readings from Last 3 Encounters:  10/31/18 135 lb 12.8 oz (61.6 kg)  10/17/18 139 lb 12.8 oz (63.4 kg)  10/01/18 134 lb 9.6 oz (61.1 kg)    height is 6' (1.829 m) and weight is 135 lb 12.8 oz (61.6 kg). His oral temperature is 98.5 F (36.9 C). His blood pressure is 107/70 and his pulse is 79. His respiration is 20 and oxygen saturation is 98%. .  General: Alert and oriented, in no acute distress HEENT: Head is normocephalic.  Neck: Neck is notable for good skin healing.  Still some residual dryness and erythema.  [name] is intact Psychiatric: Judgment and insight are intact. Affect is appropriate.   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 Status post chemotherapy and radiation therapy. EXAM: CT CHEST WITHOUT CONTRAST TECHNIQUE: Multidetector CT imaging of the chest was performed following the standard protocol without IV contrast. COMPARISON:  Chest radiograph, 07/21/2018. PET-CT, 07/17/2018 and chest CT, 07/03/2018. FINDINGS: Cardiovascular: Heart is normal in size. No pericardial effusion. There are three-vessel coronary artery calcifications and mild aortic atherosclerotic calcifications. Mediastinum/Nodes: Heterogeneous thyroid consistent with multiple nodules, most not well-defined, largest discrete nodule containing a calcification and measuring 2.8 cm in long axis. No neck base or  axillary adenopathy. No mediastinal or hilar masses or enlarged lymph nodes. Trachea and esophagus are unremarkable. Tracheostomy tube is well positioned. Lungs/Pleura: On the right, reticulonodular opacities in the right middle lobe are unchanged, with a dominant nodule measuring 1 cm in long axis. On the left, there are new tree-in-bud type opacities in the left lower lobe associated with bronchial wall thickening and bronchial opacification consistent with mucoid impaction. The previously measured nodule is smaller, currently centered on image 140, series 7, measuring 8 x 3 mm, previously 11 x 5 mm. Another discrete nodule inferior to this, left lower lobe, image 144, measures 4 mm, previously 5 mm. There is stable a pole pleuroparenchymal scarring. Changes of mild centrilobular emphysema are again noted, also unchanged. No pleural effusion or pneumothorax. Upper Abdomen: No acute findings.  Well-positioned gastrostomy tube. Musculoskeletal: No fracture or acute finding. No osteoblastic or osteolytic lesions. IMPRESSION: 1. Since  the prior CT, the patient has developed tree-in-bud type opacities, bronchial wall thickening, and bronchial mucous plugging, in the left lower lobe consistent with infection/inflammation. This is suspicious for MAI infection. The nodule previously measured in left lower lobe on the prior study has decreased in size as has and a smaller adjacent nodule. These findings all support a benign inflammatory etiology. 2. Nodules right middle lobe, with a dominant nodule measuring 1 cm, are all stable. 3. Stable changes of centrilobular emphysema. 4. Three-vessel coronary artery calcifications and aortic atherosclerosis, also stable. Aortic Atherosclerosis (ICD10-I70.0) and Emphysema (ICD10-J43.9). Electronically Signed   By: Lajean Manes M.D.   On: 10/16/2018 15:57    Impression/Plan:    1) Head and Neck Cancer Status: Healing from chemoradiotherapy  2) Nutritional Status:  needs to  continue close follow-up with nutrition  PEG tube: Using, Osmolite  3) Risk Factors: The patient has been educated about risk factors including alcohol and tobacco abuse; they understand that avoidance of alcohol and tobacco is important to prevent recurrences as well as other cancers  4) Swallowing: I have sent a message to speech-language pathology to integrate him back into their clinic and I encouraged the patient to continue swallowing exercises to prevent long-term dysphagia  5) Continue regular followup with dentistry, and dental hygiene including fluoride rinses.    6) Thyroid function: Check annually Lab Results  Component Value Date   TSH 1.680 06/14/2018    7)  Follow-up in the beginning of September after PET scan. The patient was encouraged to call with any issues or questions before then.    _____________________________________   Eppie Gibson, MD

## 2018-10-31 NOTE — Telephone Encounter (Signed)
MR faxed to [facility] - Release: [id number]

## 2018-10-31 NOTE — Progress Notes (Signed)
Ronald Ruiz presents for follow up of radiation completed 10/16/18 to his head, neck, and larynx.   Pain issues, if any: He denies Using a feeding tube?: Yes, 6 bottles of osmolite daily. He has not been feeling well due to headaches. He plans to increase this as he feels better.  Weight changes, if any:  Wt Readings from Last 3 Encounters:  10/31/18 135 lb 12.8 oz (61.6 kg)  10/17/18 139 lb 12.8 oz (63.4 kg)  10/01/18 134 lb 9.6 oz (61.1 kg)   Swallowing issues, if any: He is not swallowing anything orally Smoking or chewing tobacco? No Using fluoride trays daily? No Last ENT visit was on: Not since diagnosis Other notable issues, if any:  He is receiving IVF twice weekly in medical oncology.  He will see Dr. Irene Limbo next on 6/12, for an office visit and IVF.   BP 107/70 (BP Location: Left Arm, Patient Position: Sitting)   Pulse 79   Temp 98.5 F (36.9 C) (Oral)   Resp 20   Ht 6' (1.829 m)   Wt 135 lb 12.8 oz (61.6 kg)   SpO2 98%   BMI 18.42 kg/m

## 2018-11-01 NOTE — Progress Notes (Signed)
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 Mcbain is a 63 year old male with a past medical history including substance abuse and currently on methadone, emphysema, and bronchitis.  The patient was undergoing outpatient evaluation for lung nodules and a vocal cord lesion and presented to the emergency room with worsening dyspnea.  ENT was consulted for his vocal cord mass.  Due to the location of the laryngeal mass, the patient was in danger of developing an upper airway obstruction.  A tracheostomy was placed to protect his airway.  A biopsy of the mass was performed.  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  The patient tells me that he was having persistent upper respiratory symptoms with dyspnea and cough since about November 2019.  He was on several rounds of antibiotics and steroids without significant improvement.  He reports a 23-month history of hoarseness in his voice.  He has lost about 40 pounds over the past 6 months.  He also reports having a sore throat.  Denies difficulty swallowing to admission.  Breathing is better since placement of tracheostomy.  He has not had any fevers or chills.  Denies chest pain.  Denies nausea, vomiting, constipation, diarrhea.  He is on NG tube feeding at this time.  Working with speech therapy on swallowing.  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  The patient is widowed and currently lives alone.  Wife died about 15 months ago secondary to lung cancer. Upon discharge, he plans to go live with 1 of his daughters in [place], [place].  He has 3 daughters who live locally.  1 of  his daughters is an Therapist, sports who works on the oncology unit at [employer].  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 The patient's last visit with [place] was on 10/17/18. The pt reports that he is doing well overall.  The pt reports that he has been feeling better each day, and finished chemotherapy three weeks ago. He notes that he is having som more secretions. He is using a nebulizer. The pt notes that he feels that he is continuing to have some nausea with fluids, and has increased to 6 cans of feeding, and 7 is his calculated dose. He has lost 3 pounds in the last few weeks and attributes this to his recent headaches. The pt notes that his headaches have improved.  Lab results today (11/02/18) of CBC w/diff and CMP is as follows: all values are WNL except for RBC at 2.86, HGB at 9.6, HCT at 30.3, MCV at 105.9, RDW at 16.3, Lymphs abs at 500, Albumin at 2.8.  On review of systems, pt reports improving energy levels, occasional nausea, increased secretions, strengthening voice, and denies concerns for infections, ankle swelling, abdominal pains, and any other symptoms.    MEDICAL HISTORY:  Past Medical History:  Diagnosis Date   Bronchitis 03/2018   COPD (chronic obstructive pulmonary disease) (HCC)    Herpes    laryngeal ca dx'd 06/2018   Lung nodule 2020   Substance abuse ([place])    opiate addiction, been on methadone for 11  years    SURGICAL HISTORY: Past Surgical History:  Procedure Laterality Date   DIRECT LARYNGOSCOPY N/A 07/21/2018   Procedure: DIRECT LARYNGOSCOPY, TRACHEOSTOMY, BIOPSY;  Surgeon: Leta Baptist, MD;  Location: [facility];  Service: ENT;  Laterality: N/A;   IR GASTROSTOMY TUBE MOD SED  08/02/2018    SOCIAL HISTORY: Social History   Socioeconomic History   Marital status: Widowed    Spouse name: Not on file   Number of children: Not on file   Years of education:  Not on file   Highest education level: Not on file  Occupational History   Not on file  Social Needs   Financial resource strain: Not on file   Food insecurity    Worry: Not on file    Inability: Not on file   Transportation needs    Medical: No    Non-medical: No  Tobacco Use   Smoking status: Former Smoker    Packs/day: 2.00    Years: 50.00    Pack years: 100.00    Types: Cigarettes   Smokeless tobacco: Never Used   Tobacco comment: 07/06/18 at 10 cigs per day, quit 06/16/18  Substance and Sexual Activity   Alcohol use: Not Currently   Drug use: Not Currently    Comment: hx of opiod abuse   Sexual activity: Not Currently    Birth control/protection: None  Lifestyle   Physical activity    Days per week: Not on file    Minutes per session: Not on file   Stress: Not on file  Relationships   Social connections    Talks on phone: Not on file    Gets together: Not on file    Attends religious service: Not on file    Active member of club or organization: Not on file    Attends meetings of clubs or organizations: Not on file    Relationship status: Not on file   Intimate partner violence    Fear of current or ex partner: No    Emotionally abused: No    Physically abused: No    Forced sexual activity: No  Other Topics Concern   Not on file  Social History Narrative   Not on file    FAMILY HISTORY: Family History  Problem Relation Age of Onset   Alcohol abuse Father    Cancer Father    Cirrhosis Father    Drug abuse Brother     ALLERGIES:  has No Known Allergies.  MEDICATIONS:  Current Outpatient Medications  Medication Sig Dispense Refill   albuterol (PROVENTIL HFA;VENTOLIN HFA) 108 (90 Base) MCG/ACT inhaler Inhale 1-2 puffs into the lungs every 6 (six) hours as needed for wheezing.      budesonide (PULMICORT) 0.5 MG/2ML nebulizer solution INHALE CONTENTS OF 1 VIAL VIA NEBULIZER 2 TIMES DAILY 360 mL 5   dexamethasone (DEXAMETHASONE  INTENSOL) 1 MG/ML solution Place 8 mLs (8 mg total) into feeding tube daily. Take 8 mg total by mouth daily starting the day after chemotherapy for 2 days (Patient not taking: Reported on 10/31/2018) 60 mL 3   ibuprofen (ADVIL,MOTRIN) 100 MG/5ML suspension Place 20-30 mLs (400-600 mg total) into feeding tube every 8 (eight) hours as needed for mild pain. 300 mL 0   ipratropium-albuterol (DUONEB) 0.5-2.5 (3) MG/3ML SOLN TAKE 3 MLS BY NEBULIZATION EVERY 6 (SIX) HOURS AS NEEDED. 1080 mL 2   lidocaine (XYLOCAINE) 2 % solution Patient: Mix 1part 2% viscous lidocaine, 1part H20. Swallow 2mL of diluted mixture, 36min before  meals and at bedtime, up to QID (Patient not taking: Reported on 10/31/2018) 100 mL 5   lidocaine-prilocaine (EMLA) cream Apply to affected area once 30 g 3   methadone (DOLOPHINE) 10 MG/5ML solution Take 15 mLs (30 mg total) by mouth 3 (three) times daily for 4 days. Till f/u in methadone clinic in 4 days. 180 mL 0   methadone (DOLOPHINE) 10 MG/ML solution Take 110 mg by mouth daily.     nicotine (NICODERM CQ - DOSED IN MG/24 HOURS) 21 mg/24hr patch Place 1 patch (21 mg total) onto the skin daily. 28 patch 0   Nutritional Supplements (FEEDING SUPPLEMENT, OSMOLITE 1.5 CAL,) LIQD Give 410 ml 4 times daily via feeding tube (7 cartons daily needed) 1659 mL 0   Nutritional Supplements (PROMOD) LIQD Give 30 mL Promod twice daily via PEG. 2 Bottle 2   ondansetron (ZOFRAN) 4 MG/5ML solution Place 10 mLs (8 mg total) into feeding tube 2 (two) times daily as needed for nausea or vomiting. 100 mL 1   PERFOROMIST 20 MCG/2ML nebulizer solution INHALE CONTENTS OF 1 VIAL VIA NEBULIZER 2 TIMES DAILY 360 mL 2   prochlorperazine (COMPAZINE) 10 MG tablet Take 1 tablet (10 mg total) by mouth every 6 (six) hours as needed (Nausea or vomiting). (Patient not taking: Reported on 10/31/2018) 30 tablet 1   prochlorperazine (COMPAZINE) 5 MG tablet Place 1-2 tablets (5-10 mg total) into feeding tube every  6 (six) hours as needed for nausea or vomiting. 30 tablet 0   sucralfate (CARAFATE) 1 GM/10ML suspension TAKE 10 MLS (1 G TOTAL) BY MOUTH 2 (TWO) TIMES DAILY. (Patient not taking: Reported on 10/31/2018) 420 mL 0   Water For Irrigation, Sterile (FREE WATER) SOLN Place 300 mLs into feeding tube every 8 (eight) hours. 5000 mL 0   Current Facility-Administered Medications  Medication Dose Route Frequency Provider Last Rate Last Dose   0.9 %  sodium chloride infusion   Intravenous Continuous [name], MD       Facility-Administered Medications Ordered in Other Visits  Medication Dose Route Frequency Provider Last Rate Last Dose   ketorolac (TORADOL) 30 MG/ML injection 15 mg  15 mg Intravenous Once [name], MD        REVIEW OF SYSTEMS:    A 10+ POINT REVIEW OF SYSTEMS WAS OBTAINED including neurology, dermatology, psychiatry, cardiac, respiratory, lymph, extremities, GI, GU, Musculoskeletal, constitutional, breasts, reproductive, HEENT.  All pertinent positives are noted in the HPI.  All others are negative.   PHYSICAL EXAMINATION: ECOG PERFORMANCE STATUS: 1 - Symptomatic but completely ambulatory  Vitals:   11/02/18 1119  BP: 100/68  Pulse: 76  Resp: 18  Temp: 98 F (36.7 C)  SpO2: 100%   Filed Weights   11/02/18 1119  Weight: 136 lb 1.6 oz (61.7 kg)   .Body mass index is 18.46 kg/m.  GENERAL:alert, in no acute distress and comfortable SKIN: no acute rashes, no significant lesions EYES: conjunctiva are pink and non-injected, sclera anicteric OROPHARYNX: MMM, no exudates, no oropharyngeal erythema or ulceration NECK: supple, no JVD. Tracheostomy in situ LYMPH:  no palpable lymphadenopathy in the cervical, axillary or inguinal regions LUNGS: clear to auscultation b/l with normal respiratory effort HEART: regular rate & rhythm ABDOMEN:  normoactive bowel sounds , non tender, not distended. No palpable hepatosplenomegaly. PEG tube in situ Extremity: no  pedal edema PSYCH: alert & oriented x 3 with fluent speech NEURO: no focal motor/sensory deficits   LABORATORY DATA:  I have reviewed the  data as listed  . CBC Latest Ref Rng & Units 11/02/2018 10/22/2018 10/17/2018  WBC 4.0 - 10.5 K/uL 7.3 14.1(H) 4.3  Hemoglobin 13.0 - 17.0 g/dL 9.6(L) 10.7(L) 9.7(L)  Hematocrit 39.0 - 52.0 % 30.3(L) 33.3(L) 30.5(L)  Platelets 150 - 400 K/uL 290 229 188    . CMP Latest Ref Rng & Units 11/02/2018 10/22/2018 10/17/2018  Glucose 70 - 99 mg/dL 97 100(H) 112(H)  BUN 8 - 23 mg/dL 18 16 17   Creatinine 0.61 - 1.24 mg/dL 0.84 0.78 0.75  Sodium 135 - 145 mmol/L 139 138 141  Potassium 3.5 - 5.1 mmol/L 4.1 4.1 4.2  Chloride 98 - 111 mmol/L 99 96(L) 98  CO2 22 - 32 mmol/L 30 31 35(H)  Calcium 8.9 - 10.3 mg/dL 9.4 9.8 9.5  Total Protein 6.5 - 8.1 g/dL 7.9 7.6 7.0  Total Bilirubin 0.3 - 1.2 mg/dL 0.3 0.4 0.2(L)  Alkaline Phos 38 - 126 U/L 65 74 66  AST 15 - 41 U/L 15 16 15   ALT 0 - 44 U/L 11 14 14      RADIOGRAPHIC STUDIES: I have personally reviewed the radiological images as listed and agreed with the findings in the report. Ct Chest Wo Contrast  Result Date: 10/16/2018 CLINICAL DATA:  Laryngeal carcinoma diagnosed in February 2020. Status post chemotherapy and radiation therapy. EXAM: CT CHEST WITHOUT CONTRAST TECHNIQUE: Multidetector CT imaging of the chest was performed following the standard protocol without IV contrast. COMPARISON:  Chest radiograph, 07/21/2018. PET-CT, 07/17/2018 and chest CT, 07/03/2018. FINDINGS: Cardiovascular: Heart is normal in size. No pericardial effusion. There are three-vessel coronary artery calcifications and mild aortic atherosclerotic calcifications. Mediastinum/Nodes: Heterogeneous thyroid consistent with multiple nodules, most not well-defined, largest discrete nodule containing a calcification and measuring 2.8 cm in long axis. No neck base or axillary adenopathy. No mediastinal or hilar masses or enlarged lymph nodes. Trachea  and esophagus are unremarkable. Tracheostomy tube is well positioned. Lungs/Pleura: On the right, reticulonodular opacities in the right middle lobe are unchanged, with a dominant nodule measuring 1 cm in long axis. On the left, there are new tree-in-bud type opacities in the left lower lobe associated with bronchial wall thickening and bronchial opacification consistent with mucoid impaction. The previously measured nodule is smaller, currently centered on image 140, series 7, measuring 8 x 3 mm, previously 11 x 5 mm. Another discrete nodule inferior to this, left lower lobe, image 144, measures 4 mm, previously 5 mm. There is stable a pole pleuroparenchymal scarring. Changes of mild centrilobular emphysema are again noted, also unchanged. No pleural effusion or pneumothorax. Upper Abdomen: No acute findings.  Well-positioned gastrostomy tube. Musculoskeletal: No fracture or acute finding. No osteoblastic or osteolytic lesions. IMPRESSION: 1. Since the prior CT, the patient has developed tree-in-bud type opacities, bronchial wall thickening, and bronchial mucous plugging, in the left lower lobe consistent with infection/inflammation. This is suspicious for MAI infection. The nodule previously measured in left lower lobe on the prior study has decreased in size as has and a smaller adjacent nodule. These findings all support a benign inflammatory etiology. 2. Nodules right middle lobe, with a dominant nodule measuring 1 cm, are all stable. 3. Stable changes of centrilobular emphysema. 4. Three-vessel coronary artery calcifications and aortic atherosclerosis, also stable. Aortic Atherosclerosis (ICD10-I70.0) and Emphysema (ICD10-J43.9). Electronically Signed   By: Lajean Manes M.D.   On: 10/16/2018 15:57    ASSESSMENT & PLAN:   63 y.o. male with  1. Newly diagnosed clinically and radiographically cT3 N0,M0 Squamous  Cell Carcinoma of the larynx involving the left vocal cord and part of the  supraglottis.  Discussed with Dr. Benjamine Mola at bedside patient is not a candidate for larynx sparing surgery. He has lost about 40 pounds and is nutritionally quite depleted. Heavy smoking history of 1 to 2 packs/day with COPD /emphysema.  Not on home oxygen. 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 Has tolerated his tracheostomy well and is been able to speak with Passy-Muir valve. Has had issues with swallowing and is still needing tube feeding.  07/17/18 PET/CT revealed he has multiple pulmonary nodules none of them are FDG avid. No FDG avid cervical lymphadenopathy. FDG avid laryngeal tumor noted.  10/16/18 CT Chest revealed "Since the prior CT, the patient has developed tree-in-bud type opacities, bronchial wall thickening, and bronchial mucous plugging, in the left lower lobe consistent with infection/inflammation. This is suspicious for MAI infection. The nodule previously measured in left lower lobe on the prior study has decreased in size as has and a smaller adjacent nodule. These findings all support a benign inflammatory etiology. 2. Nodules right middle lobe, with a dominant nodule measuring 1 cm, are all stable. 3. Stable changes of centrilobular emphysema. 4. Three-vessel coronary artery calcifications and aortic atherosclerosis, also stable. Aortic Atherosclerosis and Emphysema."  S/p 6 weekly doses of Carboplatin and Taxol with concurrent RT, completed 10/16/18. He received IMRT total dose of 70 Gray in 35 fractions.  PLAN: -Discussed pt labwork today, 11/02/18; albumin at 2.8, some anemia which is expected to improve as pt is off treatment now, WBC normal and PLT normal.  -Discussed priority at this point is to focus on nutrition, staying active, and staying well hydrated -Recommend following up with Pulmonology for management and evaluation of secretions, could be role for Pulmonary dornase . Mucomyst  inhalation or flutter valve? -Did decrease to IVF twice a week, and pt will increase hydration status through tube -Continue Sucralfate, Biotene, Lidocaine, and salt and baking soda mouthwashes -Following with Dr Isidore Moos in [place] with absolute smoking cessation -Will see the pt back in 3 weeks   Continue IVF 2L of NS twice weekly Monday and Friday for 4 weeks RTC with Dr Irene Limbo with labs in 3 weeks   All of the patients questions were answered with apparent satisfaction. The patient knows to call the clinic with any problems, questions or concerns.  The total time spent in the appt was 15 minutes and more than 50% was on counseling and direct patient cares.    Sullivan Lone MD MS AAHIVMS [facility] Hematology/Oncology Physician [facility] [place]  (Office):       [phone] (Work cell):  [phone] (Fax):           [phone]  11/02/2018 12:00 PM  I, Baldwin [place], am acting as a scribe for Dr. Sullivan Lone.   .I have reviewed the above documentation for accuracy and completeness, and I agree with the above. [name] MD

## 2018-11-02 ENCOUNTER — Other Ambulatory Visit: Payer: Self-pay

## 2018-11-02 ENCOUNTER — Inpatient Hospital Stay: Payer: BC Managed Care – PPO

## 2018-11-02 ENCOUNTER — Inpatient Hospital Stay (HOSPITAL_BASED_OUTPATIENT_CLINIC_OR_DEPARTMENT_OTHER): Payer: BC Managed Care – PPO | Admitting: Hematology

## 2018-11-02 VITALS — BP 100/68 | HR 76 | Temp 98.0°F | Resp 18 | Ht 72.0 in | Wt 136.1 lb

## 2018-11-02 DIAGNOSIS — Z923 Personal history of irradiation: Secondary | ICD-10-CM

## 2018-11-02 DIAGNOSIS — C320 Malignant neoplasm of glottis: Secondary | ICD-10-CM

## 2018-11-02 DIAGNOSIS — Z87891 Personal history of nicotine dependence: Secondary | ICD-10-CM

## 2018-11-02 DIAGNOSIS — F1121 Opioid dependence, in remission: Secondary | ICD-10-CM

## 2018-11-02 DIAGNOSIS — R110 Nausea: Secondary | ICD-10-CM

## 2018-11-02 DIAGNOSIS — Z7189 Other specified counseling: Secondary | ICD-10-CM

## 2018-11-02 DIAGNOSIS — E860 Dehydration: Secondary | ICD-10-CM | POA: Diagnosis not present

## 2018-11-02 DIAGNOSIS — Z79899 Other long term (current) drug therapy: Secondary | ICD-10-CM | POA: Diagnosis not present

## 2018-11-02 DIAGNOSIS — I700 Atherosclerosis of aorta: Secondary | ICD-10-CM

## 2018-11-02 DIAGNOSIS — E440 Moderate protein-calorie malnutrition: Secondary | ICD-10-CM | POA: Diagnosis not present

## 2018-11-02 DIAGNOSIS — R51 Headache: Secondary | ICD-10-CM

## 2018-11-02 DIAGNOSIS — J449 Chronic obstructive pulmonary disease, unspecified: Secondary | ICD-10-CM

## 2018-11-02 DIAGNOSIS — Z9221 Personal history of antineoplastic chemotherapy: Secondary | ICD-10-CM

## 2018-11-02 DIAGNOSIS — R634 Abnormal weight loss: Secondary | ICD-10-CM

## 2018-11-02 DIAGNOSIS — I2510 Atherosclerotic heart disease of native coronary artery without angina pectoris: Secondary | ICD-10-CM

## 2018-11-02 LAB — CBC WITH DIFFERENTIAL/PLATELET
Abs Immature Granulocytes: 0.03 10*3/uL (ref 0.00–0.07)
Basophils Absolute: 0 10*3/uL (ref 0.0–0.1)
Basophils Relative: 0 %
Eosinophils Absolute: 0.2 10*3/uL (ref 0.0–0.5)
Eosinophils Relative: 2 %
HCT: 30.3 % — ABNORMAL LOW (ref 39.0–52.0)
Hemoglobin: 9.6 g/dL — ABNORMAL LOW (ref 13.0–17.0)
Immature Granulocytes: 0 %
Lymphocytes Relative: 7 %
Lymphs Abs: 0.5 10*3/uL — ABNORMAL LOW (ref 0.7–4.0)
MCH: 33.6 pg (ref 26.0–34.0)
MCHC: 31.7 g/dL (ref 30.0–36.0)
MCV: 105.9 fL — ABNORMAL HIGH (ref 80.0–100.0)
Monocytes Absolute: 0.7 10*3/uL (ref 0.1–1.0)
Monocytes Relative: 10 %
Neutro Abs: 5.8 10*3/uL (ref 1.7–7.7)
Neutrophils Relative %: 81 %
Platelets: 290 10*3/uL (ref 150–400)
RBC: 2.86 MIL/uL — ABNORMAL LOW (ref 4.22–5.81)
RDW: 16.3 % — ABNORMAL HIGH (ref 11.5–15.5)
WBC: 7.3 10*3/uL (ref 4.0–10.5)
nRBC: 0 % (ref 0.0–0.2)

## 2018-11-02 LAB — COMPREHENSIVE METABOLIC PANEL
ALT: 11 U/L (ref 0–44)
AST: 15 U/L (ref 15–41)
Albumin: 2.8 g/dL — ABNORMAL LOW (ref 3.5–5.0)
Alkaline Phosphatase: 65 U/L (ref 38–126)
Anion gap: 10 (ref 5–15)
BUN: 18 mg/dL (ref 8–23)
CO2: 30 mmol/L (ref 22–32)
Calcium: 9.4 mg/dL (ref 8.9–10.3)
Chloride: 99 mmol/L (ref 98–111)
Creatinine, Ser: 0.84 mg/dL (ref 0.61–1.24)
GFR calc Af Amer: >60 mL/min (ref >60)
GFR calc non Af Amer: >60 mL/min (ref >60)
Glucose, Bld: 97 mg/dL (ref 70–99)
Potassium: 4.1 mmol/L (ref 3.5–5.1)
Sodium: 139 mmol/L (ref 135–145)
Total Bilirubin: 0.3 mg/dL (ref 0.3–1.2)
Total Protein: 7.9 g/dL (ref 6.5–8.1)

## 2018-11-02 MED ORDER — SODIUM CHLORIDE 0.9 % IV SOLN
INTRAVENOUS | Status: DC
  Administered 2018-11-02: 13:00:00 via INTRAVENOUS
  Filled 2018-11-02: qty 250

## 2018-11-02 MED ORDER — SODIUM CHLORIDE 0.9 % IV SOLN
2000.0000 mL | Freq: Once | INTRAVENOUS | Status: AC
  Administered 2018-11-02: 1000 mL via INTRAVENOUS
  Filled 2018-11-02: qty 2000

## 2018-11-02 NOTE — Patient Instructions (Signed)
 Dehydration, Adult  Dehydration is when there is not enough fluid or water in your body. This happens when you lose more fluids than you take in. Dehydration can range from mild to very bad. It should be treated right away to keep it from getting very bad. Symptoms of mild dehydration may include:  Thirst.  Dry lips.  Slightly dry mouth.  Dry, warm skin.  Dizziness. Symptoms of moderate dehydration may include:  Very dry mouth.  Muscle cramps.  Dark pee (urine). Pee may be the color of tea.  Your body making less pee.  Your eyes making fewer tears.  Heartbeat that is uneven or faster than normal (palpitations).  Headache.  Light-headedness, especially when you stand up from sitting.  Fainting (syncope). Symptoms of very bad dehydration may include:  Changes in skin, such as: ? Cold and clammy skin. ? Blotchy (mottled) or pale skin. ? Skin that does not quickly return to normal after being lightly pinched and let go (poor skin turgor).  Changes in body fluids, such as: ? Feeling very thirsty. ? Your eyes making fewer tears. ? Not sweating when body temperature is high, such as in hot weather. ? Your body making very little pee.  Changes in vital signs, such as: ? Weak pulse. ? Pulse that is more than 100 beats a minute when you are sitting still. ? Fast breathing. ? Low blood pressure.  Other changes, such as: ? Sunken eyes. ? Cold hands and feet. ? Confusion. ? Lack of energy (lethargy). ? Trouble waking up from sleep. ? Short-term weight loss. ? Unconsciousness. Follow these instructions at home:   If told by your doctor, drink an ORS: ? Make an ORS by using instructions on the package. ? Start by drinking small amounts, about  cup (120 mL) every 5-10 minutes. ? Slowly drink more until you have had the amount that your doctor said to have.  Drink enough clear fluid to keep your pee clear or pale yellow. If you were told to drink an ORS, finish  the ORS first, then start slowly drinking clear fluids. Drink fluids such as: ? Water. Do not drink only water by itself. Doing that can make the salt (sodium) level in your body get too low (hyponatremia). ? Ice chips. ? Fruit juice that you have added water to (diluted). ? Low-calorie sports drinks.  Avoid: ? Alcohol. ? Drinks that have a lot of sugar. These include high-calorie sports drinks, fruit juice that does not have water added, and soda. ? Caffeine. ? Foods that are greasy or have a lot of fat or sugar.  Take over-the-counter and prescription medicines only as told by your doctor.  Do not take salt tablets. Doing that can make the salt level in your body get too high (hypernatremia).  Eat foods that have minerals (electrolytes). Examples include bananas, oranges, potatoes, tomatoes, and spinach.  Keep all follow-up visits as told by your doctor. This is important. Contact a doctor if:  You have belly (abdominal) pain that: ? Gets worse. ? Stays in one area (localizes).  You have a rash.  You have a stiff neck.  You get angry or annoyed more easily than normal (irritability).  You are more sleepy than normal.  You have a harder time waking up than normal.  You feel: ? Weak. ? Dizzy. ? Very thirsty.  You have peed (urinated) only a small amount of very dark pee during 6-8 hours. Get help right away if:  You   have symptoms of very bad dehydration.  You cannot drink fluids without throwing up (vomiting).  Your symptoms get worse with treatment.  You have a fever.  You have a very bad headache.  You are throwing up or having watery poop (diarrhea) and it: ? Gets worse. ? Does not go away.  You have blood or something green (bile) in your throw-up.  You have blood in your poop (stool). This may cause poop to look black and tarry.  You have not peed in 6-8 hours.  You pass out (faint).  Your heart rate when you are sitting still is more than 100  beats a minute.  You have trouble breathing. This information is not intended to replace advice given to you by your health care provider. Make sure you discuss any questions you have with your health care provider. Document Released: 03/05/2009 Document Revised: 11/27/2015 Document Reviewed: 07/03/2015 Elsevier Interactive Patient Education  2019 Elsevier Inc.  Coronavirus (COVID-19) Are you at risk?  Are you at risk for the Coronavirus (COVID-19)?  To be considered HIGH RISK for Coronavirus (COVID-19), you have to meet the following criteria:  . Traveled to [place], [place], [place], [place] or [place]; or in the [place] to [place], [place], [place], or [place]; and have fever, cough, and shortness of breath within the last 2 weeks of travel OR . Been in close contact with a person diagnosed with COVID-19 within the last 2 weeks and have fever, cough, and shortness of breath . IF YOU DO NOT MEET THESE CRITERIA, YOU ARE CONSIDERED LOW RISK FOR COVID-19.  What to do if you are HIGH RISK for COVID-19?  . If you are having a medical emergency, call 911. . Seek medical care right away. Before you go to a doctor's office, urgent care or emergency department, call ahead and tell them about your recent travel, contact with someone diagnosed with COVID-19, and your symptoms. You should receive instructions from your physician's office regarding next steps of care.  . When you arrive at healthcare provider, tell the healthcare staff immediately you have returned from visiting [place], [place], [place], [place] or [place]; or traveled in the [place] to [place], [place], [place], or [place]; in the last two weeks or you have been in close contact with a person diagnosed with COVID-19 in the last 2 weeks.   . Tell the health care staff about your symptoms: fever, cough and shortness of breath. . After you have been seen by a medical provider, you will be either: o Tested for  (COVID-19) and discharged home on quarantine except to seek medical care if symptoms worsen, and asked to  - Stay home and avoid contact with others until you get your results (4-5 days)  - Avoid travel on public transportation if possible (such as bus, train, or airplane) or o Sent to the Emergency Department by EMS for evaluation, COVID-19 testing, and possible admission depending on your condition and test results.  What to do if you are LOW RISK for COVID-19?  Reduce your risk of any infection by using the same precautions used for avoiding the common cold or flu:  . Wash your hands often with soap and warm water for at least 20 seconds.  If soap and water are not readily available, use an alcohol-based hand sanitizer with at least 60% alcohol.  . If coughing or sneezing, cover your mouth and nose by coughing or sneezing into the elbow areas   of your shirt or coat, into a tissue or into your sleeve (not your hands). . Avoid shaking hands with others and consider head nods or verbal greetings only. . Avoid touching your eyes, nose, or mouth with unwashed hands.  . Avoid close contact with people who are sick. . Avoid places or events with large numbers of people in one location, like concerts or sporting events. . Carefully consider travel plans you have or are making. . If you are planning any travel outside or inside the [place], visit the CDC's Travelers' Health webpage for the latest health notices. . If you have some symptoms but not all symptoms, continue to monitor at home and seek medical attention if your symptoms worsen. . If you are having a medical emergency, call 911.   ADDITIONAL HEALTHCARE OPTIONS FOR PATIENTS  [facility] / e-Visit: [email or web address]         [facility]: [phone]  [facility]: [phone]                   [facility]: [phone]  

## 2018-11-05 ENCOUNTER — Telehealth: Payer: Self-pay | Admitting: Hematology

## 2018-11-05 NOTE — Telephone Encounter (Signed)
Scheduled appt per 6/12 los. Left a voice message of appt date and time. °

## 2018-11-06 ENCOUNTER — Ambulatory Visit: Payer: BC Managed Care – PPO

## 2018-11-06 ENCOUNTER — Other Ambulatory Visit: Payer: Self-pay

## 2018-11-06 DIAGNOSIS — R1313 Dysphagia, pharyngeal phase: Secondary | ICD-10-CM

## 2018-11-06 NOTE — Therapy (Signed)
[facility] [address] [place], [place], [address] Phone: [phone]   Fax:  [phone]  Speech Language Pathology Treatment  Patient Details  Name: Ronald Ruiz MRN: [id number] Date of Birth: 1956-02-15 Referring Provider (SLP): Eppie Gibson MD   Encounter Date: 11/06/2018  End of Session - 11/06/18 1631    Visit Number  2    Number of Visits  7    Date for SLP Re-Evaluation  03/04/19    SLP Start Time  4193    SLP Stop Time   1440    SLP Time Calculation (min)  28 min    Activity Tolerance  Patient tolerated treatment well       Past Medical History:  Diagnosis Date  . Bronchitis 03/2018  . COPD (chronic obstructive pulmonary disease) ([place])   . Herpes   . laryngeal ca dx'd 06/2018  . Lung nodule 2020  . Substance abuse ([place])    opiate addiction, been on methadone for 11 years    Past Surgical History:  Procedure Laterality Date  . DIRECT LARYNGOSCOPY N/A 07/21/2018   Procedure: DIRECT LARYNGOSCOPY, TRACHEOSTOMY, BIOPSY;  Surgeon: Leta Baptist, MD;  Location: [facility];  Service: ENT;  Laterality: N/A;  . IR GASTROSTOMY TUBE MOD SED  08/02/2018    There were no vitals filed for this visit.  Subjective Assessment - 11/06/18 1411    Subjective  Pt states he is swallowing water "A little bit every day." (no more than 16 oz/day)    Currently in Pain?  No/denies            ADULT SLP TREATMENT - 11/06/18 1414      General Information   Behavior/Cognition  Alert;Cooperative;Pleasant mood      Treatment Provided   Treatment provided  Dysphagia      Dysphagia Treatment   Other treatment/comments  "If I take too much (water) sometimes it'll bounce back at me. I try not to take any big gulps." SLP encouraged pt to cont to take 1-2 cups water/day. Pt with pulmonary infection, but does not look like it was called a PNA. Pt's daughter is an Therapist, sports and "has hit me hard about the PNA - not wanting me to take too  much by mouth." SLP discussed the importance of smaller sips and maintaining proper pulmonary hygiene, and sent pt s/sx aspiration PNA via email today. Pt told SLP he was doing the HEP but a fraction of reps. SLP told pt that as he continues to heal he should incr reps of each exercise.        SLP Education - 11/06/18 1631    Education Details  overt s/sx aspiration PNA    Person(s) Educated  Patient    Methods  Explanation    Comprehension  Verbalized understanding       SLP Short Term Goals - 11/06/18 1634      SLP SHORT TERM GOAL #1   Title  pt will describe knowledge of how to complete HEP accurately    Time  1    Period  --   visit (visit #2)   Status  Achieved      SLP SHORT TERM GOAL #2   Title  pt will tell SLP why pt is completing HEP, with modified independence    Time  1    Period  --   visit (visit #2)   Status  On-going      SLP SHORT TERM GOAL #3  Title  pt will describe 3 overt s/s aspiration PNA with modified independence    Status  Achieved       SLP Long Term Goals - 11/06/18 1634      SLP LONG TERM GOAL #1   Title  pt will describe knowledge of how to complete HEP correctly over two visits    Time  3    Period  --   visits (visit #4)   Status  New      SLP LONG TERM GOAL #2   Title  pt will tell SLP how a food journal can facilitate return to a more normalized diet    Time  3    Period  --   visits (visit #4)   Status  New      SLP LONG TERM GOAL #3   Title  pt will describe how to modify HEP and the timeline associated with reduction in HEP frequency with modified independence over two sessions    Time  --   4   Period  --   visits   Status  New       Plan - 11/06/18 1632    Clinical Impression Statement  This visit was attempted [employer] however pt volume was nonfunctional so this visit took place via telephone due to medical director for head/neck cancer Dr. Eppie Gibson requesting all ancillary visits be done remotely for pt safety due  to COVID-19 pandemic. At this time pt does not report any overt s/s aspiration PNA. 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 SLP learned pt has decr'd reps in his HEP, however is still completing as best he can. SLP told him that as he improves he should attempt more reps up to the prescribed amount. Pt will cont to need to be seen by SLP for assessing safety of PO intake and assessing correct completion of HEP. Visits will be conducted over telephone/telehealth with possible transition back to in-person visits in the future at the discretion of medical director.    Speech Therapy Frequency  --   once every approx 4 weeks   Duration  --   7 total visits   Treatment/Interventions  Aspiration precaution training;Pharyngeal strengthening exercises;Diet toleration management by SLP;Compensatory techniques;SLP instruction and feedback;Patient/family education;Trials of upgraded texture/liquids    Potential to Achieve Goals  Good    SLP Home Exercise Plan  provided today    Consulted and Agree with Plan of Care  Patient       Patient will benefit from skilled therapeutic intervention in order to improve the following deficits and impairments:   1. Dysphagia, pharyngeal phase       Problem List Patient Active Problem List   Diagnosis Date Noted  . Counseling regarding advance care planning and goals of care 08/23/2018  . COPD with chronic bronchitis ([facility]) 07/31/2018  . Dysphagia 07/31/2018  . Laryngeal mass 07/31/2018  . Substance abuse ([place]) 07/31/2018  . Protein-calorie malnutrition, severe 07/23/2018  . Malignant neoplasm of glottis ([place]) 07/21/2018    [facility] ,[place], [place]  11/06/2018, 4:35 PM  [facility] [address] [place], [place],  [address] Phone: [phone]   Fax:  [phone]   Name: Yaacov Koziol MRN: [id number] Date of Birth: 03-17-1956

## 2018-11-08 ENCOUNTER — Encounter: Payer: Self-pay | Admitting: General Practice

## 2018-11-08 NOTE — Progress Notes (Signed)
[facility] CSW Progress Notes  Pt called at suggestion of Dr Irene Limbo - needs help with transitioning from short term to long term disability.  Company needs information from [employer].  CSW referred patient to Thompson Caul, [facility] staff responsible for FMLA and disability paperwork.  Also left VM for desk nurse w this information - patient bringing paperwork to appt tomorrow.  Edwyna Shell, LCSW Clinical Social Worker Phone:  [phone]

## 2018-11-09 ENCOUNTER — Inpatient Hospital Stay: Payer: BC Managed Care – PPO

## 2018-11-09 ENCOUNTER — Other Ambulatory Visit: Payer: Self-pay

## 2018-11-09 DIAGNOSIS — C320 Malignant neoplasm of glottis: Secondary | ICD-10-CM | POA: Diagnosis not present

## 2018-11-09 DIAGNOSIS — E860 Dehydration: Secondary | ICD-10-CM

## 2018-11-09 MED ORDER — SODIUM CHLORIDE 0.9 % IV SOLN
INTRAVENOUS | Status: DC
  Administered 2018-11-09: 09:00:00 via INTRAVENOUS
  Filled 2018-11-09 (×3): qty 250

## 2018-11-09 NOTE — Patient Instructions (Signed)
Dehydration, Adult  Dehydration is when there is not enough fluid or water in your body. This happens when you lose more fluids than you take in. Dehydration can range from mild to very bad. It should be treated right away to keep it from getting very bad. Symptoms of mild dehydration may include:  Thirst.  Dry lips.  Slightly dry mouth.  Dry, warm skin.  Dizziness. Symptoms of moderate dehydration may include:  Very dry mouth.  Muscle cramps.  Dark pee (urine). Pee may be the color of tea.  Your body making less pee.  Your eyes making fewer tears.  Heartbeat that is uneven or faster than normal (palpitations).  Headache.  Light-headedness, especially when you stand up from sitting.  Fainting (syncope). Symptoms of very bad dehydration may include:  Changes in skin, such as: ? Cold and clammy skin. ? Blotchy (mottled) or pale skin. ? Skin that does not quickly return to normal after being lightly pinched and let go (poor skin turgor).  Changes in body fluids, such as: ? Feeling very thirsty. ? Your eyes making fewer tears. ? Not sweating when body temperature is high, such as in hot weather. ? Your body making very little pee.  Changes in vital signs, such as: ? Weak pulse. ? Pulse that is more than 100 beats a minute when you are sitting still. ? Fast breathing. ? Low blood pressure.  Other changes, such as: ? Sunken eyes. ? Cold hands and feet. ? Confusion. ? Lack of energy (lethargy). ? Trouble waking up from sleep. ? Short-term weight loss. ? Unconsciousness. Follow these instructions at home:   If told by your doctor, drink an ORS: ? Make an ORS by using instructions on the package. ? Start by drinking small amounts, about  cup (120 mL) every 5-10 minutes. ? Slowly drink more until you have had the amount that your doctor said to have.  Drink enough clear fluid to keep your pee clear or pale yellow. If you were told to drink an ORS, finish the  ORS first, then start slowly drinking clear fluids. Drink fluids such as: ? Water. Do not drink only water by itself. Doing that can make the salt (sodium) level in your body get too low (hyponatremia). ? Ice chips. ? Fruit juice that you have added water to (diluted). ? Low-calorie sports drinks.  Avoid: ? Alcohol. ? Drinks that have a lot of sugar. These include high-calorie sports drinks, fruit juice that does not have water added, and soda. ? Caffeine. ? Foods that are greasy or have a lot of fat or sugar.  Take over-the-counter and prescription medicines only as told by your doctor.  Do not take salt tablets. Doing that can make the salt level in your body get too high (hypernatremia).  Eat foods that have minerals (electrolytes). Examples include bananas, oranges, potatoes, tomatoes, and spinach.  Keep all follow-up visits as told by your doctor. This is important. Contact a doctor if:  You have belly (abdominal) pain that: ? Gets worse. ? Stays in one area (localizes).  You have a rash.  You have a stiff neck.  You get angry or annoyed more easily than normal (irritability).  You are more sleepy than normal.  You have a harder time waking up than normal.  You feel: ? Weak. ? Dizzy. ? Very thirsty.  You have peed (urinated) only a small amount of very dark pee during 6-8 hours. Get help right away if:  You have   symptoms of very bad dehydration.  You cannot drink fluids without throwing up (vomiting).  Your symptoms get worse with treatment.  You have a fever.  You have a very bad headache.  You are throwing up or having watery poop (diarrhea) and it: ? Gets worse. ? Does not go away.  You have blood or something green (bile) in your throw-up.  You have blood in your poop (stool). This may cause poop to look black and tarry.  You have not peed in 6-8 hours.  You pass out (faint).  Your heart rate when you are sitting still is more than 100 beats a  minute.  You have trouble breathing. This information is not intended to replace advice given to you by your health care provider. Make sure you discuss any questions you have with your health care provider. Document Released: 03/05/2009 Document Revised: 11/27/2015 Document Reviewed: 07/03/2015 Elsevier Interactive Patient Education  2019 [employer]vier Inc.  

## 2018-11-12 ENCOUNTER — Inpatient Hospital Stay: Payer: BC Managed Care – PPO

## 2018-11-12 ENCOUNTER — Other Ambulatory Visit: Payer: Self-pay

## 2018-11-12 VITALS — BP 104/60 | HR 59 | Temp 98.0°F | Resp 18

## 2018-11-12 DIAGNOSIS — E860 Dehydration: Secondary | ICD-10-CM

## 2018-11-12 DIAGNOSIS — C320 Malignant neoplasm of glottis: Secondary | ICD-10-CM | POA: Diagnosis not present

## 2018-11-12 MED ORDER — SODIUM CHLORIDE 0.9 % IV SOLN
INTRAVENOUS | Status: AC
  Administered 2018-11-12: 09:00:00 via INTRAVENOUS
  Filled 2018-11-12 (×2): qty 250

## 2018-11-12 NOTE — Patient Instructions (Signed)
Dehydration, Adult  Dehydration is when there is not enough fluid or water in your body. This happens when you lose more fluids than you take in. Dehydration can range from mild to very bad. It should be treated right away to keep it from getting very bad. Symptoms of mild dehydration may include:  Thirst.  Dry lips.  Slightly dry mouth.  Dry, warm skin.  Dizziness. Symptoms of moderate dehydration may include:  Very dry mouth.  Muscle cramps.  Dark pee (urine). Pee may be the color of tea.  Your body making less pee.  Your eyes making fewer tears.  Heartbeat that is uneven or faster than normal (palpitations).  Headache.  Light-headedness, especially when you stand up from sitting.  Fainting (syncope). Symptoms of very bad dehydration may include:  Changes in skin, such as: ? Cold and clammy skin. ? Blotchy (mottled) or pale skin. ? Skin that does not quickly return to normal after being lightly pinched and let go (poor skin turgor).  Changes in body fluids, such as: ? Feeling very thirsty. ? Your eyes making fewer tears. ? Not sweating when body temperature is high, such as in hot weather. ? Your body making very little pee.  Changes in vital signs, such as: ? Weak pulse. ? Pulse that is more than 100 beats a minute when you are sitting still. ? Fast breathing. ? Low blood pressure.  Other changes, such as: ? Sunken eyes. ? Cold hands and feet. ? Confusion. ? Lack of energy (lethargy). ? Trouble waking up from sleep. ? Short-term weight loss. ? Unconsciousness. Follow these instructions at home:   If told by your doctor, drink an ORS: ? Make an ORS by using instructions on the package. ? Start by drinking small amounts, about  cup (120 mL) every 5-10 minutes. ? Slowly drink more until you have had the amount that your doctor said to have.  Drink enough clear fluid to keep your pee clear or pale yellow. If you were told to drink an ORS, finish the  ORS first, then start slowly drinking clear fluids. Drink fluids such as: ? Water. Do not drink only water by itself. Doing that can make the salt (sodium) level in your body get too low (hyponatremia). ? Ice chips. ? Fruit juice that you have added water to (diluted). ? Low-calorie sports drinks.  Avoid: ? Alcohol. ? Drinks that have a lot of sugar. These include high-calorie sports drinks, fruit juice that does not have water added, and soda. ? Caffeine. ? Foods that are greasy or have a lot of fat or sugar.  Take over-the-counter and prescription medicines only as told by your doctor.  Do not take salt tablets. Doing that can make the salt level in your body get too high (hypernatremia).  Eat foods that have minerals (electrolytes). Examples include bananas, oranges, potatoes, tomatoes, and spinach.  Keep all follow-up visits as told by your doctor. This is important. Contact a doctor if:  You have belly (abdominal) pain that: ? Gets worse. ? Stays in one area (localizes).  You have a rash.  You have a stiff neck.  You get angry or annoyed more easily than normal (irritability).  You are more sleepy than normal.  You have a harder time waking up than normal.  You feel: ? Weak. ? Dizzy. ? Very thirsty.  You have peed (urinated) only a small amount of very dark pee during 6-8 hours. Get help right away if:  You have   symptoms of very bad dehydration.  You cannot drink fluids without throwing up (vomiting).  Your symptoms get worse with treatment.  You have a fever.  You have a very bad headache.  You are throwing up or having watery poop (diarrhea) and it: ? Gets worse. ? Does not go away.  You have blood or something green (bile) in your throw-up.  You have blood in your poop (stool). This may cause poop to look black and tarry.  You have not peed in 6-8 hours.  You pass out (faint).  Your heart rate when you are sitting still is more than 100 beats a  minute.  You have trouble breathing. This information is not intended to replace advice given to you by your health care provider. Make sure you discuss any questions you have with your health care provider. Document Released: 03/05/2009 Document Revised: 11/27/2015 Document Reviewed: 07/03/2015 Elsevier Interactive Patient Education  2019 [employer]vier Inc.  

## 2018-11-16 ENCOUNTER — Inpatient Hospital Stay: Payer: BC Managed Care – PPO

## 2018-11-16 ENCOUNTER — Other Ambulatory Visit: Payer: Self-pay

## 2018-11-16 VITALS — BP 112/58 | HR 50 | Temp 98.9°F | Resp 17

## 2018-11-16 DIAGNOSIS — C320 Malignant neoplasm of glottis: Secondary | ICD-10-CM | POA: Diagnosis not present

## 2018-11-16 MED ORDER — SODIUM CHLORIDE 0.9 % IV SOLN
Freq: Once | INTRAVENOUS | Status: AC
  Administered 2018-11-16: 09:00:00 via INTRAVENOUS
  Filled 2018-11-16: qty 250

## 2018-11-16 NOTE — Patient Instructions (Signed)
Dehydration, Adult  Dehydration is when there is not enough fluid or water in your body. This happens when you lose more fluids than you take in. Dehydration can range from mild to very bad. It should be treated right away to keep it from getting very bad. Symptoms of mild dehydration may include:  Thirst.  Dry lips.  Slightly dry mouth.  Dry, warm skin.  Dizziness. Symptoms of moderate dehydration may include:  Very dry mouth.  Muscle cramps.  Dark pee (urine). Pee may be the color of tea.  Your body making less pee.  Your eyes making fewer tears.  Heartbeat that is uneven or faster than normal (palpitations).  Headache.  Light-headedness, especially when you stand up from sitting.  Fainting (syncope). Symptoms of very bad dehydration may include:  Changes in skin, such as: ? Cold and clammy skin. ? Blotchy (mottled) or pale skin. ? Skin that does not quickly return to normal after being lightly pinched and let go (poor skin turgor).  Changes in body fluids, such as: ? Feeling very thirsty. ? Your eyes making fewer tears. ? Not sweating when body temperature is high, such as in hot weather. ? Your body making very little pee.  Changes in vital signs, such as: ? Weak pulse. ? Pulse that is more than 100 beats a minute when you are sitting still. ? Fast breathing. ? Low blood pressure.  Other changes, such as: ? Sunken eyes. ? Cold hands and feet. ? Confusion. ? Lack of energy (lethargy). ? Trouble waking up from sleep. ? Short-term weight loss. ? Unconsciousness. Follow these instructions at home:   If told by your doctor, drink an ORS: ? Make an ORS by using instructions on the package. ? Start by drinking small amounts, about  cup (120 mL) every 5-10 minutes. ? Slowly drink more until you have had the amount that your doctor said to have.  Drink enough clear fluid to keep your pee clear or pale yellow. If you were told to drink an ORS, finish the  ORS first, then start slowly drinking clear fluids. Drink fluids such as: ? Water. Do not drink only water by itself. Doing that can make the salt (sodium) level in your body get too low (hyponatremia). ? Ice chips. ? Fruit juice that you have added water to (diluted). ? Low-calorie sports drinks.  Avoid: ? Alcohol. ? Drinks that have a lot of sugar. These include high-calorie sports drinks, fruit juice that does not have water added, and soda. ? Caffeine. ? Foods that are greasy or have a lot of fat or sugar.  Take over-the-counter and prescription medicines only as told by your doctor.  Do not take salt tablets. Doing that can make the salt level in your body get too high (hypernatremia).  Eat foods that have minerals (electrolytes). Examples include bananas, oranges, potatoes, tomatoes, and spinach.  Keep all follow-up visits as told by your doctor. This is important. Contact a doctor if:  You have belly (abdominal) pain that: ? Gets worse. ? Stays in one area (localizes).  You have a rash.  You have a stiff neck.  You get angry or annoyed more easily than normal (irritability).  You are more sleepy than normal.  You have a harder time waking up than normal.  You feel: ? Weak. ? Dizzy. ? Very thirsty.  You have peed (urinated) only a small amount of very dark pee during 6-8 hours. Get help right away if:  You have   symptoms of very bad dehydration.  You cannot drink fluids without throwing up (vomiting).  Your symptoms get worse with treatment.  You have a fever.  You have a very bad headache.  You are throwing up or having watery poop (diarrhea) and it: ? Gets worse. ? Does not go away.  You have blood or something green (bile) in your throw-up.  You have blood in your poop (stool). This may cause poop to look black and tarry.  You have not peed in 6-8 hours.  You pass out (faint).  Your heart rate when you are sitting still is more than 100 beats a  minute.  You have trouble breathing. This information is not intended to replace advice given to you by your health care provider. Make sure you discuss any questions you have with your health care provider. Document Released: 03/05/2009 Document Revised: 11/27/2015 Document Reviewed: 07/03/2015 Elsevier Interactive Patient Education  2019 [employer]vier Inc.  

## 2018-11-19 ENCOUNTER — Inpatient Hospital Stay: Payer: BC Managed Care – PPO

## 2018-11-19 ENCOUNTER — Other Ambulatory Visit: Payer: Self-pay

## 2018-11-19 ENCOUNTER — Encounter: Payer: Self-pay | Admitting: Pulmonary Disease

## 2018-11-19 ENCOUNTER — Ambulatory Visit (INDEPENDENT_AMBULATORY_CARE_PROVIDER_SITE_OTHER): Payer: BC Managed Care – PPO | Admitting: Pulmonary Disease

## 2018-11-19 VITALS — BP 122/58 | HR 64 | Temp 97.9°F | Ht 72.0 in | Wt 136.6 lb

## 2018-11-19 VITALS — BP 119/61 | HR 52 | Temp 99.1°F | Resp 16

## 2018-11-19 DIAGNOSIS — R911 Solitary pulmonary nodule: Secondary | ICD-10-CM | POA: Diagnosis not present

## 2018-11-19 DIAGNOSIS — E860 Dehydration: Secondary | ICD-10-CM

## 2018-11-19 DIAGNOSIS — C320 Malignant neoplasm of glottis: Secondary | ICD-10-CM | POA: Diagnosis not present

## 2018-11-19 MED ORDER — SODIUM CHLORIDE 0.9 % IV SOLN
INTRAVENOUS | Status: DC
  Administered 2018-11-19 (×2): via INTRAVENOUS
  Filled 2018-11-19 (×3): qty 250

## 2018-11-19 NOTE — Patient Instructions (Signed)
I am glad you are doing well with your breathing and after your recent surgery We will order CT chest without contrast in 6 months time Follow-up in clinic after CT chest

## 2018-11-19 NOTE — Progress Notes (Addendum)
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  Had a PEG tube placement on 07/24/2024.  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  [name] is currently capped States that his breathing is doing well with no issues  Had a CT at end of May which showed tree-in-bud opacity.  He was treated with doxycycline.  Outpatient Encounter Medications as of 11/19/2018  Medication Sig  . budesonide (PULMICORT) 0.5 MG/2ML nebulizer solution INHALE CONTENTS OF 1 VIAL VIA NEBULIZER 2 TIMES DAILY  . ipratropium-albuterol (DUONEB) 0.5-2.5 (3) MG/3ML SOLN TAKE 3 MLS BY NEBULIZATION EVERY 6 (SIX) HOURS AS NEEDED.  . methadone (DOLOPHINE) 10 MG/ML solution Take 110 mg by mouth daily.  . Nutritional Supplements (FEEDING SUPPLEMENT, OSMOLITE 1.5 CAL,) LIQD Give 410 ml 4 times daily via feeding tube (7 cartons daily needed)  . Nutritional Supplements (PROMOD) LIQD Give 30 mL Promod twice daily via PEG.  . PERFOROMIST 20 MCG/2ML nebulizer solution INHALE CONTENTS OF 1 VIAL VIA NEBULIZER 2 TIMES DAILY  . Water For Irrigation, Sterile (FREE WATER) SOLN Place 300 mLs into feeding  tube every 8 (eight) hours.  Marland Kitchen albuterol (PROVENTIL HFA;VENTOLIN HFA) 108 (90 Base) MCG/ACT inhaler Inhale 1-2 puffs into the lungs every 6 (six) hours as needed for wheezing.   Marland Kitchen ibuprofen (ADVIL,MOTRIN) 100 MG/5ML suspension Place 20-30 mLs (400-600 mg total) into feeding tube every 8 (eight) hours as needed for mild pain.  Marland Kitchen ondansetron (ZOFRAN) 4 MG/5ML solution Place 10 mLs (8 mg total) into feeding tube 2 (two) times daily as needed for nausea or vomiting. (Patient not taking: Reported on 11/19/2018)  . prochlorperazine (COMPAZINE) 10 MG tablet Take 1 tablet (10 mg total) by mouth every 6 (six) hours as needed (Nausea or vomiting). (Patient not taking: Reported on 10/31/2018)  . prochlorperazine (COMPAZINE) 5 MG tablet Place 1-2 tablets (5-10 mg total) into feeding tube every 6 (six) hours as needed for nausea or vomiting. (Patient not taking: Reported on 11/19/2018)  . sucralfate (CARAFATE) 1 GM/10ML suspension TAKE 10 MLS (1 G TOTAL) BY MOUTH 2 (TWO) TIMES DAILY. (Patient not taking: Reported on 10/31/2018)  . [DISCONTINUED] dexamethasone (DEXAMETHASONE INTENSOL) 1 MG/ML solution Place 8 mLs (8 mg total) into feeding tube daily. Take 8 mg total by mouth daily starting the day after chemotherapy for 2 days (Patient not taking: Reported on 10/31/2018)  . [DISCONTINUED] lidocaine (XYLOCAINE) 2 % solution Patient: Mix 1part 2% viscous lidocaine, 1part H20. Swallow 53mL of diluted mixture, 53min before meals and at bedtime, up to QID (Patient not taking: Reported on 10/31/2018)  . [DISCONTINUED] lidocaine-prilocaine (EMLA) cream Apply to affected area once  . [DISCONTINUED] nicotine (NICODERM CQ - DOSED IN MG/24  HOURS) 21 mg/24hr patch Place 1 patch (21 mg total) onto the skin daily.   No facility-administered encounter medications on file as of 11/19/2018.    Physical Exam: Blood pressure 106/60, pulse 72, height 6' (1.829 m), weight 153 lb 3.2 oz (69.5 kg), SpO2 98 %. Gen:      No acute distress  HEENT:  EOMI, sclera anicteric Neck:     No masses; no thyromegaly, trach  Lungs:    Clear to auscultation bilaterally; normal respiratory effort CV:         Regular rate and rhythm; no murmurs Abd:      + bowel sounds; soft, non-tender; no palpable masses, no distension Ext:    No edema; adequate peripheral perfusion Skin:      Warm and dry; no rash Neuro: alert and oriented x 3 Psych: normal mood and affect  Data Reviewed: Imaging: CT chest 07/03/2018-mild centrilobular emphysema, fine nodularity in the lower lobes, middle lobe and lingula.  1.1 irregular elongated nodule in the left lower lobe, 1 cm pulmonary nodule in the right middle lobe.  I have reviewed the images personally.   PET scan 07/17/2018- no significant PET uptake in the right middle lobe and left lower lobe nodule.  Hypermetabolism in the left vocal cord.  Emphysema and bronchial wall thickening  CT chest 10/16/2018-Tree in bud opacity, bronchial wall thickening with mucous plugging.  Lung nodules are stable.  Reviewed images personally.  FENO 07/06/2018-11   Labs CBC 11/02/2018-WBC 7.3, eos 2% and absolute eosinophil count 146 IgE 07/06/2018- 106 Alpha-1 antitrypsin 07/06/2018-199, PIMM   Assessment:  Chronic bronchitis, emphysema Likely has COPD based on his presentation and smoking history. Hold off on PFTs due to COVID restrictions and also he has a trach in place.  Continue Pulmicort, Brovana nebs.  Lung nodules Have remained stable on CT imaging.  No significant uptake on PET scan We will continue to follow this. CT chest without contrast in 6 months.  Plan/Recommendations: - Continue Brovana, Pulmicort - Follow-up CT chest in 6 months  Marshell Garfinkel MD [facility] 11/19/2018, 9:06 AM  CC: [name], MD

## 2018-11-21 NOTE — Progress Notes (Signed)
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  The patient was undergoing outpatient evaluation for lung nodules and a vocal cord lesion and presented to the emergency room with worsening dyspnea.  ENT was consulted for his vocal cord mass.  Due to the location of the laryngeal mass, the patient was in danger of developing an upper airway obstruction.  A tracheostomy was placed to protect his airway.  A biopsy of the mass was performed.  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  The patient tells me that he was having persistent upper respiratory symptoms with dyspnea and cough since about November 2019.  He was on several rounds of antibiotics and steroids without significant improvement.  He reports a 20-month history of hoarseness in his voice.  He has lost about 40 pounds over the past 6 months.  He also reports having a sore throat.  Denies difficulty swallowing to admission.  Breathing is better since placement of tracheostomy.  He has not had any fevers or chills.  Denies chest pain.  Denies nausea, vomiting, constipation, diarrhea.  He is on NG tube feeding at this time.  Working with speech therapy on swallowing.  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  The patient is widowed and currently lives alone.  Wife died about 15 months ago secondary to lung cancer. Upon discharge, he plans to go live with 1 of his daughters in [place], [place].  He has 3 daughters who live locally.  1  of his daughters is an Therapist, sports who works on the oncology unit at [employer].  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 The patient's last visit with [place] was on 11/02/18. The pt reports that he is doing well overall.  The pt reports that he has been maintaining 100% of his calculated PEG tube feeding and has gained two pounds in the interim. He notes that he is swallowing well and has been actively seeing speech therapy and is swallowing water. The pt will follow up with speech therapy again at the end of August. He denies throat soreness. He notes that he coughs sometimes while swallowing, but feels that this happens more when his throat is dry. He denies abdominal pains. He is taking about 4-5 oz of water PO each day. He notes that he is moving his bowels well. He denies any pain around his feeding tube.  The pt notes that he is slowly increasing his activity levels. He has also followed up with Pulmonology. The pt notes that he has continued with his absolute smoking cessation.   Lab results today (11/22/18) of CBC w/diff and CMP is as follows: all values are WNL except for RBC at 2.86, HGB at 9.4, HCT at 30.2, MCV at 105.6, Albumin at 2.8, Total Bilirubin at 0.2.  On review of systems, pt reports consuming more, mild weight gain, moving his bowels well, increasing activity, strengthening voice, decreased secretions, and denies throat pain, abdominal pains, choking, leg  swelling, pain around feeding tube, and any other symptoms.   MEDICAL HISTORY:  Past Medical History:  Diagnosis Date  . Bronchitis 03/2018  . COPD (chronic obstructive pulmonary disease) ([place])   . Herpes   . laryngeal ca dx'd 06/2018  . Lung nodule 2020  . Substance abuse ([place])    opiate addiction, been on methadone for 11 years    SURGICAL HISTORY: Past Surgical History:  Procedure Laterality Date  . DIRECT LARYNGOSCOPY  N/A 07/21/2018   Procedure: DIRECT LARYNGOSCOPY, TRACHEOSTOMY, BIOPSY;  Surgeon: Leta Baptist, MD;  Location: [facility];  Service: ENT;  Laterality: N/A;  . IR GASTROSTOMY TUBE MOD SED  08/02/2018    SOCIAL HISTORY: Social History   Socioeconomic History  . Marital status: Widowed    Spouse name: Not on file  . Number of children: Not on file  . Years of education: Not on file  . Highest education level: Not on file  Occupational History  . Not on file  Social Needs  . Financial resource strain: Not on file  . Food insecurity    Worry: Not on file    Inability: Not on file  . Transportation needs    Medical: No    Non-medical: No  Tobacco Use  . Smoking status: Former Smoker    Packs/day: 2.00    Years: 50.00    Pack years: 100.00    Types: Cigarettes  . Smokeless tobacco: Never Used  . Tobacco comment: 07/06/18 at 10 cigs per day, quit 06/16/18  Substance and Sexual Activity  . Alcohol use: Not Currently  . Drug use: Not Currently    Comment: hx of opiod abuse  . Sexual activity: Not Currently    Birth control/protection: None  Lifestyle  . Physical activity    Days per week: Not on file    Minutes per session: Not on file  . Stress: Not on file  Relationships  . Social Herbalist on phone: Not on file    Gets together: Not on file    Attends religious service: Not on file    Active member of club or organization: Not on file    Attends meetings of clubs or organizations: Not on file    Relationship status: Not on file  . Intimate partner violence    Fear of current or ex partner: No    Emotionally abused: No    Physically abused: No    Forced sexual activity: No  Other Topics Concern  . Not on file  Social History Narrative  . Not on file    FAMILY HISTORY: Family History  Problem Relation Age of Onset  . Alcohol abuse Father   . Cancer Father   . Cirrhosis Father   . Drug abuse Brother     ALLERGIES:  has No Known Allergies.  MEDICATIONS:   Current Outpatient Medications  Medication Sig Dispense Refill  . albuterol (PROVENTIL HFA;VENTOLIN HFA) 108 (90 Base) MCG/ACT inhaler Inhale 1-2 puffs into the lungs every 6 (six) hours as needed for wheezing.     . budesonide (PULMICORT) 0.5 MG/2ML nebulizer solution INHALE CONTENTS OF 1 VIAL VIA NEBULIZER 2 TIMES DAILY 360 mL 5  . ibuprofen (ADVIL,MOTRIN) 100 MG/5ML suspension Place 20-30 mLs (400-600 mg total) into feeding tube every 8 (eight) hours as needed for mild pain. 300 mL 0  . ipratropium-albuterol (DUONEB) 0.5-2.5 (3) MG/3ML SOLN TAKE 3 MLS BY NEBULIZATION EVERY 6 (SIX) HOURS AS NEEDED. 1080 mL 2  .  methadone (DOLOPHINE) 10 MG/ML solution Take 110 mg by mouth daily.    . Nutritional Supplements (FEEDING SUPPLEMENT, OSMOLITE 1.5 CAL,) LIQD Give 410 ml 4 times daily via feeding tube (7 cartons daily needed) 1659 mL 0  . Nutritional Supplements (PROMOD) LIQD Give 30 mL Promod twice daily via PEG. 2 Bottle 2  . ondansetron (ZOFRAN) 4 MG/5ML solution Place 10 mLs (8 mg total) into feeding tube 2 (two) times daily as needed for nausea or vomiting. (Patient not taking: Reported on 11/19/2018) 100 mL 1  . PERFOROMIST 20 MCG/2ML nebulizer solution INHALE CONTENTS OF 1 VIAL VIA NEBULIZER 2 TIMES DAILY 360 mL 2  . prochlorperazine (COMPAZINE) 10 MG tablet Take 1 tablet (10 mg total) by mouth every 6 (six) hours as needed (Nausea or vomiting). (Patient not taking: Reported on 10/31/2018) 30 tablet 1  . prochlorperazine (COMPAZINE) 5 MG tablet Place 1-2 tablets (5-10 mg total) into feeding tube every 6 (six) hours as needed for nausea or vomiting. (Patient not taking: Reported on 11/19/2018) 30 tablet 0  . sucralfate (CARAFATE) 1 GM/10ML suspension TAKE 10 MLS (1 G TOTAL) BY MOUTH 2 (TWO) TIMES DAILY. (Patient not taking: Reported on 10/31/2018) 420 mL 0  . Water For Irrigation, Sterile (FREE WATER) SOLN Place 300 mLs into feeding tube every 8 (eight) hours. 5000 mL 0   Current Facility-Administered  Medications  Medication Dose Route Frequency Provider Last Rate Last Dose  . 0.9 %  sodium chloride infusion   Intravenous Continuous Irene Limbo, Cloria Spring, MD        REVIEW OF SYSTEMS:    A 10+ POINT REVIEW OF SYSTEMS WAS OBTAINED including neurology, dermatology, psychiatry, cardiac, respiratory, lymph, extremities, GI, GU, Musculoskeletal, constitutional, breasts, reproductive, HEENT.  All pertinent positives are noted in the HPI.  All others are negative.   PHYSICAL EXAMINATION: ECOG PERFORMANCE STATUS: 1 - Symptomatic but completely ambulatory  Vitals:   11/22/18 1111  BP: 96/65  Pulse: 60  Resp: 18  Temp: (!) 97.5 F (36.4 C)  SpO2: 100%   Filed Weights   11/22/18 1111  Weight: 138 lb 12.8 oz (63 kg)   .Body mass index is 18.82 kg/m.  GENERAL:alert, in no acute distress and comfortable SKIN: no acute rashes, no significant lesions EYES: conjunctiva are pink and non-injected, sclera anicteric OROPHARYNX: MMM, no exudates, no oropharyngeal erythema or ulceration NECK: supple, no JVD. Tracheostomy in situ. LYMPH:  no palpable lymphadenopathy in the cervical, axillary or inguinal regions LUNGS: clear to auscultation b/l with normal respiratory effort HEART: regular rate & rhythm ABDOMEN:  normoactive bowel sounds , non tender, not distended. No palpable hepatosplenomegaly. PEG tube in situ. Extremity: no pedal edema PSYCH: alert & oriented x 3 with fluent speech NEURO: no focal motor/sensory deficits   LABORATORY DATA:  I have reviewed the data as listed  . CBC Latest Ref Rng & Units 11/22/2018 11/02/2018 10/22/2018  WBC 4.0 - 10.5 K/uL 6.1 7.3 14.1(H)  Hemoglobin 13.0 - 17.0 g/dL 9.4(L) 9.6(L) 10.7(L)  Hematocrit 39.0 - 52.0 % 30.2(L) 30.3(L) 33.3(L)  Platelets 150 - 400 K/uL 171 290 229    . CMP Latest Ref Rng & Units 11/22/2018 11/02/2018 10/22/2018  Glucose 70 - 99 mg/dL 97 97 100(H)  BUN 8 - 23 mg/dL 12 18 16   Creatinine 0.61 - 1.24 mg/dL 0.89 0.84 0.78  Sodium  135 - 145 mmol/L 139 139 138  Potassium 3.5 - 5.1 mmol/L 4.3 4.1 4.1  Chloride 98 - 111 mmol/L 102 99 96(L)  CO2 22 - 32 mmol/L 30 30 31   Calcium 8.9 - 10.3 mg/dL 9.0 9.4 9.8  Total Protein 6.5 - 8.1 g/dL 7.5 7.9 7.6  Total Bilirubin 0.3 - 1.2 mg/dL 0.2(L) 0.3 0.4  Alkaline Phos 38 - 126 U/L 64 65 74  AST 15 - 41 U/L 16 15 16   ALT 0 - 44 U/L 11 11 14      RADIOGRAPHIC STUDIES: I have personally reviewed the radiological images as listed and agreed with the findings in the report. No results found.  ASSESSMENT & PLAN:   63 y.o. male with  1. 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  Discussed with Dr. Benjamine Mola at bedside patient is not a candidate for larynx sparing surgery. He has lost about 40 pounds and is nutritionally quite depleted. Heavy smoking history of 1 to 2 packs/day with COPD /emphysema.  Not on home oxygen. 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 Has tolerated his tracheostomy well and is been able to speak with Passy-Muir valve. Has had issues with swallowing and is still needing tube feeding.  07/17/18 PET/CT revealed he has multiple pulmonary nodules none of them are FDG avid. No FDG avid cervical lymphadenopathy. FDG avid laryngeal tumor noted.  10/16/18 CT Chest revealed "Since the prior CT, the patient has developed tree-in-bud type opacities, bronchial wall thickening, and bronchial mucous plugging, in the left lower lobe consistent with infection/inflammation. This is suspicious for MAI infection. The nodule previously measured in left lower lobe on the prior study has decreased in size as has and a smaller adjacent nodule. These findings all support a benign inflammatory etiology. 2. Nodules right middle lobe, with a dominant nodule measuring 1 cm, are all stable. 3.  Stable changes of centrilobular emphysema. 4. Three-vessel coronary artery calcifications and aortic atherosclerosis, also stable. Aortic Atherosclerosis and Emphysema."  S/p 6 weekly doses of Carboplatin and Taxol with concurrent RT, completed 10/16/18. He received IMRT total dose of 70 Gray in 35 fractions.  PLAN: -Discussed pt labwork today, 11/22/18; WBC normalized, other blood counts stable. No dehydration at this time. -Will continue IVF twice a week over the next two weeks, and then will attempt to transition -Will repeat modified barium swallow study in 3 weeks, which will be roughly 8 weeks post radiation therapy -No limiting discomfort in throat at this time -Recommend returning to care with ENT Dr. Benjamine Mola soon in the next 3-4 weeks, and if pt can have tracheostomy removed -Will repeat PET/CT in the next 7 weeks -Discussed again the priority at this point is to focus on nutrition, staying active, and staying well hydrated -Recommend following up with Pulmonology for management and evaluation of secretions, could be role for Pulmonary dornase . Mucomyst inhalation or flutter valve? -Continue Sucralfate, Biotene, Lidocaine, and salt and baking soda mouthwashes -Following with Dr Isidore Moos in [place] with absolute smoking cessation -Will see the pt back in 4 weeks   -Continue IV fluid NS 2L on Monday and Friday for 2 additional weeks -PET/CT in 7 weeks -RTC with Dr Irene Limbo with labs in 4 weeks and 8 weeks with labs -Modified barium swallow in 4 weeks   All of the patients questions were answered with apparent satisfaction. The patient knows to call the clinic with any problems, questions or concerns.  The total time spent in  the appt was 25 minutes and more than 50% was on counseling and direct patient cares.    Sullivan Lone MD MS AAHIVMS [facility] Hematology/Oncology Physician [facility]  (Office):       [phone] (Work cell):  [phone] (Fax):            [phone]  11/22/2018 11:37 AM  I, Baldwin Jamaica, am acting as a scribe for Dr. Sullivan Lone.   .I have reviewed the above documentation for accuracy and completeness, and I agree with the above. [name] MD

## 2018-11-22 ENCOUNTER — Inpatient Hospital Stay (HOSPITAL_BASED_OUTPATIENT_CLINIC_OR_DEPARTMENT_OTHER): Payer: BC Managed Care – PPO | Admitting: Hematology

## 2018-11-22 ENCOUNTER — Inpatient Hospital Stay: Payer: BC Managed Care – PPO | Attending: Hematology

## 2018-11-22 ENCOUNTER — Other Ambulatory Visit: Payer: Self-pay

## 2018-11-22 ENCOUNTER — Inpatient Hospital Stay: Payer: BC Managed Care – PPO

## 2018-11-22 VITALS — BP 113/60

## 2018-11-22 VITALS — BP 96/65 | HR 60 | Temp 97.5°F | Resp 18 | Ht 72.0 in | Wt 138.8 lb

## 2018-11-22 DIAGNOSIS — I700 Atherosclerosis of aorta: Secondary | ICD-10-CM | POA: Insufficient documentation

## 2018-11-22 DIAGNOSIS — Z801 Family history of malignant neoplasm of trachea, bronchus and lung: Secondary | ICD-10-CM

## 2018-11-22 DIAGNOSIS — Z79899 Other long term (current) drug therapy: Secondary | ICD-10-CM

## 2018-11-22 DIAGNOSIS — E860 Dehydration: Secondary | ICD-10-CM | POA: Insufficient documentation

## 2018-11-22 DIAGNOSIS — Z930 Tracheostomy status: Secondary | ICD-10-CM | POA: Diagnosis not present

## 2018-11-22 DIAGNOSIS — C320 Malignant neoplasm of glottis: Secondary | ICD-10-CM

## 2018-11-22 DIAGNOSIS — Z923 Personal history of irradiation: Secondary | ICD-10-CM | POA: Insufficient documentation

## 2018-11-22 DIAGNOSIS — F1121 Opioid dependence, in remission: Secondary | ICD-10-CM | POA: Diagnosis not present

## 2018-11-22 DIAGNOSIS — Z9221 Personal history of antineoplastic chemotherapy: Secondary | ICD-10-CM

## 2018-11-22 DIAGNOSIS — J449 Chronic obstructive pulmonary disease, unspecified: Secondary | ICD-10-CM | POA: Diagnosis not present

## 2018-11-22 DIAGNOSIS — I2510 Atherosclerotic heart disease of native coronary artery without angina pectoris: Secondary | ICD-10-CM

## 2018-11-22 DIAGNOSIS — R918 Other nonspecific abnormal finding of lung field: Secondary | ICD-10-CM | POA: Diagnosis not present

## 2018-11-22 DIAGNOSIS — Z7189 Other specified counseling: Secondary | ICD-10-CM

## 2018-11-22 DIAGNOSIS — Z87891 Personal history of nicotine dependence: Secondary | ICD-10-CM

## 2018-11-22 LAB — CBC WITH DIFFERENTIAL/PLATELET
Abs Immature Granulocytes: 0.01 10*3/uL (ref 0.00–0.07)
Basophils Absolute: 0 10*3/uL (ref 0.0–0.1)
Basophils Relative: 1 %
Eosinophils Absolute: 0.2 10*3/uL (ref 0.0–0.5)
Eosinophils Relative: 3 %
HCT: 30.2 % — ABNORMAL LOW (ref 39.0–52.0)
Hemoglobin: 9.4 g/dL — ABNORMAL LOW (ref 13.0–17.0)
Immature Granulocytes: 0 %
Lymphocytes Relative: 16 %
Lymphs Abs: 0.9 10*3/uL (ref 0.7–4.0)
MCH: 32.9 pg (ref 26.0–34.0)
MCHC: 31.1 g/dL (ref 30.0–36.0)
MCV: 105.6 fL — ABNORMAL HIGH (ref 80.0–100.0)
Monocytes Absolute: 0.8 10*3/uL (ref 0.1–1.0)
Monocytes Relative: 13 %
Neutro Abs: 4.2 10*3/uL (ref 1.7–7.7)
Neutrophils Relative %: 67 %
Platelets: 171 10*3/uL (ref 150–400)
RBC: 2.86 MIL/uL — ABNORMAL LOW (ref 4.22–5.81)
RDW: 14.6 % (ref 11.5–15.5)
WBC: 6.1 10*3/uL (ref 4.0–10.5)
nRBC: 0 % (ref 0.0–0.2)

## 2018-11-22 LAB — COMPREHENSIVE METABOLIC PANEL
ALT: 11 U/L (ref 0–44)
AST: 16 U/L (ref 15–41)
Albumin: 2.8 g/dL — ABNORMAL LOW (ref 3.5–5.0)
Alkaline Phosphatase: 64 U/L (ref 38–126)
Anion gap: 7 (ref 5–15)
BUN: 12 mg/dL (ref 8–23)
CO2: 30 mmol/L (ref 22–32)
Calcium: 9 mg/dL (ref 8.9–10.3)
Chloride: 102 mmol/L (ref 98–111)
Creatinine, Ser: 0.89 mg/dL (ref 0.61–1.24)
GFR calc Af Amer: >60 mL/min (ref >60)
GFR calc non Af Amer: >60 mL/min (ref >60)
Glucose, Bld: 97 mg/dL (ref 70–99)
Potassium: 4.3 mmol/L (ref 3.5–5.1)
Sodium: 139 mmol/L (ref 135–145)
Total Bilirubin: 0.2 mg/dL — ABNORMAL LOW (ref 0.3–1.2)
Total Protein: 7.5 g/dL (ref 6.5–8.1)

## 2018-11-22 MED ORDER — SODIUM CHLORIDE 0.9 % IV SOLN
INTRAVENOUS | Status: AC
  Filled 2018-11-22: qty 250

## 2018-11-22 MED ORDER — SODIUM CHLORIDE 0.9 % IV SOLN
INTRAVENOUS | Status: DC
  Administered 2018-11-22: 14:00:00 via INTRAVENOUS
  Filled 2018-11-22 (×2): qty 250

## 2018-11-22 NOTE — Patient Instructions (Signed)

## 2018-11-26 ENCOUNTER — Ambulatory Visit: Payer: Self-pay

## 2018-11-26 ENCOUNTER — Telehealth: Payer: Self-pay | Admitting: Hematology

## 2018-11-26 ENCOUNTER — Inpatient Hospital Stay: Payer: BC Managed Care – PPO

## 2018-11-26 ENCOUNTER — Telehealth: Payer: Self-pay

## 2018-11-26 DIAGNOSIS — R1313 Dysphagia, pharyngeal phase: Secondary | ICD-10-CM | POA: Insufficient documentation

## 2018-11-26 NOTE — Telephone Encounter (Signed)
Scheduled appt per 7/2 los. Left a voice message of appt date and time.

## 2018-11-26 NOTE — Therapy (Signed)
[facility] [address] [place], [place], [address] Phone: [phone]   Fax:  [phone]  Patient Details  Name: Ronald Ruiz MRN: [id number] Date of Birth: 1955/07/09 Referring Provider:  Eppie Gibson, MD  Encounter Date: 11/26/2018  See telephone note by this provider for this date.   [facility] 11/26/2018, 11:53 AM  [facility] [address] [place], [place], [address] Phone: [phone]   Fax:  [phone]

## 2018-11-26 NOTE — Telephone Encounter (Signed)
Shortly prior to 1100, pt was called by the Webex specialist at this office to onboard pt for Webex ST visit as agreed upon when pt scheduled the ST appointment last month. Pt expressed concerns re: possibility of audio difficulty with Webex appt. Webex specialist at this office encouraged pt to attempt Webex, but pt told specialist he had to leave in approx 1 hour for appt at [facility]. SLP spoke to pt and told pt I would call him to touch base with him via telephone. When SLP called pt a ST Webex appointment was confirmed with pt for 1100 on 12-10-18. If audio does not work, pt and SLP agreed they will use Webex for video and SLP will call pt on telephone for the audio feed. Pt agreed and appointment time and date were re-confirmed with pt.   Garald Balding, MS, CCC-SLP

## 2018-11-27 ENCOUNTER — Inpatient Hospital Stay: Payer: BC Managed Care – PPO

## 2018-11-27 NOTE — Progress Notes (Signed)
Nutrition Follow-up:  Patient with cancer of larynx.  Patient has completed chemotherapy and radiation therapy.    Called and spoke with patient for nutrition follow-up.  Patient reports that he is mostly getting in 6 cartons of osmolite 1.5 daily sometimes 7.  Reports that he continues to take promod 49ml BID and water flush the same at 1160ml daily.    Reports that he is taking 4-5 oz of water daily via mouth.  SLP notes and MD notes reviewed.   Reports bowel movement about every 2-3 days.    Patient continues to receive IV fluids 2 times per week for the next 2 weeks.    Medications: reviewed  Labs: reviewed  Anthropometrics:   Weight is 138 lb 12.8 oz on 7/2, stable    Estimated Energy Needs  Kcals: 1980-2310 calories Protein: 100-112 g Fluid: 2.3 L  NUTRITION DIAGNOSIS: Unintentional weight loss improved   INTERVENTION:  Continue osmolite 1.5 at least 6 cartons per day with promod 68ml BID with water flush of at least 1181ml daily.   Patient has tube feeding formula and supplies.     MONITORING, EVALUATION, GOAL: Patient will tolerate tube feeding and promod to prevent further weight loss.   NEXT VISIT: phone f/u Tuesday, August 4  Ronald Ruiz B. Zenia Resides, [place], [place] Registered Dietitian [phone] (pager)

## 2018-11-30 ENCOUNTER — Inpatient Hospital Stay: Payer: BC Managed Care – PPO

## 2018-11-30 ENCOUNTER — Other Ambulatory Visit: Payer: Self-pay

## 2018-11-30 VITALS — BP 120/74 | HR 85 | Temp 98.9°F

## 2018-11-30 DIAGNOSIS — C320 Malignant neoplasm of glottis: Secondary | ICD-10-CM | POA: Diagnosis not present

## 2018-11-30 DIAGNOSIS — E860 Dehydration: Secondary | ICD-10-CM

## 2018-11-30 MED ORDER — SODIUM CHLORIDE 0.9 % IV SOLN
INTRAVENOUS | Status: AC
  Administered 2018-11-30 (×2): via INTRAVENOUS
  Filled 2018-11-30 (×3): qty 250

## 2018-12-03 ENCOUNTER — Inpatient Hospital Stay: Payer: BC Managed Care – PPO

## 2018-12-03 ENCOUNTER — Other Ambulatory Visit: Payer: Self-pay

## 2018-12-03 ENCOUNTER — Other Ambulatory Visit: Payer: Self-pay | Admitting: Hematology

## 2018-12-03 VITALS — BP 133/57 | HR 61 | Temp 99.1°F | Resp 18

## 2018-12-03 DIAGNOSIS — C320 Malignant neoplasm of glottis: Secondary | ICD-10-CM | POA: Diagnosis not present

## 2018-12-03 DIAGNOSIS — E860 Dehydration: Secondary | ICD-10-CM

## 2018-12-03 MED ORDER — SODIUM CHLORIDE 0.9 % IV SOLN
Freq: Once | INTRAVENOUS | Status: AC
  Administered 2018-12-03: 15:00:00 via INTRAVENOUS
  Filled 2018-12-03: qty 250

## 2018-12-03 NOTE — Patient Instructions (Signed)
Dehydration, Adult  Dehydration is when there is not enough fluid or water in your body. This happens when you lose more fluids than you take in. Dehydration can range from mild to very bad. It should be treated right away to keep it from getting very bad. Symptoms of mild dehydration may include:  Thirst.  Dry lips.  Slightly dry mouth.  Dry, warm skin.  Dizziness. Symptoms of moderate dehydration may include:  Very dry mouth.  Muscle cramps.  Dark pee (urine). Pee may be the color of tea.  Your body making less pee.  Your eyes making fewer tears.  Heartbeat that is uneven or faster than normal (palpitations).  Headache.  Light-headedness, especially when you stand up from sitting.  Fainting (syncope). Symptoms of very bad dehydration may include:  Changes in skin, such as: ? Cold and clammy skin. ? Blotchy (mottled) or pale skin. ? Skin that does not quickly return to normal after being lightly pinched and let go (poor skin turgor).  Changes in body fluids, such as: ? Feeling very thirsty. ? Your eyes making fewer tears. ? Not sweating when body temperature is high, such as in hot weather. ? Your body making very little pee.  Changes in vital signs, such as: ? Weak pulse. ? Pulse that is more than 100 beats a minute when you are sitting still. ? Fast breathing. ? Low blood pressure.  Other changes, such as: ? Sunken eyes. ? Cold hands and feet. ? Confusion. ? Lack of energy (lethargy). ? Trouble waking up from sleep. ? Short-term weight loss. ? Unconsciousness. Follow these instructions at home:   If told by your doctor, drink an ORS: ? Make an ORS by using instructions on the package. ? Start by drinking small amounts, about  cup (120 mL) every 5-10 minutes. ? Slowly drink more until you have had the amount that your doctor said to have.  Drink enough clear fluid to keep your pee clear or pale yellow. If you were told to drink an ORS, finish the  ORS first, then start slowly drinking clear fluids. Drink fluids such as: ? Water. Do not drink only water by itself. Doing that can make the salt (sodium) level in your body get too low (hyponatremia). ? Ice chips. ? Fruit juice that you have added water to (diluted). ? Low-calorie sports drinks.  Avoid: ? Alcohol. ? Drinks that have a lot of sugar. These include high-calorie sports drinks, fruit juice that does not have water added, and soda. ? Caffeine. ? Foods that are greasy or have a lot of fat or sugar.  Take over-the-counter and prescription medicines only as told by your doctor.  Do not take salt tablets. Doing that can make the salt level in your body get too high (hypernatremia).  Eat foods that have minerals (electrolytes). Examples include bananas, oranges, potatoes, tomatoes, and spinach.  Keep all follow-up visits as told by your doctor. This is important. Contact a doctor if:  You have belly (abdominal) pain that: ? Gets worse. ? Stays in one area (localizes).  You have a rash.  You have a stiff neck.  You get angry or annoyed more easily than normal (irritability).  You are more sleepy than normal.  You have a harder time waking up than normal.  You feel: ? Weak. ? Dizzy. ? Very thirsty.  You have peed (urinated) only a small amount of very dark pee during 6-8 hours. Get help right away if:  You have   symptoms of very bad dehydration.  You cannot drink fluids without throwing up (vomiting).  Your symptoms get worse with treatment.  You have a fever.  You have a very bad headache.  You are throwing up or having watery poop (diarrhea) and it: ? Gets worse. ? Does not go away.  You have blood or something green (bile) in your throw-up.  You have blood in your poop (stool). This may cause poop to look black and tarry.  You have not peed in 6-8 hours.  You pass out (faint).  Your heart rate when you are sitting still is more than 100 beats a  minute.  You have trouble breathing. This information is not intended to replace advice given to you by your health care provider. Make sure you discuss any questions you have with your health care provider. Document Released: 03/05/2009 Document Revised: 04/21/2017 Document Reviewed: 07/03/2015 Elsevier Patient Education  2020 Elsevier Inc.  

## 2018-12-07 ENCOUNTER — Inpatient Hospital Stay: Payer: BC Managed Care – PPO

## 2018-12-10 ENCOUNTER — Ambulatory Visit: Payer: BC Managed Care – PPO

## 2018-12-10 ENCOUNTER — Telehealth: Payer: Self-pay | Admitting: Hematology

## 2018-12-10 ENCOUNTER — Ambulatory Visit: Payer: Self-pay

## 2018-12-10 ENCOUNTER — Other Ambulatory Visit: Payer: Self-pay

## 2018-12-10 NOTE — Patient Instructions (Signed)
Do 10-15 reps of exercises where you have to swallow. Do 20 reps of pitch raise ("he"). Please complete ALL exercises as prescribed to allow yourself best opportunity for normal swallowing.

## 2018-12-10 NOTE — Progress Notes (Signed)
A user error has taken place: encounter opened in error, closed for administrative reasons.

## 2018-12-10 NOTE — Therapy (Signed)
[facility] [address] [place], [place], [address] Phone: [phone]   Fax:  [phone]  Speech Language Pathology Treatment  Patient Details  Name: Ronald Ruiz MRN: [id number] Date of Birth: 1956/04/24 Referring Provider (SLP): Eppie Gibson MD   Encounter Date: 12/10/2018  End of Session - 12/10/18 1210    Visit Number  3    Number of Visits  7    Date for SLP Re-Evaluation  03/04/19    SLP Start Time  25    SLP Stop Time   1138    SLP Time Calculation (min)  27 min    Activity Tolerance  Patient tolerated treatment well       Past Medical History:  Diagnosis Date  . Bronchitis 03/2018  . COPD (chronic obstructive pulmonary disease) ([facility])   . Herpes   . laryngeal ca dx'd 06/2018  . Lung nodule 2020  . Substance abuse ([facility])    opiate addiction, been on methadone for 11 years    Past Surgical History:  Procedure Laterality Date  . DIRECT LARYNGOSCOPY N/A 07/21/2018   Procedure: DIRECT LARYNGOSCOPY, TRACHEOSTOMY, BIOPSY;  Surgeon: Leta Baptist, MD;  Location: [facility];  Service: ENT;  Laterality: N/A;  . IR GASTROSTOMY TUBE MOD SED  08/02/2018    There were no vitals filed for this visit.  Subjective Assessment - 12/10/18 1116    Subjective  Pt PEG tube dependent, 6-7 cartons/day. Has water each day.              SLP Education - 12/10/18 1210    Education Details  Frequency and scope of HEP, foods to attempt when first returning to POs, sauces and condiments assist pharyngeal clearance    Person(s) Educated  Patient    Methods  Explanation    Comprehension  Verbalized understanding       SLP Short Term Goals - 12/10/18 1211      SLP SHORT TERM GOAL #1   Title  pt will describe knowledge of how to complete HEP accurately    Time  1    Period  --   visit (visit #2)   Status  Achieved      SLP SHORT TERM GOAL #2   Title  pt will tell SLP why pt is completing HEP, with modified  independence    Time  1    Period  --   visit (visit #2)   Status  On-going      SLP SHORT TERM GOAL #3   Title  pt will describe 3 overt s/s aspiration PNA with modified independence    Status  Achieved       SLP Long Term Goals - 12/10/18 1212      SLP LONG TERM GOAL #1   Title  pt will describe knowledge of how to complete HEP correctly over two visits    Time  1    Period  --   visits (visit #4)   Status  On-going      SLP LONG TERM GOAL #2   Title  pt will tell SLP how a food journal can facilitate return to a more normalized diet    Time  1    Period  --   visits (visit #4)   Status  On-going      SLP LONG TERM GOAL #3   Title  pt will describe how to modify HEP and the timeline associated with reduction in HEP frequency with  modified independence over two sessions    Time  4   4   Period  --   visits   Status  On-going       Plan - 12/10/18 1152    Clinical Impression Statement  This visit was attempted [facility] however pt was unable to log on to visit, so this visit took place via telephone. Unfortunately, because of this, SLP unable to adeuqately assess pt's safety with POs. Virtual visits instead of in-person face to face visits are due to medical director for head/neck cancer Dr. Eppie Gibson requesting all ancillary visits be done remotely for pt safety due to COVID-19 pandemic. At this time pt does not report any overt s/s aspiration PNA. 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 SLP encouraged pt to try 1/2 t of puree/mashed foods as pt has not tried any solid POs to date. SLP learned pt has had suboptimal completion of HEP. SLP encouraged pt to complete 10-15 reps of all exercises that require him to swallow, 20 reps of pitch raise. Pt will cont to need to be seen by SLP for assessing safety of PO intake and assessing correct completion of HEP.  Visits will be conducted over telephone/telehealth with possible transition back to in-person visits in the future at the discretion of medical director.    Speech Therapy Frequency  --   once every approx 4 weeks   Duration  --   7 total visits   Treatment/Interventions  Aspiration precaution training;Pharyngeal strengthening exercises;Diet toleration management by SLP;Compensatory techniques;SLP instruction and feedback;Patient/family education;Trials of upgraded texture/liquids    Potential to Achieve Goals  Good    SLP Home Exercise Plan  provided today    Consulted and Agree with Plan of Care  Patient       Patient will benefit from skilled therapeutic intervention in order to improve the following deficits and impairments:   1. Dysphagia, pharyngeal phase       Problem List Patient Active Problem List   Diagnosis Date Noted  . Dehydration 12/03/2018  . Counseling regarding advance care planning and goals of care 08/23/2018  . COPD with chronic bronchitis ([facility]) 07/31/2018  . Dysphagia 07/31/2018  . Laryngeal mass 07/31/2018  . Substance abuse ([facility]) 07/31/2018  . Protein-calorie malnutrition, severe 07/23/2018  . 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

## 2018-12-10 NOTE — Telephone Encounter (Signed)
[facility] PAL 7/30 appointments moved to 7/22. Left message. Patient to get updated schedule at visit today for fluids.

## 2018-12-12 ENCOUNTER — Inpatient Hospital Stay (HOSPITAL_BASED_OUTPATIENT_CLINIC_OR_DEPARTMENT_OTHER): Payer: BC Managed Care – PPO | Admitting: Hematology

## 2018-12-12 ENCOUNTER — Other Ambulatory Visit: Payer: Self-pay

## 2018-12-12 ENCOUNTER — Inpatient Hospital Stay: Payer: BC Managed Care – PPO

## 2018-12-12 ENCOUNTER — Telehealth: Payer: Self-pay | Admitting: Hematology

## 2018-12-12 VITALS — BP 115/69 | HR 85 | Temp 98.3°F | Resp 18 | Ht 72.0 in

## 2018-12-12 DIAGNOSIS — E860 Dehydration: Secondary | ICD-10-CM

## 2018-12-12 DIAGNOSIS — R918 Other nonspecific abnormal finding of lung field: Secondary | ICD-10-CM | POA: Diagnosis not present

## 2018-12-12 DIAGNOSIS — F1121 Opioid dependence, in remission: Secondary | ICD-10-CM

## 2018-12-12 DIAGNOSIS — J449 Chronic obstructive pulmonary disease, unspecified: Secondary | ICD-10-CM

## 2018-12-12 DIAGNOSIS — C320 Malignant neoplasm of glottis: Secondary | ICD-10-CM

## 2018-12-12 DIAGNOSIS — E440 Moderate protein-calorie malnutrition: Secondary | ICD-10-CM

## 2018-12-12 DIAGNOSIS — Z930 Tracheostomy status: Secondary | ICD-10-CM

## 2018-12-12 DIAGNOSIS — Z87891 Personal history of nicotine dependence: Secondary | ICD-10-CM

## 2018-12-12 DIAGNOSIS — I2510 Atherosclerotic heart disease of native coronary artery without angina pectoris: Secondary | ICD-10-CM

## 2018-12-12 DIAGNOSIS — Z801 Family history of malignant neoplasm of trachea, bronchus and lung: Secondary | ICD-10-CM

## 2018-12-12 DIAGNOSIS — Z923 Personal history of irradiation: Secondary | ICD-10-CM

## 2018-12-12 DIAGNOSIS — Z79899 Other long term (current) drug therapy: Secondary | ICD-10-CM

## 2018-12-12 DIAGNOSIS — I700 Atherosclerosis of aorta: Secondary | ICD-10-CM

## 2018-12-12 DIAGNOSIS — Z9221 Personal history of antineoplastic chemotherapy: Secondary | ICD-10-CM

## 2018-12-12 LAB — CBC WITH DIFFERENTIAL/PLATELET
Abs Immature Granulocytes: 0.02 10*3/uL (ref 0.00–0.07)
Basophils Absolute: 0 10*3/uL (ref 0.0–0.1)
Basophils Relative: 1 %
Eosinophils Absolute: 0.2 10*3/uL (ref 0.0–0.5)
Eosinophils Relative: 3 %
HCT: 33.7 % — ABNORMAL LOW (ref 39.0–52.0)
Hemoglobin: 10.4 g/dL — ABNORMAL LOW (ref 13.0–17.0)
Immature Granulocytes: 0 %
Lymphocytes Relative: 16 %
Lymphs Abs: 0.9 10*3/uL (ref 0.7–4.0)
MCH: 31.3 pg (ref 26.0–34.0)
MCHC: 30.9 g/dL (ref 30.0–36.0)
MCV: 101.5 fL — ABNORMAL HIGH (ref 80.0–100.0)
Monocytes Absolute: 0.7 10*3/uL (ref 0.1–1.0)
Monocytes Relative: 12 %
Neutro Abs: 3.8 10*3/uL (ref 1.7–7.7)
Neutrophils Relative %: 68 %
Platelets: 291 10*3/uL (ref 150–400)
RBC: 3.32 MIL/uL — ABNORMAL LOW (ref 4.22–5.81)
RDW: 13.6 % (ref 11.5–15.5)
WBC: 5.6 10*3/uL (ref 4.0–10.5)
nRBC: 0 % (ref 0.0–0.2)

## 2018-12-12 LAB — CMP (CANCER CENTER ONLY)
ALT: 11 U/L (ref 0–44)
AST: 15 U/L (ref 15–41)
Albumin: 2.7 g/dL — ABNORMAL LOW (ref 3.5–5.0)
Alkaline Phosphatase: 71 U/L (ref 38–126)
Anion gap: 11 (ref 5–15)
BUN: 17 mg/dL (ref 8–23)
CO2: 27 mmol/L (ref 22–32)
Calcium: 9.7 mg/dL (ref 8.9–10.3)
Chloride: 102 mmol/L (ref 98–111)
Creatinine: 0.83 mg/dL (ref 0.61–1.24)
GFR, Est AFR Am: >60 mL/min (ref >60)
GFR, Estimated: >60 mL/min (ref >60)
Glucose, Bld: 86 mg/dL (ref 70–99)
Potassium: 4.3 mmol/L (ref 3.5–5.1)
Sodium: 140 mmol/L (ref 135–145)
Total Bilirubin: 0.3 mg/dL (ref 0.3–1.2)
Total Protein: 8.3 g/dL — ABNORMAL HIGH (ref 6.5–8.1)

## 2018-12-12 LAB — MAGNESIUM: Magnesium: 2.2 mg/dL (ref 1.7–2.4)

## 2018-12-12 LAB — PHOSPHORUS: Phosphorus: 4.4 mg/dL (ref 2.5–4.6)

## 2018-12-12 NOTE — Progress Notes (Signed)
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 Bedrosian is a 63 year old male with a past medical history including substance abuse and currently on methadone, emphysema, and bronchitis.  The patient was undergoing outpatient evaluation for lung nodules and a vocal cord lesion and presented to the emergency room with worsening dyspnea.  ENT was consulted for his vocal cord mass.  Due to the location of the laryngeal mass, the patient was in danger of developing an upper airway obstruction.  A tracheostomy was placed to protect his airway.  A biopsy of the mass was performed.  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  The patient tells me that he was having persistent upper respiratory symptoms with dyspnea and cough since about November 2019.  He was on several rounds of antibiotics and steroids without significant improvement.  He reports a 89-month history of hoarseness in his voice.  He has lost about 40 pounds over the past 6 months.  He also reports having a sore throat.  Denies difficulty swallowing to admission.  Breathing is better since placement of tracheostomy.  He has not had any fevers or chills.  Denies chest pain.  Denies nausea, vomiting, constipation, diarrhea.  He is on NG tube feeding at this time.  Working with speech therapy on swallowing.  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  The patient is widowed and currently lives alone.  Wife died about 15 months ago secondary to lung cancer. Upon discharge, he plans to go live with 1 of his daughters in [place], [place].  He has 3 daughters who live locally.  1  of his daughters is an Therapist, sports who works on the oncology unit at [employer].   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 The patient's last visit with [place] was on 11/22/2018. The pt reports that he is doing well overall.  The pt reports that he saw his speech therapist yesterday and he has a plan to switch over to some more solid food.   Lab results today (12/12/18) of CBC w/diff and CMP is as follows: all values are WNL except for RBC at 3.32, hemoglobin at 10.4, HCT at 33.7, MCV at 101.5, total protein at 8.3, and albumin at 2.7. Magnesium at 2.2  On review of systems, pt reports cough with phlegm and denies fever and any other symptoms.    MEDICAL HISTORY:  Past Medical History:  Diagnosis Date  . Bronchitis 03/2018  . COPD (chronic obstructive pulmonary disease) ([facility])   . Herpes   . laryngeal ca dx'd 06/2018  . Lung nodule 2020  . Substance abuse ([facility])    opiate addiction, been on methadone for 11 years    SURGICAL HISTORY: Past Surgical History:  Procedure Laterality Date  . DIRECT LARYNGOSCOPY N/A 07/21/2018   Procedure: DIRECT LARYNGOSCOPY, TRACHEOSTOMY, BIOPSY;  Surgeon: Leta Baptist, MD;  Location: [facility];  Service: ENT;  Laterality: N/A;  . IR GASTROSTOMY TUBE MOD SED  08/02/2018    SOCIAL HISTORY: Social History   Socioeconomic History  . Marital status: Widowed    Spouse name:  Not on file  . Number of children: Not on file  . Years of education: Not on file  . Highest education level: Not on file  Occupational History  . Not on file  Social Needs  . Financial resource strain: Not on file  . Food insecurity    Worry: Not on file    Inability: Not on file  . Transportation needs    Medical: No    Non-medical: No  Tobacco Use  . Smoking status: Former Smoker    Packs/day: 2.00    Years: 50.00    Pack years: 100.00    Types: Cigarettes  . Smokeless tobacco:  Never Used  . Tobacco comment: 07/06/18 at 10 cigs per day, quit 06/16/18  Substance and Sexual Activity  . Alcohol use: Not Currently  . Drug use: Not Currently    Comment: hx of opiod abuse  . Sexual activity: Not Currently    Birth control/protection: None  Lifestyle  . Physical activity    Days per week: Not on file    Minutes per session: Not on file  . Stress: Not on file  Relationships  . Social Herbalist on phone: Not on file    Gets together: Not on file    Attends religious service: Not on file    Active member of club or organization: Not on file    Attends meetings of clubs or organizations: Not on file    Relationship status: Not on file  . Intimate partner violence    Fear of current or ex partner: No    Emotionally abused: No    Physically abused: No    Forced sexual activity: No  Other Topics Concern  . Not on file  Social History Narrative  . Not on file    FAMILY HISTORY: Family History  Problem Relation Age of Onset  . Alcohol abuse Father   . Cancer Father   . Cirrhosis Father   . Drug abuse Brother     ALLERGIES:  has No Known Allergies.  MEDICATIONS:  Current Outpatient Medications  Medication Sig Dispense Refill  . albuterol (PROVENTIL HFA;VENTOLIN HFA) 108 (90 Base) MCG/ACT inhaler Inhale 1-2 puffs into the lungs every 6 (six) hours as needed for wheezing.     . budesonide (PULMICORT) 0.5 MG/2ML nebulizer solution INHALE CONTENTS OF 1 VIAL VIA NEBULIZER 2 TIMES DAILY 360 mL 5  . ibuprofen (ADVIL,MOTRIN) 100 MG/5ML suspension Place 20-30 mLs (400-600 mg total) into feeding tube every 8 (eight) hours as needed for mild pain. 300 mL 0  . ipratropium-albuterol (DUONEB) 0.5-2.5 (3) MG/3ML SOLN TAKE 3 MLS BY NEBULIZATION EVERY 6 (SIX) HOURS AS NEEDED. 1080 mL 2  . methadone (DOLOPHINE) 10 MG/ML solution Take 110 mg by mouth daily.    . Nutritional Supplements (FEEDING SUPPLEMENT, OSMOLITE 1.5 CAL,) LIQD Give 410 ml 4 times daily via  feeding tube (7 cartons daily needed) 1659 mL 0  . Nutritional Supplements (PROMOD) LIQD Give 30 mL Promod twice daily via PEG. 2 Bottle 2  . ondansetron (ZOFRAN) 4 MG/5ML solution Place 10 mLs (8 mg total) into feeding tube 2 (two) times daily as needed for nausea or vomiting. (Patient not taking: Reported on 11/19/2018) 100 mL 1  . PERFOROMIST 20 MCG/2ML nebulizer solution INHALE CONTENTS OF 1 VIAL VIA NEBULIZER 2 TIMES DAILY 360 mL 2  . prochlorperazine (COMPAZINE) 10 MG tablet Take 1 tablet (10 mg total) by mouth every 6 (six) hours as needed (Nausea  or vomiting). (Patient not taking: Reported on 10/31/2018) 30 tablet 1  . prochlorperazine (COMPAZINE) 5 MG tablet Place 1-2 tablets (5-10 mg total) into feeding tube every 6 (six) hours as needed for nausea or vomiting. (Patient not taking: Reported on 11/19/2018) 30 tablet 0  . sucralfate (CARAFATE) 1 GM/10ML suspension TAKE 10 MLS (1 G TOTAL) BY MOUTH 2 (TWO) TIMES DAILY. (Patient not taking: Reported on 10/31/2018) 420 mL 0  . Water For Irrigation, Sterile (FREE WATER) SOLN Place 300 mLs into feeding tube every 8 (eight) hours. 5000 mL 0   No current facility-administered medications for this visit.     REVIEW OF SYSTEMS:   A 10+ POINT REVIEW OF SYSTEMS WAS OBTAINED including neurology, dermatology, psychiatry, cardiac, respiratory, lymph, extremities, GI, GU, Musculoskeletal, constitutional, breasts, reproductive, HEENT.  All pertinent positives are noted in the HPI.  All others are negative.     PHYSICAL EXAMINATION: ECOG PERFORMANCE STATUS: 1 - Symptomatic but completely ambulatory  Vitals:   12/12/18 1025  BP: 115/69  Pulse: 85  Resp: 18  Temp: 98.3 F (36.8 C)  SpO2: 100%   There were no vitals filed for this visit. .Body mass index is 18.82 kg/m.  GENERAL:alert, in no acute distress and comfortable SKIN: no acute rashes, no significant lesions EYES: conjunctiva are pink and non-injected, sclera anicteric OROPHARYNX: MMM, no  exudates, no oropharyngeal erythema or ulceration NECK: supple, no JVD. Tracheostomy in situ. LYMPH:  no palpable lymphadenopathy in the cervical, axillary or inguinal regions LUNGS: clear to auscultation b/l with normal respiratory effort HEART: regular rate & rhythm ABDOMEN:  normoactive bowel sounds , non tender, not distended. No palpable hepatosplenomegaly. PEG tube in situ. Extremity: no pedal edema PSYCH: alert & oriented x 3 with fluent speech NEURO: no focal motor/sensory deficits   LABORATORY DATA:  I have reviewed the data as listed  . CBC Latest Ref Rng & Units 12/12/2018 11/22/2018 11/02/2018  WBC 4.0 - 10.5 K/uL 5.6 6.1 7.3  Hemoglobin 13.0 - 17.0 g/dL 10.4(L) 9.4(L) 9.6(L)  Hematocrit 39.0 - 52.0 % 33.7(L) 30.2(L) 30.3(L)  Platelets 150 - 400 K/uL 291 171 290    . CMP Latest Ref Rng & Units 12/12/2018 11/22/2018 11/02/2018  Glucose 70 - 99 mg/dL 86 97 97  BUN 8 - 23 mg/dL 17 12 18   Creatinine 0.61 - 1.24 mg/dL 0.83 0.89 0.84  Sodium 135 - 145 mmol/L 140 139 139  Potassium 3.5 - 5.1 mmol/L 4.3 4.3 4.1  Chloride 98 - 111 mmol/L 102 102 99  CO2 22 - 32 mmol/L 27 30 30   Calcium 8.9 - 10.3 mg/dL 9.7 9.0 9.4  Total Protein 6.5 - 8.1 g/dL 8.3(H) 7.5 7.9  Total Bilirubin 0.3 - 1.2 mg/dL 0.3 0.2(L) 0.3  Alkaline Phos 38 - 126 U/L 71 64 65  AST 15 - 41 U/L 15 16 15   ALT 0 - 44 U/L 11 11 11      RADIOGRAPHIC STUDIES: I have personally reviewed the radiological images as listed and agreed with the findings in the report. No results found.   ASSESSMENT & PLAN:   63 y.o. male with  1. 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  Discussed with Dr. Benjamine Mola at bedside patient is not a candidate for larynx sparing surgery. He has lost about 40 pounds and is nutritionally quite depleted. Heavy smoking history of 1 to 2 packs/day with COPD /emphysema.  Not on home  oxygen. 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 Has tolerated his tracheostomy well and is been able to speak with Passy-Muir valve. Has had issues with swallowing and is still needing tube feeding.  07/17/18 PET/CT revealed he has multiple pulmonary nodules none of them are FDG avid. No FDG avid cervical lymphadenopathy. FDG avid laryngeal tumor noted.  10/16/18 CT Chest revealed "Since the prior CT, the patient has developed tree-in-bud type opacities, bronchial wall thickening, and bronchial mucous plugging, in the left lower lobe consistent with infection/inflammation. This is suspicious for MAI infection. The nodule previously measured in left lower lobe on the prior study has decreased in size as has and a smaller adjacent nodule. These findings all support a benign inflammatory etiology. 2. Nodules right middle lobe, with a dominant nodule measuring 1 cm, are all stable. 3. Stable changes of centrilobular emphysema. 4. Three-vessel coronary artery calcifications and aortic atherosclerosis, also stable. Aortic Atherosclerosis and Emphysema."  S/p 6 weekly doses of Carboplatin and Taxol with concurrent RT, completed 10/16/18. He received IMRT total dose of 70 Gray in 35 fractions.   PLAN: -Discussed pt labwork today, 12/12/18; all values are WNL except for RBC at 3.32, hemoglobin at 10.4, HCT at 33.7, MCV at 101.5, total protein at 8.3, and albumin at 2.7. -Discussed 12/12/18 Magnesium at 2.2 -Discussed holding IV fluids given good po intake F/u with Dr Benjamine Mola to plan weaning off trach -Discussed repeat PET scan in 3 weeks -Follow up in 1 month   FOLLOW UP: -PET/CT in 3 weeks ( as ordered with last visit-- not scheduled yet) RTC with Dr Irene Limbo in 6 weeks with labs ENT referral to Dr Benjamine Mola for consideration of trach removal.    All of the patients questions were answered with apparent satisfaction. The  patient knows to call the clinic with any problems, questions or concerns.  The total time spent in the appt was 20 minutes and more than 50% was on counseling and direct patient cares.     Sullivan Lone MD MS AAHIVMS [facility] Hematology/Oncology Physician [facility]  (Office):       [phone] (Work cell):  [phone] (Fax):           [phone]  12/12/2018 8:09 AM  I, Jacqualyn Posey, am acting as a Education administrator for Dr. Sullivan Lone.   .I have reviewed the above documentation for accuracy and completeness, and I agree with the above. [name] MD

## 2018-12-12 NOTE — Telephone Encounter (Signed)
Scheduled appt per 7/22 los. ° °Printed and mailed appt calendar. °

## 2018-12-17 ENCOUNTER — Encounter: Payer: Self-pay | Admitting: *Deleted

## 2018-12-17 NOTE — Progress Notes (Signed)
Oncology Nurse Navigator Documentation  Flowsheet/spreadsheet update.  Rick Shanna Strength, RN, BSN Head & Neck Oncology Nurse Navigator [facility] [place] [phone]   

## 2018-12-20 ENCOUNTER — Other Ambulatory Visit: Payer: BC Managed Care – PPO

## 2018-12-20 ENCOUNTER — Ambulatory Visit: Payer: BC Managed Care – PPO | Admitting: Hematology

## 2018-12-25 ENCOUNTER — Inpatient Hospital Stay: Payer: BC Managed Care – PPO | Attending: Hematology

## 2018-12-25 DIAGNOSIS — J449 Chronic obstructive pulmonary disease, unspecified: Secondary | ICD-10-CM | POA: Insufficient documentation

## 2018-12-25 DIAGNOSIS — Z87891 Personal history of nicotine dependence: Secondary | ICD-10-CM | POA: Insufficient documentation

## 2018-12-25 DIAGNOSIS — Z931 Gastrostomy status: Secondary | ICD-10-CM | POA: Insufficient documentation

## 2018-12-25 DIAGNOSIS — Z79899 Other long term (current) drug therapy: Secondary | ICD-10-CM | POA: Insufficient documentation

## 2018-12-25 DIAGNOSIS — C329 Malignant neoplasm of larynx, unspecified: Secondary | ICD-10-CM | POA: Insufficient documentation

## 2018-12-25 DIAGNOSIS — Z7951 Long term (current) use of inhaled steroids: Secondary | ICD-10-CM | POA: Insufficient documentation

## 2018-12-25 DIAGNOSIS — Z791 Long term (current) use of non-steroidal anti-inflammatories (NSAID): Secondary | ICD-10-CM | POA: Insufficient documentation

## 2018-12-25 DIAGNOSIS — I2510 Atherosclerotic heart disease of native coronary artery without angina pectoris: Secondary | ICD-10-CM | POA: Insufficient documentation

## 2018-12-25 DIAGNOSIS — D649 Anemia, unspecified: Secondary | ICD-10-CM | POA: Insufficient documentation

## 2018-12-25 NOTE — Progress Notes (Signed)
Nutrition Follow-up:  Patient with cancer of larynx.  Patient has completed chemotherapy and radiation therapy.    Called and spoke with patient for nutrition follow-up.  Patient reports that he is still mostly getting in 6 cartons of osmolite 1.5 daily sometimes 7 and promod 71ml BID.   Reports that he has been evaluated by SLP (notes reviewed) and he has tried small bites of mashed potatoes and applesauce.  Reports that mashed potatoes went well but applesauce did not, "too gritty".    Reports that he is having bowel movement about every 2-3 days but this is normal for him even before getting tube.     Medications: reviewed  Labs: reviewed  Anthropometrics:   Weight not taken on 7/22 when in clinic.  Patient reports that on home scale he is 130 lb. Last weight 138 lb on 7/2   Estimated Energy Needs  Kcals: 1980-2310 calories Protein: 100-112 g Fluid: 2.3 L  NUTRITION DIAGNOSIS: Unintentional weight loss decreased with reported weight   INTERVENTION:  Encouraged patient to continue SLP exercises and trying puree foods.  Examples provided today.  Patient reports that he has listing of foods from SLP to try.  Encouraged adding 7th carton of osmolite 1.5 with reported weight loss daily.  Discussed ways to get carton in. Patient has contact information.     MONITORING, EVALUATION, GOAL: Patient will tolerate tube feeding and oral feeding to prevent further weight loss.    NEXT VISIT: phone f/u on Tuesday, Sept 8th  Kamilla Hands B. Zenia Resides, [place], [place] Registered Dietitian [phone] (pager)

## 2018-12-31 ENCOUNTER — Telehealth: Payer: Self-pay | Admitting: Pulmonary Disease

## 2019-01-02 NOTE — Telephone Encounter (Signed)
Rec'd completed paperwork - fwd to [facility] via interoffice mail -pr  

## 2019-01-02 NOTE — Telephone Encounter (Signed)
Ronald Ruiz, please advise if paperwork has been taken care of by Dr. Vaughan Browner or if he is still currently working on it?

## 2019-01-02 NOTE — Telephone Encounter (Signed)
Hi. Yes he has it. It was given to him yesterday but he was really busy so he is aware to do it today.

## 2019-01-02 NOTE — Telephone Encounter (Signed)
Noted. Will leave encounter open until we know it has been finished and returned back to [place].

## 2019-01-07 ENCOUNTER — Other Ambulatory Visit: Payer: Self-pay

## 2019-01-07 ENCOUNTER — Ambulatory Visit: Payer: BC Managed Care – PPO

## 2019-01-07 DIAGNOSIS — R1313 Dysphagia, pharyngeal phase: Secondary | ICD-10-CM | POA: Insufficient documentation

## 2019-01-07 NOTE — Therapy (Signed)
[facility] [address] [place], [place], [address] Phone: [phone]   Fax:  [phone]  Speech Language Pathology Treatment  Patient Details  Name: Ronald Ruiz MRN: [id number] Date of Birth: 06/14/1955 Referring Provider (SLP): Eppie Gibson MD   Encounter Date: 01/07/2019  End of Session - 01/07/19 1522    Visit Number  4    Number of Visits  7    Date for SLP Re-Evaluation  03/04/19    SLP Start Time  1497    SLP Stop Time   1445    SLP Time Calculation (min)  40 min    Activity Tolerance  Patient tolerated treatment well       Past Medical History:  Diagnosis Date  . Bronchitis 03/2018  . COPD (chronic obstructive pulmonary disease) ([place])   . Herpes   . laryngeal ca dx'd 06/2018  . Lung nodule 2020  . Substance abuse ([place])    opiate addiction, been on methadone for 11 years    Past Surgical History:  Procedure Laterality Date  . DIRECT LARYNGOSCOPY N/A 07/21/2018   Procedure: DIRECT LARYNGOSCOPY, TRACHEOSTOMY, BIOPSY;  Surgeon: Leta Baptist, MD;  Location: [facility];  Service: ENT;  Laterality: N/A;  . IR GASTROSTOMY TUBE MOD SED  08/02/2018    There were no vitals filed for this visit.  Subjective Assessment - 01/07/19 1440    Subjective  Pt cont PEG tube dependent. Continues water each day, with puree x1/day (approx one cup).    Currently in Pain?  No/denies            ADULT SLP TREATMENT - 01/07/19 1415      General Information   Behavior/Cognition  Alert;Cooperative;Pleasant mood      Treatment Provided   Treatment provided  Dysphagia      Dysphagia Treatment   Temperature Spikes Noted  No    Respiratory Status  Trach   with PMSV   Treatment Methods  Skilled observation;Therapeutic exercise;Patient/caregiver education    Patient observed directly with PO's  No   refused due to pt does not have any pureed in the house   Other treatment/comments  Pt reports to suboptimal completion  of HEP - SLP encourgaed pt to do complete HEP BID.  "I don't seem to have any issues so I think I'm ok (to not do the HEP)" SLP reminded pt of muscle fibrosis danger. SLP provided pt with max A consistently for procedure for HEP. Pt could not locate his copy of HEP but stated it was there "somewhere". SLP offered to send one to pt via email.        SLP Education - 01/07/19 1522    Education Details  HEP procedure, danger of muscle fibrosis when don't do HEP    Person(s) Educated  Patient    Methods  Explanation;Demonstration;Verbal cues    Comprehension  Verbal cues required;Returned demonstration;Verbalized understanding;Need further instruction       SLP Short Term Goals - 01/07/19 1527      SLP SHORT TERM GOAL #1   Title  pt will describe knowledge of how to complete HEP accurately    Status  Not Met      SLP SHORT TERM GOAL #2   Title  pt will tell SLP why pt is completing HEP, with modified independence    Status  Partially Met      SLP SHORT TERM GOAL #3   Title  pt will describe 3 overt s/s aspiration  PNA with modified independence    Status  Achieved       SLP Long Term Goals - 01/07/19 1528      SLP LONG TERM GOAL #1   Title  pt will describe knowledge of how to complete HEP correctly over two visits    Time  1    Period  --   visits (visit #6)   Status  On-going      SLP LONG TERM GOAL #2   Title  pt will tell SLP how a food journal can facilitate return to a more normalized diet    Time  1    Period  --   visits (visit #6)   Status  On-going      SLP LONG TERM GOAL #3   Title  pt will describe how to modify HEP and the timeline associated with reduction in HEP frequency with modified independence over two sessions    Time  3   4   Period  --   visits   Status  On-going       Plan - 01/07/19 1523    Clinical Impression Statement  This visit was copmleted via Webex due to medical director for head/neck cancer Dr. Eppie Gibson requesting all ancillary  visits be done remotely for pt safety due to COVID-19 pandemic. At this time pt does not report any overt s/s aspiration PNA. By his report, he is functionally swallowing small sips of water and approx one cup of puree/day. Pt refused POs with SLP due to not having any in the house. Pt cont to have suboptimal completion of HEP. SLP provided max cues for HEP procedure for all exercises (Mendelsohn, MAsako, effortful, vocal adduction, and chin tuck against resisitance). Pt will cont to need to be seen by SLP for assessing safety of PO intake and assessing correct completion of HEP. If pt still shows minimal completion of HEP he may be d/c'd after instruction on overt s/s aspiration PNA and overt s/s aspiration during next session. Visits will be conducted over telephone/telehealth with possible transition back to in-person visits in the future at the discretion of medical director.    Speech Therapy Frequency  --   once every approx 4 weeks   Duration  --   7 total visits   Treatment/Interventions  Aspiration precaution training;Pharyngeal strengthening exercises;Diet toleration management by SLP;Compensatory techniques;SLP instruction and feedback;Patient/family education;Trials of upgraded texture/liquids    Potential to Achieve Goals  Good    SLP Home Exercise Plan  provided today    Consulted and Agree with Plan of Care  Patient       Patient will benefit from skilled therapeutic intervention in order to improve the following deficits and impairments:   1. Dysphagia, pharyngeal phase       Problem List Patient Active Problem List   Diagnosis Date Noted  . Dehydration 12/03/2018  . Counseling regarding advance care planning and goals of care 08/23/2018  . COPD with chronic bronchitis ([place]) 07/31/2018  . Dysphagia 07/31/2018  . Laryngeal mass 07/31/2018  . Substance abuse ([place]) 07/31/2018  . Protein-calorie malnutrition, severe 07/23/2018  . Malignant neoplasm of glottis ([place]) 07/21/2018     [facility] ,[place], [place]  01/07/2019, 3:28 PM  [facility] [address] [place], [place], [address] Phone: [phone]   Fax:  [phone]   Name: Ronald Ruiz MRN: [id number] Date of Birth: 09-Dec-1955

## 2019-01-15 NOTE — Progress Notes (Signed)
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  The patient was undergoing outpatient evaluation for lung nodules and a vocal cord lesion and presented to the emergency room with worsening dyspnea.  ENT was consulted for his vocal cord mass.  Due to the location of the laryngeal mass, the patient was in danger of developing an upper airway obstruction.  A tracheostomy was placed to protect his airway.  A biopsy of the mass was performed.  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  The patient tells me that he was having persistent upper respiratory symptoms with dyspnea and cough since about November 2019.  He was on several rounds of antibiotics and steroids without significant improvement.  He reports a 42-month history of hoarseness in his voice.  He has lost about 40 pounds over the past 6 months.  He also reports having a sore throat.  Denies difficulty swallowing to admission.  Breathing is better since placement of tracheostomy.  He has not had any fevers or chills.  Denies chest pain.  Denies nausea, vomiting, constipation, diarrhea.  He is on NG tube feeding at this time.  Working with speech therapy on swallowing.  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  The patient is widowed and currently lives alone.  Wife died  about 15 months ago secondary to lung cancer. Upon discharge, he plans to go live with 1 of his daughters in [place], [place].  He has 3 daughters who live locally.  1 of his daughters is an Therapist, sports who works on the oncology unit at [employer].   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 The patient's last visit with [place] was on 12/12/2018. The pt reports that he is doing well overall.  The pt reports that he has not had much energy lately and is struggling to gain weight. All of his food intake is done via his PEG tube. Denies pain around PEG tube. He notes that he wakes up with a lot of phlegm, but it clears throughout the day.  We had planned for the pt to get a PET scan before this visit, but he notes nobody called him to schedule it.  Lab results today (01/17/2019) of CBC w/diff and CMP is as follows: all values are WNL except for RBC at 3.44, HGB at 10.4, HCT at 33.2, monocytes abs at 1.2k, Albumin at 2.6 01/17/2019 Magnesium at 2.1 01/17/2019 Phosphorus at 4.4  On review of systems, pt reports low energy, phlegm in the morning, and denies pain around PEG tube and any other symptoms.   MEDICAL HISTORY:  Past Medical History:  Diagnosis Date  . Bronchitis 03/2018  . COPD (chronic obstructive pulmonary disease) ([place])   . Herpes   . laryngeal ca dx'd 06/2018  . Lung nodule 2020  . Substance abuse ([place])  opiate addiction, been on methadone for 11 years    SURGICAL HISTORY: Past Surgical History:  Procedure Laterality Date  . DIRECT LARYNGOSCOPY N/A 07/21/2018   Procedure: DIRECT LARYNGOSCOPY, TRACHEOSTOMY, BIOPSY;  Surgeon: Leta Baptist, MD;  Location: [facility];  Service: ENT;  Laterality: N/A;  . IR GASTROSTOMY TUBE MOD SED  08/02/2018    SOCIAL HISTORY: Social History   Socioeconomic History  . Marital status: Widowed    Spouse name: Not on file  . Number of children: Not  on file  . Years of education: Not on file  . Highest education level: Not on file  Occupational History  . Not on file  Social Needs  . Financial resource strain: Not on file  . Food insecurity    Worry: Not on file    Inability: Not on file  . Transportation needs    Medical: No    Non-medical: No  Tobacco Use  . Smoking status: Former Smoker    Packs/day: 2.00    Years: 50.00    Pack years: 100.00    Types: Cigarettes  . Smokeless tobacco: Never Used  . Tobacco comment: 07/06/18 at 10 cigs per day, quit 06/16/18  Substance and Sexual Activity  . Alcohol use: Not Currently  . Drug use: Not Currently    Comment: hx of opiod abuse  . Sexual activity: Not Currently    Birth control/protection: None  Lifestyle  . Physical activity    Days per week: Not on file    Minutes per session: Not on file  . Stress: Not on file  Relationships  . Social Herbalist on phone: Not on file    Gets together: Not on file    Attends religious service: Not on file    Active member of club or organization: Not on file    Attends meetings of clubs or organizations: Not on file    Relationship status: Not on file  . Intimate partner violence    Fear of current or ex partner: No    Emotionally abused: No    Physically abused: No    Forced sexual activity: No  Other Topics Concern  . Not on file  Social History Narrative  . Not on file    FAMILY HISTORY: Family History  Problem Relation Age of Onset  . Alcohol abuse Father   . Cancer Father   . Cirrhosis Father   . Drug abuse Brother     ALLERGIES:  has No Known Allergies.  MEDICATIONS:  Current Outpatient Medications  Medication Sig Dispense Refill  . albuterol (PROVENTIL HFA;VENTOLIN HFA) 108 (90 Base) MCG/ACT inhaler Inhale 1-2 puffs into the lungs every 6 (six) hours as needed for wheezing.     . budesonide (PULMICORT) 0.5 MG/2ML nebulizer solution INHALE CONTENTS OF 1 VIAL VIA NEBULIZER 2 TIMES DAILY 360 mL 5  .  ibuprofen (ADVIL,MOTRIN) 100 MG/5ML suspension Place 20-30 mLs (400-600 mg total) into feeding tube every 8 (eight) hours as needed for mild pain. 300 mL 0  . ipratropium-albuterol (DUONEB) 0.5-2.5 (3) MG/3ML SOLN TAKE 3 MLS BY NEBULIZATION EVERY 6 (SIX) HOURS AS NEEDED. 1080 mL 2  . methadone (DOLOPHINE) 10 MG/ML solution Take 110 mg by mouth daily.    . Nutritional Supplements (FEEDING SUPPLEMENT, OSMOLITE 1.5 CAL,) LIQD Give 410 ml 4 times daily via feeding tube (7 cartons daily needed) 1659 mL 0  . Nutritional Supplements (PROMOD) LIQD Give 30 mL Promod twice daily via PEG. 2 Bottle  2  . ondansetron (ZOFRAN) 4 MG/5ML solution Place 10 mLs (8 mg total) into feeding tube 2 (two) times daily as needed for nausea or vomiting. (Patient not taking: Reported on 11/19/2018) 100 mL 1  . PERFOROMIST 20 MCG/2ML nebulizer solution INHALE CONTENTS OF 1 VIAL VIA NEBULIZER 2 TIMES DAILY 360 mL 2  . prochlorperazine (COMPAZINE) 10 MG tablet Take 1 tablet (10 mg total) by mouth every 6 (six) hours as needed (Nausea or vomiting). (Patient not taking: Reported on 10/31/2018) 30 tablet 1  . prochlorperazine (COMPAZINE) 5 MG tablet Place 1-2 tablets (5-10 mg total) into feeding tube every 6 (six) hours as needed for nausea or vomiting. (Patient not taking: Reported on 11/19/2018) 30 tablet 0  . sucralfate (CARAFATE) 1 GM/10ML suspension TAKE 10 MLS (1 G TOTAL) BY MOUTH 2 (TWO) TIMES DAILY. (Patient not taking: Reported on 10/31/2018) 420 mL 0  . Water For Irrigation, Sterile (FREE WATER) SOLN Place 300 mLs into feeding tube every 8 (eight) hours. 5000 mL 0   No current facility-administered medications for this visit.     REVIEW OF SYSTEMS:    A 10+ POINT REVIEW OF SYSTEMS WAS OBTAINED including neurology, dermatology, psychiatry, cardiac, respiratory, lymph, extremities, GI, GU, Musculoskeletal, constitutional, breasts, reproductive, HEENT.  All pertinent positives are noted in the HPI.  All others are negative.    PHYSICAL EXAMINATION: ECOG PERFORMANCE STATUS: 1 - Symptomatic but completely ambulatory  Vitals:   01/17/19 1139  BP: 99/66  Pulse: 68  Resp: 18  Temp: 98.7 F (37.1 C)  SpO2: 98%   Filed Weights   01/17/19 1139  Weight: 135 lb 1.6 oz (61.3 kg)   .Body mass index is 18.32 kg/m.  GENERAL:alert, in no acute distress and comfortable SKIN: no acute rashes, no significant lesions EYES: conjunctiva are pink and non-injected, sclera anicteric OROPHARYNX: MMM, no exudates, no oropharyngeal erythema or ulceration NECK: supple, no JVD. Tracheostomy in situ LYMPH:  no palpable lymphadenopathy in the cervical, axillary or inguinal regions LUNGS: clear to auscultation b/l with normal respiratory effort HEART: regular rate & rhythm ABDOMEN:  normoactive bowel sounds , non tender, not distended. No palpable hepatosplenomegaly. PEG tube in situ Extremity: no pedal edema PSYCH: alert & oriented x 3 with fluent speech NEURO: no focal motor/sensory deficits   LABORATORY DATA:  I have reviewed the data as listed  . CBC Latest Ref Rng & Units 01/17/2019 12/12/2018 11/22/2018  WBC 4.0 - 10.5 K/uL 8.6 5.6 6.1  Hemoglobin 13.0 - 17.0 g/dL 10.4(L) 10.4(L) 9.4(L)  Hematocrit 39.0 - 52.0 % 33.2(L) 33.7(L) 30.2(L)  Platelets 150 - 400 K/uL 234 291 171    . CMP Latest Ref Rng & Units 01/17/2019 12/12/2018 11/22/2018  Glucose 70 - 99 mg/dL 90 86 97  BUN 8 - 23 mg/dL 17 17 12   Creatinine 0.61 - 1.24 mg/dL 0.82 0.83 0.89  Sodium 135 - 145 mmol/L 138 140 139  Potassium 3.5 - 5.1 mmol/L 4.7 4.3 4.3  Chloride 98 - 111 mmol/L 99 102 102  CO2 22 - 32 mmol/L 30 27 30   Calcium 8.9 - 10.3 mg/dL 9.4 9.7 9.0  Total Protein 6.5 - 8.1 g/dL 8.0 8.3(H) 7.5  Total Bilirubin 0.3 - 1.2 mg/dL 0.3 0.3 0.2(L)  Alkaline Phos 38 - 126 U/L 72 71 64  AST 15 - 41 U/L 18 15 16   ALT 0 - 44 U/L 14 11 11      RADIOGRAPHIC STUDIES: I have personally reviewed the radiological images as listed and  agreed with the findings in  the report. No results found.   ASSESSMENT & PLAN:   63 y.o. male with  1. 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  Discussed with Dr. Benjamine Mola at bedside patient is not a candidate for larynx sparing surgery. He has lost about 40 pounds and is nutritionally quite depleted. Heavy smoking history of 1 to 2 packs/day with COPD /emphysema.  Not on home oxygen. 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 Has tolerated his tracheostomy well and is been able to speak with Passy-Muir valve. Has had issues with swallowing and is still needing tube feeding.  07/17/18 PET/CT revealed he has multiple pulmonary nodules none of them are FDG avid. No FDG avid cervical lymphadenopathy. FDG avid laryngeal tumor noted.  10/16/18 CT Chest revealed "Since the prior CT, the patient has developed tree-in-bud type opacities, bronchial wall thickening, and bronchial mucous plugging, in the left lower lobe consistent with infection/inflammation. This is suspicious for MAI infection. The nodule previously measured in left lower lobe on the prior study has decreased in size as has and a smaller adjacent nodule. These findings all support a benign inflammatory etiology. 2. Nodules right middle lobe, with a dominant nodule measuring 1 cm, are all stable. 3. Stable changes of centrilobular emphysema. 4. Three-vessel coronary artery calcifications and aortic atherosclerosis, also stable. Aortic Atherosclerosis and Emphysema."  S/p 6 weekly doses of Carboplatin and Taxol with concurrent RT, completed 10/16/18. He received IMRT total dose of 70 Gray in 35 fractions.   PLAN: -Discussed pt labwork today, 01/17/2019; mild anemia, electrolytes are stable -F/u with Dr Benjamine Mola to plan for trach removal and transitioning from PEG tube feeding  to oral feeding -Discussed that it would be ideal to remove trach before swallowing studies are done. -PET scan in 1 week -Will see the pt back in 4 weeks by phone   PET/CT in 1 week ENT referral to Dr Benjamine Mola for consideration of trach removal in 1-2 weeks Dr Irene Limbo -phone visit in 4 weeks after PET and ENT visit   All of the patients questions were answered with apparent satisfaction. The patient knows to call the clinic with any problems, questions or concerns.  The total time spent in the appt was 20 minutes and more than 50% was on counseling and direct patient cares.   Sullivan Lone MD MS AAHIVMS [facility] Hematology/Oncology Physician [facility]  (Office):       [phone] (Work cell):  [phone] (Fax):           [phone]  01/15/2019 7:53 PM  I, De Burrs, am acting as a scribe for Dr. Irene Limbo  .I have reviewed the above documentation for accuracy and completeness, and I agree with the above. [name] MD

## 2019-01-17 ENCOUNTER — Inpatient Hospital Stay (HOSPITAL_BASED_OUTPATIENT_CLINIC_OR_DEPARTMENT_OTHER): Payer: BC Managed Care – PPO | Admitting: Hematology

## 2019-01-17 ENCOUNTER — Telehealth: Payer: Self-pay | Admitting: *Deleted

## 2019-01-17 ENCOUNTER — Other Ambulatory Visit: Payer: Self-pay

## 2019-01-17 ENCOUNTER — Inpatient Hospital Stay: Payer: BC Managed Care – PPO

## 2019-01-17 VITALS — BP 99/66 | HR 68 | Temp 98.7°F | Resp 18 | Ht 72.0 in | Wt 135.1 lb

## 2019-01-17 DIAGNOSIS — C320 Malignant neoplasm of glottis: Secondary | ICD-10-CM | POA: Diagnosis not present

## 2019-01-17 DIAGNOSIS — Z87891 Personal history of nicotine dependence: Secondary | ICD-10-CM | POA: Diagnosis not present

## 2019-01-17 DIAGNOSIS — E860 Dehydration: Secondary | ICD-10-CM

## 2019-01-17 DIAGNOSIS — Z79899 Other long term (current) drug therapy: Secondary | ICD-10-CM | POA: Diagnosis not present

## 2019-01-17 DIAGNOSIS — C329 Malignant neoplasm of larynx, unspecified: Secondary | ICD-10-CM | POA: Diagnosis not present

## 2019-01-17 DIAGNOSIS — I2510 Atherosclerotic heart disease of native coronary artery without angina pectoris: Secondary | ICD-10-CM | POA: Diagnosis not present

## 2019-01-17 DIAGNOSIS — E440 Moderate protein-calorie malnutrition: Secondary | ICD-10-CM

## 2019-01-17 DIAGNOSIS — Z931 Gastrostomy status: Secondary | ICD-10-CM | POA: Diagnosis not present

## 2019-01-17 DIAGNOSIS — Z791 Long term (current) use of non-steroidal anti-inflammatories (NSAID): Secondary | ICD-10-CM | POA: Diagnosis not present

## 2019-01-17 DIAGNOSIS — D649 Anemia, unspecified: Secondary | ICD-10-CM | POA: Diagnosis not present

## 2019-01-17 DIAGNOSIS — J449 Chronic obstructive pulmonary disease, unspecified: Secondary | ICD-10-CM | POA: Diagnosis not present

## 2019-01-17 DIAGNOSIS — Z7951 Long term (current) use of inhaled steroids: Secondary | ICD-10-CM | POA: Diagnosis not present

## 2019-01-17 LAB — CMP (CANCER CENTER ONLY)
ALT: 14 U/L (ref 0–44)
AST: 18 U/L (ref 15–41)
Albumin: 2.6 g/dL — ABNORMAL LOW (ref 3.5–5.0)
Alkaline Phosphatase: 72 U/L (ref 38–126)
Anion gap: 9 (ref 5–15)
BUN: 17 mg/dL (ref 8–23)
CO2: 30 mmol/L (ref 22–32)
Calcium: 9.4 mg/dL (ref 8.9–10.3)
Chloride: 99 mmol/L (ref 98–111)
Creatinine: 0.82 mg/dL (ref 0.61–1.24)
GFR, Est AFR Am: >60 mL/min (ref >60)
GFR, Estimated: >60 mL/min (ref >60)
Glucose, Bld: 90 mg/dL (ref 70–99)
Potassium: 4.7 mmol/L (ref 3.5–5.1)
Sodium: 138 mmol/L (ref 135–145)
Total Bilirubin: 0.3 mg/dL (ref 0.3–1.2)
Total Protein: 8 g/dL (ref 6.5–8.1)

## 2019-01-17 LAB — PHOSPHORUS: Phosphorus: 4.4 mg/dL (ref 2.5–4.6)

## 2019-01-17 LAB — CBC WITH DIFFERENTIAL/PLATELET
Abs Immature Granulocytes: 0.04 10*3/uL (ref 0.00–0.07)
Basophils Absolute: 0 10*3/uL (ref 0.0–0.1)
Basophils Relative: 0 %
Eosinophils Absolute: 0.1 10*3/uL (ref 0.0–0.5)
Eosinophils Relative: 1 %
HCT: 33.2 % — ABNORMAL LOW (ref 39.0–52.0)
Hemoglobin: 10.4 g/dL — ABNORMAL LOW (ref 13.0–17.0)
Immature Granulocytes: 1 %
Lymphocytes Relative: 9 %
Lymphs Abs: 0.7 10*3/uL (ref 0.7–4.0)
MCH: 30.2 pg (ref 26.0–34.0)
MCHC: 31.3 g/dL (ref 30.0–36.0)
MCV: 96.5 fL (ref 80.0–100.0)
Monocytes Absolute: 1.2 10*3/uL — ABNORMAL HIGH (ref 0.1–1.0)
Monocytes Relative: 14 %
Neutro Abs: 6.5 10*3/uL (ref 1.7–7.7)
Neutrophils Relative %: 75 %
Platelets: 234 10*3/uL (ref 150–400)
RBC: 3.44 MIL/uL — ABNORMAL LOW (ref 4.22–5.81)
RDW: 13.9 % (ref 11.5–15.5)
WBC: 8.6 10*3/uL (ref 4.0–10.5)
nRBC: 0 % (ref 0.0–0.2)

## 2019-01-17 LAB — MAGNESIUM: Magnesium: 2.1 mg/dL (ref 1.7–2.4)

## 2019-01-17 NOTE — Telephone Encounter (Signed)
Called patient to inform of lab on 01-22-19 @ 11 am @ [facility] and his Pet Scan on 01-22-19 - arrival time- 11:30 am @ [facility], pt. to be NPO- 6 hrs. prior to test, patient to receive results from Dr. Isidore Moos on 01-23-19, spoke with patient and he is aware of these appts.

## 2019-01-17 NOTE — Progress Notes (Signed)
error 

## 2019-01-18 ENCOUNTER — Telehealth: Payer: Self-pay | Admitting: Hematology

## 2019-01-18 NOTE — Telephone Encounter (Signed)
Scheduled appt per 8/27 los.  Left a voice message of appt date and time.

## 2019-01-22 ENCOUNTER — Telehealth: Payer: Self-pay | Admitting: *Deleted

## 2019-01-22 ENCOUNTER — Other Ambulatory Visit: Payer: Self-pay

## 2019-01-22 ENCOUNTER — Ambulatory Visit
Admission: RE | Admit: 2019-01-22 | Discharge: 2019-01-22 | Disposition: A | Discharge status: Home / Self Care | Payer: BC Managed Care – PPO | Source: Ambulatory Visit | Attending: Radiation Oncology | Admitting: Radiation Oncology

## 2019-01-22 ENCOUNTER — Encounter (HOSPITAL_COMMUNITY)
Admission: RE | Admit: 2019-01-22 | Discharge: 2019-01-22 | Disposition: A | Discharge status: Home / Self Care | Payer: BC Managed Care – PPO | Source: Ambulatory Visit | Attending: Hematology | Admitting: Hematology

## 2019-01-22 DIAGNOSIS — E440 Moderate protein-calorie malnutrition: Secondary | ICD-10-CM

## 2019-01-22 DIAGNOSIS — C320 Malignant neoplasm of glottis: Secondary | ICD-10-CM | POA: Insufficient documentation

## 2019-01-22 DIAGNOSIS — R634 Abnormal weight loss: Secondary | ICD-10-CM | POA: Diagnosis not present

## 2019-01-22 DIAGNOSIS — Z1329 Encounter for screening for other suspected endocrine disorder: Secondary | ICD-10-CM

## 2019-01-22 LAB — CMP (CANCER CENTER ONLY)
ALT: 12 U/L (ref 0–44)
AST: 16 U/L (ref 15–41)
Albumin: 2.4 g/dL — ABNORMAL LOW (ref 3.5–5.0)
Alkaline Phosphatase: 68 U/L (ref 38–126)
Anion gap: 7 (ref 5–15)
BUN: 18 mg/dL (ref 8–23)
CO2: 31 mmol/L (ref 22–32)
Calcium: 9.2 mg/dL (ref 8.9–10.3)
Chloride: 100 mmol/L (ref 98–111)
Creatinine: 0.78 mg/dL (ref 0.61–1.24)
GFR, Est AFR Am: >60 mL/min (ref >60)
GFR, Estimated: >60 mL/min (ref >60)
Glucose, Bld: 97 mg/dL (ref 70–99)
Potassium: 4.5 mmol/L (ref 3.5–5.1)
Sodium: 138 mmol/L (ref 135–145)
Total Bilirubin: 0.2 mg/dL — ABNORMAL LOW (ref 0.3–1.2)
Total Protein: 7.6 g/dL (ref 6.5–8.1)

## 2019-01-22 LAB — CBC WITH DIFFERENTIAL/PLATELET
Abs Immature Granulocytes: 0.04 10*3/uL (ref 0.00–0.07)
Basophils Absolute: 0 10*3/uL (ref 0.0–0.1)
Basophils Relative: 0 %
Eosinophils Absolute: 0.1 10*3/uL (ref 0.0–0.5)
Eosinophils Relative: 1 %
HCT: 30.7 % — ABNORMAL LOW (ref 39.0–52.0)
Hemoglobin: 9.6 g/dL — ABNORMAL LOW (ref 13.0–17.0)
Immature Granulocytes: 1 %
Lymphocytes Relative: 8 %
Lymphs Abs: 0.6 10*3/uL — ABNORMAL LOW (ref 0.7–4.0)
MCH: 30.2 pg (ref 26.0–34.0)
MCHC: 31.3 g/dL (ref 30.0–36.0)
MCV: 96.5 fL (ref 80.0–100.0)
Monocytes Absolute: 0.9 10*3/uL (ref 0.1–1.0)
Monocytes Relative: 12 %
Neutro Abs: 6.1 10*3/uL (ref 1.7–7.7)
Neutrophils Relative %: 78 %
Platelets: 282 10*3/uL (ref 150–400)
RBC: 3.18 MIL/uL — ABNORMAL LOW (ref 4.22–5.81)
RDW: 14 % (ref 11.5–15.5)
WBC: 7.8 10*3/uL (ref 4.0–10.5)
nRBC: 0 % (ref 0.0–0.2)

## 2019-01-22 LAB — GLUCOSE, CAPILLARY: Glucose-Capillary: 96 mg/dL (ref 70–99)

## 2019-01-22 LAB — TSH: TSH: 0.733 u[IU]/mL (ref 0.320–4.118)

## 2019-01-22 LAB — MAGNESIUM: Magnesium: 2.1 mg/dL (ref 1.7–2.4)

## 2019-01-22 MED ORDER — FLUDEOXYGLUCOSE F - 18 (FDG) INJECTION
6.7000 | Freq: Once | INTRAVENOUS | Status: AC | PRN
  Administered 2019-01-22: 6.7 via INTRAVENOUS

## 2019-01-22 NOTE — Telephone Encounter (Signed)
Contacted by Verdis Frederickson in [facility] lab - patient has labs ordered to be drawn today and labs ordered to be drawn tomorrow (ordered by Dr. Irene Limbo and Dr. Lanell Persons, both [facility] MDs). She asked for [facility] to draw labs for both providers today - ok given.

## 2019-01-22 NOTE — Progress Notes (Signed)
  Radiation Oncology         [phone] ________________________________  Name: Ronald Ruiz MRN: [id number]  Date: 01/23/2019  DOB: 1955/11/26  Follow-Up Visit Note - NO SHOW  Patient was reviewed this morning at tumor board.  PET reviewed. He was felt to have a complete response in the neck but consolidative changes in the lungs consistent with pneumonia.   He was upposed to see me today to discuss the PET results but he no showed and canceled related to car issues.  He told my nurse that he has an appointment with medical oncology this afternoon and that he should be able to make it.    Recommendation was for antibiotics for pneumonia and then follow-up chest CT afterwards to verify resolution.  I sent a note to Dr Irene Limbo of Medical Oncology to inform him of this so he can address at f/u appt.  I will arrange for him to see otolaryngology for laryngoscopy in the next couple of months and I will stay in the loop to seen him back in a year, sooner if needed.    _____________________________________   Eppie Gibson, MD

## 2019-01-23 ENCOUNTER — Other Ambulatory Visit: Payer: Self-pay

## 2019-01-23 ENCOUNTER — Telehealth: Payer: Self-pay | Admitting: Hematology

## 2019-01-23 ENCOUNTER — Ambulatory Visit
Admission: RE | Admit: 2019-01-23 | Discharge: 2019-01-23 | Disposition: A | Discharge status: Home / Self Care | Payer: BC Managed Care – PPO | Source: Ambulatory Visit | Attending: Radiation Oncology | Admitting: Radiation Oncology

## 2019-01-23 ENCOUNTER — Inpatient Hospital Stay: Payer: BC Managed Care – PPO | Attending: Hematology | Admitting: Hematology

## 2019-01-23 ENCOUNTER — Telehealth: Payer: Self-pay

## 2019-01-23 ENCOUNTER — Inpatient Hospital Stay: Payer: BC Managed Care – PPO

## 2019-01-23 ENCOUNTER — Telehealth: Payer: Self-pay | Admitting: *Deleted

## 2019-01-23 VITALS — BP 112/71 | HR 78 | Temp 98.5°F | Resp 18 | Ht 72.0 in | Wt 137.0 lb

## 2019-01-23 DIAGNOSIS — R918 Other nonspecific abnormal finding of lung field: Secondary | ICD-10-CM | POA: Insufficient documentation

## 2019-01-23 DIAGNOSIS — J189 Pneumonia, unspecified organism: Secondary | ICD-10-CM

## 2019-01-23 DIAGNOSIS — Z79899 Other long term (current) drug therapy: Secondary | ICD-10-CM | POA: Diagnosis not present

## 2019-01-23 DIAGNOSIS — E440 Moderate protein-calorie malnutrition: Secondary | ICD-10-CM

## 2019-01-23 DIAGNOSIS — C320 Malignant neoplasm of glottis: Secondary | ICD-10-CM | POA: Diagnosis not present

## 2019-01-23 DIAGNOSIS — C329 Malignant neoplasm of larynx, unspecified: Secondary | ICD-10-CM | POA: Insufficient documentation

## 2019-01-23 DIAGNOSIS — Z7951 Long term (current) use of inhaled steroids: Secondary | ICD-10-CM | POA: Diagnosis not present

## 2019-01-23 DIAGNOSIS — Z87891 Personal history of nicotine dependence: Secondary | ICD-10-CM | POA: Diagnosis not present

## 2019-01-23 DIAGNOSIS — J690 Pneumonitis due to inhalation of food and vomit: Secondary | ICD-10-CM | POA: Diagnosis not present

## 2019-01-23 DIAGNOSIS — J440 Chronic obstructive pulmonary disease with acute lower respiratory infection: Secondary | ICD-10-CM | POA: Diagnosis not present

## 2019-01-23 MED ORDER — AMOXICILLIN-POT CLAVULANATE 875-125 MG PO TABS: 1.0000 | ORAL_TABLET | Freq: Two times a day (BID) | ORAL | 0 refills | Status: DC

## 2019-01-23 NOTE — Telephone Encounter (Signed)
CALLED PATIENT TO INFORM OF 1 YR. FU WITH DR. Isidore Moos ON 01-22-20 @ 11 AM, SPOKE WITH PATIENT AND HE AGREED  TO THIS APPT.

## 2019-01-23 NOTE — Telephone Encounter (Signed)
Scheduled appt per 9/2 los.  Left a very detailed voice message of appt date and time.  Also left central radiology number in the voice message.

## 2019-01-23 NOTE — Progress Notes (Signed)
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 is a 63 year old male with a past medical history including substance abuse and currently on methadone, emphysema, and bronchitis.  The patient was undergoing outpatient evaluation for lung nodules and a vocal cord lesion and presented to the emergency room with worsening dyspnea.  ENT was consulted for his vocal cord mass.  Due to the location of the laryngeal mass, the patient was in danger of developing an upper airway obstruction.  A tracheostomy was placed to protect his airway.  A biopsy of the mass was performed.  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  The patient tells me that he was having persistent upper respiratory symptoms with dyspnea and cough since about November 2019.  He was on several rounds of antibiotics and steroids without significant improvement.  He reports a 68-month history of hoarseness in his voice.  He has lost about 40 pounds over the past 6 months.  He also reports having a sore throat.  Denies difficulty swallowing to admission.  Breathing is better since placement of tracheostomy.  He has not had any fevers or chills.  Denies chest pain.  Denies nausea, vomiting, constipation, diarrhea.  He is on NG tube feeding at this time.  Working with speech therapy on swallowing.  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  The patient is widowed and currently lives alone.  Wife died  about 15 months ago secondary to lung cancer. Upon discharge, he plans to go live with 1 of his daughters in [place], [place].  He has 3 daughters who live locally.  1 of his daughters is an Therapist, sports who works on the oncology unit at [employer].   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 The patient's last visit with [place] was on 01/17/2019. The pt reports that he is doing well overall.  The pt reports that after contacting Dr.Teoh's office he was advised to clear his Pneumonia before setting up the next appointment. Pt is doing okay with his tube feeding and has been able to get to 100% nutrition at this time. Pt states that his phlegm has lessened in volume and turned yellow. Pt denies SOB and has not taken any breathing treatments lately. Pt has a small granuloma surrounding his tube in his abdomen. Pt is currently taking all of his medications in a liquid form.   Of note since the patient's last visit, pt has had NM PET Scan Whole Body ([id number]) completed on 01/22/2019 with results revealing "1. Essentially resolved hypermetabolism and abnormal soft tissue in the glottis, with residual low level uptake in the left glottis, favor post treatment change. 2. Extensive patchy and nodular foci of consolidation throughout both lungs with associated intense hypermetabolism, new since 10/16/2018 chest CT, nonspecific but favoring multifocal pneumonia. 3. New mild hypermetabolism within bilateral hilar and bilateral mediastinal lymph nodes, nonspecific, potentially reactive in the setting of suspected pneumonia, with metastatic disease not  entirely excluded. 4. New small dependent left pleural effusion. 5. Recommend follow-up PET-CT or chest CT with IV contrast in 3 months. 6. No evidence of hypermetabolic metastatic disease in the neck, abdomen, pelvis or skeleton. 7. Aortic Atherosclerosis (ICD10-I70.0)  and Emphysema (ICD10-J43.9)."  Lab results (01/22/19) of CBC w/diff and CMP is as follows: all values are WNL except for RBC at 3.18, Hgb at 9.6, HCT at 30.7, Lymphs Abs at 0.6K, Albumin at 2.4, Total Bilirubin at 0.2.  01/22/2019 Magnesium is at 2.1 01/22/2019 TSH at 0.733  On review of systems, pt reports  denies fever, chills, SOB and any other symptoms.   MEDICAL HISTORY:  Past Medical History:  Diagnosis Date   Bronchitis 03/2018   COPD (chronic obstructive pulmonary disease) (HCC)    Herpes    laryngeal ca dx'd 06/2018   Lung nodule 2020   Substance abuse ([place])    opiate addiction, been on methadone for 11 years    SURGICAL HISTORY: Past Surgical History:  Procedure Laterality Date   DIRECT LARYNGOSCOPY N/A 07/21/2018   Procedure: DIRECT LARYNGOSCOPY, TRACHEOSTOMY, BIOPSY;  Surgeon: Leta Baptist, MD;  Location: [facility];  Service: ENT;  Laterality: N/A;   IR GASTROSTOMY TUBE MOD SED  08/02/2018    SOCIAL HISTORY: Social History   Socioeconomic History   Marital status: Widowed    Spouse name: Not on file   Number of children: Not on file   Years of education: Not on file   Highest education level: Not on file  Occupational History   Not on file  Social Needs   Financial resource strain: Not on file   Food insecurity    Worry: Not on file    Inability: Not on file   Transportation needs    Medical: No    Non-medical: No  Tobacco Use   Smoking status: Former Smoker    Packs/day: 2.00    Years: 50.00    Pack years: 100.00    Types: Cigarettes   Smokeless tobacco: Never Used   Tobacco comment: 07/06/18 at 10 cigs per day, quit 06/16/18  Substance and Sexual Activity   Alcohol use: Not Currently   Drug use: Not Currently    Comment: hx of opiod abuse   Sexual activity: Not Currently    Birth control/protection: None  Lifestyle   Physical activity    Days per week: Not on file    Minutes per session: Not on file   Stress: Not on file    Relationships   Social connections    Talks on phone: Not on file    Gets together: Not on file    Attends religious service: Not on file    Active member of club or organization: Not on file    Attends meetings of clubs or organizations: Not on file    Relationship status: Not on file   Intimate partner violence    Fear of current or ex partner: No    Emotionally abused: No    Physically abused: No    Forced sexual activity: No  Other Topics Concern   Not on file  Social History Narrative   Not on file    FAMILY HISTORY: Family History  Problem Relation Age of Onset   Alcohol abuse Father    Cancer Father    Cirrhosis Father    Drug abuse Brother     ALLERGIES:  has No Known Allergies.  MEDICATIONS:  Current Outpatient Medications  Medication Sig Dispense Refill  albuterol (PROVENTIL HFA;VENTOLIN HFA) 108 (90 Base) MCG/ACT inhaler Inhale 1-2 puffs into the lungs every 6 (six) hours as needed for wheezing.      amoxicillin-clavulanate (AUGMENTIN) 875-125 MG tablet Take 1 tablet by mouth 2 (two) times daily. Patient recommended to take OTC probiotics with antibiotics. 42 tablet 0   budesonide (PULMICORT) 0.5 MG/2ML nebulizer solution INHALE CONTENTS OF 1 VIAL VIA NEBULIZER 2 TIMES DAILY 360 mL 5   ibuprofen (ADVIL,MOTRIN) 100 MG/5ML suspension Place 20-30 mLs (400-600 mg total) into feeding tube every 8 (eight) hours as needed for mild pain. 300 mL 0   ipratropium-albuterol (DUONEB) 0.5-2.5 (3) MG/3ML SOLN TAKE 3 MLS BY NEBULIZATION EVERY 6 (SIX) HOURS AS NEEDED. 1080 mL 2   methadone (DOLOPHINE) 10 MG/ML solution Take 110 mg by mouth daily.     Nutritional Supplements (FEEDING SUPPLEMENT, OSMOLITE 1.5 CAL,) LIQD Give 410 ml 4 times daily via feeding tube (7 cartons daily needed) 1659 mL 0   Nutritional Supplements (PROMOD) LIQD Give 30 mL Promod twice daily via PEG. 2 Bottle 2   ondansetron (ZOFRAN) 4 MG/5ML solution Place 10 mLs (8 mg total) into feeding  tube 2 (two) times daily as needed for nausea or vomiting. (Patient not taking: Reported on 11/19/2018) 100 mL 1   PERFOROMIST 20 MCG/2ML nebulizer solution INHALE CONTENTS OF 1 VIAL VIA NEBULIZER 2 TIMES DAILY 360 mL 2   prochlorperazine (COMPAZINE) 10 MG tablet Take 1 tablet (10 mg total) by mouth every 6 (six) hours as needed (Nausea or vomiting). (Patient not taking: Reported on 10/31/2018) 30 tablet 1   prochlorperazine (COMPAZINE) 5 MG tablet Place 1-2 tablets (5-10 mg total) into feeding tube every 6 (six) hours as needed for nausea or vomiting. (Patient not taking: Reported on 11/19/2018) 30 tablet 0   sucralfate (CARAFATE) 1 GM/10ML suspension TAKE 10 MLS (1 G TOTAL) BY MOUTH 2 (TWO) TIMES DAILY. (Patient not taking: Reported on 10/31/2018) 420 mL 0   Water For Irrigation, Sterile (FREE WATER) SOLN Place 300 mLs into feeding tube every 8 (eight) hours. 5000 mL 0   No current facility-administered medications for this visit.     REVIEW OF SYSTEMS:    A 10+ POINT REVIEW OF SYSTEMS WAS OBTAINED including neurology, dermatology, psychiatry, cardiac, respiratory, lymph, extremities, GI, GU, Musculoskeletal, constitutional, breasts, reproductive, HEENT.  All pertinent positives are noted in the HPI.  All others are negative.   PHYSICAL EXAMINATION: ECOG PERFORMANCE STATUS: 1 - Symptomatic but completely ambulatory  Vitals:   01/23/19 1326  BP: 112/71  Pulse: 78  Resp: 18  Temp: 98.5 F (36.9 C)  SpO2: 99%   Filed Weights   01/23/19 1326  Weight: 137 lb (62.1 kg)   .Body mass index is 18.58 kg/m.   Exam was given in a chair   GENERAL:alert, in no acute distress and comfortable SKIN: no acute rashes, no significant lesions EYES: conjunctiva are pink and non-injected, sclera anicteric OROPHARYNX: MMM, no exudates, no oropharyngeal erythema or ulceration NECK: supple, no JVD. Tracheostomy in situ LYMPH:  no palpable lymphadenopathy in the cervical, axillary or inguinal  regions LUNGS: clear to auscultation b/l with normal respiratory effort HEART: regular rate & rhythm ABDOMEN:  normoactive bowel sounds , non tender, not distended. No palpable hepatosplenomegaly. PEG tube in situ Extremity: no pedal edema PSYCH: alert & oriented x 3 with fluent speech NEURO: no focal motor/sensory deficits  LABORATORY DATA:  I have reviewed the data as listed  . CBC Latest Ref Rng & Units  01/22/2019 01/17/2019 12/12/2018  WBC 4.0 - 10.5 K/uL 7.8 8.6 5.6  Hemoglobin 13.0 - 17.0 g/dL 9.6(L) 10.4(L) 10.4(L)  Hematocrit 39.0 - 52.0 % 30.7(L) 33.2(L) 33.7(L)  Platelets 150 - 400 K/uL 282 234 291    . CMP Latest Ref Rng & Units 01/22/2019 01/17/2019 12/12/2018  Glucose 70 - 99 mg/dL 97 90 86  BUN 8 - 23 mg/dL 18 17 17   Creatinine 0.61 - 1.24 mg/dL 0.78 0.82 0.83  Sodium 135 - 145 mmol/L 138 138 140  Potassium 3.5 - 5.1 mmol/L 4.5 4.7 4.3  Chloride 98 - 111 mmol/L 100 99 102  CO2 22 - 32 mmol/L 31 30 27   Calcium 8.9 - 10.3 mg/dL 9.2 9.4 9.7  Total Protein 6.5 - 8.1 g/dL 7.6 8.0 8.3(H)  Total Bilirubin 0.3 - 1.2 mg/dL 0.2(L) 0.3 0.3  Alkaline Phos 38 - 126 U/L 68 72 71  AST 15 - 41 U/L 16 18 15   ALT 0 - 44 U/L 12 14 11      RADIOGRAPHIC STUDIES: I have personally reviewed the radiological images as listed and agreed with the findings in the report. Nm Pet Image Restag (ps) Skull Base To Thigh  Result Date: 01/22/2019 CLINICAL DATA:  Subsequent treatment strategy for locally advanced laryngeal cancer status post concurrent chemotherapy and radiation therapy. EXAM: NUCLEAR MEDICINE PET SKULL BASE TO THIGH TECHNIQUE: 6.7 mCi F-18 FDG was injected intravenously. Full-ring PET imaging was performed from the skull base to thigh after the radiotracer. CT data was obtained and used for attenuation correction and anatomic localization. Fasting blood glucose: 96 mg/dl COMPARISON:  07/17/2018 PET-CT.  10/16/2018 chest CT. FINDINGS: Mediastinal blood pool activity: SUV max 1.8 Liver  activity: SUV max NA NECK: No hypermetabolic lymph nodes in the neck. Low level uptake in the left glottis with max SUV 3.0, previously 14.2, essentially resolved, with no residual soft tissue mass or significant soft tissue asymmetry in the glottis on the CT images. Tracheostomy appears well-positioned with tube tip in the tracheal lumen at the level of the thoracic inlet. There is hypermetabolism associated with the tracheostomy site, favor inflammatory, with no mass or fluid collection correlate on the CT images. Incidental CT findings: Heterogeneous hypodense thyroid gland without thyroid hypermetabolism. CHEST: There is new mild hypermetabolism within paratracheal, AP window and subcarinal mediastinal lymph nodes. For example a 0.7 cm right paratracheal node with max SUV 3.8 (series 4/image 81), a 0.9 cm left subcarinal node with max SUV 4.6 (series 4/image 89) and an AP window 0.9 cm node with max SUV 3.9 (series 4/image 80). New mild hypermetabolism within bilateral hilar nodes with max SUV 3.3 on the right and 2.9 on the left. No enlarged or hypermetabolic axillary nodes. There are multiple new hypermetabolic foci of consolidation throughout both lungs involving the posterior right upper lobe, left upper lobe and left lower lobe, all new since 10/16/2018 chest CT. For example a posterior right lower lobe 3.0 x 2.1 cm focus of consolidation with max SUV 12.9 (series 8/image 19), a 6.0 x 2.8 cm left upper lobe focus of consolidation with max SUV 11.7 (series 8/image 40) and a 2.2 x 1.7 cm peripheral left lower lobe focus of consolidation with max SUV 8.8 (series 8/image 54). Additional scattered smaller hypermetabolic ill-defined nodular foci of consolidation are present in the left lower lobe. Previously visualized 1.0 cm solid right middle lobe pulmonary nodule is stable and non hypermetabolic (series 8/image 65). Sharply marginated patchy foci of consolidation and ground-glass opacity in the  anterior lung  apices bilaterally with associated hypermetabolism is compatible with evolving postradiation change. Incidental CT findings: New small dependent left pleural effusion. Moderate centrilobular emphysema. Coronary atherosclerosis. Atherosclerotic nonaneurysmal thoracic aorta. ABDOMEN/PELVIS: No abnormal hypermetabolic activity within the liver, pancreas, adrenal glands, or spleen. No hypermetabolic lymph nodes in the abdomen or pelvis. Incidental CT findings: Percutaneous gastrostomy tube tip is in place in the body of the stomach. Atherosclerotic nonaneurysmal abdominal aorta. Trace pelvic ascites. SKELETON: No focal hypermetabolic activity to suggest skeletal metastasis. Incidental CT findings: none IMPRESSION: 1. Essentially resolved hypermetabolism and abnormal soft tissue in the glottis, with residual low level uptake in the left glottis, favor post treatment change. 2. Extensive patchy and nodular foci of consolidation throughout both lungs with associated intense hypermetabolism, new since 10/16/2018 chest CT, nonspecific but favoring multifocal pneumonia. 3. New mild hypermetabolism within bilateral hilar and bilateral mediastinal lymph nodes, nonspecific, potentially reactive in the setting of suspected pneumonia, with metastatic disease not entirely excluded. 4. New small dependent left pleural effusion. 5. Recommend follow-up PET-CT or chest CT with IV contrast in 3 months. 6. No evidence of hypermetabolic metastatic disease in the neck, abdomen, pelvis or skeleton. 7. Aortic Atherosclerosis (ICD10-I70.0) and Emphysema (ICD10-J43.9). Electronically Signed   By: Ilona Sorrel M.D.   On: 01/22/2019 15:17     ASSESSMENT & PLAN:   63 y.o. male with  1. 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  Discussed with Dr. Benjamine Mola at bedside patient is not a candidate for larynx sparing surgery. He has  lost about 40 pounds and is nutritionally quite depleted. Heavy smoking history of 1 to 2 packs/day with COPD /emphysema.  Not on home oxygen. 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 Has tolerated his tracheostomy well and is been able to speak with Passy-Muir valve. Has had issues with swallowing and is still needing tube feeding.  07/17/18 PET/CT revealed he has multiple pulmonary nodules none of them are FDG avid. No FDG avid cervical lymphadenopathy. FDG avid laryngeal tumor noted.  10/16/18 CT Chest revealed "Since the prior CT, the patient has developed tree-in-bud type opacities, bronchial wall thickening, and bronchial mucous plugging, in the left lower lobe consistent with infection/inflammation. This is suspicious for MAI infection. The nodule previously measured in left lower lobe on the prior study has decreased in size as has and a smaller adjacent nodule. These findings all support a benign inflammatory etiology. 2. Nodules right middle lobe, with a dominant nodule measuring 1 cm, are all stable. 3. Stable changes of centrilobular emphysema. 4. Three-vessel coronary artery calcifications and aortic atherosclerosis, also stable. Aortic Atherosclerosis and Emphysema."  S/p 6 weekly doses of Carboplatin and Taxol with concurrent RT, completed 10/16/18. He received IMRT total dose of 70 Gray in 35 fractions.   PLAN: -Discussed pt labwork today, 01/22/19; all values are WNL except for RBC at 3.18, Hgb at 9.6, HCT at 30.7, Lymphs Abs at 0.6K, Albumin at 2.4, Total Bilirubin at 0.2.  -Discussed 01/22/2019 Magnesium is at 2.1 -Discussed 01/22/2019 TSH at 0.733 -Discussed 01/22/2019 NM PET Scan Whole Body ([id number]) which revealed "1. Essentially resolved hypermetabolism and abnormal soft tissue in the glottis, with residual low level uptake in the left glottis, favor post treatment change. 2. Extensive patchy  and nodular foci of consolidation throughout both lungs with associated intense hypermetabolism, new  since 10/16/2018 chest CT, nonspecific but favoring multifocal pneumonia. 3. New mild hypermetabolism within bilateral hilar and bilateral mediastinal lymph nodes, nonspecific, potentially reactive in the setting of suspected pneumonia, with metastatic disease not entirely excluded. 4. New small dependent left pleural effusion. 5. Recommend follow-up PET-CT or chest CT with IV contrast in 3 months. 6. No evidence of hypermetabolic metastatic disease in the neck, abdomen, pelvis or skeleton. 7. Aortic Atherosclerosis (ICD10-I70.0) and Emphysema (ICD10-J43.9)." -There is no laboratory or clinical evidence for progression of Squamous Cell Carcinoma at this time. -Recommend that pt take his breathing treatments more frequently to help clear his Pneumonia -Recommend live cultured yogurt or probiotic drinks to prevent C. Diff from antibiotics -Recommend f/u with Dr. Benjamine Mola for removal of tracheostomy -Plan to get a swallow study after f/u with Dr. Benjamine Mola -Rx Augmentin for 3-4 weeks for pt's likely aspiration Pneumonia -Will repeat CT chest scan in 7 weeks -Advised to contact us or go to ER if phlegm secretions significantly worsen to get IV antibiotics -Will see the pt back in 2 months with labs  FOLLOW UP: CT chest in 7 weeks RTC with Dr Irene Limbo with labs in 8 weeks  The total time spent in the appt was 20 minutes and more than 50% was on counseling and direct patient cares.  All of the patient's questions were answered with apparent satisfaction. The patient knows to call the clinic with any problems, questions or concerns.   Sullivan Lone MD [place] AAHIVMS [facility] Hematology/Oncology Physician [facility]  (Office):       [phone] (Work cell):  [phone] (Fax):           [phone]  01/23/2019 5:19 PM   I, Yevette Edwards, am acting as a scribe for Dr. Sullivan Lone.   .I have  reviewed the above documentation for accuracy and completeness, and I agree with the above. [name] MD

## 2019-01-23 NOTE — Telephone Encounter (Signed)
[name] to receive results from his PET scan yesterday. He had called earlier to cancel due to car problems but I did not receive the message. He does have an appointment with Dr. Irene Limbo this afternoon, that he will be able to be present for. I spoke with Dr. Isidore Moos and per her instructions I called Ronald Ruiz and informed him that his PET scan showed that his cancer has responded to treatment nicely. His PET does demonstrate that he has pneumonia. Dr. Isidore Moos will speak to Dr. Irene Limbo and inform him of the above and prescribe him an antibiotic for the pneumonia at his appointment Ruiz. He voiced his appreciation for the phone call, and knows to call with any further questions or concerns.

## 2019-01-29 ENCOUNTER — Inpatient Hospital Stay: Payer: BC Managed Care – PPO

## 2019-01-29 NOTE — Progress Notes (Signed)
Nutrition Follow-up:  Patient with cancer of the larynx, followed by Dr. Irene Limbo.  Patient has completed chemotherapy and radiation therapy.   Spoke with patient via phone this am for nutrition follow-up.  Patient reports that he was recently started on antibiotics for pneumonia.  Reports that he had diarrhea for about 2 days after starting medication but is has resolved.  Reports that he is hungry and wants to eat.  He has not really been eating much orally.  Next meeting with SLP on 9/28.    Reports that he is taking 7 bottles of osmolite 1.5 daily via feeding tube and only 54ml of promod daily.  Not taking more promod due to stickiness and hard to mix.  Knows he should be giving 16ml BID.      Medications: reviewed  Labs: reviewed  Anthropometrics:   Weight on 9/2 137 lb increased from weight on 8/27 of 135 lb 7/2 138 lb noted   Estimated Energy Needs  Kcals: 1980-2310 calories Protein: 100-112 g Fluid: 2.3 L  NUTRITION DIAGNOSIS: Unintentional weight loss stable   INTERVENTION:  Continue osmolite 1.5, 7 cartons per day with 60ml of promod BID.   Continued follow-up with SLP.     MONITORING, EVALUATION, GOAL: Patient will tolerate tube feeding and oral feeding to prevent further weight loss   NEXT VISIT: phone f/u on Tuesday Nov 3rd  Ronald Ruiz, [place], [place] Registered Dietitian [phone] (pager)

## 2019-02-01 ENCOUNTER — Telehealth: Payer: Self-pay | Admitting: *Deleted

## 2019-02-02 NOTE — Telephone Encounter (Signed)
Oncology Nurse Navigator Documentation  Per patient's post-treatment follow-up with Dr. Irene Limbo and 9/2 ENT Conference review of re-staging PET, called ENT Dr. Deeann Saint office to coordinate appointment.  Spoke with Nira Conn, requested next available f/u with Teoh, indicated patient - will need laryngoscopy and trach removal if scoping confirms NED.   She voiced understanding, stated patient will be  contacted and scheduled.   Gayleen Orem, RN, BSN Head & Neck Oncology Nurse [place] at [place] [phone]

## 2019-02-14 ENCOUNTER — Inpatient Hospital Stay: Payer: BC Managed Care – PPO | Admitting: Hematology

## 2019-02-18 ENCOUNTER — Ambulatory Visit: Payer: BC Managed Care – PPO

## 2019-03-11 ENCOUNTER — Other Ambulatory Visit: Payer: Self-pay | Admitting: Hematology

## 2019-03-11 DIAGNOSIS — J189 Pneumonia, unspecified organism: Secondary | ICD-10-CM

## 2019-03-12 ENCOUNTER — Ambulatory Visit (HOSPITAL_COMMUNITY): Admission: RE | Admit: 2019-03-12 | Payer: BC Managed Care – PPO | Source: Ambulatory Visit

## 2019-03-12 ENCOUNTER — Encounter (HOSPITAL_COMMUNITY): Payer: Self-pay

## 2019-03-12 ENCOUNTER — Telehealth: Payer: Self-pay | Admitting: *Deleted

## 2019-03-12 NOTE — Telephone Encounter (Signed)
Contacted patient  - informed him that Dr.Kale ordered a CXR, gave number for central scheduling, he will call and make appt for x-ray.

## 2019-03-12 NOTE — Telephone Encounter (Signed)
Attempted to contact patient to inform that Dr.Kale ordered Chest Xray. Call cut off/dropped x2.

## 2019-03-19 NOTE — Progress Notes (Signed)
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  The patient was undergoing outpatient evaluation for lung nodules and a vocal cord lesion and presented to the emergency room with worsening dyspnea.  ENT was consulted for his vocal cord mass.  Due to the location of the laryngeal mass, the patient was in danger of developing an upper airway obstruction.  A tracheostomy was placed to protect his airway.  A biopsy of the mass was performed.  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  The patient tells me that he was having persistent upper respiratory symptoms with dyspnea and cough since about November 2019.  He was on several rounds of antibiotics and steroids without significant improvement.  He reports a 75-monthhistory of hoarseness in his voice.  He has lost about 40 pounds over the past 6 months.  He also reports having a sore throat.  Denies difficulty swallowing to admission.  Breathing is better since placement of tracheostomy.  He has not had any fevers or chills.  Denies chest pain.  Denies nausea, vomiting, constipation, diarrhea.  He is on NG tube feeding at this time.  Working with speech therapy on swallowing.  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  The patient is widowed and currently lives alone.  Wife died  about 15 months ago secondary to lung cancer. Upon discharge, he [place]  He has 3 daughters who live locally.  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 The patient's last visit with [facility] on 01/23/2019. The pt reports that he is doing well overall.  The pt reports that he has been eating well through his PEG tube but cannot seem to gain any weight. Pt does have a follow up with Dr. TBenjamine Molabut they haven't taken his trach tube out yet due to pt's pneumonia infection. He has not had a swallow study done in the interim. He is still having a fair amount of oral secretions in the morning but is no longer having to use suction. His secretions are white or yellow and thick like phelgm. He has not seen any of his nutritional supplements in these oral secretions and their appearance has not changed after using antibiotics. Pt had diarrhea while taking antibiotics, that has now resolved. Pt notes that his breathing has been pretty consistent since his last visit. He is preparing to move from his home of 16 years and moving in with his daughter. 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 Pt reports improving leg swelling. Pt is currently smoking 1-2 cigarettes per day. He will see BErnestene Kielon Tuesday  to discuss his current nutrition.   Lab results today (03/20/19) of CBC w/diff and CMP is as follows: all values are WNL except for RBC at 3.76, Hgb at 11.3, Hct at 35.3, RDW at 16.4, Lymphs Abs at 0.6, Albumin at 3.0, Total Bilirubin at <0.2. 03/20/2019 Phosphorus at 4.1 03/20/2019 Magnesium at 2.1  On review of systems, pt reports improving leg swelling, increased appetite and denies fevers, chills, night sweats, SOB, diarrhea, neck  pain, new lumps/bumps, spine pain, abdominal pain, issues with his tube and any other symptoms.   MEDICAL HISTORY:  Past Medical History:  Diagnosis Date  . Bronchitis 03/2018  . COPD (chronic obstructive pulmonary disease) ([place])   . Herpes   . laryngeal ca dx'd 06/2018  . Lung nodule 2020  . Substance abuse ([place])    opiate addiction, been on methadone for 11 years    SURGICAL HISTORY: Past Surgical History:  Procedure Laterality Date  . DIRECT LARYNGOSCOPY N/A 07/21/2018   Procedure: DIRECT LARYNGOSCOPY, TRACHEOSTOMY, BIOPSY;  Surgeon: Leta Baptist, MD;  Location: [facility];  Service: ENT;  Laterality: N/A;  . IR GASTROSTOMY TUBE MOD SED  08/02/2018    SOCIAL HISTORY: Social History   Socioeconomic History  . Marital status: Widowed    Spouse name: Not on file  . Number of children: Not on file  . Years of education: Not on file  . Highest education level: Not on file  Occupational History  . Not on file  Social Needs  . Financial resource strain: Not on file  . Food insecurity    Worry: Not on file    Inability: Not on file  . Transportation needs    Medical: No    Non-medical: No  Tobacco Use  . Smoking status: Former Smoker    Packs/day: 2.00    Years: 50.00    Pack years: 100.00    Types: Cigarettes  . Smokeless tobacco: Never Used  . Tobacco comment: 07/06/18 at 10 cigs per day, quit 06/16/18  Substance and Sexual Activity  . Alcohol use: Not Currently  . Drug use: Not Currently    Comment: hx of opiod abuse  . Sexual activity: Not Currently    Birth control/protection: None  Lifestyle  . Physical activity    Days per week: Not on file    Minutes per session: Not on file  . Stress: Not on file  Relationships  . Social Herbalist on phone: Not on file    Gets together: Not on file    Attends religious service: Not on file    Active member of club or organization: Not on file    Attends meetings of clubs or organizations: Not on file    Relationship  status: Not on file  . Intimate partner violence    Fear of current or ex partner: No    Emotionally abused: No    Physically abused: No    Forced sexual activity: No  Other Topics Concern  . Not on file  Social History Narrative  . Not on file    FAMILY HISTORY: Family History  Problem Relation Age of Onset  . Alcohol abuse Father   . Cancer Father   . Cirrhosis Father   . Drug abuse Brother     ALLERGIES:  has No Known Allergies.  MEDICATIONS:  Current Outpatient Medications  Medication Sig Dispense Refill  . albuterol (PROVENTIL HFA;VENTOLIN HFA) 108 (90 Base) MCG/ACT inhaler Inhale 1-2 puffs into the lungs  every 6 (six) hours as needed for wheezing.     Marland Kitchen amoxicillin-clavulanate (AUGMENTIN) 875-125 MG tablet Take 1 tablet by mouth 2 (two) times daily. Patient recommended to take OTC probiotics with antibiotics. 42 tablet 0  . budesonide (PULMICORT) 0.5 MG/2ML nebulizer solution INHALE CONTENTS OF 1 VIAL VIA NEBULIZER 2 TIMES DAILY 360 mL 5  . ibuprofen (ADVIL,MOTRIN) 100 MG/5ML suspension Place 20-30 mLs (400-600 mg total) into feeding tube every 8 (eight) hours as needed for mild pain. 300 mL 0  . ipratropium-albuterol (DUONEB) 0.5-2.5 (3) MG/3ML SOLN TAKE 3 MLS BY NEBULIZATION EVERY 6 (SIX) HOURS AS NEEDED. 1080 mL 2  . methadone (DOLOPHINE) 10 MG/ML solution Take 110 mg by mouth daily.    . Nutritional Supplements (FEEDING SUPPLEMENT, OSMOLITE 1.5 CAL,) LIQD Give 410 ml 4 times daily via feeding tube (7 cartons daily needed) 1659 mL 0  . Nutritional Supplements (PROMOD) LIQD Give 30 mL Promod twice daily via PEG. 2 Bottle 2  . ondansetron (ZOFRAN) 4 MG/5ML solution Place 10 mLs (8 mg total) into feeding tube 2 (two) times daily as needed for nausea or vomiting. (Patient not taking: Reported on 11/19/2018) 100 mL 1  . PERFOROMIST 20 MCG/2ML nebulizer solution INHALE CONTENTS OF 1 VIAL VIA NEBULIZER 2 TIMES DAILY 360 mL 2  . prochlorperazine (COMPAZINE) 10 MG tablet Take 1  tablet (10 mg total) by mouth every 6 (six) hours as needed (Nausea or vomiting). (Patient not taking: Reported on 10/31/2018) 30 tablet 1  . prochlorperazine (COMPAZINE) 5 MG tablet Place 1-2 tablets (5-10 mg total) into feeding tube every 6 (six) hours as needed for nausea or vomiting. (Patient not taking: Reported on 11/19/2018) 30 tablet 0  . sucralfate (CARAFATE) 1 GM/10ML suspension TAKE 10 MLS (1 G TOTAL) BY MOUTH 2 (TWO) TIMES DAILY. (Patient not taking: Reported on 10/31/2018) 420 mL 0  . Water For Irrigation, Sterile (FREE WATER) SOLN Place 300 mLs into feeding tube every 8 (eight) hours. 5000 mL 0   No current facility-administered medications for this visit.     REVIEW OF SYSTEMS:   A 10+ POINT REVIEW OF SYSTEMS WAS OBTAINED including neurology, dermatology, psychiatry, cardiac, respiratory, lymph, extremities, GI, GU, Musculoskeletal, constitutional, breasts, reproductive, HEENT.  All pertinent positives are noted in the HPI.  All others are negative.   PHYSICAL EXAMINATION: ECOG PERFORMANCE STATUS: 1 - Symptomatic but completely ambulatory  Vitals:   03/20/19 1404  BP: 116/72  Pulse: 72  Resp: 18  Temp: 98.2 F (36.8 C)  SpO2: 100%   Filed Weights   03/20/19 1404  Weight: 132 lb 12.8 oz (60.2 kg)   .Body mass index is 18.01 kg/m.   GENERAL:alert, in no acute distress and comfortable SKIN: no acute rashes, no significant lesions EYES: conjunctiva are pink and non-injected, sclera anicteric OROPHARYNX: MMM, no exudates, no oropharyngeal erythema or ulceration NECK: PEG tube in situ LYMPH:  no palpable lymphadenopathy in the cervical, axillary or inguinal regions LUNGS: clear to auscultation b/l with normal respiratory effort HEART: regular rate & rhythm ABDOMEN:  normoactive bowel sounds , non tender, not distended. No palpable hepatosplenomegaly.  Extremity: no pedal edema PSYCH: alert & oriented x 3 with fluent speech NEURO: no focal motor/sensory deficits   LABORATORY DATA:  I have reviewed the data as listed  . CBC Latest Ref Rng & Units 03/20/2019 01/22/2019 01/17/2019  WBC 4.0 - 10.5 K/uL 4.8 7.8 8.6  Hemoglobin 13.0 - 17.0 g/dL 11.3(L) 9.6(L) 10.4(L)  Hematocrit 39.0 - 52.0 %  35.3(L) 30.7(L) 33.2(L)  Platelets 150 - 400 K/uL 162 282 234    . CMP Latest Ref Rng & Units 03/20/2019 01/22/2019 01/17/2019  Glucose 70 - 99 mg/dL 96 97 90  BUN 8 - 23 mg/dL 21 18 17  Creatinine 0.61 - 1.24 mg/dL 0.77 0.78 0.82  Sodium 135 - 145 mmol/L 142 138 138  Potassium 3.5 - 5.1 mmol/L 4.6 4.5 4.7  Chloride 98 - 111 mmol/L 102 100 99  CO2 22 - 32 mmol/L 30 31 30  Calcium 8.9 - 10.3 mg/dL 9.3 9.2 9.4  Total Protein 6.5 - 8.1 g/dL 7.5 7.6 8.0  Total Bilirubin 0.3 - 1.2 mg/dL <0.2(L) 0.2(L) 0.3  Alkaline Phos 38 - 126 U/L 75 68 72  AST 15 - 41 U/L 16 16 18  ALT 0 - 44 U/L 11 12 14     RADIOGRAPHIC STUDIES: I have personally reviewed the radiological images as listed and agreed with the findings in the report. Dg Chest 2 View  Result Date: 03/20/2019 CLINICAL DATA:  Pneumonia, infiltrates on PET-CT; history laryngeal cancer EXAM: CHEST - 2 VIEW COMPARISON:  Chest radiograph 07/21/2018, PET-CT 01/22/2019 FINDINGS: Tracheostomy tube projects over tracheal air column. Normal heart size, mediastinal contours, and pulmonary vascularity. Atherosclerotic calcification aorta. Emphysematous and minimal bronchitic changes consistent with COPD. Patchy opacity LEFT mid lung, slightly inner, significantly improved since prior PET-CT. Additional opacity in the RIGHT upper lobe/at RIGHT apex, improved as well. Underlying masses/nodules at the sites are not completely excluded however continued follow-up until resolution recommended. No additional infiltrate, pleural effusion or pneumothorax. Bones demineralized. IMPRESSION: COPD changes with patchy opacities at the RIGHT apex and the LEFT mid lung, both appearing improved since prior PET-CT consistent with improving  pneumonia. However underlying mass/nodule at the sites is not completely excluded and continued radiographic follow-up until resolution recommended to exclude underlying tumor. Electronically Signed   By: Mark  Boles M.D.   On: 03/20/2019 15:50     ASSESSMENT & PLAN:   63 y.o. male with  1. 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  Discussed with Dr. Teoh at bedside patient is not a candidate for larynx sparing surgery. He has lost about 40 pounds and is nutritionally quite depleted. Heavy smoking history of 1 to 2 packs/day with COPD /emphysema.  Not on home oxygen. 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 Has tolerated his tracheostomy well and is been able to speak with Passy-Muir valve. Has had issues with swallowing and is still needing tube feeding.  07/17/18 PET/CT revealed he has multiple pulmonary nodules none of them are FDG avid. No FDG avid cervical lymphadenopathy. FDG avid laryngeal tumor noted.  10/16/18 CT Chest revealed "Since the prior CT, the patient has developed tree-in-bud type opacities, bronchial wall thickening, and bronchial mucous plugging, in the left lower lobe consistent with infection/inflammation. This is suspicious for MAI infection. The nodule previously measured in left lower lobe on the prior study has decreased in size as has and a smaller adjacent nodule. These findings all support a benign inflammatory etiology. 2. Nodules right middle lobe, with a dominant nodule measuring 1 cm, are all stable. 3. Stable changes of centrilobular emphysema. 4. Three-vessel coronary artery calcifications and aortic atherosclerosis, also stable. Aortic Atherosclerosis and Emphysema."  S/p 6 weekly doses of Carboplatin and Taxol with concurrent   RT, completed 10/16/18. He received  IMRT total dose of 70 Gray in 35 fractions.  01/22/2019 NM PET Scan Whole Body ([id number]) revealed "1. Essentially resolved hypermetabolism and abnormal soft tissue in the glottis, with residual low level uptake in the left glottis, favor post treatment change. 2. Extensive patchy and nodular foci of consolidation throughout both lungs with associated intense hypermetabolism, new since 10/16/2018 chest CT, nonspecific but favoring multifocal pneumonia. 3. New mild hypermetabolism within bilateral hilar and bilateral mediastinal lymph nodes, nonspecific, potentially reactive in the setting of suspected pneumonia, with metastatic disease not entirely excluded. 4. New small dependent left pleural effusion. 5. Recommend follow-up PET-CT or chest CT with IV contrast in 3 months. 6. No evidence of hypermetabolic metastatic disease in the neck, abdomen, pelvis or skeleton. 7. Aortic Atherosclerosis (ICD10-I70.0) and Emphysema (ICD10-J43.9)."  PLAN: -Discussed pt labwork today, 03/20/19; blood counts look good, anemia is nearly resolved, blood counts are normal.  -Discussed 03/20/2019 Phosphorus at 4.1 -Discussed 03/20/2019 Magnesium at 2.1 -There is no laboratory or clinical evidence for progression of Squamous Cell Carcinoma at this time  -Advised pt to let Barbara Neff know if his current nutritional needs are not being met -Recommended complete smoking cessation  -Recommend f/u with Dr. Teoh for consideration of removal of tracheostomy -Plan to get a swallow study after f/u with Dr. Teoh -Will see back in 3 months with labs   FOLLOW UP: RTC with Dr Kale with labs in 3 months  The total time spent in the appt was 20 minutes and more than 50% was on counseling and direct patient cares.  All of the patient's questions were answered with apparent satisfaction. The patient knows to call the clinic with any problems, questions or concerns.   Gautam Kale MD MS AAHIVMS SCH CTH Hematology/Oncology  Physician [facility]  (Office):       [phone] (Work cell):  [phone] (Fax):           [phone]  03/20/2019 4:20 PM   I, Jazzmine Knight, am acting as a scribe for Dr. Gautam Kale.   .I have reviewed the above documentation for accuracy and completeness, and I agree with the above. .Gautam Kishore Kale MD       

## 2019-03-20 ENCOUNTER — Inpatient Hospital Stay (HOSPITAL_BASED_OUTPATIENT_CLINIC_OR_DEPARTMENT_OTHER): Payer: BC Managed Care – PPO | Admitting: Hematology

## 2019-03-20 ENCOUNTER — Ambulatory Visit (HOSPITAL_COMMUNITY)
Admission: RE | Admit: 2019-03-20 | Discharge: 2019-03-20 | Disposition: A | Discharge status: Home / Self Care | Payer: BC Managed Care – PPO | Source: Ambulatory Visit | Attending: Hematology | Admitting: Hematology

## 2019-03-20 ENCOUNTER — Inpatient Hospital Stay: Payer: BC Managed Care – PPO | Attending: Hematology

## 2019-03-20 ENCOUNTER — Other Ambulatory Visit: Payer: Self-pay

## 2019-03-20 VITALS — BP 116/72 | HR 72 | Temp 98.2°F | Resp 18 | Ht 72.0 in | Wt 132.8 lb

## 2019-03-20 DIAGNOSIS — R634 Abnormal weight loss: Secondary | ICD-10-CM | POA: Diagnosis not present

## 2019-03-20 DIAGNOSIS — E440 Moderate protein-calorie malnutrition: Secondary | ICD-10-CM | POA: Diagnosis not present

## 2019-03-20 DIAGNOSIS — I2510 Atherosclerotic heart disease of native coronary artery without angina pectoris: Secondary | ICD-10-CM | POA: Insufficient documentation

## 2019-03-20 DIAGNOSIS — C320 Malignant neoplasm of glottis: Secondary | ICD-10-CM | POA: Diagnosis not present

## 2019-03-20 DIAGNOSIS — J439 Emphysema, unspecified: Secondary | ICD-10-CM | POA: Insufficient documentation

## 2019-03-20 DIAGNOSIS — Z87891 Personal history of nicotine dependence: Secondary | ICD-10-CM | POA: Insufficient documentation

## 2019-03-20 DIAGNOSIS — Z7951 Long term (current) use of inhaled steroids: Secondary | ICD-10-CM | POA: Diagnosis not present

## 2019-03-20 DIAGNOSIS — Z930 Tracheostomy status: Secondary | ICD-10-CM | POA: Diagnosis not present

## 2019-03-20 DIAGNOSIS — J029 Acute pharyngitis, unspecified: Secondary | ICD-10-CM | POA: Insufficient documentation

## 2019-03-20 DIAGNOSIS — J189 Pneumonia, unspecified organism: Secondary | ICD-10-CM | POA: Insufficient documentation

## 2019-03-20 DIAGNOSIS — Z79899 Other long term (current) drug therapy: Secondary | ICD-10-CM | POA: Diagnosis not present

## 2019-03-20 DIAGNOSIS — I700 Atherosclerosis of aorta: Secondary | ICD-10-CM | POA: Diagnosis not present

## 2019-03-20 DIAGNOSIS — Z791 Long term (current) use of non-steroidal anti-inflammatories (NSAID): Secondary | ICD-10-CM | POA: Insufficient documentation

## 2019-03-20 DIAGNOSIS — R600 Localized edema: Secondary | ICD-10-CM | POA: Insufficient documentation

## 2019-03-20 DIAGNOSIS — E860 Dehydration: Secondary | ICD-10-CM

## 2019-03-20 LAB — CMP (CANCER CENTER ONLY)
ALT: 11 U/L (ref 0–44)
AST: 16 U/L (ref 15–41)
Albumin: 3 g/dL — ABNORMAL LOW (ref 3.5–5.0)
Alkaline Phosphatase: 75 U/L (ref 38–126)
Anion gap: 10 (ref 5–15)
BUN: 21 mg/dL (ref 8–23)
CO2: 30 mmol/L (ref 22–32)
Calcium: 9.3 mg/dL (ref 8.9–10.3)
Chloride: 102 mmol/L (ref 98–111)
Creatinine: 0.77 mg/dL (ref 0.61–1.24)
GFR, Est AFR Am: >60 mL/min (ref >60)
GFR, Estimated: >60 mL/min (ref >60)
Glucose, Bld: 96 mg/dL (ref 70–99)
Potassium: 4.6 mmol/L (ref 3.5–5.1)
Sodium: 142 mmol/L (ref 135–145)
Total Bilirubin: <0.2 mg/dL — ABNORMAL LOW (ref 0.3–1.2)
Total Protein: 7.5 g/dL (ref 6.5–8.1)

## 2019-03-20 LAB — PHOSPHORUS: Phosphorus: 4.1 mg/dL (ref 2.5–4.6)

## 2019-03-20 LAB — CBC WITH DIFFERENTIAL/PLATELET
Abs Immature Granulocytes: 0.01 10*3/uL (ref 0.00–0.07)
Basophils Absolute: 0 10*3/uL (ref 0.0–0.1)
Basophils Relative: 0 %
Eosinophils Absolute: 0.1 10*3/uL (ref 0.0–0.5)
Eosinophils Relative: 2 %
HCT: 35.3 % — ABNORMAL LOW (ref 39.0–52.0)
Hemoglobin: 11.3 g/dL — ABNORMAL LOW (ref 13.0–17.0)
Immature Granulocytes: 0 %
Lymphocytes Relative: 13 %
Lymphs Abs: 0.6 10*3/uL — ABNORMAL LOW (ref 0.7–4.0)
MCH: 30.1 pg (ref 26.0–34.0)
MCHC: 32 g/dL (ref 30.0–36.0)
MCV: 93.9 fL (ref 80.0–100.0)
Monocytes Absolute: 0.6 10*3/uL (ref 0.1–1.0)
Monocytes Relative: 12 %
Neutro Abs: 3.4 10*3/uL (ref 1.7–7.7)
Neutrophils Relative %: 73 %
Platelets: 162 10*3/uL (ref 150–400)
RBC: 3.76 MIL/uL — ABNORMAL LOW (ref 4.22–5.81)
RDW: 16.4 % — ABNORMAL HIGH (ref 11.5–15.5)
WBC: 4.8 10*3/uL (ref 4.0–10.5)
nRBC: 0 % (ref 0.0–0.2)

## 2019-03-20 LAB — MAGNESIUM: Magnesium: 2.1 mg/dL (ref 1.7–2.4)

## 2019-03-21 ENCOUNTER — Telehealth: Payer: Self-pay | Admitting: Hematology

## 2019-03-21 NOTE — Telephone Encounter (Signed)
Scheduled per los. Called and left msg. Mailed printout  °

## 2019-03-26 ENCOUNTER — Inpatient Hospital Stay: Payer: BC Managed Care – PPO | Attending: Hematology

## 2019-03-26 NOTE — Progress Notes (Signed)
Nutrition Follow-up:  Patient with cancer of the larynx, followed by Dr Irene Limbo.  Patient has completed chemotherapy and radiation therapy.    Spoke with patient via phone for nutrition follow-up.  Patient reports that he has been taking 7 cartons of osmolite 1.5 and has increased to 8 bottles (2 cartons QID) over the last 2 weeks and has increased promod to 3 oz daily over the last 2 weeks. Patient did report that he did not have to order more promod this month from DME as he has not been giving it consistently.   Reports giving 16 oz of water 4 times per day at each feeding.  Reports has occasional diarrhea.  May not have bowel movement for day or 2 then has bowel movement with diarrhea 1-2 times during the day.  Patient frustrated that he is not gaining weight.  Noted patient did not show up for SLP appt on 9/28.    Medications: reviewed  Labs: reviewed  Anthropometrics:   Weight decreased to 132 lb 12.8 oz on 10/28 from 137 lb on 9/2.     Re-Estimated Energy Needs  Kcals: 1800-2100 (plus 500 calories/wt gain) 2300-2600 Protein: 115-130 g Fluid: > 2 L  NUTRITION DIAGNOSIS: Unintentional weight loss continues   INTERVENTION:  Encouraged patient to continued prescribed tube feeding regimen for weight gain.  Osmolite 1.5, 7 cartons per day and promod (switched to prosource by DME) 48ml BID for the next month and evaluate weight.  Patient has not been giving prescribed amount as did not order more protein modular this month.   Recommend patient to give 8oz or 269ml of water QID at each feeding for flush.  Tube feeding regimen will provide 2685 calories, 124 g protein and 2581ml (formula, flush with formula and prosource).   Patient verbalized understanding and will work to get prescribed tube feeding amount over the next 4 weeks.  Discussed option of changing formula to TwoCal HN as patient wanting to know about other formulas.  Discussed possible side effects with TwoCal HN.  Patient  wanted to keep osmolite 1.5 at this time.  Patient has contact information     MONITORING, EVALUATION, GOAL: Patient will tolerate tube feeding to prevent further weight loss.    NEXT VISIT: phone f/u Dec 1   Remington Highbaugh B. Zenia Resides, [place], [place] Registered Dietitian [phone] (pager)

## 2019-04-22 ENCOUNTER — Telehealth: Payer: Self-pay | Admitting: Hematology

## 2019-04-23 ENCOUNTER — Inpatient Hospital Stay: Payer: BC Managed Care – PPO | Attending: Hematology

## 2019-04-23 NOTE — Progress Notes (Signed)
Nutrition Follow-up:  Patient with cancer of the larynx, followed by Dr. Irene Limbo.  Patient has completed chemotherapy and radiation therapy on 10/16/18.    Spoke with patient via phone for nutrition follow-up.  Patient reports that he has been giving 7 cartons of osmolite 1.5 (2 cartons, 2 cartons, 2 cartons and 1 carton).  Flushing tube with 12 oz water 4 times per day.  Reports that he is giving 3 oz of prosource (protein modular) 1 time per day and mixing with 6 oz water.   Reports that he is not taking anything by mouth.  Has not made follow-up visit with Dr. Benjamine Mola yet.  Says he will call his office today.  Last SLP visit on 9/28 patient was no show.    Patient reports that he has bowel movement every 2-3 days which is normal for him.  Sometimes has diarrhea, loose stools.  Feels stronger and better.     Medications: reviewed  Labs: no new labs  Anthropometrics:   Weight 134 lb per patient report on home scale today.  Increased from clinic weight of 132 lb on 10/28.     Estimated Energy Needs  Kcals: 1800-2100 (plus 500 calories.wt gain) 2300-2600 Protein: 115-130 g Fluid: > 2 L  NUTRITION DIAGNOSIS: Unintentional weight loss stable   INTERVENTION:  Patient to continue 7 cartons of osmolite 1.5 via feeding tube daily. Flush with 8 oz of water vs 12 oz.   Recommend 2 oz (81ml) daily of prosource and patient to flush with 6 oz water.   Tube feeding will provide: 2685 calories, 124 g protein and 2436ml water Encouraged patient follow-up with Dr Deeann Saint office.  Patient has enteral tube feeding supplies from [employer] and knows how to contact company.      MONITORING, EVALUATION, GOAL: Patient will tolerate tube feeding to prevent further weight loss   NEXT VISIT: phone f/u Feb 2nd.    Addley Ballinger B. Zenia Resides, [place], [place] Registered Dietitian [phone] (pager)

## 2019-05-06 ENCOUNTER — Other Ambulatory Visit: Payer: Self-pay

## 2019-05-06 ENCOUNTER — Ambulatory Visit (INDEPENDENT_AMBULATORY_CARE_PROVIDER_SITE_OTHER)
Admission: RE | Admit: 2019-05-06 | Discharge: 2019-05-06 | Disposition: A | Discharge status: Home / Self Care | Payer: BC Managed Care – PPO | Source: Ambulatory Visit | Attending: Pulmonary Disease | Admitting: Pulmonary Disease

## 2019-05-06 DIAGNOSIS — R911 Solitary pulmonary nodule: Secondary | ICD-10-CM

## 2019-05-07 ENCOUNTER — Telehealth: Payer: Self-pay | Admitting: *Deleted

## 2019-05-07 NOTE — Telephone Encounter (Signed)
Oncology Nurse Navigator Documentation  Called ENT Teoh's office to check on status of pt's follow-up, spoke with Vira Agar.  She indicated Ronald Ruiz was last seen by Dr. Benjamine Mola 07/21/18.  She noted they called him 9/25 to schedule appt but he did not return call.  Called Mr. Delaguila.  Stressed the importance of follow-up with Dr. Benjamine Mola after which he will scheduled for MBSS per his 10/28 appt with Dr. Irene Limbo.  I explained MBSS needed to assess swallowing function so Nutritionist and SLP can provide him the appropriate guidance.  He voiced understanding.   I provided him Dr. Deeann Saint number, encouraged him to call after our conversation, asked him to call me back with appt date.  He agreed to do so.  Dr. Irene Limbo provided update.  Gayleen Orem, RN, BSN Head & Neck Oncology Nurse [place] [facility] [place] [phone]

## 2019-05-10 ENCOUNTER — Other Ambulatory Visit: Payer: Self-pay

## 2019-05-10 ENCOUNTER — Encounter: Payer: Self-pay | Admitting: Pulmonary Disease

## 2019-05-10 ENCOUNTER — Ambulatory Visit (INDEPENDENT_AMBULATORY_CARE_PROVIDER_SITE_OTHER): Payer: BC Managed Care – PPO | Admitting: Pulmonary Disease

## 2019-05-10 DIAGNOSIS — R918 Other nonspecific abnormal finding of lung field: Secondary | ICD-10-CM

## 2019-05-10 MED ORDER — NICOTINE 14 MG/24HR TD PT24: 14.0000 mg | MEDICATED_PATCH | Freq: Every day | TRANSDERMAL | 0 refills | Status: DC

## 2019-05-10 MED ORDER — AMOXICILLIN-POT CLAVULANATE 875-125 MG PO TABS: 1.0000 | ORAL_TABLET | Freq: Two times a day (BID) | ORAL | 0 refills | Status: DC

## 2019-05-10 NOTE — Progress Notes (Signed)
Augmentin changed from 7 days to 14 days. Patient and pharmacy made aware.

## 2019-05-10 NOTE — Progress Notes (Addendum)
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  Had a PEG tube placement on 07/24/2024.  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   States that his breathing is doing well with no issues Here for review of CT scan.  Continues to smoke. He has a PEG tube in place for nutrition but has occasional p.o. intake  Outpatient Encounter Medications as of 05/10/2019  Medication Sig  . budesonide (PULMICORT) 0.5 MG/2ML nebulizer solution INHALE CONTENTS OF 1 VIAL VIA NEBULIZER 2 TIMES DAILY  . ipratropium-albuterol (DUONEB) 0.5-2.5 (3) MG/3ML SOLN TAKE 3 MLS BY NEBULIZATION EVERY 6 (SIX) HOURS AS NEEDED.  . methadone (DOLOPHINE) 10 MG/ML solution Take 110 mg by mouth daily.  . Nutritional Supplements (FEEDING SUPPLEMENT, OSMOLITE 1.5 CAL,) LIQD Give 410 ml 4 times daily via feeding tube (7 cartons daily needed)  . Nutritional Supplements (PROMOD) LIQD Give 30 mL Promod twice daily via PEG.  . PERFOROMIST 20 MCG/2ML nebulizer solution INHALE CONTENTS OF 1 VIAL VIA NEBULIZER 2 TIMES DAILY  . sucralfate (CARAFATE) 1 GM/10ML suspension TAKE 10 MLS (1 G TOTAL) BY  MOUTH 2 (TWO) TIMES DAILY.  Marland Kitchen Water For Irrigation, Sterile (FREE WATER) SOLN Place 300 mLs into feeding tube every 8 (eight) hours.  Marland Kitchen albuterol (PROVENTIL HFA;VENTOLIN HFA) 108 (90 Base) MCG/ACT inhaler Inhale 1-2 puffs into the lungs every 6 (six) hours as needed for wheezing.   Marland Kitchen amoxicillin-clavulanate (AUGMENTIN) 875-125 MG tablet Take 1 tablet by mouth 2 (two) times daily.  . nicotine (NICODERM CQ - DOSED IN MG/24 HOURS) 14 mg/24hr patch Place 1 patch (14 mg total) onto the skin daily.  . [DISCONTINUED] amoxicillin-clavulanate (AUGMENTIN) 875-125 MG tablet Take 1 tablet by mouth 2 (two) times daily. Patient recommended to take OTC probiotics with antibiotics.  . [DISCONTINUED] ibuprofen (ADVIL,MOTRIN) 100 MG/5ML suspension Place 20-30 mLs (400-600 mg total) into feeding tube every 8 (eight) hours as needed for mild pain.  . [DISCONTINUED] ondansetron (ZOFRAN) 4 MG/5ML solution Place 10 mLs (8 mg total) into feeding tube 2 (two) times daily as needed for nausea or vomiting. (Patient not taking: Reported on 11/19/2018)  . [DISCONTINUED] prochlorperazine (COMPAZINE) 10 MG tablet Take 1 tablet (10 mg total) by mouth every 6 (six) hours as needed (Nausea or vomiting). (Patient not taking: Reported on 10/31/2018)  . [DISCONTINUED] prochlorperazine (COMPAZINE) 5 MG tablet Place 1-2 tablets (5-10 mg total) into feeding tube every 6 (six) hours as needed for nausea or vomiting. (Patient not taking: Reported on 11/19/2018)   No facility-administered encounter medications on file as of 05/10/2019.   Physical Exam: Blood pressure 114/76, pulse 67, temperature (!) 97.4 F (36.3 C), temperature source Temporal, height 6' (1.829  m), weight 131 lb (59.4 kg), SpO2 (!) 65 %. Gen:      Frail, malnourished HEENT:  EOMI, sclera anicteric Neck:     No masses; no thyromegaly Lungs:    Clear to auscultation bilaterally; normal respiratory effort CV:         Regular rate and rhythm; no murmurs Abd:      + bowel  sounds; soft, non-tender; no palpable masses, no distension Ext:    No edema; adequate peripheral perfusion Skin:      Warm and dry; no rash Neuro: alert and oriented x 3 Psych: normal mood and affect  Data Reviewed: Imaging: CT chest 07/03/2018-mild centrilobular emphysema, fine nodularity in the lower lobes, middle lobe and lingula.  1.1 irregular elongated nodule in the left lower lobe, 1 cm pulmonary nodule in the right middle lobe.  I have reviewed the images personally.   PET scan 07/17/2018- no significant PET uptake in the right middle lobe and left lower lobe nodule.  Hypermetabolism in the left vocal cord.  Emphysema and bronchial wall thickening  CT chest 10/16/2018-Tree in bud opacity, bronchial wall thickening with mucous plugging.  Lung nodules are stable.    CT chest 04/26/2019-9.2 cm masslike opacity in the right upper and middle lobes with central necrosis.  Additional patchy opacities in the left upper lobe, lingula and lower lobes.  Emphysema.  I have reviewed the images.  FENO 07/06/2018-11   Labs CBC 11/02/2018-WBC 7.3, eos 2% and absolute eosinophil count 146 IgE 07/06/2018- 106 Alpha-1 antitrypsin 07/06/2018-199, PIMM  Assessment:  Abnormal CT CT reviewed with new masslike consolidative changes in the right lung and scattered groundglass opacities, consolidation consistent with recurrent aspiration pneumonia. We will treat him with Augmentin for 14 days and follow-up scan in 4 to 8 weeks to make sure that opacity is improving and there is no underlying malignancy.  We will touch base with his speech pathologist to make sure he is being evaluated for ongoing aspiration  Chronic bronchitis, emphysema Likely has COPD based on his presentation and smoking history. Hold off on PFTs due to COVID restrictions and also he has a trach in place.  Continue Pulmicort, performist nebs.  Active smoker Smoking cessation strongly encouraged.  Prescribe nicotine patches Time  spent counseling- 5 mins.  Reassess at return visit  Plan/Recommendations: - Continue performist, Pulmicort - Augmentin for 14 days - Follow-up CT - Nicotine patches for smoking cessation  Marshell Garfinkel MD [facility] 05/10/2019, 1:27 PM  CC: [name], MD   Addendum: Received note from ENT, Dr. Benjamine Mola dated 05/31/2019 Tracheostomy was decannulated in the office without any issue.  Marshell Garfinkel MD [facility] 06/12/2019, 1:38 PM

## 2019-05-10 NOTE — Addendum Note (Signed)
Addended by: Hildred Alamin I on: 05/10/2019 04:23 PM   Modules accepted: Orders

## 2019-05-10 NOTE — Patient Instructions (Addendum)
We will give you Augmentin 875 mg twice daily for 14 days Nicotine patches to help with smoking cessation Follow-up CT without contrast in 4 to 8 weeks Follow-up in clinic after CT scan

## 2019-06-10 ENCOUNTER — Other Ambulatory Visit: Payer: Self-pay

## 2019-06-10 ENCOUNTER — Ambulatory Visit (INDEPENDENT_AMBULATORY_CARE_PROVIDER_SITE_OTHER)
Admission: RE | Admit: 2019-06-10 | Discharge: 2019-06-10 | Disposition: A | Discharge status: Home / Self Care | Payer: BC Managed Care – PPO | Source: Ambulatory Visit | Attending: Pulmonary Disease | Admitting: Pulmonary Disease

## 2019-06-10 DIAGNOSIS — R918 Other nonspecific abnormal finding of lung field: Secondary | ICD-10-CM

## 2019-06-18 ENCOUNTER — Other Ambulatory Visit: Payer: Self-pay

## 2019-06-18 ENCOUNTER — Encounter: Payer: Self-pay | Admitting: Pulmonary Disease

## 2019-06-18 ENCOUNTER — Ambulatory Visit (INDEPENDENT_AMBULATORY_CARE_PROVIDER_SITE_OTHER): Payer: BC Managed Care – PPO | Admitting: Pulmonary Disease

## 2019-06-18 VITALS — BP 108/64 | HR 60 | Temp 97.6°F | Ht 72.0 in | Wt 128.0 lb

## 2019-06-18 DIAGNOSIS — J449 Chronic obstructive pulmonary disease, unspecified: Secondary | ICD-10-CM

## 2019-06-18 DIAGNOSIS — T17908A Unspecified foreign body in respiratory tract, part unspecified causing other injury, initial encounter: Secondary | ICD-10-CM | POA: Insufficient documentation

## 2019-06-18 DIAGNOSIS — T17908D Unspecified foreign body in respiratory tract, part unspecified causing other injury, subsequent encounter: Secondary | ICD-10-CM

## 2019-06-18 NOTE — Patient Instructions (Signed)
Will order CT chest without contrast in 6 months to ensure that the pneumonia is continuing to clear up Continue to work on smoking cessation Return to clinic after CT scan.

## 2019-06-18 NOTE — Progress Notes (Deleted)
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  Had a PEG tube placement on 07/24/2024.  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   States that his breathing is doing well with no issues Here for review of CT scan.  Continues to smoke. He has a PEG tube in place for nutrition but has occasional p.o. intake  Outpatient Encounter Medications as of 06/18/2019  Medication Sig  . albuterol (PROVENTIL HFA;VENTOLIN HFA) 108 (90 Base) MCG/ACT inhaler Inhale 1-2 puffs into the lungs every 6 (six) hours as needed for wheezing.   . budesonide (PULMICORT) 0.5 MG/2ML nebulizer solution INHALE CONTENTS OF 1 VIAL VIA NEBULIZER 2 TIMES DAILY  . ipratropium-albuterol (DUONEB) 0.5-2.5 (3) MG/3ML SOLN TAKE 3 MLS BY NEBULIZATION EVERY 6 (SIX) HOURS AS NEEDED.  . methadone (DOLOPHINE) 10 MG/ML solution Take 110 mg by mouth daily.  . nicotine (NICODERM CQ - DOSED IN MG/24 HOURS) 14 mg/24hr patch Place 1 patch (14 mg total) onto the skin daily.  . Nutritional Supplements (FEEDING SUPPLEMENT, OSMOLITE 1.5 CAL,) LIQD Give 410 ml 4 times daily via feeding tube (7 cartons daily  needed)  . Nutritional Supplements (PROMOD) LIQD Give 30 mL Promod twice daily via PEG.  . PERFOROMIST 20 MCG/2ML nebulizer solution INHALE CONTENTS OF 1 VIAL VIA NEBULIZER 2 TIMES DAILY  . sucralfate (CARAFATE) 1 GM/10ML suspension TAKE 10 MLS (1 G TOTAL) BY MOUTH 2 (TWO) TIMES DAILY.  Marland Kitchen Water For Irrigation, Sterile (FREE WATER) SOLN Place 300 mLs into feeding tube every 8 (eight) hours.  . [DISCONTINUED] amoxicillin-clavulanate (AUGMENTIN) 875-125 MG tablet Take 1 tablet by mouth 2 (two) times daily.   No facility-administered encounter medications on file as of 06/18/2019.   Physical Exam: Blood pressure 114/76, pulse 67, temperature (!) 97.4 F (36.3 C), temperature source Temporal, height 6' (1.829 m), weight 131 lb (59.4 kg), SpO2 (!) 65 %. Gen:      Frail, malnourished HEENT:  EOMI, sclera anicteric Neck:     No masses; no thyromegaly Lungs:    Clear to auscultation bilaterally; normal respiratory effort CV:         Regular rate and rhythm; no murmurs Abd:      + bowel sounds; soft, non-tender; no palpable masses, no distension Ext:    No edema; adequate peripheral perfusion Skin:      Warm and dry; no rash Neuro: alert and oriented x 3 Psych: normal mood and affect  Data Reviewed: Imaging: CT chest 07/03/2018-mild centrilobular emphysema, fine nodularity in the lower lobes, middle lobe and lingula.  1.1 irregular elongated nodule in the left lower lobe, 1 cm pulmonary nodule in the right middle lobe.  I have reviewed  the images personally.   PET scan 07/17/2018- no significant PET uptake in the right middle lobe and left lower lobe nodule.  Hypermetabolism in the left vocal cord.  Emphysema and bronchial wall thickening  CT chest 10/16/2018-Tree in bud opacity, bronchial wall thickening with mucous plugging.  Lung nodules are stable.    CT chest 04/26/2019-9.2 cm masslike opacity in the right upper and middle lobes with central necrosis.  Additional patchy opacities in the left  upper lobe, lingula and lower lobes.  Emphysema.  I have reviewed the images.  FENO 07/06/2018-11   Labs CBC 11/02/2018-WBC 7.3, eos 2% and absolute eosinophil count 146 IgE 07/06/2018- 106 Alpha-1 antitrypsin 07/06/2018-199, PIMM  Assessment:  Abnormal CT CT reviewed with new masslike consolidative changes in the right lung and scattered groundglass opacities, consolidation consistent with recurrent aspiration pneumonia. We will treat him with Augmentin for 14 days and follow-up scan in 4 to 8 weeks to make sure that opacity is improving and there is no underlying malignancy.  We will touch base with his speech pathologist to make sure he is being evaluated for ongoing aspiration  Chronic bronchitis, emphysema Likely has COPD based on his presentation and smoking history. Hold off on PFTs due to COVID restrictions and also he has a trach in place.  Continue Pulmicort, performist nebs.  Active smoker Smoking cessation strongly encouraged.  Prescribe nicotine patches Time spent counseling- 5 mins.  Reassess at return visit  Plan/Recommendations: - Continue performist, Pulmicort - Augmentin for 14 days - Follow-up CT - Nicotine patches for smoking cessation  Marshell Garfinkel MD [facility] 06/18/2019, 2:27 PM  CC: [name], MD   Addendum: Received note from ENT, Dr. Benjamine Mola dated 05/31/2019 Tracheostomy was decannulated in the office without any issue.  Marshell Garfinkel MD [facility] 06/18/2019, 2:27 PM

## 2019-06-18 NOTE — Progress Notes (Signed)
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  Had a PEG tube placement on 07/24/2024. Follows with Dr. Benjamine Mola for trach and Dr. 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  Continues to smoke a pack a day Travel history: No significant travel history Relevant family history: No significant family history of lung disease  Interim history: CT scan at last visit showed aspiration pneumonia with consolidation in the right middle lobe He received 14 days of Augmentin.  States that his cough is better with clear mucus now Continues to smoke  Received note from ENT, Dr. Benjamine Mola dated 05/31/2019 Tracheostomy was decannulated in the office without any issue.  Outpatient Encounter Medications as of 06/18/2019  Medication Sig  . albuterol (PROVENTIL HFA;VENTOLIN HFA) 108 (90 Base) MCG/ACT inhaler Inhale 1-2 puffs into the lungs every 6 (six) hours as needed for wheezing.   . budesonide (PULMICORT) 0.5 MG/2ML nebulizer solution INHALE CONTENTS OF 1 VIAL VIA NEBULIZER 2 TIMES DAILY  . ipratropium-albuterol (DUONEB) 0.5-2.5 (3) MG/3ML SOLN TAKE 3 MLS BY NEBULIZATION EVERY 6 (SIX) HOURS AS NEEDED.  . methadone (DOLOPHINE) 10 MG/ML solution Take 110 mg by mouth daily.  . nicotine (NICODERM CQ - DOSED IN MG/24 HOURS) 14 mg/24hr patch Place 1 patch (14 mg total)  onto the skin daily.  . Nutritional Supplements (FEEDING SUPPLEMENT, OSMOLITE 1.5 CAL,) LIQD Give 410 ml 4 times daily via feeding tube (7 cartons daily needed)  . Nutritional Supplements (PROMOD) LIQD Give 30 mL Promod twice daily via PEG.  . PERFOROMIST 20 MCG/2ML nebulizer solution INHALE CONTENTS OF 1 VIAL VIA NEBULIZER 2 TIMES DAILY  . sucralfate (CARAFATE) 1 GM/10ML suspension TAKE 10 MLS (1 G TOTAL) BY MOUTH 2 (TWO) TIMES DAILY.  Marland Kitchen Water For Irrigation, Sterile (FREE WATER) SOLN Place 300 mLs into feeding tube every 8 (eight) hours.  . [DISCONTINUED] amoxicillin-clavulanate (AUGMENTIN) 875-125 MG tablet Take 1 tablet by mouth 2 (two) times daily.   No facility-administered encounter medications on file as of 06/18/2019.   Physical Exam: Gen:      No acute distress, frail, malnourished HEENT:  EOMI, sclera anicteric Neck:     No masses; no thyromegaly, open tracheostomy aperture Lungs:    Clear to auscultation bilaterally; normal respiratory effort CV:         Regular rate and rhythm; no murmurs Abd:      + bowel sounds; soft, non-tender; no palpable masses, no distension Ext:    No edema; adequate peripheral perfusion Skin:      Warm and dry; no rash Neuro: alert and oriented x 3 Psych: normal mood and affect  Data Reviewed: Imaging: CT chest 07/03/2018-mild centrilobular emphysema, fine nodularity in the lower lobes, middle lobe and lingula.  1.1 irregular elongated nodule in the left lower lobe, 1 cm pulmonary nodule in the right middle lobe.  I have reviewed the  images personally.  PET scan 07/17/2018- no significant PET uptake in the right middle lobe and left lower lobe nodule.  Hypermetabolism in the left vocal cord.  Emphysema and bronchial wall thickening  CT chest 10/16/2018-Tree in bud opacity, bronchial wall thickening with mucous plugging.  Lung nodules are stable.    CT chest 04/26/2019-9.2 cm masslike opacity in the right upper and middle lobes with central necrosis.   Additional patchy opacities in the left upper lobe, lingula and lower lobes.  Emphysema.    CT scan 06/10/2019-improvement in right middle lobe opacity with residual cavitation, worsened right lower lobe opacity. I have reviewed the images personally  FENO 07/06/2018-11   Labs CBC 11/02/2018-WBC 7.3, eos 2% and absolute eosinophil count 146 IgE 07/06/2018- 106 Alpha-1 antitrypsin 07/06/2018-199, PIMM  Assessment:  Recurrent aspiration His CT showed migrating areas of consolidation consistent with ongoing aspiration He has already received 14 days of Augmentin in dec for right middle lobe opacity with follow up CT showing residual cavitation and new opacities. Will avoid further antibiotics as clinically as he does not appear toxic or infected Hopefully with the trach out his aspiration would reduce Continue follow-up with speech pathology  Follow-up CT in 6 months  Chronic bronchitis, emphysema Likely has COPD based on his presentation and smoking history. Hold off on PFTs as he still has an open trach aperture  Continue Pulmicort, performist nebs.  Active smoker Smoking cessation strongly encouraged.  Prescribe nicotine patches Time spent counseling- 5 mins.  Reassess at return visit  Plan/Recommendations: - Continue performist, Pulmicort - Follow-up CT - Nicotine patches for smoking cessation  Marshell Garfinkel MD [facility] 06/18/2019, 2:29 PM  CC: [name], MD

## 2019-06-20 ENCOUNTER — Inpatient Hospital Stay: Payer: BC Managed Care – PPO

## 2019-06-20 ENCOUNTER — Inpatient Hospital Stay: Payer: BC Managed Care – PPO | Attending: Hematology | Admitting: Hematology

## 2019-06-21 ENCOUNTER — Telehealth: Payer: Self-pay | Admitting: Hematology

## 2019-06-21 NOTE — Telephone Encounter (Signed)
Rescheduled per 1/28 sch msg, pt req. Called and left a msg. Mailing printout

## 2019-06-24 ENCOUNTER — Telehealth: Payer: Self-pay | Admitting: *Deleted

## 2019-06-24 NOTE — Telephone Encounter (Addendum)
Oncology Nurse Navigator Documentation  Spoke with Jarrett Soho, Acute Rehab Services, re scheduling Mr. Blickenstaff for MBSS.  She indicated she will give him a call.  I LVMM for him to inform, asked him to call me to confirm message receipt.  Unable to LVMM for daughter Amburn.  Gayleen Orem, RN, BSN Head & Neck Oncology Nurse [place] at [place] [phone]

## 2019-06-25 ENCOUNTER — Telehealth: Payer: Self-pay | Admitting: *Deleted

## 2019-06-25 ENCOUNTER — Inpatient Hospital Stay: Payer: BC Managed Care – PPO | Attending: Hematology

## 2019-06-25 NOTE — Progress Notes (Signed)
Nutrition Follow-up:  Patient with cancer of the larynx, followed by Dr. Irene Limbo.  Patient has completed chemotherapy and radiation therapy on 10/16/18.    Spoke with patient via phone for nutrition follow-up.  Patient reports that he has mostly been giving 6 cartons of osmolite 1.5 via feeding tube (2 cartons TID) sometimes will give 7 (1 carton at 4th feeding).  Flushing tube with 12 oz of water TID with feeding.  Giving 2 oz of prosource (protein modular) with 6 oz of water.    Patient reports that ENT "took tube out".  Noted trach decannulated on 05/31/19.    MBSS to be scheduled hopefully today.   Patient reports bowel movement about q 3-4 days. "I have never been regular due to being on methadone."  Medications: reviewed  Labs: no new  Anthropometrics:   Noted 06/18/19 128 lb   Estimated Energy Needs  Kcals: 1800-2100 plus 500 calories, wt gain) 2300-2600 Protein: 115-130 g Fluid: > 2 L  NUTRITION DIAGNOSIS: Unintentional weight loss stable   INTERVENTION:  Patient to continue 6-7 cartons of osmolite 1.5 daily (7 would provide more calories for weight gain).  Continue water flush of 12 oz TID with feeding.  Continue prosource 2 oz (41ml) daily with water flush of 6 oz Tube feeding provides 2330-2685 calories, 109-124 g, 2340-2475ml free water.  Encouraged patient to get MBSS scheduled and completed Patient will contact [facility] for enteral supplies as needed.  Patient has contact information    MONITORING, EVALUATION, GOAL: Patient will tolerate tube feeding to prevent further weight loss   NEXT VISIT: phone f/u March 2  Ronald Ruiz B. Ronald Ruiz, [place], [place] Registered Dietitian [phone] (pager)

## 2019-06-25 NOTE — Telephone Encounter (Signed)
Oncology Nurse Navigator Documentation  [name] returned my call from yesterday.  I explained the importance of MBSS ordered by Dr. Irene Limbo.  He voiced understanding, reluctantly agreed to schedule when called by Acute Rehab Services.   Gayleen Orem, RN, BSN Head & Neck Oncology Nurse [place] [facility] [place] [phone]

## 2019-06-26 ENCOUNTER — Encounter (HOSPITAL_COMMUNITY): Payer: Self-pay | Admitting: Emergency Medicine

## 2019-06-26 ENCOUNTER — Emergency Department (HOSPITAL_COMMUNITY)
Admission: EM | Admit: 2019-06-26 | Discharge: 2019-06-26 | Disposition: A | Discharge status: Home / Self Care | Payer: Self-pay | Attending: Emergency Medicine | Admitting: Emergency Medicine

## 2019-06-26 ENCOUNTER — Other Ambulatory Visit: Payer: Self-pay

## 2019-06-26 DIAGNOSIS — F1193 Opioid use, unspecified with withdrawal: Secondary | ICD-10-CM

## 2019-06-26 DIAGNOSIS — F1123 Opioid dependence with withdrawal: Secondary | ICD-10-CM | POA: Insufficient documentation

## 2019-06-26 DIAGNOSIS — Z79899 Other long term (current) drug therapy: Secondary | ICD-10-CM | POA: Insufficient documentation

## 2019-06-26 DIAGNOSIS — Z87891 Personal history of nicotine dependence: Secondary | ICD-10-CM | POA: Insufficient documentation

## 2019-06-26 DIAGNOSIS — G4701 Insomnia due to medical condition: Secondary | ICD-10-CM | POA: Insufficient documentation

## 2019-06-26 DIAGNOSIS — J449 Chronic obstructive pulmonary disease, unspecified: Secondary | ICD-10-CM | POA: Insufficient documentation

## 2019-06-26 MED ORDER — DIAZEPAM 1 MG/ML PO SOLN: 5.0000 mg | Freq: Every evening | ORAL | 0 refills | Status: DC | PRN

## 2019-06-26 NOTE — ED Triage Notes (Signed)
Pt states that he is unable to sleep since Saturday.  Pt is in remission for throat cancer.  Pt had a trach removed 2 weeks ago.  States he has a feeding tube and spilled some of his methadone so he is out.

## 2019-06-26 NOTE — ED Provider Notes (Signed)
[facility] DEPT Provider Note: Georgena Spurling, MD, FACEP  CSN: [id number] MRN: [id number] ARRIVAL: 06/26/19 at [place]: [place]  Insomnia   HISTORY OF PRESENT ILLNESS  06/26/19 4:12 AM Ronald Ruiz is a 64 y.o. male being treated for throat cancer who recently had his tracheostomy tube removed.  He is unable to eat or drink and so relies on a gastric tube for nutrition, hydration and medications.  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  He has been out for 4 days and has not slept well for those 4 days.  He states he is constantly being awakened by tremors in his feet.  He denies being in pain.  He has been taking NyQuil and Tylenol PM without adequate relief.   Past Medical History:  Diagnosis Date  . Bronchitis 03/2018  . COPD (chronic obstructive pulmonary disease) ([place])   . Herpes   . laryngeal ca dx'd 06/2018  . Lung nodule 2020  . Substance abuse ([employer])    opiate addiction, been on methadone for 11 years    Past Surgical History:  Procedure Laterality Date  . DIRECT LARYNGOSCOPY N/A 07/21/2018   Procedure: DIRECT LARYNGOSCOPY, TRACHEOSTOMY, BIOPSY;  Surgeon: Leta Baptist, MD;  Location: [facility];  Service: ENT;  Laterality: N/A;  . IR GASTROSTOMY TUBE MOD SED  08/02/2018    Family History  Problem Relation Age of Onset  . Alcohol abuse Father   . Cancer Father   . Cirrhosis Father   . Drug abuse Brother     Social History   Tobacco Use  . Smoking status: Former Smoker    Packs/day: 2.00    Years: 50.00    Pack years: 100.00    Types: Cigarettes  . Smokeless tobacco: Never Used  . Tobacco comment: 07/06/18 at 10 cigs per day, quit 06/16/18  Substance Use Topics  . Alcohol use: Not Currently  . Drug use: Not Currently    Comment: hx of opiod abuse    Prior to Admission medications   Medication Sig Start Date End Date Taking? Authorizing  Provider  albuterol (PROVENTIL HFA;VENTOLIN HFA) 108 (90 Base) MCG/ACT inhaler Inhale 1-2 puffs into the lungs every 6 (six) hours as needed for wheezing.  03/30/18 06/18/19  [provider]  budesonide (PULMICORT) 0.5 MG/2ML nebulizer solution INHALE CONTENTS OF 1 VIAL VIA NEBULIZER 2 TIMES DAILY 10/02/18   Mannam, Praveen, MD  diazepam (VALIUM) 1 MG/ML solution Place 5-10 mLs (5-10 mg total) into feeding tube at bedtime as needed (for insomnia; do not take with alcohol or methadone). 06/26/19   Jonquil Stubbe, MD  ipratropium-albuterol (DUONEB) 0.5-2.5 (3) MG/3ML SOLN TAKE 3 MLS BY NEBULIZATION EVERY 6 (SIX) HOURS AS NEEDED. 10/02/18   Mannam, Hart Robinsons, MD  methadone (DOLOPHINE) 10 MG/ML solution Take 110 mg by mouth daily.    [provider]  nicotine (NICODERM CQ - DOSED IN MG/24 HOURS) 14 mg/24hr patch Place 1 patch (14 mg total) onto the skin daily. 05/10/19   Mannam, Hart Robinsons, MD  Nutritional Supplements (FEEDING SUPPLEMENT, OSMOLITE 1.5 CAL,) LIQD Give 410 ml 4 times daily via feeding tube (7 cartons daily needed) 09/25/18   [name], MD  Nutritional Supplements (PROMOD) LIQD Give 30 mL Promod twice daily via PEG. 08/22/18   [name], MD  PERFOROMIST 20 MCG/2ML nebulizer solution INHALE CONTENTS OF 1 VIAL VIA NEBULIZER 2  TIMES DAILY 10/02/18   Mannam, Praveen, MD  sucralfate (CARAFATE) 1 GM/10ML suspension TAKE 10 MLS (1 G TOTAL) BY MOUTH 2 (TWO) TIMES DAILY. 09/24/18   [name], MD  Water For Irrigation, Sterile (FREE WATER) SOLN Place 300 mLs into feeding tube every 8 (eight) hours. 08/03/18   Kerney Elbe, DO    Allergies Patient has no known allergies.   REVIEW OF SYSTEMS  Negative except as noted here or in the History of Present Illness.   PHYSICAL EXAMINATION  Initial Vital Signs Blood pressure (!) 155/81, pulse 90, temperature 97.9 F (36.6 C), temperature source Oral, resp. rate 18, height 6' (1.829 m), weight 54.4 kg, SpO2 100  %.  Examination General: Well-developed, cachectic male in no acute distress; appearance consistent with age of record HENT: normocephalic; atraumatic Eyes: pupils equal, round and reactive to light; extraocular muscles intact Neck: supple; healing tracheostomy Heart: regular rate and rhythm Lungs: clear to auscultation bilaterally Abdomen: soft; nondistended; nontender; bowel sounds present Extremities: Arthritic changes of hands Neurologic: Awake, alert and oriented; motor function intact in all extremities and symmetric; no facial droop Skin: Warm and dry Psychiatric: Normal mood and affect   RESULTS  Summary of this visit's results, reviewed and interpreted by myself:   EKG Interpretation  Date/Time:    Ventricular Rate:    PR Interval:    QRS Duration:   QT Interval:    QTC Calculation:   R Axis:     Text Interpretation:        Laboratory Studies: No results found for this or any previous visit (from the past 24 hour(s)). Imaging Studies: No results found.  ED COURSE and MDM  Nursing notes, initial and subsequent vitals signs, including pulse oximetry, reviewed and interpreted by myself.  Vitals:   06/26/19 0256 06/26/19 0257  BP: (!) 155/81   Pulse: 90   Resp: 18   Temp: 97.9 F (36.6 C)   TempSrc: Oral   SpO2: 100%   Weight:  54.4 kg  Height:  6' (1.829 m)   Medications - No data to display  Patient advised it is illegal for me to write for methadone for treatment of opioid dependence.  It is also illegal and against hospital policy to write for any long-acting narcotic, especially for the treatment of opioid dependence.  We will trial a benzodiazepine and encouraged him to follow-up with his methadone provider as soon as possible.  PROCEDURES  Procedures   ED DIAGNOSES     ICD-10-CM   1. Insomnia due to medical condition  G47.01   2. Methadone withdrawal without complication ([address])  [id number]        Jakyria Bleau, Jenny Reichmann, MD 06/26/19 [phone]

## 2019-07-02 ENCOUNTER — Encounter (HOSPITAL_COMMUNITY): Payer: Self-pay | Admitting: Emergency Medicine

## 2019-07-02 ENCOUNTER — Emergency Department (HOSPITAL_COMMUNITY): Payer: Self-pay

## 2019-07-02 ENCOUNTER — Other Ambulatory Visit: Payer: Self-pay

## 2019-07-02 ENCOUNTER — Inpatient Hospital Stay (HOSPITAL_COMMUNITY)
Admission: EM | Admit: 2019-07-02 | Discharge: 2019-07-04 | DRG: 682 | Disposition: A | Discharge status: Home / Self Care | Payer: Self-pay | Attending: Family Medicine | Admitting: Family Medicine

## 2019-07-02 DIAGNOSIS — T17908A Unspecified foreign body in respiratory tract, part unspecified causing other injury, initial encounter: Secondary | ICD-10-CM | POA: Diagnosis present

## 2019-07-02 DIAGNOSIS — F1120 Opioid dependence, uncomplicated: Secondary | ICD-10-CM | POA: Diagnosis present

## 2019-07-02 DIAGNOSIS — Z813 Family history of other psychoactive substance abuse and dependence: Secondary | ICD-10-CM

## 2019-07-02 DIAGNOSIS — Z79899 Other long term (current) drug therapy: Secondary | ICD-10-CM

## 2019-07-02 DIAGNOSIS — W19XXXA Unspecified fall, initial encounter: Secondary | ICD-10-CM | POA: Diagnosis present

## 2019-07-02 DIAGNOSIS — J449 Chronic obstructive pulmonary disease, unspecified: Secondary | ICD-10-CM | POA: Diagnosis present

## 2019-07-02 DIAGNOSIS — E43 Unspecified severe protein-calorie malnutrition: Secondary | ICD-10-CM | POA: Diagnosis present

## 2019-07-02 DIAGNOSIS — Z85819 Personal history of malignant neoplasm of unspecified site of lip, oral cavity, and pharynx: Secondary | ICD-10-CM | POA: Diagnosis present

## 2019-07-02 DIAGNOSIS — Z87891 Personal history of nicotine dependence: Secondary | ICD-10-CM

## 2019-07-02 DIAGNOSIS — R296 Repeated falls: Secondary | ICD-10-CM | POA: Diagnosis present

## 2019-07-02 DIAGNOSIS — Z681 Body mass index (BMI) 19 or less, adult: Secondary | ICD-10-CM

## 2019-07-02 DIAGNOSIS — R531 Weakness: Secondary | ICD-10-CM

## 2019-07-02 DIAGNOSIS — Z8521 Personal history of malignant neoplasm of larynx: Secondary | ICD-10-CM

## 2019-07-02 DIAGNOSIS — R7401 Elevation of levels of liver transaminase levels: Secondary | ICD-10-CM | POA: Diagnosis present

## 2019-07-02 DIAGNOSIS — J439 Emphysema, unspecified: Secondary | ICD-10-CM | POA: Diagnosis present

## 2019-07-02 DIAGNOSIS — E860 Dehydration: Secondary | ICD-10-CM

## 2019-07-02 DIAGNOSIS — Z811 Family history of alcohol abuse and dependence: Secondary | ICD-10-CM

## 2019-07-02 DIAGNOSIS — D649 Anemia, unspecified: Secondary | ICD-10-CM | POA: Diagnosis present

## 2019-07-02 DIAGNOSIS — N179 Acute kidney failure, unspecified: Principal | ICD-10-CM

## 2019-07-02 DIAGNOSIS — E870 Hyperosmolality and hypernatremia: Secondary | ICD-10-CM | POA: Diagnosis present

## 2019-07-02 DIAGNOSIS — Z20822 Contact with and (suspected) exposure to covid-19: Secondary | ICD-10-CM | POA: Diagnosis present

## 2019-07-02 DIAGNOSIS — Z923 Personal history of irradiation: Secondary | ICD-10-CM

## 2019-07-02 DIAGNOSIS — Z7951 Long term (current) use of inhaled steroids: Secondary | ICD-10-CM

## 2019-07-02 LAB — URINALYSIS, ROUTINE W REFLEX MICROSCOPIC
Bilirubin Urine: NEGATIVE
Glucose, UA: NEGATIVE mg/dL
Hgb urine dipstick: NEGATIVE
Ketones, ur: NEGATIVE mg/dL
Nitrite: NEGATIVE
Protein, ur: NEGATIVE mg/dL
Specific Gravity, Urine: 1.014 (ref 1.005–1.030)
pH: 5 (ref 5.0–8.0)

## 2019-07-02 LAB — COMPREHENSIVE METABOLIC PANEL WITH GFR
ALT: 108 U/L — ABNORMAL HIGH (ref 0–44)
AST: 142 U/L — ABNORMAL HIGH (ref 15–41)
Albumin: 3.3 g/dL — ABNORMAL LOW (ref 3.5–5.0)
Alkaline Phosphatase: 72 U/L (ref 38–126)
Anion gap: 10 (ref 5–15)
BUN: 95 mg/dL — ABNORMAL HIGH (ref 8–23)
CO2: 27 mmol/L (ref 22–32)
Calcium: 9.2 mg/dL (ref 8.9–10.3)
Chloride: 107 mmol/L (ref 98–111)
Creatinine, Ser: 2.34 mg/dL — ABNORMAL HIGH (ref 0.61–1.24)
GFR calc Af Amer: 33 mL/min — ABNORMAL LOW (ref >60)
GFR calc non Af Amer: 29 mL/min — ABNORMAL LOW (ref >60)
Glucose, Bld: 212 mg/dL — ABNORMAL HIGH (ref 70–99)
Potassium: 4.4 mmol/L (ref 3.5–5.1)
Sodium: 144 mmol/L (ref 135–145)
Total Bilirubin: 0.5 mg/dL (ref 0.3–1.2)
Total Protein: 7.6 g/dL (ref 6.5–8.1)

## 2019-07-02 LAB — CBC WITH DIFFERENTIAL/PLATELET
Abs Immature Granulocytes: 0.02 10*3/uL (ref 0.00–0.07)
Basophils Absolute: 0 10*3/uL (ref 0.0–0.1)
Basophils Relative: 0 %
Eosinophils Absolute: 0 10*3/uL (ref 0.0–0.5)
Eosinophils Relative: 1 %
HCT: 35.4 % — ABNORMAL LOW (ref 39.0–52.0)
Hemoglobin: 11.2 g/dL — ABNORMAL LOW (ref 13.0–17.0)
Immature Granulocytes: 0 %
Lymphocytes Relative: 6 %
Lymphs Abs: 0.4 10*3/uL — ABNORMAL LOW (ref 0.7–4.0)
MCH: 31.3 pg (ref 26.0–34.0)
MCHC: 31.6 g/dL (ref 30.0–36.0)
MCV: 98.9 fL (ref 80.0–100.0)
Monocytes Absolute: 0.5 10*3/uL (ref 0.1–1.0)
Monocytes Relative: 8 %
Neutro Abs: 5.8 10*3/uL (ref 1.7–7.7)
Neutrophils Relative %: 85 %
Platelets: 145 10*3/uL — ABNORMAL LOW (ref 150–400)
RBC: 3.58 MIL/uL — ABNORMAL LOW (ref 4.22–5.81)
RDW: 19.5 % — ABNORMAL HIGH (ref 11.5–15.5)
WBC: 6.7 10*3/uL (ref 4.0–10.5)
nRBC: 0 % (ref 0.0–0.2)

## 2019-07-02 LAB — LACTIC ACID, PLASMA: Lactic Acid, Venous: 0.9 mmol/L (ref 0.5–1.9)

## 2019-07-02 LAB — RESPIRATORY PANEL BY RT PCR (FLU A&B, COVID)
Influenza A by PCR: NEGATIVE
Influenza B by PCR: NEGATIVE
SARS Coronavirus 2 by RT PCR: NEGATIVE

## 2019-07-02 MED ORDER — OSMOLITE 1.5 CAL PO LIQD
410.0000 mL | Freq: Four times a day (QID) | ORAL | Status: DC
  Administered 2019-07-02 – 2019-07-04 (×8): 410 mL
  Filled 2019-07-02 (×10): qty 474

## 2019-07-02 MED ORDER — ONDANSETRON HCL 4 MG/2ML IJ SOLN: 4.0000 mg | Freq: Four times a day (QID) | INTRAMUSCULAR | Status: DC | PRN

## 2019-07-02 MED ORDER — IPRATROPIUM-ALBUTEROL 0.5-2.5 (3) MG/3ML IN SOLN: 3.0000 mL | Freq: Four times a day (QID) | RESPIRATORY_TRACT | Status: DC | PRN

## 2019-07-02 MED ORDER — FREE WATER
300.0000 mL | Freq: Four times a day (QID) | Status: DC
  Administered 2019-07-02 – 2019-07-04 (×8): 300 mL

## 2019-07-02 MED ORDER — ONDANSETRON HCL 4 MG PO TABS: 4.0000 mg | ORAL_TABLET | Freq: Four times a day (QID) | ORAL | Status: DC | PRN

## 2019-07-02 MED ORDER — ENOXAPARIN SODIUM 30 MG/0.3ML ~~LOC~~ SOLN
30.0000 mg | Freq: Every day | SUBCUTANEOUS | Status: DC
  Administered 2019-07-02 – 2019-07-03 (×2): 30 mg via SUBCUTANEOUS
  Filled 2019-07-02 (×2): qty 0.3

## 2019-07-02 MED ORDER — SODIUM CHLORIDE 0.9 % IV SOLN: INTRAVENOUS | Status: DC

## 2019-07-02 MED ORDER — DIAZEPAM 1 MG/ML PO SOLN: 5.0000 mg | Freq: Every evening | ORAL | Status: DC | PRN

## 2019-07-02 MED ORDER — BUDESONIDE 0.5 MG/2ML IN SUSP: 0.5000 mg | Freq: Two times a day (BID) | RESPIRATORY_TRACT | Status: DC | PRN

## 2019-07-02 MED ORDER — ACETAMINOPHEN 325 MG PO TABS: 650.0000 mg | ORAL_TABLET | Freq: Four times a day (QID) | ORAL | Status: DC | PRN

## 2019-07-02 MED ORDER — LACTATED RINGERS IV BOLUS
1000.0000 mL | Freq: Once | INTRAVENOUS | Status: AC
  Administered 2019-07-02: 09:00:00 1000 mL via INTRAVENOUS

## 2019-07-02 MED ORDER — LACTATED RINGERS IV BOLUS
1000.0000 mL | Freq: Once | INTRAVENOUS | Status: AC
  Administered 2019-07-02: 12:00:00 1000 mL via INTRAVENOUS

## 2019-07-02 MED ORDER — METHADONE HCL 10 MG/ML PO CONC
110.0000 mg | Freq: Every day | ORAL | Status: DC
  Administered 2019-07-02: 110 mg via ORAL
  Filled 2019-07-02: qty 11

## 2019-07-02 MED ORDER — ACETAMINOPHEN 650 MG RE SUPP: 650.0000 mg | Freq: Four times a day (QID) | RECTAL | Status: DC | PRN

## 2019-07-02 NOTE — ED Notes (Signed)
RN made MD aware of patient's daughter's concerned regarding his pneumonia and her request to be called when lab work comes back to discuss plan of care.

## 2019-07-02 NOTE — ED Notes (Signed)
ED Provider at bedside. 

## 2019-07-02 NOTE — ED Notes (Signed)
RN attempted to contact patient's daughter regarding plan of care, pt's daughter did not answer.

## 2019-07-02 NOTE — H&P (Signed)
History and Physical    Ronald Ruiz [id number] DOB: [birth year] DOA: 07/02/2019  PCP: Ronald Fraise, MD  Patient coming from: Home.  I have personally briefly reviewed patient's old medical records in [place]  Chief Complaint: Fall and weakness.  HPI: Ronald Ruiz is a 64 y.o. 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  His tracheostomy was removed Friday.  For the past 3 days he has been having weakness with multiple falls where he tries to stand up, feels his legs becoming very weak and falls back down to his bed.  He denies having LOC or any trauma associated with his falls.  No fever, chills, headache, chest pain, worsening dyspnea, abdominal pain, nausea, vomiting, change in bowel habits, melena or hematochezia.  He denies changes in hearing color, dysuria, frequency, hematuria or oliguria.  No polyuria, polydipsia, polyphagia or blurred vision.  ED Course: Initial vital signs were temperature 97.6 F, pulse 74, respirations 17, BP 133/66 mmHg and O2 sat 100% on room air.  The patient was given 2000 mL of LR bolus.  White count was 6.7, hemoglobin 11.2 g/dL and platelets 145.  His lactic acid was normal.  CMP shows normal electrolytes.  BUN was 95, creatinine 2.34 and glucose 212 mg/dL.  Albumin was 3.3 g/dL (previous measurements usually below 3.0 g/dL) and AST is 142 and ALT 108 units/L, which are usually normal at baseline.  Imaging: His chest radiograph shows continued consolidation in the right midlung with some indistinct nodularity in the left midlung.  Similar to those finding on recent chest CT on 06/10/2019.  There is by apical scaring.  There is  emphysema.  Review of Systems: As per HPI otherwise 10 point review of systems negative.   Past Medical History:  Diagnosis Date  . Bronchitis 03/2018  . COPD (chronic obstructive pulmonary disease) ([place])   . Herpes   . laryngeal ca dx'd 06/2018  . Lung nodule 2020  . Substance abuse ([place])    opiate addiction, been on methadone for 11 years    Past Surgical History:  Procedure Laterality Date  . DIRECT LARYNGOSCOPY N/A 07/21/2018   Procedure: DIRECT LARYNGOSCOPY, TRACHEOSTOMY, BIOPSY;  Surgeon: Leta Baptist, MD;  Location: [facility];  Service: ENT;  Laterality: N/A;  . IR GASTROSTOMY TUBE MOD SED  08/02/2018     reports that he has quit smoking. His smoking use included cigarettes. He has a 100.00 pack-year smoking history. He has never used smokeless tobacco. He reports previous alcohol use. He reports previous drug use.  No Known Allergies  Family History  Problem Relation Age of Onset  . Alcohol abuse Father   . Cancer Father   . Cirrhosis Father   . Drug abuse Brother    Prior to Admission medications   Medication Sig Start Date End Date Taking? Authorizing Provider  albuterol (PROVENTIL HFA;VENTOLIN HFA) 108 (90 Base) MCG/ACT inhaler Inhale 1-2 puffs into the lungs every 6 (six) hours as needed for wheezing.  03/30/18 06/18/19  [provider]  budesonide (PULMICORT) 0.5 MG/2ML nebulizer solution INHALE CONTENTS OF 1 VIAL VIA NEBULIZER 2 TIMES DAILY 10/02/18   Mannam, Praveen, MD  diazepam (VALIUM) 1 MG/ML solution Place 5-10 mLs (5-10 mg total) into feeding tube at bedtime  as needed (for insomnia; do not take with alcohol or methadone). 06/26/19   Molpus, John, MD  ipratropium-albuterol (DUONEB) 0.5-2.5 (3) MG/3ML SOLN TAKE 3 MLS BY NEBULIZATION EVERY 6 (SIX) HOURS AS NEEDED. 10/02/18   Mannam, Hart Robinsons, MD  methadone (DOLOPHINE) 10 MG/ML solution Take 110 mg by mouth daily.    [provider]  nicotine (NICODERM CQ - DOSED IN MG/24 HOURS) 14 mg/24hr patch Place 1 patch  (14 mg total) onto the skin daily. 05/10/19   Mannam, Hart Robinsons, MD  Nutritional Supplements (FEEDING SUPPLEMENT, OSMOLITE 1.5 CAL,) LIQD Give 410 ml 4 times daily via feeding tube (7 cartons daily needed) 09/25/18   [name], MD  Nutritional Supplements (PROMOD) LIQD Give 30 mL Promod twice daily via PEG. 08/22/18   [name], MD  PERFOROMIST 20 MCG/2ML nebulizer solution INHALE CONTENTS OF 1 VIAL VIA NEBULIZER 2 TIMES DAILY 10/02/18   Mannam, Praveen, MD  sucralfate (CARAFATE) 1 GM/10ML suspension TAKE 10 MLS (1 G TOTAL) BY MOUTH 2 (TWO) TIMES DAILY. 09/24/18   [name], MD  Water For Irrigation, Sterile (FREE WATER) SOLN Place 300 mLs into feeding tube every 8 (eight) hours. 08/03/18   Kerney Elbe, DO    Physical Exam: Vitals:   07/02/19 0750 07/02/19 0754  BP: 133/66   Pulse: 74   Resp: 17   Temp: 97.6 F (36.4 C)   TempSrc: Oral   SpO2: 100% 98%    Constitutional: NAD, calm, comfortable Eyes: PERRL, lids and conjunctivae normal ENMT: Mucous membranes are very dry.  Posterior pharynx clear of any exudate or lesions. Neck: Tracheostomy stoma with dressing in place and some secretions noticed, normal, supple, no masses, no thyromegaly Respiratory: Clear to auscultation bilaterally, no wheezing, no crackles. Normal respiratory effort. No accessory muscle use.  Cardiovascular: Bradycardic at 58 bpm, no murmurs / rubs / gallops. No extremity edema. 2+ pedal pulses. No carotid bruits.  Abdomen: Nondistended.  PEG tube in place.  BS positive.  Soft, no tenderness, no masses palpated. No hepatosplenomegaly. Musculoskeletal: Generalized weakness.. Good ROM, no contractures. Normal muscle tone.  Skin: Multiple areas of ecchymosis on distally upper extremities. Neurologic: CN 2-12 grossly intact. Sensation intact, DTR normal.  Nonfocal weakness.Marland Kitchen  Psychiatric: Normal judgment and insight. Alert and oriented x 3. Normal mood.   Labs on Admission: I have  personally reviewed following labs and imaging studies  CBC: Recent Labs  Lab 07/02/19 0838  WBC 6.7  NEUTROABS 5.8  HGB 11.2*  HCT 35.4*  MCV 98.9  PLT [id number]*   Basic Metabolic Panel: Recent Labs  Lab 07/02/19 0838  NA 144  K 4.4  CL 107  CO2 27  GLUCOSE 212*  BUN 95*  CREATININE 2.34*  CALCIUM 9.2   GFR: Estimated Creatinine Clearance: 24.9 mL/min (A) (by C-G formula based on SCr of 2.34 mg/dL (H)). Liver Function Tests: Recent Labs  Lab 07/02/19 0838  AST 142*  ALT 108*  ALKPHOS 72  BILITOT 0.5  PROT 7.6  ALBUMIN 3.3*   No results for input(s): LIPASE, AMYLASE in the last 168 hours. No results for input(s): AMMONIA in the last 168 hours. Coagulation Profile: No results for input(s): INR, PROTIME in the last 168 hours. Cardiac Enzymes: No results for input(s): CKTOTAL, CKMB, CKMBINDEX, TROPONINI in the last 168 hours. BNP (last 3 results) No results for input(s): PROBNP in the last 8760 hours. HbA1C: No results for input(s): HGBA1C in the last 72 hours. CBG: No results for input(s): GLUCAP in  the last 168 hours. Lipid Profile: No results for input(s): CHOL, HDL, LDLCALC, TRIG, CHOLHDL, LDLDIRECT in the last 72 hours. Thyroid Function Tests: No results for input(s): TSH, T4TOTAL, FREET4, T3FREE, THYROIDAB in the last 72 hours. Anemia Panel: No results for input(s): VITAMINB12, FOLATE, FERRITIN, TIBC, IRON, RETICCTPCT in the last 72 hours. Urine analysis: No results found for: COLORURINE, APPEARANCEUR, [place], [place], GLUCOSEU, HGBUR, BILIRUBINUR, KETONESUR, PROTEINUR, UROBILINOGEN, NITRITE, LEUKOCYTESUR  Radiological Exams on Admission: DG Chest Port 1 View  Result Date: 07/02/2019 CLINICAL DATA:  Weakness, fall. EXAM: PORTABLE CHEST 1 VIEW COMPARISON:  Multiple exams, including 03/20/2019 FINDINGS: Continued biapical scarring. Continued right mid lung consolidation likely involving the upper lobe and middle lobe, as shown on the CT scan of 06/10/2019.  Mild nodularity left midlung, as on prior CT. Emphysema noted. The patient is rotated to the right on today's radiograph, reducing diagnostic sensitivity and specificity. Heart size within normal limits. No blunting of the costophrenic angles. IMPRESSION: 1. Continued consolidation in the right mid lung with some indistinct nodularity in the left mid lung. These findings are similar to those on recent chest CT of 06/10/2019. 2. Biapical scarring. 3.  Emphysema (ICD10-J43.9). Electronically Signed   By: Van Clines M.D.   On: 07/02/2019 09:17    EKG: Independently reviewed. Vent. rate 58 BPM PR interval * ms QRS duration 119 ms QT/QTc 466/458 ms P-R-T axes 104 88 53 Sinus rhythm Nonspecific intraventricular conduction delay Artifact in lead(s) I III aVL  Assessment/Plan Principal Problem:   AKI (acute kidney injury) ([place]) Observation/MedSurg. Continue IV fluids NS 100 mL/h. Urinalysis still pending. Monitor intake and output. Follow renal function electrolytes.  Active Problems:   Protein-calorie malnutrition, severe Consult nutritional service. Continue current feeding supplement pending NS evaluation.    COPD (chronic obstructive pulmonary disease) ([place]) Supplemental oxygen as needed. Continue nebulized Pulmicort twice daily. Bronchodilators as needed.    Transaminitis Likely due to dehydration. Continue IVF. Follow AST/ALT in a.m. after hydration.    Normocytic anemia Likely higher than baseline due to hemoconcentration. Monitor hematocrit and hemoglobin.    Chronic pulmonary aspiration No fever, chills or worsening dyspnea. No elevated white blood count and normal lactic. Per pulmonology no need for further antibiotics. Continue to observation for now.    History of throat cancer Currently in remission. Follow-up with oncology/pulmonology as scheduled.   DVT prophylaxis: Lovenox SQ. Code Status: Full code. Family Communication: Disposition Plan:  Observation for IV hydration. Consults called: Admission status: Observation/MedSurg.   Reubin Milan MD [facility]  If 7PM-7AM, please contact night-coverage [email or web address]  07/02/2019, 10:30 AM   This document was prepared using Dragon voice recognition software and may contain some unintended transcription errors.

## 2019-07-02 NOTE — ED Triage Notes (Signed)
Patient states he had a fall this morning, states he has been weak since Tuesday. Denies LOC, hitting his head or any pain. Trach removed Friday, patient states he has throat cancer but is not getting treatment because it is in remission.

## 2019-07-02 NOTE — ED Notes (Signed)
Change gauze on trach opening and secured with paper tape.

## 2019-07-02 NOTE — ED Notes (Signed)
Patient's dressing over the previous trach opening, had mucus production on it and it was draining around the opening. RN changed dressing and used paper tape to secure so as not to irritate the skin further. Patient provided with a new sheet. RN provided oral swab to swab out patient's mouth as his mouth was dry

## 2019-07-02 NOTE — ED Notes (Signed)
Patient aware urine sample needed X-Ray at bedside

## 2019-07-02 NOTE — ED Notes (Signed)
RN spoke to patient's daughter to update about plan of care

## 2019-07-02 NOTE — ED Notes (Signed)
RN has cleaned patient's trach area x2 and applied fresh dressing. Copious mucous production at this time.

## 2019-07-02 NOTE — ED Provider Notes (Signed)
[facility] DEPT Provider Note   CSN: [id number] Arrival date & time: 07/02/19  [id number]     History No chief complaint on file.   Ronald Ruiz is a 64 y.o. male.  Patient is a 64 year old male with a history of COPD, laryngeal cancer status post radiation, surgery and recent trach takedown 2 weeks ago, ongoing tobacco abuse and underweight who is presenting today with generalized weakness.  Patient states for the last week he has been feeling more and more generally weak.  He has had multiple falls where he goes to stand up and his legs just will not hold him and he falls back down on the bed.  He has not had any injury from a fall.  He denies hitting his head or loss of consciousness.  He denies any new cough, shortness of breath or new trach secretions.  He continues to do his Osmolite feeding solution every 4 hours but states now when he stands up sometimes he will urinate on himself.  His bowel movements have been unchanged.  He still only takes food through the feeding tube and he does not eat by mouth.  He denies any alcohol use or drug use.  He does take methadone daily.  No fever or sick contacts.  No unilateral weakness or speech difficulty.  The history is provided by the patient.       Past Medical History:  Diagnosis Date  . Bronchitis 03/2018  . COPD (chronic obstructive pulmonary disease) ([place])   . Herpes   . laryngeal ca dx'd 06/2018  . Lung nodule 2020  . Substance abuse ([place])    opiate addiction, been on methadone for 11 years    Patient Active Problem List   Diagnosis Date Noted  . Aspiration into airway 06/18/2019  . Dehydration 12/03/2018  . Counseling regarding advance care planning and goals of care 08/23/2018  . COPD with chronic bronchitis ([place]) 07/31/2018  . Dysphagia 07/31/2018  . Laryngeal mass 07/31/2018  . Substance abuse ([place]) 07/31/2018  . Protein-calorie malnutrition, severe 07/23/2018  . 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 DIRECT LARYNGOSCOPY N/A 07/21/2018   Procedure: DIRECT LARYNGOSCOPY, TRACHEOSTOMY, BIOPSY;  Surgeon: Leta Baptist, MD;  Location: [facility];  Service: ENT;  Laterality: N/A;  . IR GASTROSTOMY TUBE MOD SED  08/02/2018       Family History  Problem Relation Age of Onset  . Alcohol abuse Father   . Cancer Father   . Cirrhosis Father   . Drug abuse Brother     Social History   Tobacco Use  . Smoking status: Former Smoker    Packs/day: 2.00    Years: 50.00    Pack years: 100.00    Types: Cigarettes  . Smokeless tobacco: Never Used  . Tobacco comment: 07/06/18 at 10 cigs per day, quit 06/16/18  Substance Use Topics  . Alcohol use: Not Currently  . Drug use: Not Currently    Comment: hx of opiod abuse    Home Medications Prior to Admission medications   Medication Sig Start Date End Date Taking? Authorizing Provider  albuterol (PROVENTIL HFA;VENTOLIN HFA) 108 (90 Base) MCG/ACT inhaler Inhale 1-2 puffs into the lungs every 6 (six) hours as needed for wheezing.  03/30/18 06/18/19  [provider]  budesonide (PULMICORT) 0.5 MG/2ML nebulizer solution INHALE CONTENTS OF 1 VIAL VIA NEBULIZER 2 TIMES DAILY 10/02/18   Marshell Garfinkel, MD  diazepam (  VALIUM) 1 MG/ML solution Place 5-10 mLs (5-10 mg total) into feeding tube at bedtime as needed (for insomnia; do not take with alcohol or methadone). 06/26/19   Molpus, John, MD  ipratropium-albuterol (DUONEB) 0.5-2.5 (3) MG/3ML SOLN TAKE 3 MLS BY NEBULIZATION EVERY 6 (SIX) HOURS AS NEEDED. 10/02/18   Mannam, Hart Robinsons, MD  methadone (DOLOPHINE) 10 MG/ML solution Take 110 mg by mouth daily.    [provider]  nicotine (NICODERM CQ - DOSED IN MG/24 HOURS) 14 mg/24hr patch Place 1 patch (14 mg total) onto the skin daily. 05/10/19   Mannam, Hart Robinsons, MD  Nutritional Supplements (FEEDING SUPPLEMENT, OSMOLITE 1.5 CAL,) LIQD Give 410 ml 4 times daily via feeding tube (7  cartons daily needed) 09/25/18   [name], MD  Nutritional Supplements (PROMOD) LIQD Give 30 mL Promod twice daily via PEG. 08/22/18   [name], MD  PERFOROMIST 20 MCG/2ML nebulizer solution INHALE CONTENTS OF 1 VIAL VIA NEBULIZER 2 TIMES DAILY 10/02/18   Mannam, Praveen, MD  sucralfate (CARAFATE) 1 GM/10ML suspension TAKE 10 MLS (1 G TOTAL) BY MOUTH 2 (TWO) TIMES DAILY. 09/24/18   [name], MD  Water For Irrigation, Sterile (FREE WATER) SOLN Place 300 mLs into feeding tube every 8 (eight) hours. 08/03/18   Kerney Elbe, DO    Allergies    Patient has no known allergies.  Review of Systems   Review of Systems  All other systems reviewed and are negative.   Physical Exam Updated Vital Signs BP 133/66 (BP Location: Right Arm)   Pulse 74   Temp 97.6 F (36.4 C) (Oral)   Resp 17   SpO2 98%   Physical Exam Constitutional:      Appearance: He is cachectic. He is ill-appearing.  HENT:     Head: Normocephalic.     Nose: Nose normal.     Mouth/Throat:     Mouth: Mucous membranes are dry.     Comments: Edentulous Eyes:     Pupils: Pupils are equal, round, and reactive to light.  Cardiovascular:     Rate and Rhythm: Normal rate.     Pulses: Normal pulses.     Heart sounds: Normal heart sounds.  Pulmonary:     Effort: Pulmonary effort is normal. No respiratory distress.     Breath sounds: Normal breath sounds. No wheezing.  Abdominal:     General: Abdomen is flat. There is no distension.     Tenderness: There is no abdominal tenderness. There is no guarding.     Comments: Feeding tube present in the right upper abdomen without surrounding erythema or drainage  Musculoskeletal:        General: No swelling or tenderness.     Right lower leg: No edema.     Left lower leg: No edema.  Skin:    General: Skin is warm and dry.     Capillary Refill: Capillary refill takes less than 2 seconds.     Comments: Poor skin turgor  Neurological:      General: No focal deficit present.     Mental Status: He is alert and oriented to person, place, and time.     Sensory: No sensory deficit.     Coordination: Coordination normal.     Comments: 4 out of 5 strength in all 4 extremities.  No notable pronator drift.  Sensation is intact.  Psychiatric:        Mood and Affect: Mood normal.  Behavior: Behavior normal.     ED Results / Procedures / Treatments   Labs (all labs ordered are listed, but only abnormal results are displayed) Labs Reviewed  CBC WITH DIFFERENTIAL/PLATELET - Abnormal; Notable for the following components:      Result Value   RBC 3.58 (*)    Hemoglobin 11.2 (*)    HCT 35.4 (*)    RDW 19.5 (*)    Platelets 145 (*)    Lymphs Abs 0.4 (*)    All other components within normal limits  COMPREHENSIVE METABOLIC PANEL - Abnormal; Notable for the following components:   Glucose, Bld 212 (*)    BUN 95 (*)    Creatinine, Ser 2.34 (*)    Albumin 3.3 (*)    AST 142 (*)    ALT 108 (*)    GFR calc non Af Amer 29 (*)    GFR calc Af Amer 33 (*)    All other components within normal limits  RESPIRATORY PANEL BY RT PCR (FLU A&B, COVID)  LACTIC ACID, PLASMA  URINALYSIS, ROUTINE W REFLEX MICROSCOPIC    EKG EKG Interpretation  Date/Time:  Tuesday July 02 2019 08:31:31 EST Ventricular Rate:  58 PR Interval:    QRS Duration: 119 QT Interval:  466 QTC Calculation: 458 R Axis:   88 Text Interpretation: Sinus rhythm Nonspecific intraventricular conduction delay Artifact in lead(s) I III aVL No significant change since last tracing Confirmed by Blanchie Dessert [id number]) on 07/02/2019 8:45:18 AM   Radiology DG Chest Port 1 View  Result Date: 07/02/2019 CLINICAL DATA:  Weakness, fall. EXAM: PORTABLE CHEST 1 VIEW COMPARISON:  Multiple exams, including 03/20/2019 FINDINGS: Continued biapical scarring. Continued right mid lung consolidation likely involving the upper lobe and middle lobe, as shown on the CT scan of  06/10/2019. Mild nodularity left midlung, as on prior CT. Emphysema noted. The patient is rotated to the right on today's radiograph, reducing diagnostic sensitivity and specificity. Heart size within normal limits. No blunting of the costophrenic angles. IMPRESSION: 1. Continued consolidation in the right mid lung with some indistinct nodularity in the left mid lung. These findings are similar to those on recent chest CT of 06/10/2019. 2. Biapical scarring. 3.  Emphysema (ICD10-J43.9). Electronically Signed   By: Van Clines M.D.   On: 07/02/2019 09:17    Procedures Procedures (including critical care time)  Medications Ordered in ED Medications  lactated ringers bolus 1,000 mL (1,000 mLs Intravenous New Bag/Given 07/02/19 [id number])    ED Course  I have reviewed the triage vital signs and the nursing notes.  Pertinent labs & imaging results that were available during my care of the patient were reviewed by me and considered in my medical decision making (see chart for details).    MDM Rules/Calculators/A&P                      64 year old gentleman with a history of laryngeal cancer in remission now with trach takedown 2 weeks ago presenting with generalized weakness.  Patient has had no severe falls.  Pt denies resp issues and lungs clear.  Patient states that he is using his feeds every 4 hours but appears very dehydrated today with poor skin turgor, dry mouth.  He is denying any infectious symptoms such as fever, cough, increasing secretions.  He is having some dribbling and increased urination.  Will ensure no evidence of hyperglycemia, acute kidney injury, UTI.  Lower suspicion for Covid or pneumonia.  Labs, imaging and  EKG pending  10:10 AM Patient CBC without acute changes.  CMP today with new AKI with creatinine of 2.34 from his baseline of 0.77 and mild elevated LFTs.  This is most likely all related to dehydration.  Patient's blood sugar is 212.  Lactate is within normal limits.   Chest x-ray shows continued consolidation in the right middle lung with some indistinct nodularity in the left midlung.  Patient has recently had several CTs and followed up with pulmonology who felt that this was chronic aspiration.  At the end of January on the 26th he saw his pulmonologist who recommended no further antibiotics at this time and was hoping that this would improve once his trach healed.  He is not having any fever or elevated white count or symptoms of new productive cough or shortness of breath to suggest worsening at this time.  Will admit for generalized weakness, dehydration and new AKI.  Final Clinical Impression(s) / ED Diagnoses Final diagnoses:  Dehydration  AKI (acute kidney injury) ([place])  Generalized weakness    Rx / DC Orders ED Discharge Orders    None       Blanchie Dessert, MD 07/02/19 1013

## 2019-07-03 LAB — BASIC METABOLIC PANEL
Anion gap: 6 (ref 5–15)
BUN: 57 mg/dL — ABNORMAL HIGH (ref 8–23)
CO2: 30 mmol/L (ref 22–32)
Calcium: 8.9 mg/dL (ref 8.9–10.3)
Chloride: 113 mmol/L — ABNORMAL HIGH (ref 98–111)
Creatinine, Ser: 1.43 mg/dL — ABNORMAL HIGH (ref 0.61–1.24)
GFR calc Af Amer: 60 mL/min — ABNORMAL LOW (ref >60)
GFR calc non Af Amer: 52 mL/min — ABNORMAL LOW (ref >60)
Glucose, Bld: 160 mg/dL — ABNORMAL HIGH (ref 70–99)
Potassium: 3.9 mmol/L (ref 3.5–5.1)
Sodium: 149 mmol/L — ABNORMAL HIGH (ref 135–145)

## 2019-07-03 LAB — COMPREHENSIVE METABOLIC PANEL
ALT: 79 U/L — ABNORMAL HIGH (ref 0–44)
AST: 77 U/L — ABNORMAL HIGH (ref 15–41)
Albumin: 2.6 g/dL — ABNORMAL LOW (ref 3.5–5.0)
Alkaline Phosphatase: 63 U/L (ref 38–126)
Anion gap: 5 (ref 5–15)
BUN: 66 mg/dL — ABNORMAL HIGH (ref 8–23)
CO2: 32 mmol/L (ref 22–32)
Calcium: 8.8 mg/dL — ABNORMAL LOW (ref 8.9–10.3)
Chloride: 113 mmol/L — ABNORMAL HIGH (ref 98–111)
Creatinine, Ser: 1.66 mg/dL — ABNORMAL HIGH (ref 0.61–1.24)
GFR calc Af Amer: 50 mL/min — ABNORMAL LOW (ref >60)
GFR calc non Af Amer: 43 mL/min — ABNORMAL LOW (ref >60)
Glucose, Bld: 68 mg/dL — ABNORMAL LOW (ref 70–99)
Potassium: 4 mmol/L (ref 3.5–5.1)
Sodium: 150 mmol/L — ABNORMAL HIGH (ref 135–145)
Total Bilirubin: 0.6 mg/dL (ref 0.3–1.2)
Total Protein: 6.5 g/dL (ref 6.5–8.1)

## 2019-07-03 LAB — CBC
HCT: 32.3 % — ABNORMAL LOW (ref 39.0–52.0)
Hemoglobin: 10 g/dL — ABNORMAL LOW (ref 13.0–17.0)
MCH: 31.2 pg (ref 26.0–34.0)
MCHC: 31 g/dL (ref 30.0–36.0)
MCV: 100.6 fL — ABNORMAL HIGH (ref 80.0–100.0)
Platelets: 140 10*3/uL — ABNORMAL LOW (ref 150–400)
RBC: 3.21 MIL/uL — ABNORMAL LOW (ref 4.22–5.81)
RDW: 19.9 % — ABNORMAL HIGH (ref 11.5–15.5)
WBC: 6.2 10*3/uL (ref 4.0–10.5)
nRBC: 0 % (ref 0.0–0.2)

## 2019-07-03 MED ORDER — METHADONE HCL 10 MG/ML PO CONC
120.0000 mg | Freq: Every day | ORAL | Status: DC
  Administered 2019-07-03 – 2019-07-04 (×2): 120 mg
  Filled 2019-07-03 (×2): qty 12

## 2019-07-03 MED ORDER — DEXTROSE 5 % IV SOLN: INTRAVENOUS | Status: AC

## 2019-07-03 MED ORDER — SODIUM CHLORIDE 0.45 % IV SOLN: INTRAVENOUS | Status: DC

## 2019-07-03 NOTE — Progress Notes (Signed)
Placed #14 french suction catheters (2), AMBU bag, End Tidal, 6.0 ETT with holder in bag at bedside, along with suction system on wall. Asked unit secretary to order bedside pulse ox. PT states he does not use supplemental oxygen nor humidity at home- therefore, none here at this time.  PT also states that he was advised by ENT to cover stoma with guaze and adhere so he can vocalize and control secretions and fluids that exit stoma. PT also stated that ENT advised that stoma should close in 3-4 weeks (trach was removed by ENT).  Once stoma was covered PT stated he was not in any respiratory distress, Sp02 was 97%. RT asked RN Tech to please check Sp02 in approx 15 minutes (again bedside pulse ox has been requested). Surveyor, quantity is aware of above actions.

## 2019-07-03 NOTE — Progress Notes (Addendum)
Initial Nutrition Assessment  DOCUMENTATION CODES:   Underweight  INTERVENTION:  Continue tube feedings  - Osmolite 1.5: Bolus 410 ml 4 times daily (1640 ml/day)  - Free water flushes 300 ml Q6 hrs per MD  Provides 2450 kcal, 103 grams of protein, and 1249 ml free water (2449 ml H20 with flushes)   NUTRITION DIAGNOSIS:   Inadequate oral intake related to inability to eat(h/o laryngeal cancer s/p trach and PEG placement) as evidenced by NPO status.   GOAL:   Patient will meet greater than or equal to 90% of their needs   MONITOR:   TF tolerance, Weight trends, Labs, I & O's  REASON FOR ASSESSMENT:   Consult Enteral/tube feeding initiation and management, Assessment of nutrition requirement/status  ASSESSMENT:  RD working remotely.  64 year old male with past medical history significant for COPD, unspecified herpes, h/o lung nodule, h/o opioid abuse now on methadone, h/o laryngeal cancer in remission with tracheostomy and PEG, h/o aspiration pneumonia presented to ED with complaints of generalized weakness and associated multiple falls without injury when trying to stand up over the past 3 days.   RD attempted to contact pt via phone this morning, pt did not pick up.   Addendum: RD contacted by MD via secure chat this afternoon reguarding chronic wt loss concerns reported by daughter of patient and possible need for changing current regimen. Patient's home regimen was agreed to provide pt with adequate calories to promote weight gain and family would likely benefit from education. RD spoke with patient's daughter Gay Filler) via phone. RD educated on current pt reported regimen of 6 cans of Osmolite 1.5 with protein modular providing ~ 43 kcal/kg, which is adequate for promoting weight gain. RD inquired about consistency in daily feedings, daughter stated pt has occasionally missed feedings in the past if pt fell asleep early. Gay Filler reports that she recently just started back to  work and is not there for day time feeds, unsure if patient is feeding as reported. RD educated on the patient's estimated needs, calories/protein provided per feeding, and the importance of remaining consistent with regimen to promote weight gain. Daughter appreciative of RD phone call, all questions were answered.   Per chart, Home TF orders: Osmolite 1.5 - 410 ml 4 times daily (1640 ml/day) Promod 30 ml twice daily via PEG FWF 300 ml Q8 hrs  Provides 2650 kcal, 123 grams protein, 1249 ml H20 (2149 ml total with FWF)  Patient followed by oncology outpatient RD. Per 2/02 notes, pt reported usual feedings of 6 cartons of Osmolite 1.5 (2 cartons TID) with occasional additional carton on a 4th feeding. Flushing tube with 12 oz of water with feedings as well as 2 oz of protein modular with 6 oz of water. Provides 2330 kcal, 109 grams protein, and 1086 free H20 (2328 ml H2O with FWF)  Current wt 121 lbs Per weight history, pt has lost 11.44 lbs (8.6%) in the past 4 months which is severe for time frame. Patient is underweight (68% of IBW) and given recent weight trends highly suspect malnutrition, however unable to identify at this time.    Medications reviewed and include: Methadone Osmolite 1.5: 410 ml 4 times daily Free water 300 ml Q6 hrs  Labs: BG 68,212 x 24 hrs, Na 150 (H), BUN 66 (H), Cr 1.66 (H), Albumin 2.6 (L), Corrected Ca 9.92 (WNL)  NUTRITION - FOCUSED PHYSICAL EXAM: Unable to complete at this time, RD working remotely.  Diet Order:   Diet Order  Diet Heart Room service appropriate? Yes; Fluid consistency: Thin  Diet effective now              EDUCATION NEEDS:   No education needs have been identified at this time  Skin:  Skin Assessment: Reviewed RN Assessment  Last BM:  Unknown  Height:   Ht Readings from Last 1 Encounters:  07/03/19 6' (1.829 m)    Weight:   Wt Readings from Last 1 Encounters:  07/03/19 55 kg    Ideal Body Weight:  80.9  kg  BMI:  Body mass index is 16.44 kg/m.  Estimated Nutritional Needs:   Kcal:  [id number] (MSJ x 1.4-1.6)  Protein:  98-110  Fluid:  >/= 1.9 L/day   Lajuan Lines, RD, LDN Clinical Nutrition Office Telephone [phone] After Hours/Weekend Pager: [phone]

## 2019-07-03 NOTE — Evaluation (Addendum)
Physical Therapy Evaluation Patient Details Name: Ronald Ruiz MRN: [id number] DOB: 1956-05-16 Today's Date: 07/03/2019   History of Present Illness  64 yo male admitted with AKI, fall, general weakness. Hx of substance abuse, COPD, herpes, opioid abuse, laryngeal ca, asp pna, tracheostomy.  Clinical Impression  On eval, pt was Min guard-Min assist for ambulation-see gait details below. Will plan to follow during hospital stay. Pt could potentially benefit from PT f/u for strength and balance training (HHPT vs OP PT) if these options are at all feasible for pt.     Follow Up Recommendations Home health PT (if possible) vs Outpatient PT     Equipment Recommendations  (continuing to assess)    Recommendations for Other Services       Precautions / Restrictions Precautions Precautions: Fall Restrictions Weight Bearing Restrictions: No      Mobility  Bed Mobility Overal bed mobility: Independent                Transfers Overall transfer level: Modified independent                  Ambulation/Gait Ambulation/Gait assistance: Min guard;Min assist Gait Distance (Feet): 150 Feet Assistive device: None Gait Pattern/deviations: Step-to pattern;Step-through pattern;Decreased stride length;Decreased dorsiflexion - right;Decreased dorsiflexion - left     General Gait Details: At times, pt has difficulty completing swing through phases of gait. Feet/shoes tend to "catch" on floor and throw him off balance. Intermittent assist to steady.  Stairs            Wheelchair Mobility    Modified Rankin (Stroke Patients Only)       Balance Overall balance assessment: Needs assistance           Standing balance-Leahy Scale: Fair                               Pertinent Vitals/Pain Pain Assessment: No/denies pain    Home Living Family/patient expects to be discharged to:: Private residence   Available Help at Discharge: Family Type of  Home: House       Home Layout: One level        Prior Function Level of Independence: Independent         Comments: Retired Freight forwarder at [employer]        Extremity/Trunk Assessment   Upper Extremity Assessment Upper Extremity Assessment: Overall WFL for tasks assessed    Lower Extremity Assessment Lower Extremity Assessment: Generalized weakness       Communication   Communication: Tracheostomy  Cognition Arousal/Alertness: Awake/alert Behavior During Therapy: WFL for tasks assessed/performed Overall Cognitive Status: Within Functional Limits for tasks assessed                                        General Comments      Exercises     Assessment/Plan    PT Assessment Patient needs continued PT services  PT Problem List Decreased mobility;Decreased balance;Decreased knowledge of use of DME       PT Treatment Interventions DME instruction;Gait training;Therapeutic activities;Therapeutic exercise;Patient/family education;Balance training;Functional mobility training    PT Goals (Current goals can be found in the Care Plan section)  Acute Rehab PT Goals Patient Stated Goal: none stated PT Goal Formulation: With patient Time For Goal Achievement: 07/17/19 Potential to Achieve Goals:  Good    Frequency Min 3X/week   Barriers to discharge        Co-evaluation               AM-PAC PT "6 Clicks" Mobility  Outcome Measure Help needed turning from your back to your side while in a flat bed without using bedrails?: None Help needed moving from lying on your back to sitting on the side of a flat bed without using bedrails?: None Help needed moving to and from a bed to a chair (including a wheelchair)?: None Help needed standing up from a chair using your arms (e.g., wheelchair or bedside chair)?: A Little Help needed to walk in hospital room?: A Little Help needed climbing 3-5 steps with a railing? : A Little 6 Click  Score: 21    End of Session Equipment Utilized During Treatment: Gait belt Activity Tolerance: Patient tolerated treatment well Patient left: in bed;with call bell/phone within reach;with family/visitor present   PT Visit Diagnosis: History of falling (Z91.81);Difficulty in walking, not elsewhere classified (R26.2);Unsteadiness on feet (R26.81)    Time: [id number] PT Time Calculation (min) (ACUTE ONLY): 14 min   Charges:   PT Evaluation $PT Eval Moderate Complexity: 1 Low            Keeton Kassebaum P, PT Acute Rehabilitation

## 2019-07-03 NOTE — Progress Notes (Signed)
Checked on PT again before leaving 6E- states he is breathing fine, Sp02 96%.

## 2019-07-03 NOTE — Progress Notes (Signed)
PROGRESS NOTE    Ronald Ruiz  CVE:[id number] DOB: 04/13/1956 DOA: 07/02/2019 PCP: [name], MD   Brief Narrative:  [name] is Ronald Ruiz 64 y.o. 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  His tracheostomy was removed Friday.  For the past 3 days he has been having weakness with multiple falls where he tries to stand up, feels his legs becoming very weak and falls back down to his bed.  He denies having LOC or any trauma associated with his falls.  No fever, chills, headache, chest pain, worsening dyspnea, abdominal pain, nausea, vomiting, change in bowel habits, melena or hematochezia.  He denies changes in hearing color, dysuria, frequency, hematuria or oliguria.  No polyuria, polydipsia, polyphagia or blurred vision.  Assessment & Plan:   Principal Problem:   AKI (acute kidney injury) ([place]) Active Problems:   Protein-calorie malnutrition, severe   COPD (chronic obstructive pulmonary disease) (HCC)   Transaminitis   Normocytic anemia   Chronic pulmonary aspiration   History of throat cancer  Dehydration  General Malaise  Weakness:  Sounds like everything started after he took dose of valium last week - ? Whether after this with confusion he may have missed tube feeds/meds and become dehydrated as Keevin Panebianco result of this. Continue tube feeds, IVF PT evaluation Seems to be improving  Acute Kidney Injury  Hypernatremia Baseline creatinine <1, presented with creatinine of 2.34 Continue IVF, improving creatining, worsening hypernatremia, change to 1/2 NS UA negative for protein and with 0-5 RBC/HPF  Protein-calorie malnutrition, severe Consult nutritional service, appreciate  recs  COPD (chronic obstructive pulmonary disease) ([place]) Supplemental oxygen as needed. Continue nebulized Pulmicort twice daily. Bronchodilators as needed.  Transaminitis Likely due to dehydration Follow acute hepatitis panel Improving  Normocytic anemia Likely higher than baseline due to hemoconcentration. Monitor hematocrit and hemoglobin.  Chronic pulmonary aspiration No fever, chills or worsening dyspnea CXR on 2/9 with consolidation in R mid lung with indistinct nodularity in L mid lung, similar to chest CT from 1/18 No elevated white blood count and normal lactic. Per pulmonology no need for further antibiotics Continue speech follow up outpatient and routine f/u with pulm Continue to observation for now.  History of throat cancer Currently in remission. Follow-up with oncology/pulmonology as scheduled. [name] recently decannulated (see RT note from 2/10)  DVT prophylaxis: lovenox Code Status: full Family Communication: daughter at bedside Disposition Plan:  . Patient came from:  home          . Anticipated d/c place: home . Barriers to d/c OR conditions which need to be met to effect Sabien Umland safe d/c: pending further improvement in renal function, d/c IVF  Consultants:   none  Procedures:   none  Antimicrobials:  Anti-infectives (From admission, onward)   None     Subjective: Feeling better Not quite back to baseline  Objective: Vitals:   07/03/19 0542 07/03/19 1351 07/03/19 1405 07/03/19 1459  BP: 132/67   129/71  Pulse: (!) 56 68 (!) 56 61  Resp: '18 17  17  '$ Temp: 98.3 F (36.8 C)   98 F (36.7 C)  TempSrc: Oral   Oral  SpO2: 97% 97% 93% 95%  Weight:      Height:  Intake/Output Summary (Last 24 hours) at 07/03/2019 1544 Last data filed at 07/03/2019 1128 Gross per 24 hour  Intake 1794 ml  Output 3050 ml  Net -1256 ml   Filed Weights   07/03/19 0542  Weight: 55 kg    Examination:  General exam: Appears calm and comfortable   Respiratory system: Clear to auscultation. Respiratory effort normal.  Stoma from decannulation of trach site. Cardiovascular system: S1 & S2 heard, RRR.  Gastrointestinal system: Abdomen is nondistended, soft and nontender.  PEG in place. Central nervous system: Alert and oriented. No focal neurological deficits. Extremities: no LEE Skin: No rashes, lesions or ulcers Psychiatry: Judgement and insight appear normal. Mood & affect appropriate.     Data Reviewed: I have personally reviewed following labs and imaging studies  CBC: Recent Labs  Lab 07/02/19 0838 07/03/19 0439  WBC 6.7 6.2  NEUTROABS 5.8  --   HGB 11.2* 10.0*  HCT 35.4* 32.3*  MCV 98.9 100.6*  PLT 145* 409*   Basic Metabolic Panel: Recent Labs  Lab 07/02/19 0838 07/03/19 0439  NA 144 150*  K 4.4 4.0  CL 107 113*  CO2 27 32  GLUCOSE 212* 68*  BUN 95* 66*  CREATININE 2.34* 1.66*  CALCIUM 9.2 8.8*   GFR: Estimated Creatinine Clearance: 35.4 mL/min (Tavaughn Silguero) (by C-G formula based on SCr of 1.66 mg/dL (H)). Liver Function Tests: Recent Labs  Lab 07/02/19 0838 07/03/19 0439  AST 142* 77*  ALT 108* 79*  ALKPHOS 72 63  BILITOT 0.5 0.6  PROT 7.6 6.5  ALBUMIN 3.3* 2.6*   No results for input(s): LIPASE, AMYLASE in the last 168 hours. No results for input(s): AMMONIA in the last 168 hours. Coagulation Profile: No results for input(s): INR, PROTIME in the last 168 hours. Cardiac Enzymes: No results for input(s): CKTOTAL, CKMB, CKMBINDEX, TROPONINI in the last 168 hours. BNP (last 3 results) No results for input(s): PROBNP in the last 8760 hours. HbA1C: No results for input(s): HGBA1C in the last 72 hours. CBG: No results for input(s): GLUCAP in the last 168 hours. Lipid Profile: No results for input(s): CHOL, HDL, LDLCALC, TRIG, CHOLHDL, LDLDIRECT in the last 72 hours. Thyroid Function Tests: No results for input(s): TSH, T4TOTAL, FREET4, T3FREE, THYROIDAB in the last 72 hours. Anemia Panel: No results  for input(s): VITAMINB12, FOLATE, FERRITIN, TIBC, IRON, RETICCTPCT in the last 72 hours. Sepsis Labs: Recent Labs  Lab 07/02/19 8119  LATICACIDVEN 0.9    Recent Results (from the past 240 hour(s))  Respiratory Panel by RT PCR (Flu Savi Lastinger&B, Covid) - Nasopharyngeal Swab     Status: None   Collection Time: 07/02/19 10:10 AM   Specimen: Nasopharyngeal Swab  Result Value Ref Range Status   SARS Coronavirus 2 by RT PCR NEGATIVE NEGATIVE Final    Comment: (NOTE) SARS-CoV-2 target nucleic acids are NOT DETECTED. The SARS-CoV-2 RNA is generally detectable in upper respiratoy specimens during the acute phase of infection. The lowest concentration of SARS-CoV-2 viral copies this assay can detect is 131 copies/mL. Chayce Rullo negative result does not preclude SARS-Cov-2 infection and should not be used as the sole basis for treatment or other patient management decisions. Altan Kraai negative result may occur with  improper specimen collection/handling, submission of specimen other than nasopharyngeal swab, presence of viral mutation(s) within the areas targeted by this assay, and inadequate number of viral copies (<131 copies/mL). Marshay Slates negative result must be combined with clinical observations, patient history, and epidemiological information. The expected result is Negative. Fact Sheet for  Patients:  PinkCheek.be Fact Sheet for Healthcare Providers:  GravelBags.it This test is not yet ap proved or cleared by the [place] and  has been authorized for detection and/or diagnosis of SARS-CoV-2 by FDA under an Emergency Use Authorization (EUA). This EUA will remain  in effect (meaning this test can be used) for the duration of the COVID-19 declaration under Section 564(b)(1) of the Act, 21 U.S.C. section 360bbb-3(b)(1), unless the authorization is terminated or revoked sooner.    Influenza Wilgus Deyton by PCR NEGATIVE NEGATIVE Final   Influenza B by PCR NEGATIVE  NEGATIVE Final    Comment: (NOTE) The Xpert Xpress SARS-CoV-2/FLU/RSV assay is intended as an aid in  the diagnosis of influenza from Nasopharyngeal swab specimens and  should not be used as Naturi Alarid sole basis for treatment. Nasal washings and  aspirates are unacceptable for Xpert Xpress SARS-CoV-2/FLU/RSV  testing. Fact Sheet for Patients: PinkCheek.be Fact Sheet for Healthcare Providers: GravelBags.it This test is not yet approved or cleared by the [place] FDA and  has been authorized for detection and/or diagnosis of SARS-CoV-2 by  FDA under an Emergency Use Authorization (EUA). This EUA will remain  in effect (meaning this test can be used) for the duration of the  Covid-19 declaration under Section 564(b)(1) of the Act, 21  U.S.C. section 360bbb-3(b)(1), unless the authorization is  terminated or revoked. Performed at [facility], [place] [address] [place], [place] [address]          Radiology Studies: [facility] Chest Port 1 View  Result Date: 07/02/2019 CLINICAL DATA:  Weakness, fall. EXAM: PORTABLE CHEST 1 VIEW COMPARISON:  Multiple exams, including 03/20/2019 FINDINGS: Continued biapical scarring. Continued right mid lung consolidation likely involving the upper lobe and middle lobe, as shown on the CT scan of 06/10/2019. Mild nodularity left midlung, as on prior CT. Emphysema noted. The patient is rotated to the right on today's radiograph, reducing diagnostic sensitivity and specificity. Heart size within normal limits. No blunting of the costophrenic angles. IMPRESSION: 1. Continued consolidation in the right mid lung with some indistinct nodularity in the left mid lung. These findings are similar to those on recent chest CT of 06/10/2019. 2. Biapical scarring. 3.  Emphysema (ICD10-J43.9). Electronically Signed   By: Van Clines M.D.   On: 07/02/2019 09:17        Scheduled Meds: . enoxaparin  (LOVENOX) injection  30 mg Subcutaneous Daily  . feeding supplement (OSMOLITE 1.5 CAL)  410 mL Per Tube QID  . free water  300 mL Per Tube Q6H  . methadone  120 mg Per Tube Daily   Continuous Infusions: . sodium chloride 75 mL/hr at 07/03/19 1006     LOS: 0 days    Time spent: over 30 min    Fayrene Helper, MD [facility]   To contact the attending provider between 7A-7P or the covering provider during after hours 7P-7A, please log into the web site [email or web address] and access using universal [place] password for that web site. If you do not have the password, please call the hospital operator.  07/03/2019, 3:44 PM

## 2019-07-04 ENCOUNTER — Inpatient Hospital Stay: Payer: BC Managed Care – PPO | Admitting: Hematology

## 2019-07-04 ENCOUNTER — Inpatient Hospital Stay: Payer: BC Managed Care – PPO

## 2019-07-04 LAB — COMPREHENSIVE METABOLIC PANEL
ALT: 67 U/L — ABNORMAL HIGH (ref 0–44)
AST: 48 U/L — ABNORMAL HIGH (ref 15–41)
Albumin: 2.7 g/dL — ABNORMAL LOW (ref 3.5–5.0)
Alkaline Phosphatase: 61 U/L (ref 38–126)
Anion gap: 9 (ref 5–15)
BUN: 49 mg/dL — ABNORMAL HIGH (ref 8–23)
CO2: 31 mmol/L (ref 22–32)
Calcium: 9 mg/dL (ref 8.9–10.3)
Chloride: 108 mmol/L (ref 98–111)
Creatinine, Ser: 1.31 mg/dL — ABNORMAL HIGH (ref 0.61–1.24)
GFR calc Af Amer: >60 mL/min (ref >60)
GFR calc non Af Amer: 58 mL/min — ABNORMAL LOW (ref >60)
Glucose, Bld: 75 mg/dL (ref 70–99)
Potassium: 4.1 mmol/L (ref 3.5–5.1)
Sodium: 148 mmol/L — ABNORMAL HIGH (ref 135–145)
Total Bilirubin: 0.5 mg/dL (ref 0.3–1.2)
Total Protein: 6.7 g/dL (ref 6.5–8.1)

## 2019-07-04 LAB — HEPATITIS PANEL, ACUTE
HCV Ab: NONREACTIVE
Hep A IgM: NONREACTIVE
Hep B C IgM: NONREACTIVE
Hepatitis B Surface Ag: NONREACTIVE

## 2019-07-04 LAB — CBC WITH DIFFERENTIAL/PLATELET
Abs Immature Granulocytes: 0.03 10*3/uL (ref 0.00–0.07)
Basophils Absolute: 0 10*3/uL (ref 0.0–0.1)
Basophils Relative: 0 %
Eosinophils Absolute: 0.1 10*3/uL (ref 0.0–0.5)
Eosinophils Relative: 2 %
HCT: 37 % — ABNORMAL LOW (ref 39.0–52.0)
Hemoglobin: 11.2 g/dL — ABNORMAL LOW (ref 13.0–17.0)
Immature Granulocytes: 0 %
Lymphocytes Relative: 12 %
Lymphs Abs: 0.9 10*3/uL (ref 0.7–4.0)
MCH: 30.7 pg (ref 26.0–34.0)
MCHC: 30.3 g/dL (ref 30.0–36.0)
MCV: 101.4 fL — ABNORMAL HIGH (ref 80.0–100.0)
Monocytes Absolute: 0.7 10*3/uL (ref 0.1–1.0)
Monocytes Relative: 10 %
Neutro Abs: 5.5 10*3/uL (ref 1.7–7.7)
Neutrophils Relative %: 76 %
Platelets: 147 10*3/uL — ABNORMAL LOW (ref 150–400)
RBC: 3.65 MIL/uL — ABNORMAL LOW (ref 4.22–5.81)
RDW: 19.9 % — ABNORMAL HIGH (ref 11.5–15.5)
WBC: 7.2 10*3/uL (ref 4.0–10.5)
nRBC: 0 % (ref 0.0–0.2)

## 2019-07-04 LAB — PHOSPHORUS: Phosphorus: 2.1 mg/dL — ABNORMAL LOW (ref 2.5–4.6)

## 2019-07-04 LAB — MAGNESIUM: Magnesium: 2.1 mg/dL (ref 1.7–2.4)

## 2019-07-04 MED ORDER — ENOXAPARIN SODIUM 40 MG/0.4ML ~~LOC~~ SOLN
40.0000 mg | Freq: Every day | SUBCUTANEOUS | Status: DC
  Administered 2019-07-04: 40 mg via SUBCUTANEOUS
  Filled 2019-07-04: qty 0.4

## 2019-07-04 MED ORDER — FREE WATER: 300.0000 mL | Freq: Four times a day (QID) | 0 refills | Status: DC

## 2019-07-04 NOTE — Progress Notes (Signed)
Reviewed discharge paperwork with patient, follow up appointments, medication regimen, & provided prescription. Patient discharged via wheelchair by NT.

## 2019-07-04 NOTE — Discharge Summary (Signed)
Physician Discharge Summary  Ronald Ruiz [id number] DOB: 05-02-1956 DOA: 07/02/2019  PCP: Ronald Fraise, MD  Admit date: 07/02/2019 Discharge date: 07/04/2019  Time spent: 40 minutes  Recommendations for Outpatient Follow-up:  1. Follow outpatient CMP/CBC (attention to kidney function and sodium) 2. Stop valium 3. Follow with speech outpatient as scheduled 4. Follow with ENT outpatient 5. Follow with pulm outpatient   Discharge Diagnoses:  Principal Problem:   AKI (acute kidney injury) ([place]) Active Problems:   Protein-calorie malnutrition, severe   COPD (chronic obstructive pulmonary disease) ([facility])   Transaminitis   Normocytic anemia   Chronic pulmonary aspiration   History of throat cancer   Discharge Condition: stable  Diet recommendation: npo  Filed Weights   07/03/19 0542  Weight: 55 kg    History of present illness:  Ronald Ruiz 64 y.o.malewith medical history significant ofCOPD, unspecified herpes, history of lung nodule, history of opioid abuse now on methadone, 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 His tracheostomy was removed Friday.For the past 3 days he has been having weakness with multiple falls where he tries to stand up, feels his legsbecoming very weak and falls back down to his bed. He denies having LOC or any trauma associated with his falls. No fever, chills, headache, chest pain, worsening dyspnea, abdominal pain, nausea, vomiting, change in bowel habits, melena or hematochezia. He denies changes in hearing color, dysuria, frequency, hematuria or oliguria. No polyuria, polydipsia, polyphagia or blurred vision.  He was admitted after Ronald Ruiz fall with generalized weakness.  He notes this started after he took Ronald Ruiz dose of valium.  Suspect that he may have gotten  confused and missed some of his tube feeds leading to dehydration, generalized weakness, and AKI.  He's improved here with IVF and resuming tube feeds.  Plan for home 2/11 with daughter to help care for him.  He declined home health services.  Hospital Course:  Dehydration  General Malaise  Weakness:  Sounds like everything started after he took dose of valium last week - ? Whether after this with confusion he may have missed tube feeds/meds and become dehydrated as Ronald Ruiz result of this. Continue tube feeds PT evaluation -> recommended 24 hr supervision/assistance - pt declined home health - daughter will be home to help him  Acute Kidney Injury  Hypernatremia Baseline creatinine <1, presented with creatinine of 2.34 Continues to improved today, stable for discharge -> will need outpatient f/u for repeat BMP Suspect hypernatremia related to NS administration here as his sodium was normal on presentation, follow outpatient UA negative for protein and with 0-5 RBC/HPF  Protein-calorie malnutrition, severe Consult nutritional service, appreciate recs  COPD (chronic obstructive pulmonary disease) ([place]) Supplemental oxygen as needed. Continue nebulized Pulmicort twice daily. Bronchodilators as needed.  Transaminitis Likely due to dehydration Follow acute hepatitis panel - negative Improving  Normocytic anemia Likely higher than baseline due to hemoconcentration. Monitor hematocrit and hemoglobin.  Chronic pulmonary aspiration No fever, chills or worsening dyspnea CXR on 2/9 with consolidation in R mid lung with indistinct nodularity in L mid lung, similar to chest CT from 1/18 No elevated white blood count and normal lactic. Per pulmonology no need for further antibiotics Continue speech follow up outpatient and routine f/u with pulm Continue to observation for now.  History of throat cancer Currently in remission. Follow-up with oncology as scheduled. Ronald Ruiz recently  decannulated (see RT note from 2/10) Follow up with ENT as planned  Procedures: none  Consultations:  none  Discharge Exam: Vitals:   07/04/19 0447 07/04/19 0907  BP: 140/70   Pulse: 60   Resp: 16   Temp: 98 F (36.7 C)   SpO2: 97% 95%   Feeling well, would like to go home Discussed d/c plan with daughter who lives with him, she was agreeable with d/c plan  General: No acute distress. Cardiovascular: Heart sounds show Ronald Ruiz regular rate, and rhythm. Lungs: Clear to auscultation bilaterally - stoma Abdomen: Soft, nontender, nondistended, peg tube Neurological: Alert and oriented 3. Moves all extremities 4. Cranial nerves II through XII grossly intact. Skin: Warm and dry. No rashes or lesions. Extremities: No clubbing or cyanosis. No edema.    Discharge Instructions   Discharge Instructions    Call MD for:  difficulty breathing, headache or visual disturbances   Complete by: As directed    Call MD for:  extreme fatigue   Complete by: As directed    Call MD for:  persistant dizziness or light-headedness   Complete by: As directed    Call MD for:  persistant nausea and vomiting   Complete by: As directed    Call MD for:  redness, tenderness, or signs of infection (pain, swelling, redness, odor or green/yellow discharge around incision site)   Complete by: As directed    Call MD for:  severe uncontrolled pain   Complete by: As directed    Call MD for:  temperature >100.4   Complete by: As directed    Discharge instructions   Complete by: As directed    You were seen for dehydration and after [name] fall.  We think this was related to [name] bad reaction to the valium and subsequent dehydration after you may not have gotten enough of your tube feeds (possible missed feeds).  You've improved here.  Continue your tube feeds at home as prescribed.  Your free water has been increased to 300 ml every 6 hours.    Please follow up with speech therapy outpatient as planned.  Follow up  with pulmonology as an outpatient.  Follow up with your PCP or another one of your providers regarding [name] repeat lab to check your kidney function and sodium.    Return for new, recurrent, or worsening symptoms.    Please ask your PCP to request records from this hospitalization so they know what was done and what the next steps will be.   Increase activity slowly   Complete by: As directed    Increase activity slowly   Complete by: As directed      Allergies as of 07/04/2019   No Known Allergies     Medication List    STOP taking these medications   diazepam 1 MG/ML solution Commonly known as: VALIUM     TAKE these medications   albuterol 108 (90 Base) MCG/ACT inhaler Commonly known as: VENTOLIN HFA Inhale 1-2 puffs into the lungs every 6 (six) hours as needed for wheezing.   budesonide 0.5 MG/2ML nebulizer solution Commonly known as: PULMICORT INHALE CONTENTS OF 1 VIAL VIA NEBULIZER 2 TIMES DAILY What changed: See the new instructions.   free water Soln Place 300 mLs into feeding tube every 6 (six) hours. What changed: when to take this   ipratropium-albuterol 0.5-2.5 (3) MG/3ML Soln Commonly known as: DUONEB TAKE 3 MLS BY NEBULIZATION EVERY 6 (SIX) HOURS AS NEEDED. What changed: reasons to take this  methadone 10 MG/ML solution Commonly known as: DOLOPHINE Take 120 mg by mouth daily.   nicotine 14 mg/24hr patch Commonly known as: NICODERM CQ - dosed in mg/24 hours Place 1 patch (14 mg total) onto the skin daily.   ProMod Liqd Give 30 mL Promod twice daily via PEG.   feeding supplement (OSMOLITE 1.5 CAL) Liqd Give 410 ml 4 times daily via feeding tube (7 cartons daily needed)      No Known Allergies    The results of significant diagnostics from this hospitalization (including imaging, microbiology, ancillary and laboratory) are listed below for reference.    Significant Diagnostic Studies: CT Chest Wo Contrast  Result Date: 06/10/2019 CLINICAL  DATA:  Follow-up of abnormal chest radiograph and CT. History of recent throat cancer. COPD. EXAM: CT CHEST WITHOUT CONTRAST TECHNIQUE: Multidetector CT imaging of the chest was performed following the standard protocol without IV contrast. COMPARISON:  05/06/2019 FINDINGS: Cardiovascular: Aortic and branch vessel atherosclerosis. Normal heart size, without pericardial effusion. Multivessel coronary artery atherosclerosis. Mediastinum/Nodes: Tracheostomy. Precarinal node measures 1.1 cm on 60/2 versus 1.4 cm on the prior (when remeasured). Hilar regions poorly evaluated without intravenous contrast. Lungs/Pleura: No pleural fluid. Biapical pleuroparenchymal scarring. Moderate centrilobular and paraseptal emphysema. Improvement in consolidation within the inferior right upper lobe and superior right medial lobe. Residual airspace disease with central cavitation remains including on 71/3. Other patchy pulmonary opacities within the right upper lobe, including Brion Sossamon cavitary nodular lesion of 1.4 x 1.4 cm on 41/3, similar (when remeasured). Significantly worsened right lower lobe aeration. Areas of tree in bud nodularity within the anterior right lower lobe on image 102/3 are new or significantly increased. There are larger nodular airspace opacities posteriorly, including on 85/3. Areas of left upper lobe consolidation are relatively similar. Frady Taddeo nodular component with minimal central cavitation measures 1.6 x 1.5 cm on 67/3 versus 2.1 x 1.4 cm on the prior. Nodular opacities in the left lower lobe are also relatively similar. Example at 6 mm on 86/3. Upper Abdomen: Gastrostomy tube, incompletely imaged. Normal imaged portions of the liver, spleen, adrenal glands, kidneys. Left renal vascular calcifications. Musculoskeletal: No acute osseous abnormality. IMPRESSION: 1. Improved consolidation within the inferior right upper and superior right middle lobes with residual cavitary airspace disease, likely related to  pneumonia/aspiration with necrosis. 2. Worsened right lower lobe aeration, highly suspicious for progressive aspiration. Relatively similar left-sided infection/aspiration. 3. No typical findings of pulmonary metastasis, given underlying infectious/inflammatory process. 4. Improved thoracic adenopathy, likely reactive. 5. Aortic atherosclerosis (ICD10-I70.0), coronary artery atherosclerosis and emphysema (ICD10-J43.9). Electronically Signed   By: Abigail Miyamoto M.D.   On: 06/10/2019 14:10   DG Chest Port 1 View  Result Date: 07/02/2019 CLINICAL DATA:  Weakness, fall. EXAM: PORTABLE CHEST 1 VIEW COMPARISON:  Multiple exams, including 03/20/2019 FINDINGS: Continued biapical scarring. Continued right mid lung consolidation likely involving the upper lobe and middle lobe, as shown on the CT scan of 06/10/2019. Mild nodularity left midlung, as on prior CT. Emphysema noted. The patient is rotated to the right on today's radiograph, reducing diagnostic sensitivity and specificity. Heart size within normal limits. No blunting of the costophrenic angles. IMPRESSION: 1. Continued consolidation in the right mid lung with some indistinct nodularity in the left mid lung. These findings are similar to those on recent chest CT of 06/10/2019. 2. Biapical scarring. 3.  Emphysema (ICD10-J43.9). Electronically Signed   By: Van Clines M.D.   On: 07/02/2019 09:17    Microbiology: Recent Results (from the past 240 hour(s))  Respiratory Panel by RT PCR (Flu Lolitha Tortora&B, Covid) - Nasopharyngeal Swab     Status: None   Collection Time: 07/02/19 10:10 AM   Specimen: Nasopharyngeal Swab  Result Value Ref Range Status   SARS Coronavirus 2 by RT PCR NEGATIVE NEGATIVE Final    Comment: (NOTE) SARS-CoV-2 target nucleic acids are NOT DETECTED. The SARS-CoV-2 RNA is generally detectable in upper respiratoy specimens during the acute phase of infection. The lowest concentration of SARS-CoV-2 viral copies this assay can detect is 131  copies/mL. Neftali Abair negative result does not preclude SARS-Cov-2 infection and should not be used as the sole basis for treatment or other patient management decisions. Essense Bousquet negative result may occur with  improper specimen collection/handling, submission of specimen other than nasopharyngeal swab, presence of viral mutation(s) within the areas targeted by this assay, and inadequate number of viral copies (<131 copies/mL). Kourtland Coopman negative result must be combined with clinical observations, patient history, and epidemiological information. The expected result is Negative. Fact Sheet for Patients:  PinkCheek.be Fact Sheet for Healthcare Providers:  GravelBags.it This test is not yet ap proved or cleared by the [place] FDA and  has been authorized for detection and/or diagnosis of SARS-CoV-2 by FDA under an Emergency Use Authorization (EUA). This EUA will remain  in effect (meaning this test can be used) for the duration of the COVID-19 declaration under Section 564(b)(1) of the Act, 21 U.S.C. section 360bbb-3(b)(1), unless the authorization is terminated or revoked sooner.    Influenza Callahan Wild by PCR NEGATIVE NEGATIVE Final   Influenza B by PCR NEGATIVE NEGATIVE Final    Comment: (NOTE) The Xpert Xpress SARS-CoV-2/FLU/RSV assay is intended as an aid in  the diagnosis of influenza from Nasopharyngeal swab specimens and  should not be used as Trinton Prewitt sole basis for treatment. Nasal washings and  aspirates are unacceptable for Xpert Xpress SARS-CoV-2/FLU/RSV  testing. Fact Sheet for Patients: PinkCheek.be Fact Sheet for Healthcare Providers: GravelBags.it This test is not yet approved or cleared by the [place] FDA and  has been authorized for detection and/or diagnosis of SARS-CoV-2 by  FDA under an Emergency Use Authorization (EUA). This EUA will remain  in effect (meaning this test can  be used) for the duration of the  Covid-19 declaration under Section 564(b)(1) of the Act, 21  U.S.C. section 360bbb-3(b)(1), unless the authorization is  terminated or revoked. Performed at [facility], [place] [address] [place], [place] [address]      Labs: Basic Metabolic Panel: Recent Labs  Lab 07/02/19 [phone] 07/03/19 0439 07/03/19 1546 07/04/19 0436  NA 144 150* 149* 148*  K 4.4 4.0 3.9 4.1  CL 107 113* 113* 108  CO2 27 32 30 31  GLUCOSE 212* 68* 160* 75  BUN 95* 66* 57* 49*  CREATININE 2.34* 1.66* 1.43* 1.31*  CALCIUM 9.2 8.8* 8.9 9.0  MG  --   --   --  2.1  PHOS  --   --   --  2.1*   Liver Function Tests: Recent Labs  Lab 07/02/19 0838 07/03/19 0439 07/04/19 0436  AST 142* 77* 48*  ALT 108* 79* 67*  ALKPHOS 72 63 61  BILITOT 0.5 0.6 0.5  PROT 7.6 6.5 6.7  ALBUMIN 3.3* 2.6* 2.7*   No results for input(s): LIPASE, AMYLASE in the last 168 hours. No results for input(s): AMMONIA in the last 168 hours. CBC: Recent Labs  Lab 07/02/19 0838 07/03/19 0439 07/04/19 0436  WBC 6.7 6.2 7.2  NEUTROABS 5.8  --  5.5  HGB 11.2* 10.0* 11.2*  HCT 35.4* 32.3* 37.0*  MCV 98.9 100.6* 101.4*  PLT 145* 140* 147*   Cardiac Enzymes: No results for input(s): CKTOTAL, CKMB, CKMBINDEX, TROPONINI in the last 168 hours. BNP: BNP (last 3 results) No results for input(s): BNP in the last 8760 hours.  ProBNP (last 3 results) No results for input(s): PROBNP in the last 8760 hours.  CBG: No results for input(s): GLUCAP in the last 168 hours.     Signed:  Fayrene Helper MD.  [facility] 07/04/2019, 1:37 PM

## 2019-07-04 NOTE — TOC Transition Note (Signed)
Transition of Care [facility]) - CM/SW Discharge Note   Patient Details  Name: Ronald Ruiz MRN: [id number] Date of Birth: [birth year]  Transition of Care [facility]) CM/SW Contact:  Lynnell Catalan, RN Phone Number: 07/04/2019, 12:49 PM   Clinical Narrative:     This CM spoke with pt at bedside about home needs. Pt states that he has all equipment at home and is not interested in home physical therapy at this time. It was confirmed with pt that he has someone at home to assist him 24hrs a day.   Readmission Risk Interventions Readmission Risk Prevention Plan 07/04/2019  Transportation Screening Complete  Medication Review Press photographer) Complete  PCP or Specialist appointment within 3-5 days of discharge Complete  HRI or [place] Complete  SW Recovery Care/Counseling Consult Complete  [place] Not Applicable  Some recent data might be hidden

## 2019-07-04 NOTE — Evaluation (Signed)
Occupational Therapy Evaluation Patient Details Name: Ronald Ruiz MRN: [id number] DOB: 1956/01/14 Today's Date: 07/04/2019    History of Present Illness 64 yo male admitted with AKI, fall, general weakness. Hx of substance abuse, COPD, herpes, opioid abuse, laryngeal ca, asp pna, tracheostomy.   Clinical Impression   Pt was admitted for the above.  At baseline, he is independent/mod I and he now needs min guard assistance. Will follow in acute setting with mod I level goals    Follow Up Recommendations  Supervision/Assistance - 24 hour    Equipment Recommendations  None recommended by OT    Recommendations for Other Services       Precautions / Restrictions Precautions Precautions: Fall Restrictions Weight Bearing Restrictions: No      Mobility Bed Mobility Overal bed mobility: Independent                Transfers Overall transfer level: Modified independent                    Balance             Standing balance-Leahy Scale: Fair                             ADL either performed or assessed with clinical judgement   ADL Overall ADL's : Needs assistance/impaired                                       General ADL Comments: min guard to gather items then he can perform; socks extra time and effort. Dons pretied shoes.  Ambulated in hall (approximately 200' with min guard assist).  Legs looked like they were fatiqued near end, but pt denied. Educated on Furniture conservator/restorer      Pertinent Vitals/Pain Pain Assessment: No/denies pain     Hand Dominance     Extremity/Trunk Assessment Upper Extremity Assessment Upper Extremity Assessment: Overall WFL for tasks assessed           Communication Communication Communication: (stoma from trach; covers to speak)   Cognition Arousal/Alertness: Awake/alert Behavior During Therapy: WFL for tasks assessed/performed                                    General Comments: mostly wfls.  Pt feels he has not been fed via PEG.  RN confirms both she and Meda Coffee have fed him   General Comments       Exercises     Shoulder Instructions      Home Living Family/patient expects to be discharged to:: Private residence Living Arrangements: Children Available Help at Discharge: Family               Bathroom Shower/Tub: Walk-in Corporate treasurer Toilet: Handicapped height     Home Equipment: Bedside commode;Shower seat   Additional Comments: lives with daughter who is there 24/7.        Prior Functioning/Environment Level of Independence: Independent        Comments: Retired Freight forwarder at [employer] List: Decreased activity tolerance;Impaired balance (sitting and/or standing)      OT Treatment/Interventions: Self-care/ADL  training;Energy conservation;Patient/family education;Balance training    OT Goals(Current goals can be found in the care plan section) Acute Rehab OT Goals Patient Stated Goal: home today OT Goal Formulation: With patient Time For Goal Achievement: 07/11/19 Potential to Achieve Goals: Good ADL Goals Pt Will Transfer to Toilet: with modified independence;bedside commode Pt Will Perform Tub/Shower Transfer: Shower transfer;with modified independence Additional ADL Goal #1: pt will gather clothes and complete adl at mod I level  OT Frequency: Min 2X/week   Barriers to D/C:            Co-evaluation              AM-PAC OT "6 Clicks" Daily Activity     Outcome Measure Help from another person eating meals?: None Help from another person taking care of personal grooming?: A Little Help from another person toileting, which includes using toliet, bedpan, or urinal?: A Little Help from another person bathing (including washing, rinsing, drying)?: A Little Help from another person to put on and taking off regular upper body clothing?: A  Little Help from another person to put on and taking off regular lower body clothing?: A Little 6 Click Score: 19   End of Session    Activity Tolerance: Patient tolerated treatment well Patient left: in bed;with call bell/phone within reach;with bed alarm set  OT Visit Diagnosis: Unsteadiness on feet (R26.81)                [id number] OT Time Calculation (min): 18 min Charges:  OT General Charges $OT Visit: 1 Visit OT Evaluation $OT Eval Low Complexity: 1 Low  Rhydian Baldi S, OTR/L Acute Rehabilitation Services 07/04/2019  Aiyannah Fayad 07/04/2019, 9:14 AM

## 2019-07-04 NOTE — Progress Notes (Signed)
Physical Therapy Treatment Patient Details Name: Ronald Ruiz MRN: [id number] DOB: 03-17-1956 Today's Date: 07/04/2019    History of Present Illness 64 yo male admitted with AKI, fall, general weakness. Hx of substance abuse, COPD, herpes, opioid abuse, laryngeal ca, asp pna, tracheostomy.    PT Comments    Patient progressing gradually with PT. Pt continues to be unsteady with gait requiring min assist intermittently to prevent LOB. Pt with decreased hip/ankle flexion during swing phase and knees remain flexed in stance leading to poor foot clearance and toe catching. Pt may benefit from RW training to assess gait with support. Pt educated on seated and functional LE strengthening for bil LE's and reported they felt good for his LE muscles. He will continue to benefit from skilled PT interventions to progress independence with mobility. Acute PT will progress as able.   Follow Up Recommendations  Home health PT(if possible)     Equipment Recommendations  (continuing to assess)    Recommendations for Other Services       Precautions / Restrictions Precautions Precautions: Fall Restrictions Weight Bearing Restrictions: No    Mobility  Bed Mobility Overal bed mobility: Independent             General bed mobility comments: extra time  Transfers Overall transfer level: Modified independent Equipment used: None             General transfer comment: extra time and effort, pt mildly unsteady upon rise but abel to steady self without assist  Ambulation/Gait Ambulation/Gait assistance: Min guard;Min assist Gait Distance (Feet): 320 Feet Assistive device: None Gait Pattern/deviations: Step-to pattern;Step-through pattern;Decreased stride length;Decreased dorsiflexion - right;Decreased dorsiflexion - left;Drifts right/left Gait velocity: decreased   General Gait Details: pt noted to have poor foot clearance bil (Lt>Rt) during swing phase of gait and bil knees  appear to be flexed in stance phase. Pt's Lt foot/shoe caught on ground 1x during gait requiring min assist to steady and prevent LOB. pt drifting Rt.Lt intermittently.   Stairs             Wheelchair Mobility    Modified Rankin (Stroke Patients Only)       Balance Overall balance assessment: Needs assistance   Sitting balance-Leahy Scale: Good     Standing balance support: During functional activity;No upper extremity supported Standing balance-Leahy Scale: Fair           Cognition Arousal/Alertness: Awake/alert Behavior During Therapy: WFL for tasks assessed/performed Overall Cognitive Status: Within Functional Limits for tasks assessed               Exercises General Exercises - Lower Extremity Long Arc Quad: AROM;10 reps;Seated;Both Hip Flexion/Marching: AROM;Both;Seated;10 reps Toe Raises: AROM;10 reps;Seated;Both Other Exercises Other Exercises: Repeated Sit<>Stands: 2x 5 reps with ~30 seconds rest between sets (pt reporting exercises feel good for his legs)    General Comments        Pertinent Vitals/Pain Pain Assessment: No/denies pain    Home Living   Prior Function    PT Goals (current goals can now be found in the care plan section) Acute Rehab PT Goals PT Goal Formulation: With patient Time For Goal Achievement: 07/17/19 Potential to Achieve Goals: Good Progress towards PT goals: Progressing toward goals    Frequency    Min 3X/week      PT Plan Current plan remains appropriate       AM-PAC PT "6 Clicks" Mobility   Outcome Measure  Help needed turning from your back to your side  while in a flat bed without using bedrails?: None Help needed moving from lying on your back to sitting on the side of a flat bed without using bedrails?: None Help needed moving to and from a bed to a chair (including a wheelchair)?: None Help needed standing up from a chair using your arms (e.g., wheelchair or bedside chair)?: A Little Help  needed to walk in hospital room?: A Little Help needed climbing 3-5 steps with a railing? : A Little 6 Click Score: 21    End of Session Equipment Utilized During Treatment: Gait belt Activity Tolerance: Patient tolerated treatment well Patient left: with call bell/phone within reach;in chair;with chair alarm set Nurse Communication: Mobility status PT Visit Diagnosis: History of falling (Z91.81);Difficulty in walking, not elsewhere classified (R26.2);Unsteadiness on feet (R26.81)     Time: [id number] PT Time Calculation (min) (ACUTE ONLY): 20 min  Charges:  $Therapeutic Exercise: 8-22 mins                     Verner Mould, DPT Physical Therapist with [facility] [phone]  07/04/2019 1:46 PM

## 2019-07-23 ENCOUNTER — Inpatient Hospital Stay: Payer: Self-pay | Attending: Hematology

## 2019-07-23 NOTE — Progress Notes (Signed)
Nutrition  Called patient for nutrition follow-up.  No answer.  Left message with call back number.   Analeah Brame B. Folasade Mooty, RD, LDN Registered Dietitian [phone] (pager)   

## 2019-07-29 ENCOUNTER — Other Ambulatory Visit: Payer: Self-pay | Admitting: Pulmonary Disease

## 2019-08-03 ENCOUNTER — Ambulatory Visit: Payer: Self-pay | Attending: Internal Medicine

## 2019-08-03 DIAGNOSIS — Z23 Encounter for immunization: Secondary | ICD-10-CM

## 2019-08-03 NOTE — Progress Notes (Signed)
   Covid-19 Vaccination Clinic  Name:  Ronald Ruiz    MRN: [id number] DOB: 05-Aug-1955  08/03/2019  [name] was observed post Covid-19 immunization for 30 minutes based on pre-vaccination screening without incident. He was provided with Vaccine Information Sheet and instruction to access the V-Safe system.   [name] was instructed to call 911 with any severe reactions post vaccine: Marland Kitchen Difficulty breathing  . Swelling of face and throat  . A fast heartbeat  . A bad rash all over body  . Dizziness and weakness   Immunizations Administered    Name Date Dose VIS Date Route   Pfizer COVID-19 Vaccine 08/03/2019 11:49 AM 0.3 mL 05/03/2019 Intramuscular   Manufacturer: [place]   Lot: [id number]   [place]: [id number]

## 2019-08-27 ENCOUNTER — Ambulatory Visit: Payer: Self-pay | Attending: Internal Medicine

## 2019-08-27 DIAGNOSIS — Z23 Encounter for immunization: Secondary | ICD-10-CM

## 2019-08-27 NOTE — Progress Notes (Signed)
   Covid-19 Vaccination Clinic  Name:  Makay Scheidt    MRN: [id number] DOB: [birth year]  08/27/2019  Mr. Berna was observed post Covid-19 immunization for 15 minutes without incident. He was provided with Vaccine Information Sheet and instruction to access the V-Safe system.   Mr. Vicini was instructed to call 911 with any severe reactions post vaccine: Marland Kitchen Difficulty breathing  . Swelling of face and throat  . A fast heartbeat  . A bad rash all over body  . Dizziness and weakness   Immunizations Administered    Name Date Dose VIS Date Route   Pfizer COVID-19 Vaccine 08/27/2019 12:32 PM 0.3 mL 05/03/2019 Intramuscular   Manufacturer: [place]   Lot: [id number]   [place]: [id number]

## 2019-11-15 ENCOUNTER — Telehealth: Payer: Self-pay | Admitting: Pulmonary Disease

## 2019-11-18 NOTE — Telephone Encounter (Signed)
Rec'd completed paperwork - Fwd to [place] via interoffice mail -pr  °

## 2019-11-29 ENCOUNTER — Inpatient Hospital Stay: Admission: RE | Admit: 2019-11-29 | Payer: Self-pay | Source: Ambulatory Visit

## 2019-11-29 ENCOUNTER — Other Ambulatory Visit: Payer: Self-pay

## 2019-11-29 ENCOUNTER — Ambulatory Visit (INDEPENDENT_AMBULATORY_CARE_PROVIDER_SITE_OTHER)
Admission: RE | Admit: 2019-11-29 | Discharge: 2019-11-29 | Disposition: A | Discharge status: Home / Self Care | Payer: Self-pay | Source: Ambulatory Visit | Attending: Pulmonary Disease | Admitting: Pulmonary Disease

## 2019-11-29 DIAGNOSIS — J449 Chronic obstructive pulmonary disease, unspecified: Secondary | ICD-10-CM

## 2019-12-25 ENCOUNTER — Telehealth: Payer: Self-pay | Admitting: Pulmonary Disease

## 2019-12-25 DIAGNOSIS — R911 Solitary pulmonary nodule: Secondary | ICD-10-CM

## 2019-12-25 NOTE — Telephone Encounter (Signed)
ATC Patient.  LM for Patient to call back. Will continue to follow up.         Elton Sin, LPN  12/28/7577 72:82 AM EDT     ATC Patient. LM to call back.   Elton Sin, LPN  0/10/154 1:53 AM EDT     ATC Patient. LM to call back.   Elton Sin, LPN  11/28/4325 6:14 AM EDT     ATC Patient. LM to call back.    Marshell Garfinkel, MD  12/23/2019 8:16 AM EDT     I tried calling the patient but got his voicemail. Please try to get in touch and let him know that CT scan shows some new spots in the lung that will need further evaluation Please order PET scan

## 2019-12-25 NOTE — Telephone Encounter (Signed)
ATC Patient at [phone], [facility]. ATC Patient at [phone], VM full, unable to leave message.

## 2019-12-26 NOTE — Telephone Encounter (Signed)
ATC Patient.  LM to call back. Will try again later today.

## 2019-12-26 NOTE — Telephone Encounter (Signed)
ATC Patient.  LMTCB. 

## 2019-12-26 NOTE — Telephone Encounter (Signed)
ATC Patient.  LM to call back. 

## 2019-12-27 NOTE — Telephone Encounter (Signed)
ATC Patient.  LMTCB.  ATC Patient's cell # [phone] on DPR, no answer, and VM full. ATC [name], Amado Nash, [phone], listed on DPR.  LM for Sallie to have her Father call office for results.

## 2019-12-30 ENCOUNTER — Telehealth: Payer: Self-pay | Admitting: Pulmonary Disease

## 2019-12-30 ENCOUNTER — Telehealth: Payer: Self-pay | Admitting: Hematology and Oncology

## 2019-12-30 NOTE — Telephone Encounter (Signed)
Patient returned call. Patient made aware of Dr Matilde Bash results and recommendations.  Patient aware of PET scan order being placed.  Anderson Malta, [facility] aware I have spoke with Patient. Nothing further at this time.

## 2019-12-30 NOTE — Telephone Encounter (Signed)
Scheduled per sch msg. Called and left msg  

## 2019-12-30 NOTE — Telephone Encounter (Signed)
Patient is returning phone call. Patient phone number is [phone].

## 2019-12-30 NOTE — Progress Notes (Signed)
Oncology Nurse Navigator Documentation  I called and left a voice mail with Mr. Minus making him aware of his new appointment with Dr. Irene Limbo on 01/01/20 at 11:40 to receive results from his 8/10 PET scan. I left my direct contact information to call me back with any questions or concerns.  Harlow Asa RN, BSN, OCN Head & Neck Oncology Nurse [place] [facility] Phone # [phone]  Fax # [phone]

## 2019-12-30 NOTE — Telephone Encounter (Signed)
Patient returned call. Patient made aware of Dr Matilde Bash results and recommendations.  Patient aware of PET scan order being placed.  Nothing further at this time.

## 2019-12-30 NOTE — Telephone Encounter (Signed)
PET scan order placed ASAP.  I have been unable to speak with Patient.  I have tried multiple times for the past week to contact Patient, but I have only got VM. I called and LM on VM for Anderson Malta [facility], to be aware I have attempted to contact Patient multiple times.

## 2019-12-31 ENCOUNTER — Encounter (HOSPITAL_COMMUNITY)
Admission: RE | Admit: 2019-12-31 | Discharge: 2019-12-31 | Disposition: A | Discharge status: Home / Self Care | Payer: Self-pay | Source: Ambulatory Visit | Attending: Pulmonary Disease | Admitting: Pulmonary Disease

## 2019-12-31 ENCOUNTER — Other Ambulatory Visit: Payer: Self-pay

## 2019-12-31 DIAGNOSIS — R911 Solitary pulmonary nodule: Secondary | ICD-10-CM | POA: Insufficient documentation

## 2019-12-31 LAB — GLUCOSE, CAPILLARY: Glucose-Capillary: 78 mg/dL (ref 70–99)

## 2019-12-31 MED ORDER — FLUDEOXYGLUCOSE F - 18 (FDG) INJECTION
6.6500 | Freq: Once | INTRAVENOUS | Status: AC | PRN
  Administered 2019-12-31: 6.65 via INTRAVENOUS

## 2020-01-01 ENCOUNTER — Other Ambulatory Visit: Payer: Self-pay

## 2020-01-01 ENCOUNTER — Inpatient Hospital Stay: Payer: Self-pay | Attending: Hematology | Admitting: Hematology

## 2020-01-01 ENCOUNTER — Ambulatory Visit: Payer: Self-pay | Admitting: Hematology and Oncology

## 2020-01-01 VITALS — BP 108/73 | HR 78 | Temp 98.9°F | Resp 20 | Wt 120.5 lb

## 2020-01-01 DIAGNOSIS — J449 Chronic obstructive pulmonary disease, unspecified: Secondary | ICD-10-CM | POA: Insufficient documentation

## 2020-01-01 DIAGNOSIS — Z87891 Personal history of nicotine dependence: Secondary | ICD-10-CM | POA: Insufficient documentation

## 2020-01-01 DIAGNOSIS — M21332 Wrist drop, left wrist: Secondary | ICD-10-CM

## 2020-01-01 DIAGNOSIS — R911 Solitary pulmonary nodule: Secondary | ICD-10-CM

## 2020-01-01 DIAGNOSIS — C320 Malignant neoplasm of glottis: Secondary | ICD-10-CM

## 2020-01-01 DIAGNOSIS — C329 Malignant neoplasm of larynx, unspecified: Secondary | ICD-10-CM | POA: Insufficient documentation

## 2020-01-01 DIAGNOSIS — I2510 Atherosclerotic heart disease of native coronary artery without angina pectoris: Secondary | ICD-10-CM | POA: Insufficient documentation
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
## 2020-01-02 ENCOUNTER — Telehealth: Payer: Self-pay | Admitting: Hematology

## 2020-01-02 ENCOUNTER — Encounter: Payer: Self-pay | Admitting: Neurology

## 2020-01-02 ENCOUNTER — Other Ambulatory Visit: Payer: Self-pay | Admitting: Pulmonary Disease

## 2020-01-02 DIAGNOSIS — R911 Solitary pulmonary nodule: Secondary | ICD-10-CM

## 2020-01-02 NOTE — Telephone Encounter (Signed)
Scheduled per 08/11 los, patient has been called and notified. 

## 2020-01-22 ENCOUNTER — Ambulatory Visit
Admission: RE | Admit: 2020-01-22 | Discharge: 2020-01-22 | Disposition: A | Discharge status: Home / Self Care | Payer: 59 | Source: Ambulatory Visit | Attending: Radiation Oncology | Admitting: Radiation Oncology

## 2020-01-22 ENCOUNTER — Other Ambulatory Visit: Payer: Self-pay

## 2020-01-22 VITALS — BP 118/64 | HR 53 | Temp 97.8°F | Resp 18 | Ht 72.0 in | Wt 120.1 lb

## 2020-01-22 DIAGNOSIS — I2510 Atherosclerotic heart disease of native coronary artery without angina pectoris: Secondary | ICD-10-CM | POA: Insufficient documentation

## 2020-01-22 DIAGNOSIS — J439 Emphysema, unspecified: Secondary | ICD-10-CM | POA: Diagnosis not present

## 2020-01-22 DIAGNOSIS — C320 Malignant neoplasm of glottis: Secondary | ICD-10-CM | POA: Diagnosis present

## 2020-01-22 DIAGNOSIS — Z79899 Other long term (current) drug therapy: Secondary | ICD-10-CM | POA: Insufficient documentation

## 2020-01-22 DIAGNOSIS — R161 Splenomegaly, not elsewhere classified: Secondary | ICD-10-CM | POA: Diagnosis not present

## 2020-01-22 DIAGNOSIS — Z923 Personal history of irradiation: Secondary | ICD-10-CM | POA: Diagnosis not present

## 2020-01-22 NOTE — Progress Notes (Signed)
Mr. Tijerina presents for follow up of radiation completed 10/16/2018 to his head, neck, and larynx  Pain issues, if any: Patient denies Using a feeding tube?: Yes--patient reports he's using his PEG tube about 3x day.  Weight changes, if any:  Wt Readings from Last 3 Encounters:  01/22/20 120 lb 2 oz (54.5 kg)  01/01/20 120 lb 8 oz (54.7 kg)  07/03/19 121 lb 4.1 oz (55 kg)   Swallowing issues, if any: Yes--he is still not able to swallow liquids or solids due to unclosed tracheostomy holw Smoking or chewing tobacco? Yes--smokes about 10 cigarettes a day Using fluoride trays daily? N/A Last ENT visit was on: Patient reports he saw Dr. Leta Baptist about 6 weeks ago for F/U. Dr. Martyn Malay scoped his throat, and said everything looked well. Other notable issues, if any: Had F/U with Dr. Sullivan Lone on 01/01/2020:  "-Advised pt that the PET/CT findings do not make it clear if the hypermetabolic nodules in the lungs are: inflammatory nodules from aspiration vs metastatic laryngeal cancer vs new tumor.  -There is concern for ongoing aspiration. Recommend pt continue f/u with Dr. Benjamine Mola. -Pt would prefer to ascertain what the lung nodules are even if it requires a more aggressive w/o. -Advised pt that smoking increases the chances of lung cancer. Continued to recommend smoking cessation.  -Will discuss whether a Lung Bx or a Bronchoscopy is necessary/possible with Dr. Vaughan Browner.  -Will f/u with Dr. Isidore Moos and our nurse navigator -Will refer to Neurology for left-sided wrist drop -Will see back in 4 weeks with labs "  Vitals:   01/22/20 1102  BP: 118/64  Pulse: (!) 53  Resp: 18  Temp: 97.8 F (36.6 C)  SpO2: 100%

## 2020-01-22 NOTE — Progress Notes (Signed)
Oncology Nurse Navigator Documentation  I met with Ronald Ruiz during his follow up appointment with Dr. Isidore Moos today. 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 He will see Dr. Benjamine Mola next in January and Dr. Irene Limbo next week. He was scheduled for an appointment with Dr. Isidore Moos on 04/03/20 for Laryngoscopy. He continues to smoke and was provided with information today to assist him to quit including the 1-800 Quit now information. He knows to contact me if I can assist him in anyway.  Harlow Asa RN, BSN, OCN Head & Neck Oncology Nurse [place] at [facility] Phone # [phone]  Fax # [phone]

## 2020-01-24 ENCOUNTER — Encounter: Payer: Self-pay | Admitting: Radiation Oncology

## 2020-01-24 ENCOUNTER — Other Ambulatory Visit: Payer: Self-pay | Admitting: *Deleted

## 2020-01-24 DIAGNOSIS — C320 Malignant neoplasm of glottis: Secondary | ICD-10-CM
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
## 2020-01-28 ENCOUNTER — Inpatient Hospital Stay (HOSPITAL_BASED_OUTPATIENT_CLINIC_OR_DEPARTMENT_OTHER): Payer: 59 | Admitting: Hematology

## 2020-01-28 ENCOUNTER — Other Ambulatory Visit: Payer: Self-pay

## 2020-01-28 ENCOUNTER — Inpatient Hospital Stay: Payer: 59 | Attending: Hematology

## 2020-01-28 VITALS — BP 106/67 | HR 65 | Temp 97.7°F | Resp 18 | Ht 72.0 in | Wt 123.5 lb

## 2020-01-28 DIAGNOSIS — F1111 Opioid abuse, in remission: Secondary | ICD-10-CM | POA: Insufficient documentation

## 2020-01-28 DIAGNOSIS — J449 Chronic obstructive pulmonary disease, unspecified: Secondary | ICD-10-CM | POA: Diagnosis not present

## 2020-01-28 DIAGNOSIS — R911 Solitary pulmonary nodule: Secondary | ICD-10-CM

## 2020-01-28 DIAGNOSIS — C320 Malignant neoplasm of glottis: Secondary | ICD-10-CM | POA: Diagnosis not present

## 2020-01-28 DIAGNOSIS — F1120 Opioid dependence, uncomplicated: Secondary | ICD-10-CM | POA: Insufficient documentation

## 2020-01-28 DIAGNOSIS — E042 Nontoxic multinodular goiter: Secondary | ICD-10-CM | POA: Insufficient documentation

## 2020-01-28 DIAGNOSIS — Z7951 Long term (current) use of inhaled steroids: Secondary | ICD-10-CM | POA: Diagnosis not present

## 2020-01-28 DIAGNOSIS — I2510 Atherosclerotic heart disease of native coronary artery without angina pectoris: Secondary | ICD-10-CM | POA: Diagnosis not present

## 2020-01-28 DIAGNOSIS — F17210 Nicotine dependence, cigarettes, uncomplicated: Secondary | ICD-10-CM | POA: Insufficient documentation

## 2020-01-28 DIAGNOSIS — M21332 Wrist drop, left wrist: Secondary | ICD-10-CM

## 2020-01-28 DIAGNOSIS — C329 Malignant neoplasm of larynx, unspecified: Secondary | ICD-10-CM | POA: Diagnosis not present

## 2020-01-28 DIAGNOSIS — Z79899 Other long term (current) drug therapy: Secondary | ICD-10-CM | POA: Insufficient documentation

## 2020-01-28 DIAGNOSIS — Z930 Tracheostomy status: Secondary | ICD-10-CM | POA: Diagnosis not present

## 2020-01-28 LAB — CMP (CANCER CENTER ONLY)
ALT: 12 U/L (ref 0–44)
AST: 18 U/L (ref 15–41)
Albumin: 2.8 g/dL — ABNORMAL LOW (ref 3.5–5.0)
Alkaline Phosphatase: 85 U/L (ref 38–126)
Anion gap: 5 (ref 5–15)
BUN: 25 mg/dL — ABNORMAL HIGH (ref 8–23)
CO2: 31 mmol/L (ref 22–32)
Calcium: 9.3 mg/dL (ref 8.9–10.3)
Chloride: 101 mmol/L (ref 98–111)
Creatinine: 0.89 mg/dL (ref 0.61–1.24)
GFR, Est AFR Am: >60 mL/min (ref >60)
GFR, Estimated: >60 mL/min (ref >60)
Glucose, Bld: 127 mg/dL — ABNORMAL HIGH (ref 70–99)
Potassium: 4.4 mmol/L (ref 3.5–5.1)
Sodium: 137 mmol/L (ref 135–145)
Total Bilirubin: 0.4 mg/dL (ref 0.3–1.2)
Total Protein: 7.7 g/dL (ref 6.5–8.1)

## 2020-01-28 LAB — CBC WITH DIFFERENTIAL (CANCER CENTER ONLY)
Abs Immature Granulocytes: 0.01 10*3/uL (ref 0.00–0.07)
Basophils Absolute: 0 10*3/uL (ref 0.0–0.1)
Basophils Relative: 1 %
Eosinophils Absolute: 0.1 10*3/uL (ref 0.0–0.5)
Eosinophils Relative: 1 %
HCT: 35.2 % — ABNORMAL LOW (ref 39.0–52.0)
Hemoglobin: 10.9 g/dL — ABNORMAL LOW (ref 13.0–17.0)
Immature Granulocytes: 0 %
Lymphocytes Relative: 11 %
Lymphs Abs: 0.9 10*3/uL (ref 0.7–4.0)
MCH: 29.7 pg (ref 26.0–34.0)
MCHC: 31 g/dL (ref 30.0–36.0)
MCV: 95.9 fL (ref 80.0–100.0)
Monocytes Absolute: 0.9 10*3/uL (ref 0.1–1.0)
Monocytes Relative: 11 %
Neutro Abs: 6.4 10*3/uL (ref 1.7–7.7)
Neutrophils Relative %: 76 %
Platelet Count: 231 10*3/uL (ref 150–400)
RBC: 3.67 MIL/uL — ABNORMAL LOW (ref 4.22–5.81)
RDW: 15.9 % — ABNORMAL HIGH (ref 11.5–15.5)
WBC Count: 8.3 10*3/uL (ref 4.0–10.5)
nRBC: 0 % (ref 0.0–0.2)

## 2020-01-28 LAB — TSH: TSH: 0.822 u[IU]/mL (ref 0.320–4.118)

## 2020-01-28 LAB — MAGNESIUM: Magnesium: 2.1 mg/dL (ref 1.7–2.4)

## 2020-01-28 LAB — T4, FREE: Free T4: 0.93 ng/dL (ref 0.61–1.12)

## 2020-01-28 LAB — PHOSPHORUS: Phosphorus: 3.7 mg/dL (ref 2.5–4.6)

## 2020-01-30 NOTE — Progress Notes (Signed)
[facility] Clinic Note - Initial Visit   Date: 01/31/20  Ronald Ruiz MRN: [id number] DOB: June 09, 1955   Dear Dr. Irene Limbo:  Thank you for your kind referral of Ronald Ruiz for consultation of left wrist drop. Although his history is well known to you, please allow [place] to reiterate it for the purpose of our medical record. The patient was accompanied to the clinic by self.    History of Present Illness: Ronald Ruiz is a 64 y.o. right-handed male with laryngeal squamous cell cancer (2020), COPD, and history of opoid abuse presenting for evaluation of left wrist drop.  He reports waking up one morning in July with abrupt weakness of the left hand, such that he was unable to extend the wrist or fingers.  He is able to make a first in the left hand, but it feels weak.  He does not recall sleeping in a chair or recliner or positioning the arm resting on any surface. He also reports a localized area of numbness at over the hand.  No radicular pain in the arm, neck pain or shoulder pain. Of note, he has lost about 70lb in the past year. BMI is 16.  Out-side paper records, electronic medical record, and images have been reviewed where available and summarized as:  No results found for: HGBA1C Lab Results  Component Value Date   VITAMINB12 2,025 (H) 08/02/2018   Lab Results  Component Value Date   TSH 0.822 01/28/2020     Past Medical History:  Diagnosis Date  . Bronchitis 03/2018  . COPD (chronic obstructive pulmonary disease) ([place])   . Herpes   . laryngeal ca dx'd 06/2018  . Lung nodule 2020  . Substance abuse ([employer])    opiate addiction, been on methadone for 11 years    Past Surgical History:  Procedure Laterality Date  . DIRECT LARYNGOSCOPY N/A 07/21/2018   Procedure: DIRECT LARYNGOSCOPY, TRACHEOSTOMY, BIOPSY;  Surgeon: Leta Baptist, MD;  Location: [facility];  Service: ENT;  Laterality: N/A;  . IR GASTROSTOMY TUBE MOD SED  08/02/2018      Medications:  Outpatient Encounter Medications as of 01/31/2020  Medication Sig Note  . budesonide (PULMICORT) 0.5 MG/2ML nebulizer solution INHALE CONTENTS OF 1 VIAL VIA NEBULIZER 2 TIMES DAILY   . ipratropium-albuterol (DUONEB) 0.5-2.5 (3) MG/3ML SOLN TAKE 3 MLS BY NEBULIZATION EVERY 6 (SIX) HOURS AS NEEDED.   . methadone (DOLOPHINE) 10 MG/ML solution Take 120 mg by mouth daily.  07/03/2019: Dose verified with [facility] as 120mg  once daily. He has been on this dose since 11/16/18.  Marland Kitchen Nutritional Supplements (FEEDING SUPPLEMENT, OSMOLITE 1.5 CAL,) LIQD Give 410 ml 4 times daily via feeding tube (7 cartons daily needed)   . Nutritional Supplements (PROMOD) LIQD Give 30 mL Promod twice daily via PEG.   . Water For Irrigation, Sterile (FREE WATER) SOLN Place 300 mLs into feeding tube every 6 (six) hours.   Marland Kitchen albuterol (PROVENTIL HFA;VENTOLIN HFA) 108 (90 Base) MCG/ACT inhaler Inhale 1-2 puffs into the lungs every 6 (six) hours as needed for wheezing.     No facility-administered encounter medications on file as of 01/31/2020.    Allergies: No Known Allergies  Family History: Family History  Problem Relation Age of Onset  . Alcohol abuse Father   . Cancer Father   . Cirrhosis Father   . Drug abuse Brother     Social History: Social History   Tobacco Use  . Smoking status: Former Smoker  Packs/day: 2.00    Years: 50.00    Pack years: 100.00    Types: Cigarettes  . Smokeless tobacco: Never Used  . Tobacco comment: 07/06/18 at 10 cigs per day, quit 06/16/18  Vaping Use  . Vaping Use: Former  . Start date: 05/23/2017  . Substances: Nicotine  Substance Use Topics  . Alcohol use: Not Currently  . Drug use: Not Currently    Comment: hx of opiod abuse   Social History   Social History Narrative   Right Handed   Lives in a two story home    Vital Signs:  BP 95/62   Pulse 70   Ht 6' (1.829 m)   Wt 120 lb (54.4 kg)   SpO2 100%   BMI 16.27 kg/m     Neurological Exam: MENTAL STATUS including orientation to time, place, person, recent and remote memory, attention span and concentration, language, and fund of knowledge is normal.  Speech altered due to vocalizing with a trach.  Very thinappearing.   CRANIAL NERVES: II:  No visual field defects.   III-IV-VI: Pupils equal round and reactive.  Normal conjugate, extra-ocular eye movements in all directions of gaze.  No nystagmus.  No ptosis.   VII:  Normal facial symmetry VIII:  Normal hearing and vestibular function.   XI:  Normal shoulder shrug and head rotation.    MOTOR:  Marked generalized loss of muscle bulk throughout with notable forearm atrophy on the left.  No fasciculations or abnormal movements.  No pronator drift.   Upper Extremity:  Right  Left  Deltoid  5/5   5/5   Biceps  5/5   5/5   Triceps  5/5   5/5   Infraspinatus 5/5  5/5  Medial pectoralis 5/5  5/5  Wrist extensors  5/5   0/5   Wrist flexors  5/5   5/5   Finger extensors  5/5   0/5   Finger flexors  5/5   5/5   Dorsal interossei  5/5   5/5   Abductor pollicis  5/5   5/5   Tone (Ashworth scale)  0  0   Lower Extremity:  Right  Left  Hip flexors  5/5   5/5   Hip extensors  5/5   5/5   Adductor 5/5  5/5  Abductor 5/5  5/5  Knee flexors  5/5   5/5   Knee extensors  5/5   5/5   Dorsiflexors  5/5   5/5   Plantarflexors  5/5   5/5   Toe extensors  5/5   5/5   Toe flexors  5/5   5/5   Tone (Ashworth scale)  0  0   MSRs:  Right        Left                  brachioradialis 2+  2+  biceps 2+  2+  triceps 2+  2+  patellar 2+  2+  ankle jerk 2+  2+  Hoffman no  no  plantar response down  down   SENSORY:  Reduced temperature and pin prick over the radial nerve sensory distribution on the left, otherwise sensation intact throughout.  COORDINATION/GAIT: Normal finger-to- nose-finger on the right, impaired on the left.  Finger tapping is slowed on the left due to weakness. Gait narrow based and stable.    IMPRESSION: Left radial neuropathy causing wrist drop, probably due to entrapment at the spiral groove either from malpositioning during  sleep or traumatic injury from his falls.  Other C7/8-innervated muscles are intact making C8 radiculopathy unlikely. With his significant weight loss, there is no protective fat padding around the nerve leaving him at greater risk for nerve injury.  I will check XR of the left humerus to evaluate for fracture  Treatment is supportive.    PLAN/RECOMMENDATIONS:  XR left humerus Check vitamin B12, folate NCS/EMG declined, will reconsier if symptoms get worse Treatment is supportive.  Start occupational therapy  Return to clinic in 4 months  Thank you for allowing me to participate in patient's care.  If I can answer any additional questions, I would be pleased to do so.    Sincerely,    Najae Filsaime K. Posey Pronto, DO

## 2020-01-31 ENCOUNTER — Ambulatory Visit (INDEPENDENT_AMBULATORY_CARE_PROVIDER_SITE_OTHER): Payer: 59 | Admitting: Neurology

## 2020-01-31 ENCOUNTER — Other Ambulatory Visit: Payer: Self-pay

## 2020-01-31 ENCOUNTER — Encounter: Payer: Self-pay | Admitting: Neurology

## 2020-01-31 VITALS — BP 95/62 | HR 70 | Ht 72.0 in | Wt 120.0 lb

## 2020-01-31 DIAGNOSIS — G5632 Lesion of radial nerve, left upper limb: Secondary | ICD-10-CM

## 2020-01-31 NOTE — Patient Instructions (Signed)
Start occupational therapy for left wrist drop  Check labs  Return to clinic in 4 months

## 2020-02-07 ENCOUNTER — Ambulatory Visit: Payer: Self-pay | Admitting: Neurology

## 2020-02-12 ENCOUNTER — Other Ambulatory Visit (INDEPENDENT_AMBULATORY_CARE_PROVIDER_SITE_OTHER): Payer: 59

## 2020-02-12 ENCOUNTER — Other Ambulatory Visit: Payer: Self-pay

## 2020-02-12 DIAGNOSIS — G5632 Lesion of radial nerve, left upper limb: Secondary | ICD-10-CM

## 2020-02-12 LAB — B12 AND FOLATE PANEL
Folate: 18.4 ng/mL (ref >5.9)
Vitamin B-12: >1526 pg/mL — ABNORMAL HIGH (ref 211–911)

## 2020-03-03 ENCOUNTER — Ambulatory Visit (INDEPENDENT_AMBULATORY_CARE_PROVIDER_SITE_OTHER)
Admission: RE | Admit: 2020-03-03 | Discharge: 2020-03-03 | Disposition: A | Discharge status: Home / Self Care | Payer: 59 | Source: Ambulatory Visit | Attending: Pulmonary Disease | Admitting: Pulmonary Disease

## 2020-03-03 ENCOUNTER — Other Ambulatory Visit: Payer: Self-pay

## 2020-03-03 DIAGNOSIS — R911 Solitary pulmonary nodule: Secondary | ICD-10-CM | POA: Diagnosis not present

## 2020-03-11 ENCOUNTER — Encounter: Payer: Self-pay | Admitting: Pulmonary Disease

## 2020-03-11 ENCOUNTER — Ambulatory Visit (INDEPENDENT_AMBULATORY_CARE_PROVIDER_SITE_OTHER): Payer: 59 | Admitting: Pulmonary Disease

## 2020-03-11 ENCOUNTER — Other Ambulatory Visit: Payer: Self-pay

## 2020-03-11 VITALS — BP 106/60 | HR 89 | Temp 97.8°F | Ht 72.0 in | Wt 113.4 lb

## 2020-03-11 DIAGNOSIS — J449 Chronic obstructive pulmonary disease, unspecified: Secondary | ICD-10-CM | POA: Diagnosis not present

## 2020-03-11 DIAGNOSIS — T17908D Unspecified foreign body in respiratory tract, part unspecified causing other injury, subsequent encounter: Secondary | ICD-10-CM

## 2020-03-11 NOTE — Patient Instructions (Addendum)
Your CT scan shows findings consistent with recurrent aspiration We will avoid antibiotics as you do not appear infected now We will make a referral again to Garald Balding, speech pathology for evaluation of aspiration  CT scan with contrast in 6 months Follow-up after CT scan

## 2020-03-11 NOTE — Progress Notes (Signed)
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  Had a PEG tube placement on 07/25/2018. Follows with Dr. Benjamine Mola for trach and Dr. Irene Limbo for cancer management  Received note from ENT, Dr. Benjamine Mola dated 05/31/2019 Tracheostomy was decannulated in the office without any issue.  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  Continues to smoke a pack a day Travel history: No significant travel history Relevant family history: No significant family history of lung disease  Interim history: He has had multiple imaging since prior trying waxing and waning pulmonary infiltrates consistent with recurrent aspiration. Currently has some chest congestion but denies any purulent mucus or fevers or chills  Still has an open tracheal stoma after decannulation earlier this year.  Has a PEG tube in place and is getting all his nutrition through that.  Continues to smoke  Outpatient Encounter Medications as of 03/11/2020  Medication Sig   budesonide (PULMICORT) 0.5 MG/2ML nebulizer solution INHALE CONTENTS OF 1 VIAL VIA NEBULIZER 2 TIMES DAILY   ipratropium-albuterol (DUONEB) 0.5-2.5 (3) MG/3ML SOLN TAKE 3 MLS BY NEBULIZATION EVERY 6 (SIX) HOURS AS NEEDED.   methadone (DOLOPHINE) 10 MG/ML solution Take 120 mg by mouth daily.    Nutritional Supplements (FEEDING SUPPLEMENT, OSMOLITE 1.5 CAL,)  LIQD Give 410 ml 4 times daily via feeding tube (7 cartons daily needed)   Nutritional Supplements (PROMOD) LIQD Give 30 mL Promod twice daily via PEG.   Water For Irrigation, Sterile (FREE WATER) SOLN Place 300 mLs into feeding tube every 6 (six) hours.   albuterol (PROVENTIL HFA;VENTOLIN HFA) 108 (90 Base) MCG/ACT inhaler Inhale 1-2 puffs into the lungs every 6 (six) hours as needed for wheezing.    No facility-administered encounter medications on file as of 03/11/2020.   Physical Exam: Blood pressure 106/60, pulse 89, temperature 97.8 F (36.6 C), temperature source Skin, height 6' (1.829 m), weight 113 lb 6.4 oz (51.4 kg), SpO2 92 %. Gen:      Frail, malnourished HEENT:  EOMI, sclera anicteric Neck:     Open Tracheal stoma Lungs:    Clear to auscultation bilaterally; normal respiratory effort CV:         Regular rate and rhythm; no murmurs Abd:      + bowel sounds; soft, non-tender; no palpable masses, no distension Ext:    No edema; adequate peripheral perfusion Skin:      Warm and dry; no rash Neuro: alert and oriented x 3 Psych: normal mood and affect  Data Reviewed: Imaging: CT chest 07/03/2018-mild centrilobular emphysema, fine nodularity in the lower lobes, middle lobe and lingula.  1.1 irregular elongated nodule in the left lower lobe, 1 cm pulmonary nodule in the right middle lobe.  I have reviewed the images personally.  PET scan 07/17/2018- no significant PET uptake in the right middle lobe and left lower lobe nodule.  Hypermetabolism in the left vocal cord.  Emphysema and bronchial wall thickening  CT chest 10/16/2018-Tree in bud opacity, bronchial wall thickening with mucous plugging.  Lung nodules are stable.    CT chest 04/26/2019-9.2 cm masslike opacity in the right upper and middle lobes with central necrosis.  Additional patchy opacities in the left upper lobe, lingula and lower lobes.  Emphysema.    CT scan 06/10/2019-improvement in right middle lobe opacity with  residual cavitation, worsened right lower lobe opacity. I have reviewed the images personally  CT 12/08/2019-new areas of masslike consolidation in the lower lobes  PET scan 12/31/19-resolution of prior hypermetabolic areas with new hypermetabolic areas of consolidation in the lower lobe.  CT scan 03/03/2020-waxing and waning pulmonary infiltrates consistent with recurrent aspiration I have reviewed the images personally.  FENO 07/06/2018-11   Labs CBC 11/02/2018-WBC 7.3, eos 2% and absolute eosinophil count 146 IgE 07/06/2018- 106 Alpha-1 antitrypsin 07/06/2018-199, PIMM  Assessment:  Recurrent aspiration He has had multiple CT and PET scans showing showed migrating areas of consolidation consistent with ongoing aspiration Will avoid further antibiotics as clinically as he does not appear toxic or infected  He is getting fed through the PEG tube but but I suspect he has oropharyngeal dysphagia and aspiration of secretions.  Options for treatment are poor I will refer him back to speech pathology to see if they can work with him  Follow-up CT in 6 months  Chronic bronchitis, emphysema Likely has COPD based on his presentation and smoking history. Hold off on PFTs as he still has an open trach aperture  Continue Pulmicort, performist nebs.  Active smoker Smoking cessation strongly encouraged.  Prescribe nicotine patches Time spent counseling- 5 mins.  Reassess at return visit  Plan/Recommendations: - Continue performist, Pulmicort - Follow-up CT in 6 months - Referral to speech pathology - Smoking cessation  Marshell Garfinkel MD [facility] 03/11/2020, 11:02 AM  CC: [name], MD

## 2020-04-03 ENCOUNTER — Telehealth: Payer: Self-pay | Admitting: *Deleted

## 2020-04-03 ENCOUNTER — Ambulatory Visit
Admission: RE | Admit: 2020-04-03 | Discharge: 2020-04-03 | Disposition: A | Discharge status: Home / Self Care | Payer: 59 | Source: Ambulatory Visit | Attending: Radiation Oncology | Admitting: Radiation Oncology

## 2020-04-03 NOTE — Telephone Encounter (Signed)
Called patient to ask about rescheduling fu for today, lvm for a return call

## 2020-04-03 NOTE — Telephone Encounter (Signed)
Patient called to reschedule f/u appointment with Dr. Isidore Moos. He said that he did not feel well. Patient has been rescheduled for 04/10/20 @ 10:40 a.m.

## 2020-04-09 ENCOUNTER — Telehealth: Payer: Self-pay

## 2020-04-09 NOTE — Telephone Encounter (Signed)
Left message with call back number to have a test for COVID done prior to new appointment

## 2020-04-10 ENCOUNTER — Other Ambulatory Visit: Payer: Self-pay

## 2020-04-10 ENCOUNTER — Ambulatory Visit
Admission: RE | Admit: 2020-04-10 | Discharge: 2020-04-10 | Disposition: A | Discharge status: Home / Self Care | Payer: 59 | Source: Ambulatory Visit | Attending: Radiation Oncology | Admitting: Radiation Oncology

## 2020-04-10 VITALS — BP 96/59 | HR 56 | Temp 97.8°F | Resp 20 | Ht 72.0 in | Wt 120.2 lb

## 2020-04-10 DIAGNOSIS — Z8521 Personal history of malignant neoplasm of larynx: Secondary | ICD-10-CM | POA: Insufficient documentation

## 2020-04-10 DIAGNOSIS — Z923 Personal history of irradiation: Secondary | ICD-10-CM | POA: Diagnosis not present

## 2020-04-10 DIAGNOSIS — C320 Malignant neoplasm of glottis: Secondary | ICD-10-CM

## 2020-04-10 MED ORDER — LARYNGOSCOPY SOLUTION RAD-ONC
15.0000 mL | Freq: Once | TOPICAL | Status: AC
  Administered 2020-04-10: 15 mL via TOPICAL
  Filled 2020-04-10: qty 15

## 2020-04-10 NOTE — Progress Notes (Signed)
Mr. Saraceni presents for follow up of radiation completed05/26/2020 to his head, neck, and larynx  Pain issues, if any: Patient denies Using a feeding tube?: Yes--gives himself 8 cartons of Osmolite 1.5 a day as well as water flushes. Denies any issues/concerns Weight changes, if any:  Wt Readings from Last 3 Encounters:  04/10/20 120 lb 3.2 oz (54.5 kg)  03/11/20 113 lb 6.4 oz (51.4 kg)  01/31/20 120 lb (54.4 kg)   Swallowing issues, if any: Yes. States he continues to try to eat/drink by mouth, but often struggles with food/drink coming out of tracheostomy. Smoking or chewing tobacco? Smokes ~6-7 cigarettes a day Using fluoride trays daily? N/A Last ENT visit was on: 12/05/2019 Saw Dr. Leta Baptist:   Scheduled to see him again in January 2022  Other notable issues, if any: Patient denies any issues with swelling to jaw/neck. Denies any ear/jaw pain, or difficulty opening his mouth. Reports occasional dry mouth, as well as on-going thick saliva. Reports occasional difficulty sleeping at night, coupled with restlessness during the day. He tripped going up some stairs earlier in the week. Did not sustain any serious injury, but does have abrasions to left arm and forehead  Vitals:   04/10/20 1040  BP: (!) 96/59  Pulse: (!) 56  Resp: 20  Temp: 97.8 F (36.6 C)  SpO2: 97%

## 2020-04-13 ENCOUNTER — Encounter: Payer: Self-pay | Admitting: Radiation Oncology

## 2020-04-13 NOTE — Progress Notes (Signed)
Radiation Oncology         ([phone]) [phone] ________________________________  Name: Ronald Ruiz MRN: [id number]  Date: 04/10/2020  DOB: 12/22/1955  Follow-Up Visit Note, in person  CC: [name], MD  [name], MD  Leta Baptist MD  Diagnosis and Prior Radiotherapy:       ICD-10-CM   1. Malignant neoplasm of glottis ([place])  C32.0 laryngocopy solution for Rad-Onc    Fiberoptic laryngoscopy   Cancer Staging Malignant neoplasm of glottis ([place]) Staging form: Larynx - Glottis, AJCC 8th Edition - Clinical: Stage III (cT3, cN0, cM0) - Signed by Eppie Gibson, MD on 08/20/2018   CHIEF COMPLAINT:    Ronald Ruiz presents for follow up of radiation completed05/26/2020 to his head, neck, and larynx  Pain issues, if any: Patient denies Using a feeding tube?: Yes--gives himself 8 cartons of Osmolite 1.5 a day as well as water flushes. Denies any issues/concerns Weight changes, if any:  Wt Readings from Last 3 Encounters:  04/10/20 120 lb 3.2 oz (54.5 kg)  03/11/20 113 lb 6.4 oz (51.4 kg)  01/31/20 120 lb (54.4 kg)   Swallowing issues, if any: Yes. States he continues to try to eat/drink by mouth, but often struggles with food/drink coming out of tracheostomy. Smoking or chewing tobacco? Smokes ~6-7 cigarettes a day Using fluoride trays daily? N/A Last ENT visit was on: 12/05/2019 Saw Dr. Leta Baptist:   Scheduled to see him again in January 2022  Other notable issues, if any: Patient denies any issues with swelling to jaw/neck. Denies any ear/jaw pain, or difficulty opening his mouth. Reports occasional dry mouth, as well as on-going thick saliva. Reports occasional difficulty sleeping at night, coupled with restlessness during the day. He tripped going up some stairs earlier in the week. Did not sustain any serious injury or LOC, but does have abrasions to left arm and forehead  Vitals:   04/10/20 1040  BP: (!) 96/59  Pulse: (!) 56  Resp: 20  Temp: 97.8 F (36.6 C)   SpO2: 97%    ALLERGIES:  has No Known Allergies.  Meds: Current Outpatient Medications  Medication Sig Dispense Refill  . albuterol (PROVENTIL HFA;VENTOLIN HFA) 108 (90 Base) MCG/ACT inhaler Inhale 1-2 puffs into the lungs every 6 (six) hours as needed for wheezing.     . budesonide (PULMICORT) 0.5 MG/2ML nebulizer solution INHALE CONTENTS OF 1 VIAL VIA NEBULIZER 2 TIMES DAILY 360 mL 5  . ipratropium-albuterol (DUONEB) 0.5-2.5 (3) MG/3ML SOLN TAKE 3 MLS BY NEBULIZATION EVERY 6 (SIX) HOURS AS NEEDED. 150 mL 2  . methadone (DOLOPHINE) 10 MG/ML solution Take 120 mg by mouth daily.     . Nutritional Supplements (FEEDING SUPPLEMENT, OSMOLITE 1.5 CAL,) LIQD Give 410 ml 4 times daily via feeding tube (7 cartons daily needed) 1659 mL 0  . Nutritional Supplements (PROMOD) LIQD Give 30 mL Promod twice daily via PEG. 2 Bottle 2  . Water For Irrigation, Sterile (FREE WATER) SOLN Place 300 mLs into feeding tube every 6 (six) hours. 5000 mL 0   No current facility-administered medications for this encounter.    Physical Findings: The patient is in no acute distress. Patient is alert and oriented. Wt Readings from Last 3 Encounters:  04/10/20 120 lb 3.2 oz (54.5 kg)  03/11/20 113 lb 6.4 oz (51.4 kg)  01/31/20 120 lb (54.4 kg)    height is 6' (1.829 m) and weight is 120 lb 3.2 oz (54.5 kg). His temperature is 97.8 F (36.6 C). His blood  pressure is 96/59 (abnormal) and his pulse is 56 (abnormal). His respiration is 20 and oxygen saturation is 97%. .  General: Alert and oriented, in no acute distress HEENT: Evidence of temporal wasting Neck: No palpable adenopathy in the cervical or supraclavicular neck.  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 Affect is appropriate. Lungs notable for coarse breath sounds bilaterally Heart is regular in rhythm with bradycardic rate    PROCEDURE NOTE: After obtaining consent and anesthetizing the  nasal cavity with topical lidocaine and phenylephrine, the flexible endoscope was introduced and passed through the nasal cavity. No lesions in nasopharynx or pharynx appreciated.  Copious mucous/ secretions pooling in throat, obscuring visibility of hypopharynx, supraglottis and glottis.     Lab Findings: Lab Results  Component Value Date   WBC 8.3 01/28/2020   HGB 10.9 (L) 01/28/2020   HCT 35.2 (L) 01/28/2020   MCV 95.9 01/28/2020   PLT 231 01/28/2020    Lab Results  Component Value Date   TSH 0.822 01/28/2020    Radiographic Findings: No results found.  Impression/Plan:    1) Head and Neck Cancer Status: Exam obscured by secretions, no obvious evidence of disease.  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  I again advised him to try to increase the number of nutritional supplements that he instills daily due to suboptimal weight/muscle wasting.  Wt Readings from Last 3 Encounters:  04/10/20 120 lb 3.2 oz (54.5 kg)  03/11/20 113 lb 6.4 oz (51.4 kg)  01/31/20 120 lb (54.4 kg)    3) Risk Factors: The patient has been educated about risk factors including alcohol and tobacco abuse; they understand that avoidance of alcohol and tobacco is important to prevent recurrences as well as other cancers. Still smoking, he declines nicotine patches today.    4) Swallowing: See #2  5) thyroid function : medical oncology will continue to check annually Lab Results  Component Value Date   TSH 0.822 01/28/2020    6) Waxing and waning pulmonary opacities, favored to be r/t aspiration PNA: followed by Dr Vaughan Browner  7) f/u in January with ENT.  Med onc in March. See me in May 2022. He is pleased with this plan.  On date of service, in total, I spent 30 minutes on this encounter. Patient was seen in person.   _____________________________________   Eppie Gibson, MD

## 2020-06-01 ENCOUNTER — Ambulatory Visit: Payer: 59 | Admitting: Neurology

## 2020-06-23 ENCOUNTER — Emergency Department (HOSPITAL_COMMUNITY): Payer: 59

## 2020-06-23 ENCOUNTER — Inpatient Hospital Stay (HOSPITAL_COMMUNITY)
Admission: EM | Admit: 2020-06-23 | Discharge: 2020-07-10 | DRG: 853 | Disposition: A | Discharge status: Home Health | Payer: 59 | Attending: Student | Admitting: Student

## 2020-06-23 DIAGNOSIS — J189 Pneumonia, unspecified organism: Secondary | ICD-10-CM | POA: Diagnosis present

## 2020-06-23 DIAGNOSIS — R6520 Severe sepsis without septic shock: Secondary | ICD-10-CM

## 2020-06-23 DIAGNOSIS — E43 Unspecified severe protein-calorie malnutrition: Secondary | ICD-10-CM | POA: Diagnosis present

## 2020-06-23 DIAGNOSIS — Z79891 Long term (current) use of opiate analgesic: Secondary | ICD-10-CM

## 2020-06-23 DIAGNOSIS — Z923 Personal history of irradiation: Secondary | ICD-10-CM

## 2020-06-23 DIAGNOSIS — Z681 Body mass index (BMI) 19 or less, adult: Secondary | ICD-10-CM

## 2020-06-23 DIAGNOSIS — R1312 Dysphagia, oropharyngeal phase: Secondary | ICD-10-CM | POA: Diagnosis present

## 2020-06-23 DIAGNOSIS — C320 Malignant neoplasm of glottis: Secondary | ICD-10-CM | POA: Diagnosis present

## 2020-06-23 DIAGNOSIS — L8990 Pressure ulcer of unspecified site, unspecified stage: Secondary | ICD-10-CM | POA: Insufficient documentation

## 2020-06-23 DIAGNOSIS — G894 Chronic pain syndrome: Secondary | ICD-10-CM | POA: Diagnosis present

## 2020-06-23 DIAGNOSIS — Z8781 Personal history of (healed) traumatic fracture: Secondary | ICD-10-CM

## 2020-06-23 DIAGNOSIS — Z89431 Acquired absence of right foot: Secondary | ICD-10-CM

## 2020-06-23 DIAGNOSIS — S92505P Nondisplaced unspecified fracture of left lesser toe(s), subsequent encounter for fracture with malunion: Secondary | ICD-10-CM

## 2020-06-23 DIAGNOSIS — R5381 Other malaise: Secondary | ICD-10-CM | POA: Diagnosis present

## 2020-06-23 DIAGNOSIS — Z87891 Personal history of nicotine dependence: Secondary | ICD-10-CM

## 2020-06-23 DIAGNOSIS — M86172 Other acute osteomyelitis, left ankle and foot: Secondary | ICD-10-CM | POA: Diagnosis present

## 2020-06-23 DIAGNOSIS — A419 Sepsis, unspecified organism: Secondary | ICD-10-CM | POA: Diagnosis not present

## 2020-06-23 DIAGNOSIS — R64 Cachexia: Secondary | ICD-10-CM | POA: Diagnosis present

## 2020-06-23 DIAGNOSIS — D649 Anemia, unspecified: Secondary | ICD-10-CM | POA: Diagnosis present

## 2020-06-23 DIAGNOSIS — Z8521 Personal history of malignant neoplasm of larynx: Secondary | ICD-10-CM

## 2020-06-23 DIAGNOSIS — Z1611 Resistance to penicillins: Secondary | ICD-10-CM | POA: Diagnosis present

## 2020-06-23 DIAGNOSIS — D638 Anemia in other chronic diseases classified elsewhere: Secondary | ICD-10-CM | POA: Diagnosis present

## 2020-06-23 DIAGNOSIS — E860 Dehydration: Secondary | ICD-10-CM | POA: Diagnosis present

## 2020-06-23 DIAGNOSIS — Z931 Gastrostomy status: Secondary | ICD-10-CM

## 2020-06-23 DIAGNOSIS — J150 Pneumonia due to Klebsiella pneumoniae: Secondary | ICD-10-CM | POA: Diagnosis present

## 2020-06-23 DIAGNOSIS — R1310 Dysphagia, unspecified: Secondary | ICD-10-CM

## 2020-06-23 DIAGNOSIS — R52 Pain, unspecified: Secondary | ICD-10-CM

## 2020-06-23 DIAGNOSIS — D509 Iron deficiency anemia, unspecified: Secondary | ICD-10-CM | POA: Diagnosis present

## 2020-06-23 DIAGNOSIS — J9601 Acute respiratory failure with hypoxia: Secondary | ICD-10-CM | POA: Diagnosis present

## 2020-06-23 DIAGNOSIS — Z930 Tracheostomy status: Secondary | ICD-10-CM

## 2020-06-23 DIAGNOSIS — J449 Chronic obstructive pulmonary disease, unspecified: Secondary | ICD-10-CM | POA: Diagnosis present

## 2020-06-23 DIAGNOSIS — R197 Diarrhea, unspecified: Secondary | ICD-10-CM

## 2020-06-23 DIAGNOSIS — Z20822 Contact with and (suspected) exposure to covid-19: Secondary | ICD-10-CM | POA: Diagnosis present

## 2020-06-23 DIAGNOSIS — T17908A Unspecified foreign body in respiratory tract, part unspecified causing other injury, initial encounter: Secondary | ICD-10-CM | POA: Diagnosis present

## 2020-06-23 DIAGNOSIS — J440 Chronic obstructive pulmonary disease with acute lower respiratory infection: Secondary | ICD-10-CM | POA: Diagnosis present

## 2020-06-23 DIAGNOSIS — E876 Hypokalemia: Secondary | ICD-10-CM | POA: Diagnosis present

## 2020-06-23 DIAGNOSIS — R0603 Acute respiratory distress: Secondary | ICD-10-CM

## 2020-06-23 DIAGNOSIS — J9621 Acute and chronic respiratory failure with hypoxia: Secondary | ICD-10-CM | POA: Diagnosis present

## 2020-06-23 DIAGNOSIS — E162 Hypoglycemia, unspecified: Secondary | ICD-10-CM | POA: Diagnosis present

## 2020-06-23 DIAGNOSIS — I959 Hypotension, unspecified: Secondary | ICD-10-CM | POA: Diagnosis present

## 2020-06-23 DIAGNOSIS — M84478A Pathological fracture, left toe(s), initial encounter for fracture: Secondary | ICD-10-CM | POA: Diagnosis present

## 2020-06-23 DIAGNOSIS — L89153 Pressure ulcer of sacral region, stage 3: Secondary | ICD-10-CM | POA: Diagnosis present

## 2020-06-23 DIAGNOSIS — Q742 Other congenital malformations of lower limb(s), including pelvic girdle: Secondary | ICD-10-CM

## 2020-06-23 DIAGNOSIS — E870 Hyperosmolality and hypernatremia: Secondary | ICD-10-CM

## 2020-06-23 HISTORY — DX: Sepsis, unspecified organism: A41.9

## 2020-06-23 HISTORY — DX: Sepsis, unspecified organism: R65.20

## 2020-06-23 HISTORY — DX: Acute respiratory failure with hypoxia: J96.01

## 2020-06-23 HISTORY — DX: Pneumonia, unspecified organism: J18.9

## 2020-06-23 LAB — COMPREHENSIVE METABOLIC PANEL
ALT: 14 U/L (ref 0–44)
AST: 22 U/L (ref 15–41)
Albumin: 2 g/dL — ABNORMAL LOW (ref 3.5–5.0)
Alkaline Phosphatase: 92 U/L (ref 38–126)
Anion gap: 11 (ref 5–15)
BUN: 32 mg/dL — ABNORMAL HIGH (ref 8–23)
CO2: 27 mmol/L (ref 22–32)
Calcium: 8.8 mg/dL — ABNORMAL LOW (ref 8.9–10.3)
Chloride: 105 mmol/L (ref 98–111)
Creatinine, Ser: 0.87 mg/dL (ref 0.61–1.24)
GFR, Estimated: >60 mL/min (ref >60)
Glucose, Bld: 97 mg/dL (ref 70–99)
Potassium: 3.8 mmol/L (ref 3.5–5.1)
Sodium: 143 mmol/L (ref 135–145)
Total Bilirubin: 0.4 mg/dL (ref 0.3–1.2)
Total Protein: 6.9 g/dL (ref 6.5–8.1)

## 2020-06-23 LAB — CBC WITH DIFFERENTIAL/PLATELET
Abs Immature Granulocytes: 0.04 10*3/uL (ref 0.00–0.07)
Basophils Absolute: 0 10*3/uL (ref 0.0–0.1)
Basophils Relative: 0 %
Eosinophils Absolute: 0 10*3/uL (ref 0.0–0.5)
Eosinophils Relative: 0 %
HCT: 31.8 % — ABNORMAL LOW (ref 39.0–52.0)
Hemoglobin: 9.6 g/dL — ABNORMAL LOW (ref 13.0–17.0)
Immature Granulocytes: 0 %
Lymphocytes Relative: 2 %
Lymphs Abs: 0.3 10*3/uL — ABNORMAL LOW (ref 0.7–4.0)
MCH: 26.9 pg (ref 26.0–34.0)
MCHC: 30.2 g/dL (ref 30.0–36.0)
MCV: 89.1 fL (ref 80.0–100.0)
Monocytes Absolute: 0.9 10*3/uL (ref 0.1–1.0)
Monocytes Relative: 7 %
Neutro Abs: 11.3 10*3/uL — ABNORMAL HIGH (ref 1.7–7.7)
Neutrophils Relative %: 91 %
Platelets: 350 10*3/uL (ref 150–400)
RBC: 3.57 MIL/uL — ABNORMAL LOW (ref 4.22–5.81)
RDW: 15.8 % — ABNORMAL HIGH (ref 11.5–15.5)
WBC: 12.5 10*3/uL — ABNORMAL HIGH (ref 4.0–10.5)
nRBC: 0 % (ref 0.0–0.2)

## 2020-06-23 LAB — SARS CORONAVIRUS 2 BY RT PCR (HOSPITAL ORDER, PERFORMED IN ~~LOC~~ HOSPITAL LAB): SARS Coronavirus 2: NEGATIVE

## 2020-06-23 LAB — LACTIC ACID, PLASMA
Lactic Acid, Venous: 2.3 mmol/L (ref 0.5–1.9)
Lactic Acid, Venous: 1.7 mmol/L (ref 0.5–1.9)

## 2020-06-23 LAB — EXPECTORATED SPUTUM ASSESSMENT W GRAM STAIN, RFLX TO RESP C

## 2020-06-23 MED ORDER — VANCOMYCIN HCL 1250 MG/250ML IV SOLN
1250.0000 mg | Freq: Once | INTRAVENOUS | Status: AC
  Administered 2020-06-23: 1250 mg via INTRAVENOUS
  Filled 2020-06-23: qty 250

## 2020-06-23 MED ORDER — LACTATED RINGERS IV SOLN: INTRAVENOUS | Status: AC

## 2020-06-23 MED ORDER — SODIUM CHLORIDE 0.9 % IV BOLUS
1000.0000 mL | Freq: Once | INTRAVENOUS | Status: AC
  Administered 2020-06-23: 1000 mL via INTRAVENOUS

## 2020-06-23 MED ORDER — ALBUTEROL SULFATE (2.5 MG/3ML) 0.083% IN NEBU: 2.5000 mg | INHALATION_SOLUTION | RESPIRATORY_TRACT | Status: DC | PRN

## 2020-06-23 MED ORDER — GUAIFENESIN 100 MG/5ML PO SOLN
15.0000 mL | Freq: Four times a day (QID) | ORAL | Status: DC
  Administered 2020-06-23 – 2020-07-10 (×65): 300 mg
  Filled 2020-06-23 (×3): qty 20
  Filled 2020-06-23: qty 10
  Filled 2020-06-23: qty 30
  Filled 2020-06-23 (×4): qty 20
  Filled 2020-06-23 (×2): qty 10
  Filled 2020-06-23: qty 20
  Filled 2020-06-23: qty 30
  Filled 2020-06-23: qty 100
  Filled 2020-06-23: qty 10
  Filled 2020-06-23 (×10): qty 20
  Filled 2020-06-23 (×2): qty 10
  Filled 2020-06-23: qty 20
  Filled 2020-06-23 (×2): qty 10
  Filled 2020-06-23 (×5): qty 20
  Filled 2020-06-23 (×3): qty 10
  Filled 2020-06-23: qty 20
  Filled 2020-06-23: qty 10
  Filled 2020-06-23 (×8): qty 20
  Filled 2020-06-23: qty 10
  Filled 2020-06-23: qty 20
  Filled 2020-06-23: qty 10
  Filled 2020-06-23: qty 20
  Filled 2020-06-23: qty 10
  Filled 2020-06-23: qty 20
  Filled 2020-06-23 (×2): qty 10
  Filled 2020-06-23 (×4): qty 20
  Filled 2020-06-23 (×3): qty 10
  Filled 2020-06-23: qty 20
  Filled 2020-06-23 (×2): qty 10
  Filled 2020-06-23 (×3): qty 20
  Filled 2020-06-23: qty 10

## 2020-06-23 MED ORDER — ONDANSETRON HCL 4 MG/2ML IJ SOLN
4.0000 mg | Freq: Four times a day (QID) | INTRAMUSCULAR | Status: DC | PRN
  Administered 2020-07-07: 4 mg via INTRAVENOUS
  Filled 2020-06-23: qty 2

## 2020-06-23 MED ORDER — SODIUM CHLORIDE 0.9 % IV SOLN
2.0000 g | INTRAVENOUS | Status: DC
  Administered 2020-06-23 – 2020-06-26 (×4): 2 g via INTRAVENOUS
  Filled 2020-06-23: qty 20
  Filled 2020-06-23: qty 2
  Filled 2020-06-23: qty 20
  Filled 2020-06-23: qty 2

## 2020-06-23 MED ORDER — ONDANSETRON HCL 4 MG PO TABS: 4.0000 mg | ORAL_TABLET | Freq: Four times a day (QID) | ORAL | Status: DC | PRN

## 2020-06-23 MED ORDER — SODIUM CHLORIDE 0.9 % IV SOLN: 500.0000 mg | INTRAVENOUS | Status: DC

## 2020-06-23 MED ORDER — ACETAMINOPHEN 325 MG PO TABS
650.0000 mg | ORAL_TABLET | Freq: Four times a day (QID) | ORAL | Status: DC | PRN
  Administered 2020-06-26: 650 mg via ORAL
  Filled 2020-06-23 (×2): qty 2

## 2020-06-23 MED ORDER — SENNOSIDES-DOCUSATE SODIUM 8.6-50 MG PO TABS: 1.0000 | ORAL_TABLET | Freq: Every evening | ORAL | Status: DC | PRN

## 2020-06-23 MED ORDER — BUDESONIDE 0.5 MG/2ML IN SUSP
0.5000 mg | Freq: Two times a day (BID) | RESPIRATORY_TRACT | Status: DC
  Administered 2020-06-23 – 2020-07-10 (×34): 0.5 mg via RESPIRATORY_TRACT
  Filled 2020-06-23 (×33): qty 2

## 2020-06-23 MED ORDER — SODIUM CHLORIDE 0.9 % IV SOLN
2.0000 g | Freq: Once | INTRAVENOUS | Status: AC
  Administered 2020-06-23: 2 g via INTRAVENOUS
  Filled 2020-06-23: qty 2

## 2020-06-23 MED ORDER — METHYLPREDNISOLONE SODIUM SUCC 125 MG IJ SOLR
80.0000 mg | Freq: Once | INTRAMUSCULAR | Status: AC
  Administered 2020-06-23: 80 mg via INTRAVENOUS
  Filled 2020-06-23: qty 2

## 2020-06-23 MED ORDER — ACETAMINOPHEN 650 MG RE SUPP: 650.0000 mg | Freq: Four times a day (QID) | RECTAL | Status: DC | PRN

## 2020-06-23 MED ORDER — DOXYCYCLINE HYCLATE 100 MG PO TABS
100.0000 mg | ORAL_TABLET | Freq: Two times a day (BID) | ORAL | Status: DC
  Administered 2020-06-23 – 2020-06-27 (×8): 100 mg
  Filled 2020-06-23 (×9): qty 1

## 2020-06-23 MED ORDER — ENOXAPARIN SODIUM 40 MG/0.4ML ~~LOC~~ SOLN
40.0000 mg | Freq: Every day | SUBCUTANEOUS | Status: DC
  Administered 2020-06-23 – 2020-06-27 (×5): 40 mg via SUBCUTANEOUS
  Filled 2020-06-23 (×5): qty 0.4

## 2020-06-23 MED ORDER — METHADONE HCL 10 MG/ML PO CONC
120.0000 mg | Freq: Every day | ORAL | Status: DC
  Administered 2020-06-23 – 2020-07-09 (×17): 120 mg
  Filled 2020-06-23 (×18): qty 12

## 2020-06-23 MED ORDER — FREE WATER
300.0000 mL | Freq: Four times a day (QID) | Status: DC
  Administered 2020-06-23 – 2020-06-24 (×4): 300 mL

## 2020-06-23 MED ORDER — GUAIFENESIN ER 600 MG PO TB12
600.0000 mg | ORAL_TABLET | Freq: Two times a day (BID) | ORAL | Status: DC
  Filled 2020-06-23: qty 1

## 2020-06-23 NOTE — ED Notes (Signed)
Attempted to call report to 3W, but no answer.  

## 2020-06-23 NOTE — ED Notes (Signed)
ED TO INPATIENT HANDOFF REPORT  Name/Age/Gender Ronald Ruiz 65 y.o. male  Code Status    Code Status Orders  (From admission, onward)         Start     Ordered   06/23/20 1923  Full code  Continuous        06/23/20 1926        Code Status History    Date Active Date Inactive Code Status Order ID Comments User Context   07/02/2019 1027 07/04/2019 2018 Full Code [id number]  Reubin [place], MD ED   07/21/2018 2011 08/03/2018 1819 Full Code [id number]  Milagros Loll, MD ED   Advance Care Planning Activity      Home/SNF/Other Home  Chief Complaint Acute respiratory failure with hypoxia ([place]) [J96.01]  Level of Care/Admitting Diagnosis ED Disposition    ED Disposition Condition [place]: [facility] [[id number]]  Level of Care: Med-Surg [16]  Covid Evaluation: Confirmed COVID Negative  Diagnosis: Acute respiratory failure with hypoxia [facility]) [[id number]]  Admitting Physician: Lenore Cordia [[id number]]  Attending Physician: Lenore Cordia [[id number]]       Medical History Past Medical History:  Diagnosis Date  . Bronchitis 03/2018  . COPD (chronic obstructive pulmonary disease) ([place])   . Herpes   . laryngeal ca dx'd 06/2018  . Lung nodule 2020  . Substance abuse ([place])    opiate addiction, been on methadone for 11 years    Allergies No Known Allergies  IV Location/Drains/Wounds Patient Lines/Drains/Airways Status    Active Line/Drains/Airways    Name Placement date Placement time Site Days   Peripheral IV 06/23/20 Anterior;Left Forearm 06/23/20  -  Forearm  less than 1   Peripheral IV 06/23/20 Left Antecubital 06/23/20  1547  Antecubital  less than 1   Incision (Closed) 07/21/18 Neck Other (Comment) 07/21/18  2111  - 703   Nasoenteric Feeding Tube Cortrak - 43 inches 10 Fr. Left nare Documented cm marking at nare/ corner of mouth 68 cm 07/23/18  1634  Left nare  701   Tracheostomy Other (Comment) 06/23/20  1443  -   less than 1          Labs/Imaging Results for orders placed or performed during the hospital encounter of 06/23/20 (from the past 48 hour(s))  Comprehensive metabolic panel     Status: Abnormal   Collection Time: 06/23/20  3:10 PM  Result Value Ref Range   Sodium 143 135 - 145 mmol/L   Potassium 3.8 3.5 - 5.1 mmol/L   Chloride 105 98 - 111 mmol/L   CO2 27 22 - 32 mmol/L   Glucose, Bld 97 70 - 99 mg/dL    Comment: Glucose reference range applies only to samples taken after fasting for at least 8 hours.   BUN 32 (H) 8 - 23 mg/dL   Creatinine, Ser 0.87 0.61 - 1.24 mg/dL   Calcium 8.8 (L) 8.9 - 10.3 mg/dL   Total Protein 6.9 6.5 - 8.1 g/dL   Albumin 2.0 (L) 3.5 - 5.0 g/dL   AST 22 15 - 41 U/L   ALT 14 0 - 44 U/L   Alkaline Phosphatase 92 38 - 126 U/L   Total Bilirubin 0.4 0.3 - 1.2 mg/dL   GFR, Estimated >60 >60 mL/min    Comment: (NOTE) Calculated using the CKD-EPI Creatinine Equation (2021)    Anion gap 11 5 - 15    Comment: Performed at [facility], [address] [place], [place] [address]  CBC with Differential     Status: Abnormal   Collection Time: 06/23/20  3:10 PM  Result Value Ref Range   WBC 12.5 (H) 4.0 - 10.5 K/uL   RBC 3.57 (L) 4.22 - 5.81 MIL/uL   Hemoglobin 9.6 (L) 13.0 - 17.0 g/dL   HCT 31.8 (L) 39.0 - 52.0 %   MCV 89.1 80.0 - 100.0 fL   MCH 26.9 26.0 - 34.0 pg   MCHC 30.2 30.0 - 36.0 g/dL   RDW 15.8 (H) 11.5 - 15.5 %   Platelets 350 150 - 400 K/uL   nRBC 0.0 0.0 - 0.2 %   Neutrophils Relative % 91 %   Neutro Abs 11.3 (H) 1.7 - 7.7 K/uL   Lymphocytes Relative 2 %   Lymphs Abs 0.3 (L) 0.7 - 4.0 K/uL   Monocytes Relative 7 %   Monocytes Absolute 0.9 0.1 - 1.0 K/uL   Eosinophils Relative 0 %   Eosinophils Absolute 0.0 0.0 - 0.5 K/uL   Basophils Relative 0 %   Basophils Absolute 0.0 0.0 - 0.1 K/uL   Immature Granulocytes 0 %   Abs Immature Granulocytes 0.04 0.00 - 0.07 K/uL    Comment: Performed at [facility],  [place] [address], [place], [place] [address]  Lactic acid, plasma     Status: Abnormal   Collection Time: 06/23/20  3:10 PM  Result Value Ref Range   Lactic Acid, Venous 2.3 (HH) 0.5 - 1.9 mmol/L    Comment: CRITICAL RESULT CALLED TO, READ BACK BY AND VERIFIED WITH: S.WEST AT 1748 ON 06/23/20 BY N.THOMPSON Performed at [facility], [place] [address], [place], [place] [address]   SARS Coronavirus 2 by RT PCR (hospital order, performed in [facility] hospital lab) Nasopharyngeal Nasopharyngeal Swab     Status: None   Collection Time: 06/23/20  3:10 PM   Specimen: Nasopharyngeal Swab  Result Value Ref Range   SARS Coronavirus 2 NEGATIVE NEGATIVE    Comment: (NOTE) SARS-CoV-2 target nucleic acids are NOT DETECTED.  The SARS-CoV-2 RNA is generally detectable in upper and lower respiratory specimens during the acute phase of infection. The lowest concentration of SARS-CoV-2 viral copies this assay can detect is 250 copies / mL. A negative result does not preclude SARS-CoV-2 infection and should not be used as the sole basis for treatment or other patient management decisions.  A negative result may occur with improper specimen collection / handling, submission of specimen other than nasopharyngeal swab, presence of viral mutation(s) within the areas targeted by this assay, and inadequate number of viral copies (<250 copies / mL). A negative result must be combined with clinical observations, patient history, and epidemiological information.  Fact Sheet for Patients:   StrictlyIdeas.no  Fact Sheet for Healthcare Providers: BankingDealers.co.za  This test is not yet approved or  cleared by the [place] FDA and has been authorized for detection and/or diagnosis of SARS-CoV-2 by FDA under an Emergency Use Authorization (EUA).  This EUA will remain in effect (meaning this test can be used) for the duration of the COVID-19  declaration under Section 564(b)(1) of the Act, 21 U.S.C. section 360bbb-3(b)(1), unless the authorization is terminated or revoked sooner.  Performed at [facility], [place] [address], [place], [place] [address]   Lactic acid, plasma     Status: None   Collection Time: 06/23/20  6:38 PM  Result Value Ref Range   Lactic Acid, Venous 1.7  0.5 - 1.9 mmol/L    Comment: Performed at [facility], [place] [address], [place], [place] [address]  Culture, sputum-assessment     Status: None   Collection Time: 06/23/20  7:24 PM   Specimen: Expectorated Sputum  Result Value Ref Range   Specimen Description EXPECTORATED SPUTUM    Special Requests NONE    Sputum evaluation      THIS SPECIMEN IS ACCEPTABLE FOR SPUTUM CULTURE Performed at [facility], [place] [address], [place], [place] [address]    Report Status 06/23/2020 FINAL    DG Chest Portable 1 View  Result Date: 06/23/2020 CLINICAL DATA:  Cough.  Shortness of breath. EXAM: PORTABLE CHEST 1 VIEW COMPARISON:  July 02, 2019. FINDINGS: The heart size and mediastinal contours are within normal limits. No pneumothorax is noted. Increased bilateral lung opacities are noted concerning for multifocal pneumonia. Small pleural effusions may be present. The visualized skeletal structures are unremarkable. IMPRESSION: Increased bilateral lung opacities are noted concerning for multifocal pneumonia. Electronically Signed   By: Marijo Conception M.D.   On: 06/23/2020 15:40    Pending Labs Unresulted Labs (From admission, onward)          Start     Ordered   06/24/20 0500  HIV Antibody (routine testing w rflx)  (HIV Antibody (Routine testing w reflex) panel)  Tomorrow morning,   R        06/23/20 1926   06/24/20 0500  Procalcitonin  Tomorrow morning,   R        06/23/20 1926   06/24/20 6761  Basic metabolic panel  Tomorrow morning,   R        06/23/20 1926   06/24/20 0500  CBC  Tomorrow morning,   R         06/23/20 1926   06/23/20 1926  Strep pneumoniae urinary antigen  Once,   STAT        06/23/20 1926   06/23/20 1926  Legionella Pneumophila Serogp 1 Ur Ag  Once,   STAT        06/23/20 1926   06/23/20 1924  Culture, respiratory  Once,   R        06/23/20 1924   06/23/20 1813  Culture, blood (routine x 2)  BLOOD CULTURE X 2,   STAT      06/23/20 1812          Vitals/Pain Today's Vitals   06/23/20 1939 06/23/20 2000 06/23/20 2030 06/23/20 2100  BP:  102/60 (!) 96/53 (!) 95/58  Pulse: 86 86 79 67  Resp: 16 16 16 19   Temp:      TempSrc:      SpO2: 100% 100% 100% 100%  Weight:      Height:      PainSc:        Isolation Precautions No active isolations  Medications Medications  enoxaparin (LOVENOX) injection 40 mg (has no administration in time range)  lactated ringers infusion ( Intravenous New Bag/Given 06/23/20 2022)  cefTRIAXone (ROCEPHIN) 2 g in sodium chloride 0.9 % 100 mL IVPB (has no administration in time range)  acetaminophen (TYLENOL) tablet 650 mg (has no administration in time range)    Or  acetaminophen (TYLENOL) suppository 650 mg (has no administration in time range)  ondansetron (ZOFRAN) tablet 4 mg (has no administration in time range)    Or  ondansetron (ZOFRAN) injection 4 mg (has no administration in time range)  senna-docusate (Senokot-S) tablet 1  tablet (has no administration in time range)  guaiFENesin (MUCINEX) 12 hr tablet 600 mg (has no administration in time range)  albuterol (PROVENTIL) (2.5 MG/3ML) 0.083% nebulizer solution 2.5 mg (has no administration in time range)  budesonide (PULMICORT) nebulizer solution 0.5 mg (0.5 mg Nebulization Given 06/23/20 1939)  methadone (DOLOPHINE) 10 MG/ML solution 120 mg (120 mg Per Tube Given 06/23/20 2039)  free water 300 mL (300 mLs Per Tube Given 06/23/20 2043)  doxycycline (VIBRA-TABS) tablet 100 mg (has no administration in time range)  sodium chloride 0.9 % bolus 1,000 mL (0 mLs Intravenous Stopped 06/23/20  1710)  methylPREDNISolone sodium succinate (SOLU-MEDROL) 125 mg/2 mL injection 80 mg (80 mg Intravenous Given 06/23/20 1527)  sodium chloride 0.9 % bolus 1,000 mL (0 mLs Intravenous Stopped 06/23/20 1920)  ceFEPIme (MAXIPIME) 2 g in sodium chloride 0.9 % 100 mL IVPB (0 g Intravenous Stopped 06/23/20 1920)  vancomycin (VANCOREADY) IVPB 1250 mg/250 mL (0 mg Intravenous Stopped 06/23/20 2055)    Mobility walks

## 2020-06-23 NOTE — Progress Notes (Signed)
A consult was received from an ED physician for vancomycin and cefepime per pharmacy dosing.  The patient's profile has been reviewed for ht/wt/allergies/indication/available labs.   A one time order has been placed for cefepime 2 g IV and vancomycin 1250 mg IV.  Further antibiotics/pharmacy consults should be ordered by admitting physician if indicated.                       Thank you, Efraim Kaufmann, PharmD, BCPS 06/23/2020  6:40 PM

## 2020-06-23 NOTE — ED Triage Notes (Signed)
Pt BIB [facility] from home for shortness of breath and SpO2 in the 80s on RA and in obvious distress. Placed on 15L NRB.A&Ox4 and only able to ambulate halfway to ambulance. A&A updraft done and pt suctioned 3 times, O2 increased to 99%. 20ga LF  BP 110/79 HR 132 @1355  then 104 @1412  post intervention RR 30 SpO2 99% 15L

## 2020-06-23 NOTE — H&P (Addendum)
History and Physical    Ronald Ruiz CZY:[id number] DOB: 01-Jul-1955 DOA: 06/23/2020  PCP: [name], MD  Patient coming from: Home via EMS  I have personally briefly reviewed patient's old medical records in [place]  Chief Complaint: Shortness of breath, hypoxia  HPI: Ronald Ruiz is a 65 y.o. 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  Patient reports new onset shortness of breath beginning 2 days ago which has been progressive. He has chronic cough at baseline which is increased in frequency. He has increased purulent discharge from his stoma with yellow/gray appearance. He denies any subjective fevers, chills, diaphoresis, chest pain, nausea, vomiting, abdominal pain, dysuria. He did not have any relief in his dyspnea with the use of his home inhalers.  EMS were called to his home and on their arrival he was noted to be hypoxic to 80% on room air. Stoma was suctioned and patient was placed on NRB with improvement in SPO2. He was brought to the ED for further evaluation management.  ED Course:  Initial vitals showed BP 87/60, pulse 105, RR 20, temp 97.8 F, SPO2 83% on 10 L O2 via trach collar.  Patient was given saline nebulizer with improved SPO2.  Labs show WBC 12.5, hemoglobin 9.6, platelets 350,000, sodium 143, potassium 3.8, bicarb 27, BUN 32, creatinine 0.87, serum glucose 97, LFTs within normal limits, lactic acid 2.3.  SARS-CoV-2 PCR is negative.  Blood cultures ordered and pending.  Portable chest x-ray shows increased bilateral lung opacities.  Patient was given 2 L normal saline, IV Solu-Medrol 80 mg, and IV vancomycin/cefepime.  The hospitalist service was consulted.  Admit for  further evaluation and management.  Review of Systems: All systems reviewed and are negative except as documented in history of present illness above.   Past Medical History:  Diagnosis Date  . Bronchitis 03/2018  . COPD (chronic obstructive pulmonary disease) ([place])   . Herpes   . laryngeal ca dx'd 06/2018  . Lung nodule 2020  . Substance abuse ([place])    opiate addiction, been on methadone for 11 years    Past Surgical History:  Procedure Laterality Date  . DIRECT LARYNGOSCOPY N/A 07/21/2018   Procedure: DIRECT LARYNGOSCOPY, TRACHEOSTOMY, BIOPSY;  Surgeon: Leta Baptist, MD;  Location: [facility];  Service: ENT;  Laterality: N/A;  . IR GASTROSTOMY TUBE MOD SED  08/02/2018    Social History:  reports that he has quit smoking. His smoking use included cigarettes. He has a 100.00 pack-year smoking history. He has never used smokeless tobacco. He reports previous alcohol use. He reports previous drug use.  No Known Allergies  Family History  Problem Relation Age of Onset  . Alcohol abuse Father   . Cancer Father   . Cirrhosis Father   . Drug abuse Brother      Prior to Admission medications   Medication Sig Start Date End Date Taking? Authorizing Provider  albuterol (PROVENTIL HFA;VENTOLIN HFA) 108 (90 Base) MCG/ACT inhaler Inhale 1-2 puffs into the lungs every 6 (six) hours as needed for wheezing.  03/30/18 07/02/19  [provider]  budesonide (PULMICORT) 0.5 MG/2ML nebulizer solution INHALE CONTENTS OF 1 VIAL VIA NEBULIZER 2 TIMES DAILY 10/02/18   Mannam, Praveen, MD  ipratropium-albuterol (DUONEB) 0.5-2.5 (3) MG/3ML SOLN TAKE 3 MLS BY  NEBULIZATION EVERY 6 (SIX) HOURS AS NEEDED. 07/31/19   Mannam, Hart Robinsons, MD  methadone (DOLOPHINE) 10 MG/ML solution Take 120 mg by mouth daily.     [provider]  Nutritional Supplements (FEEDING SUPPLEMENT, OSMOLITE 1.5 CAL,) LIQD Give 410 ml 4 times daily via feeding tube (7 cartons daily needed) 09/25/18   [name], MD   Nutritional Supplements (PROMOD) LIQD Give 30 mL Promod twice daily via PEG. 08/22/18   [name], MD  Water For Irrigation, Sterile (FREE WATER) SOLN Place 300 mLs into feeding tube every 6 (six) hours. 07/04/19   Elodia Florence., MD    Physical Exam: Vitals:   06/23/20 1700 06/23/20 1745 06/23/20 1800 06/23/20 1826  BP: (!) 106/58 94/60 (!) 91/58   Pulse: 96 81 89   Resp: (!) 23 (!) 22 19   Temp:      TempSrc:      SpO2: 100% 100% 99%   Weight:    49.9 kg  Height:    6' (1.829 m)   Constitutional: Disheveled cachectic man resting in bed, NAD, calm, comfortable Eyes: PERRL, lids and conjunctivae normal ENMT: Mucous membranes are dry.  Stoma present with surrounding slight erythematous skin, yellow/gray purulent discharge present. Neck: normal, supple, no masses. Respiratory: Distant breath sounds, clear to auscultation bilaterally, no wheezing, no crackles. Normal respiratory effort. No accessory muscle use.  Cardiovascular: Regular rate and rhythm, no murmurs / rubs / gallops. No extremity edema.  Abdomen: PEG in place.  No tenderness, no masses palpated. No hepatosplenomegaly. Bowel sounds positive.  Musculoskeletal: no clubbing / cyanosis. No joint deformity upper and lower extremities. Good ROM, no contractures. Normal muscle tone.  Skin: Dry flaky skin lower extremities.  Superficial ulcer at plantar base second left toe without erythema or discharge. Neurologic: CN 2-12 grossly intact. Sensation intact. Strength 5/5 in all 4.  Psychiatric: Normal judgment and insight. Alert and oriented x 3. Normal mood.   Labs on Admission: I have personally reviewed following labs and imaging studies  CBC: Recent Labs  Lab 06/23/20 1510  WBC 12.5*  NEUTROABS 11.3*  HGB 9.6*  HCT 31.8*  MCV 89.1  PLT [id number]   Basic Metabolic Panel: Recent Labs  Lab 06/23/20 1510  NA 143  K 3.8  CL 105  CO2 27  GLUCOSE 97  BUN 32*  CREATININE 0.87  CALCIUM 8.8*    GFR: Estimated Creatinine Clearance: 60.5 mL/min (by C-G formula based on SCr of 0.87 mg/dL). Liver Function Tests: Recent Labs  Lab 06/23/20 1510  AST 22  ALT 14  ALKPHOS 92  BILITOT 0.4  PROT 6.9  ALBUMIN 2.0*   No results for input(s): LIPASE, AMYLASE in the last 168 hours. No results for input(s): AMMONIA in the last 168 hours. Coagulation Profile: No results for input(s): INR, PROTIME in the last 168 hours. Cardiac Enzymes: No results for input(s): CKTOTAL, CKMB, CKMBINDEX, TROPONINI in the last 168 hours. BNP (last 3 results) No results for input(s): PROBNP in the last 8760 hours. HbA1C: No results for input(s): HGBA1C in the last 72 hours. CBG: No results for input(s): GLUCAP in the last 168 hours. Lipid Profile: No results for input(s): CHOL, HDL, LDLCALC, TRIG, CHOLHDL, LDLDIRECT in the last 72 hours. Thyroid Function Tests: No results for input(s): TSH, T4TOTAL, FREET4, T3FREE, THYROIDAB in the last 72 hours. Anemia Panel: No results for input(s): VITAMINB12, FOLATE, FERRITIN, TIBC, IRON, RETICCTPCT in the last 72 hours. Urine analysis:    Component Value Date/Time  COLORURINE YELLOW 07/02/2019 0827   APPEARANCEUR HAZY (A) 07/02/2019 0827   LABSPEC 1.014 07/02/2019 0827   PHURINE 5.0 07/02/2019 0827   GLUCOSEU NEGATIVE 07/02/2019 0827   HGBUR NEGATIVE 07/02/2019 0827   BILIRUBINUR NEGATIVE 07/02/2019 0827   KETONESUR NEGATIVE 07/02/2019 0827   PROTEINUR NEGATIVE 07/02/2019 0827   NITRITE NEGATIVE 07/02/2019 0827   LEUKOCYTESUR TRACE (A) 07/02/2019 0827    Radiological Exams on Admission: DG Chest Portable 1 View  Result Date: 06/23/2020 CLINICAL DATA:  Cough.  Shortness of breath. EXAM: PORTABLE CHEST 1 VIEW COMPARISON:  July 02, 2019. FINDINGS: The heart size and mediastinal contours are within normal limits. No pneumothorax is noted. Increased bilateral lung opacities are noted concerning for multifocal pneumonia. Small pleural effusions may be  present. The visualized skeletal structures are unremarkable. IMPRESSION: Increased bilateral lung opacities are noted concerning for multifocal pneumonia. Electronically Signed   By: Marijo Conception M.D.   On: 06/23/2020 15:40    EKG: Not performed.  Assessment/Plan Principal Problem:   Severe sepsis (HCC) Active Problems:   Chronic pulmonary aspiration   Acute respiratory failure with hypoxia ([place])   Community acquired pneumonia  Ronald Ruiz is a 65 y.o. 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  Severe sepsis and acute respiratory failure with hypoxia due to community-acquired pneumonia: Patient hypoxic with SPO2 83% on 10 L O2 via trach collar on arrival. Also noted to be tachycardic with pulse up to 105, tachypneic with RR up to 23, with leukocytosis and elevated lactic acid 2.3. CXR with evidence of multifocal pneumonia, increased when compared to prior imaging. Likely bacterial community-acquired pneumonia with some component of chronic aspiration. SARS-CoV-2 PCR is negative. -Continue ceftriaxone and doxycycline -Follow blood cultures, check sputum culture, strep pneumonia and Legionella urinary antigens -Continue stoma care with frequent suctioning -Continue IV fluid hydration overnight  COPD: Stable without active wheezing on admission. Continue Pulmicort and albuterol as needed. Hold further steroids.  History of throat cancer s/p resection and tracheostomy, since decannulated Oropharyngeal dysphagia with chronic pulmonary aspiration now PEG dependent: Keep n.p.o. with nutrition and meds per tube. Continue stoma and PEG tube care. Consult to  dietitian.  Anemia: Stable without obvious bleeding. Continue to monitor.  DVT prophylaxis: Lovenox Code Status: Full code, confirmed with patient Family Communication: Discussed with patient's daughter at bedside Disposition Plan: From home and likely discharge to home pending clinical improvement Consults called: None Level of care: Med-Surg Admission status:  Status is: Observation  The patient remains OBS appropriate and will d/c before 2 midnights.  Dispo: The patient is from: Home              Anticipated d/c is to: Home              Anticipated d/c date is: 1 day              Patient currently is not medically stable to d/c.   Difficult to place patient No   Zada Finders MD [facility]  If 7PM-7AM, please contact night-coverage [email or web address]  06/23/2020, 7:19 PM

## 2020-06-23 NOTE — ED Notes (Signed)
Attempted to call report to 3W. Primary RN states she will call back.

## 2020-06-23 NOTE — ED Provider Notes (Signed)
[facility] DEPT Provider Note   CSN: [id number] Arrival date & time: 06/23/20  1431     History Chief Complaint  Patient presents with  . Shortness of Breath    stoma    Ronald Ruiz is a 65 y.o. 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 Most of the history is provided by EMS who was called out for SOB. Patient was found on the side of his bed in distress with O2 sats of 80% on RA.  Patient was escorted to the ambulance where he had secretions around his trachea stoma.  At that time, his respiration rate improved and his O2 sats improved to 98% on room air.  Patient reports feeling better.  He states he has had some increasing cough for the last couple days.  He coughs at baseline so he does not know how much worse than normal days.  He states he has been coughing up some phlegm.  He has history of COPD and has inhalers which he has been using.  He has not had any fevers, chest pain, abdominal pain, nausea/vomiting.  He does report that he has been vaccinated for COVID x3.  He does report that his daughter was Covid +2 weeks ago. He has been around her.   The history is provided by the patient.       Past Medical History:  Diagnosis Date  . Bronchitis 03/2018  . COPD (chronic obstructive pulmonary disease) ([place])   . Herpes   . laryngeal ca dx'd 06/2018  . Lung nodule 2020  . Substance abuse ([place])    opiate addiction, been on methadone for 11 years    Patient Active Problem List   Diagnosis Date Noted  . Acute respiratory failure with hypoxia ([place]) 06/23/2020  . Community acquired pneumonia 06/23/2020  . AKI (acute kidney injury) ([place]) 07/02/2019  . COPD (chronic obstructive pulmonary disease) ([place])   . Transaminitis   . Normocytic anemia   . Chronic pulmonary aspiration   .  History of throat cancer   . Aspiration into airway 06/18/2019  . Dehydration 12/03/2018  . Counseling regarding advance care planning and goals of care 08/23/2018  . COPD with chronic bronchitis ([place]) 07/31/2018  . Dysphagia 07/31/2018  . Laryngeal mass 07/31/2018  . Substance abuse ([employer]) 07/31/2018  . Protein-calorie malnutrition, severe 07/23/2018  . Malignant neoplasm of glottis ([place]) 07/21/2018    Past Surgical History:  Procedure Laterality Date  . DIRECT LARYNGOSCOPY N/A 07/21/2018   Procedure: DIRECT LARYNGOSCOPY, TRACHEOSTOMY, BIOPSY;  Surgeon: Leta Baptist, MD;  Location: [facility];  Service: ENT;  Laterality: N/A;  . IR GASTROSTOMY TUBE MOD SED  08/02/2018       Family History  Problem Relation Age of Onset  . Alcohol abuse Father   . Cancer Father   . Cirrhosis Father   . Drug abuse Brother     Social History   Tobacco Use  . Smoking status: Former Smoker    Packs/day: 2.00    Years: 50.00    Pack years: 100.00    Types: Cigarettes  . Smokeless tobacco: Never Used  . Tobacco comment: 07/06/18 at 10 cigs per day, quit 06/16/18  Vaping Use  . Vaping Use: Former  . Start date: 05/23/2017  . Substances: Nicotine  Substance Use Topics  .  Alcohol use: Not Currently  . Drug use: Not Currently    Comment: hx of opiod abuse    Home Medications Prior to Admission medications   Medication Sig Start Date End Date Taking? Authorizing Provider  albuterol (PROVENTIL HFA;VENTOLIN HFA) 108 (90 Base) MCG/ACT inhaler Inhale 1-2 puffs into the lungs every 6 (six) hours as needed for wheezing.  03/30/18 07/02/19  [provider]  budesonide (PULMICORT) 0.5 MG/2ML nebulizer solution INHALE CONTENTS OF 1 VIAL VIA NEBULIZER 2 TIMES DAILY 10/02/18   Mannam, Praveen, MD  ipratropium-albuterol (DUONEB) 0.5-2.5 (3) MG/3ML SOLN TAKE 3 MLS BY NEBULIZATION EVERY 6 (SIX) HOURS AS NEEDED. 07/31/19   Mannam, Praveen, MD  methadone (DOLOPHINE) 10 MG/ML solution Take 120 mg by mouth daily.      [provider]  Nutritional Supplements (FEEDING SUPPLEMENT, OSMOLITE 1.5 CAL,) LIQD Give 410 ml 4 times daily via feeding tube (7 cartons daily needed) 09/25/18   [name], MD  Nutritional Supplements (PROMOD) LIQD Give 30 mL Promod twice daily via PEG. 08/22/18   [name], MD  Water For Irrigation, Sterile (FREE WATER) SOLN Place 300 mLs into feeding tube every 6 (six) hours. 07/04/19   Elodia Florence., MD    Allergies    Patient has no known allergies.  Review of Systems   Review of Systems  Constitutional: Negative for fever.  Respiratory: Positive for cough and shortness of breath.   Cardiovascular: Negative for chest pain.  Gastrointestinal: Negative for abdominal pain, nausea and vomiting.  Genitourinary: Negative for dysuria and hematuria.  Neurological: Negative for headaches.  All other systems reviewed and are negative.   Physical Exam Updated Vital Signs BP (!) 91/58   Pulse 89   Temp 97.8 F (36.6 C) (Oral)   Resp 19   SpO2 99%   Physical Exam Vitals and nursing note reviewed.  Constitutional:      Appearance: He is underweight and well-nourished.  HENT:     Head: Normocephalic and atraumatic.      Comments: Open trach stoma with minimal secretions.    Mouth/Throat:     Mouth: Oropharynx is clear and moist and mucous membranes are normal.  Eyes:     General: Lids are normal.     Extraocular Movements: EOM normal.     Conjunctiva/sclera: Conjunctivae normal.     Pupils: Pupils are equal, round, and reactive to light.  Cardiovascular:     Rate and Rhythm: Normal rate and regular rhythm.     Pulses: Normal pulses.     Heart sounds: Normal heart sounds. No murmur heard. No friction rub. No gallop.   Pulmonary:     Breath sounds: Rhonchi and rales present.     Comments: Rhonchorous breath sounds noted diffusely.  He has some rales noted at the bases. Speaking in short sentences.  Abdominal:     Palpations: Abdomen is  soft. Abdomen is not rigid.     Tenderness: There is no abdominal tenderness. There is no guarding.  Musculoskeletal:        General: Normal range of motion.     Cervical back: Full passive range of motion without pain.  Skin:    General: Skin is warm and dry.     Capillary Refill: Capillary refill takes less than 2 seconds.  Neurological:     Mental Status: He is alert and oriented to person, place, and time.     Motor: Atrophy present.  Psychiatric:  Mood and Affect: Mood and affect normal.        Speech: Speech normal.     ED Results / Procedures / Treatments   Labs (all labs ordered are listed, but only abnormal results are displayed) Labs Reviewed  COMPREHENSIVE METABOLIC PANEL - Abnormal; Notable for the following components:      Result Value   BUN 32 (*)    Calcium 8.8 (*)    Albumin 2.0 (*)    All other components within normal limits  CBC WITH DIFFERENTIAL/PLATELET - Abnormal; Notable for the following components:   WBC 12.5 (*)    RBC 3.57 (*)    Hemoglobin 9.6 (*)    HCT 31.8 (*)    RDW 15.8 (*)    Neutro Abs 11.3 (*)    Lymphs Abs 0.3 (*)    All other components within normal limits  LACTIC ACID, PLASMA - Abnormal; Notable for the following components:   Lactic Acid, Venous 2.3 (*)    All other components within normal limits  SARS CORONAVIRUS 2 BY RT PCR (HOSPITAL ORDER, [facility])  CULTURE, BLOOD (ROUTINE X 2)  CULTURE, BLOOD (ROUTINE X 2)  LACTIC ACID, PLASMA    EKG None  Radiology DG Chest Portable 1 View  Result Date: 06/23/2020 CLINICAL DATA:  Cough.  Shortness of breath. EXAM: PORTABLE CHEST 1 VIEW COMPARISON:  July 02, 2019. FINDINGS: The heart size and mediastinal contours are within normal limits. No pneumothorax is noted. Increased bilateral lung opacities are noted concerning for multifocal pneumonia. Small pleural effusions may be present. The visualized skeletal structures are unremarkable. IMPRESSION:  Increased bilateral lung opacities are noted concerning for multifocal pneumonia. Electronically Signed   By: Marijo Conception M.D.   On: 06/23/2020 15:40    Procedures Procedures   Medications Ordered in ED Medications  sodium chloride 0.9 % bolus 1,000 mL (has no administration in time range)  ceFEPIme (MAXIPIME) 2 g in sodium chloride 0.9 % 100 mL IVPB (has no administration in time range)  sodium chloride 0.9 % bolus 1,000 mL (0 mLs Intravenous Stopped 06/23/20 1710)  methylPREDNISolone sodium succinate (SOLU-MEDROL) 125 mg/2 mL injection 80 mg (80 mg Intravenous Given 06/23/20 1527)    ED Course  I have reviewed the triage vital signs and the nursing notes.  Pertinent labs & imaging results that were available during my care of the patient were reviewed by me and considered in my medical decision making (see chart for details).    MDM Rules/Calculators/A&P                          65 year old male with past medical history of malignant neoplasm of the glottis, COPD, open trach stoma who presents for evaluation of cough, shortness of breath.  He states is been ongoing for couple days.  EMS got there he was initially satting at 80% on room air with obvious distress.  They suctioned out the secretions from his stoma and had improvement.  Patient reports feeling better.  He does report he has been vaccinated for COVID x3.  He has been around his daughter who has been Covid positive.  On initial arrival, he is afebrile, slightly tachycardic.  Blood pressure slightly low.  His initial O2 sat was 83 but after we hung him up to saline nebulizer, improved to about 90-92.  Concern for infectious process versus Covid.  Plan to order labs, chest x-ray.  3:12 PM:  Attempted to call daughter but was unsuccessful  CBC shows leukocytosis of 12.5. Hgb is 9.6. CMP shows normal BUN and Cr. COVID is negative.   CXR shows increased bilateral lung opacities which are concerning for multifocal pneumonia.   BP  has responded to fluids.   Patient with a history of aspiration pneumonia. We will treat with abx. At this time, patient will require admission.   Discussed patient with Dr. Posey Pronto (hospitalis) who accepts patient for admission.   Halden Phegley was evaluated in Emergency Department on 06/23/2020 for the symptoms described in the history of present illness. He was evaluated in the context of the global COVID-19 pandemic, which necessitated consideration that the patient might be at risk for infection with the SARS-CoV-2 virus that causes COVID-19. Institutional protocols and algorithms that pertain to the evaluation of patients at risk for COVID-19 are in a state of rapid change based on information released by regulatory bodies including the CDC and federal and state organizations. These policies and algorithms were followed during the patient's care in the ED.  Portions of this note were generated with [employer]. Dictation errors may occur despite best attempts at proofreading.   Final Clinical Impression(s) / ED Diagnoses Final diagnoses:  Community acquired pneumonia, unspecified laterality  Respiratory distress    Rx / DC Orders ED Discharge Orders    None       Volanda Napoleon, PA-C 06/23/20 1825    Gareth Morgan, MD 06/24/20 1231

## 2020-06-24 ENCOUNTER — Encounter (HOSPITAL_COMMUNITY): Payer: Self-pay | Admitting: Internal Medicine

## 2020-06-24 DIAGNOSIS — I739 Peripheral vascular disease, unspecified: Secondary | ICD-10-CM | POA: Diagnosis not present

## 2020-06-24 DIAGNOSIS — E860 Dehydration: Secondary | ICD-10-CM | POA: Diagnosis present

## 2020-06-24 DIAGNOSIS — E870 Hyperosmolality and hypernatremia: Secondary | ICD-10-CM | POA: Diagnosis present

## 2020-06-24 DIAGNOSIS — J440 Chronic obstructive pulmonary disease with acute lower respiratory infection: Secondary | ICD-10-CM | POA: Diagnosis present

## 2020-06-24 DIAGNOSIS — C320 Malignant neoplasm of glottis: Secondary | ICD-10-CM

## 2020-06-24 DIAGNOSIS — D649 Anemia, unspecified: Secondary | ICD-10-CM

## 2020-06-24 DIAGNOSIS — Z8521 Personal history of malignant neoplasm of larynx: Secondary | ICD-10-CM | POA: Diagnosis not present

## 2020-06-24 DIAGNOSIS — L89153 Pressure ulcer of sacral region, stage 3: Secondary | ICD-10-CM | POA: Diagnosis present

## 2020-06-24 DIAGNOSIS — M86672 Other chronic osteomyelitis, left ankle and foot: Secondary | ICD-10-CM | POA: Diagnosis not present

## 2020-06-24 DIAGNOSIS — R64 Cachexia: Secondary | ICD-10-CM | POA: Diagnosis present

## 2020-06-24 DIAGNOSIS — R0603 Acute respiratory distress: Secondary | ICD-10-CM | POA: Diagnosis present

## 2020-06-24 DIAGNOSIS — J449 Chronic obstructive pulmonary disease, unspecified: Secondary | ICD-10-CM

## 2020-06-24 DIAGNOSIS — E43 Unspecified severe protein-calorie malnutrition: Secondary | ICD-10-CM

## 2020-06-24 DIAGNOSIS — R6520 Severe sepsis without septic shock: Secondary | ICD-10-CM | POA: Diagnosis present

## 2020-06-24 DIAGNOSIS — Z923 Personal history of irradiation: Secondary | ICD-10-CM | POA: Diagnosis not present

## 2020-06-24 DIAGNOSIS — R1310 Dysphagia, unspecified: Secondary | ICD-10-CM

## 2020-06-24 DIAGNOSIS — M86172 Other acute osteomyelitis, left ankle and foot: Secondary | ICD-10-CM | POA: Diagnosis present

## 2020-06-24 DIAGNOSIS — D509 Iron deficiency anemia, unspecified: Secondary | ICD-10-CM | POA: Diagnosis present

## 2020-06-24 DIAGNOSIS — J9601 Acute respiratory failure with hypoxia: Secondary | ICD-10-CM | POA: Diagnosis not present

## 2020-06-24 DIAGNOSIS — A419 Sepsis, unspecified organism: Secondary | ICD-10-CM | POA: Diagnosis present

## 2020-06-24 DIAGNOSIS — Z1611 Resistance to penicillins: Secondary | ICD-10-CM | POA: Diagnosis present

## 2020-06-24 DIAGNOSIS — E162 Hypoglycemia, unspecified: Secondary | ICD-10-CM | POA: Diagnosis present

## 2020-06-24 DIAGNOSIS — R1312 Dysphagia, oropharyngeal phase: Secondary | ICD-10-CM | POA: Diagnosis present

## 2020-06-24 DIAGNOSIS — J150 Pneumonia due to Klebsiella pneumoniae: Secondary | ICD-10-CM | POA: Diagnosis present

## 2020-06-24 DIAGNOSIS — M84478A Pathological fracture, left toe(s), initial encounter for fracture: Secondary | ICD-10-CM | POA: Diagnosis present

## 2020-06-24 DIAGNOSIS — J189 Pneumonia, unspecified organism: Secondary | ICD-10-CM | POA: Diagnosis not present

## 2020-06-24 DIAGNOSIS — T17908A Unspecified foreign body in respiratory tract, part unspecified causing other injury, initial encounter: Secondary | ICD-10-CM | POA: Diagnosis not present

## 2020-06-24 DIAGNOSIS — Z681 Body mass index (BMI) 19 or less, adult: Secondary | ICD-10-CM | POA: Diagnosis not present

## 2020-06-24 DIAGNOSIS — Z9889 Other specified postprocedural states: Secondary | ICD-10-CM | POA: Diagnosis not present

## 2020-06-24 DIAGNOSIS — Z87891 Personal history of nicotine dependence: Secondary | ICD-10-CM | POA: Diagnosis not present

## 2020-06-24 DIAGNOSIS — Z20822 Contact with and (suspected) exposure to covid-19: Secondary | ICD-10-CM | POA: Diagnosis present

## 2020-06-24 DIAGNOSIS — E876 Hypokalemia: Secondary | ICD-10-CM | POA: Diagnosis present

## 2020-06-24 DIAGNOSIS — J9621 Acute and chronic respiratory failure with hypoxia: Secondary | ICD-10-CM | POA: Diagnosis present

## 2020-06-24 DIAGNOSIS — D638 Anemia in other chronic diseases classified elsewhere: Secondary | ICD-10-CM | POA: Diagnosis present

## 2020-06-24 LAB — GLUCOSE, CAPILLARY: Glucose-Capillary: 82 mg/dL (ref 70–99)

## 2020-06-24 LAB — BASIC METABOLIC PANEL
Anion gap: 12 (ref 5–15)
BUN: 36 mg/dL — ABNORMAL HIGH (ref 8–23)
CO2: 26 mmol/L (ref 22–32)
Calcium: 8.4 mg/dL — ABNORMAL LOW (ref 8.9–10.3)
Chloride: 104 mmol/L (ref 98–111)
Creatinine, Ser: 0.81 mg/dL (ref 0.61–1.24)
GFR, Estimated: >60 mL/min (ref >60)
Glucose, Bld: 91 mg/dL (ref 70–99)
Potassium: 4 mmol/L (ref 3.5–5.1)
Sodium: 142 mmol/L (ref 135–145)

## 2020-06-24 LAB — PROCALCITONIN: Procalcitonin: 0.71 ng/mL

## 2020-06-24 LAB — CBC
HCT: 27.9 % — ABNORMAL LOW (ref 39.0–52.0)
Hemoglobin: 8.5 g/dL — ABNORMAL LOW (ref 13.0–17.0)
MCH: 26.9 pg (ref 26.0–34.0)
MCHC: 30.5 g/dL (ref 30.0–36.0)
MCV: 88.3 fL (ref 80.0–100.0)
Platelets: 289 10*3/uL (ref 150–400)
RBC: 3.16 MIL/uL — ABNORMAL LOW (ref 4.22–5.81)
RDW: 15.6 % — ABNORMAL HIGH (ref 11.5–15.5)
WBC: 9.5 10*3/uL (ref 4.0–10.5)
nRBC: 0 % (ref 0.0–0.2)

## 2020-06-24 LAB — MAGNESIUM: Magnesium: 2 mg/dL (ref 1.7–2.4)

## 2020-06-24 LAB — PHOSPHORUS: Phosphorus: 5.1 mg/dL — ABNORMAL HIGH (ref 2.5–4.6)

## 2020-06-24 LAB — HIV ANTIBODY (ROUTINE TESTING W REFLEX): HIV Screen 4th Generation wRfx: NONREACTIVE

## 2020-06-24 MED ORDER — SODIUM CHLORIDE 0.9 % IV BOLUS
1000.0000 mL | Freq: Once | INTRAVENOUS | Status: AC
  Administered 2020-06-24: 1000 mL via INTRAVENOUS

## 2020-06-24 MED ORDER — PANTOPRAZOLE SODIUM 40 MG PO PACK
40.0000 mg | PACK | Freq: Every day | ORAL | Status: DC
  Administered 2020-06-24 – 2020-07-09 (×15): 40 mg
  Filled 2020-06-24 (×21): qty 20

## 2020-06-24 MED ORDER — ALBUMIN HUMAN 25 % IV SOLN
25.0000 g | Freq: Four times a day (QID) | INTRAVENOUS | Status: AC
  Administered 2020-06-24 – 2020-06-25 (×4): 25 g via INTRAVENOUS
  Filled 2020-06-24 (×4): qty 100

## 2020-06-24 MED ORDER — SODIUM CHLORIDE 0.9 % IV SOLN: INTRAVENOUS | Status: DC

## 2020-06-24 MED ORDER — FREE WATER
120.0000 mL | Freq: Four times a day (QID) | Status: DC
  Administered 2020-06-24 – 2020-06-26 (×7): 120 mL

## 2020-06-24 MED ORDER — LORATADINE 10 MG PO TABS
10.0000 mg | ORAL_TABLET | Freq: Every day | ORAL | Status: DC
  Administered 2020-06-24 – 2020-07-09 (×16): 10 mg
  Filled 2020-06-24 (×17): qty 1

## 2020-06-24 MED ORDER — VITAL HIGH PROTEIN PO LIQD
1000.0000 mL | ORAL | Status: DC
  Filled 2020-06-24: qty 1000

## 2020-06-24 MED ORDER — OSMOLITE 1.5 CAL PO LIQD
474.0000 mL | Freq: Four times a day (QID) | ORAL | Status: DC
  Administered 2020-06-24 – 2020-06-26 (×6): 474 mL
  Filled 2020-06-24 (×11): qty 474

## 2020-06-24 MED ORDER — IPRATROPIUM-ALBUTEROL 0.5-2.5 (3) MG/3ML IN SOLN
3.0000 mL | Freq: Four times a day (QID) | RESPIRATORY_TRACT | Status: DC
  Administered 2020-06-24 (×3): 3 mL via RESPIRATORY_TRACT
  Filled 2020-06-24 (×3): qty 3

## 2020-06-24 NOTE — Progress Notes (Signed)
PROGRESS NOTE    Ronald Ruiz  [id number] DOB: May 23, 1956 DOA: 06/23/2020 PCP: [name], MD    Chief Complaint  Patient presents with  . 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 Patient noted to be hypotensive with systolic blood pressures in the 80s, tachycardic with a heart rate of 105, sats of 83% on 10 L O2 via trach collar. Patient given some saline nebs with some improvement. Patient also noted to have a leukocytosis. Chest x-ray done concerning for multifocal pneumonia. Patient received a dose of IV Solu-Medrol, placed empirically on IV antibiotics and hospitalist called for admission.   Assessment & Plan:   Principal Problem:   Severe sepsis ([place]) Active Problems:   Acute respiratory failure with hypoxia ([place])   Community acquired pneumonia   Malignant neoplasm of glottis ([facility])   Protein-calorie malnutrition, severe   Dysphagia   COPD (chronic obstructive pulmonary disease) ([facility])   Normocytic anemia   Chronic pulmonary aspiration  1 severe sepsis and acute respiratory failure with hypoxia secondary to multifocal pneumonia Patient noted to be hypoxic on admission with sats of 83% on 10 L O2 via trach collar on arrival. Patient also with a leukocytosis, elevated lactic acid level, tachycardic with heart rate up to 105, chest x-ray with multifocal pneumonia. SARS coronavirus PCR negative. Patient pancultured results pending. Urine strep pneumococcus antigen pending, urine Legionella antigen pending. Sputum Gram stain and cultures pending. Currently on 8 L O2 via trach collar. Patient seems somewhat comfortable. Continue empiric IV Rocephin and  doxycycline. Place on scheduled duo nebs. Pulmicort nebs, PPI, Claritin. Check suctioning. Trach care per RN. Supportive care. Follow.  2. COPD Stable. Patient with no active wheezing however patient presented with acute respiratory failure with hypoxia currently on 8 L trach collar. Continue Pulmicort nebs. Placed on scheduled duo nebs. Follow.  3. Borderline blood pressures/hypotension Patient with systolic blood pressures in the 80s. Patient denies any dizziness. Patient noted to have baseline systolic blood pressures in the low 100s from prior office visits as noted on epic. Patient pancultured. Check a.m. cortisol level patient currently on IV antibiotics. Repeat BP manually. Placed on IV fluids normal saline 125 cc an hour. Give a dose of IV albumin every 6 hours x24 hours. May need stress dose steroids. Follow.  4. History of throat cancer status post resection and tracheostomy, since decannulated/oropharyngeal dysphagia with chronic pulmonary aspiration now PEG dependent Keep n.p.o. Continue stoma and PEG tube care. Dietitian consulted.  5. Severe protein calorie malnutrition Secondary to chronic illness. Dietitian consulted.  6. Anemia Patient with no overt bleeding, hemoglobin trickling down likely due to dilutional effect. Check an anemia panel. Follow H&H. Transfusion threshold hemoglobin < 7.    DVT prophylaxis: Lovenox Code Status: Full Family Communication: Updated patient. No family at bedside. Disposition:   Status is: Inpatient    Dispo: The patient is from:               Anticipated d/c is to: Home              Anticipated d/c date is: 3 to 4 days               Patient currently with multifocal pneumonia, with stoma, systolic blood pressures  in the 80s, on IV antibiotics, IV fluids. Not stable for discharge.   Difficult to place patient undetermined       Consultants:   None  Procedures:   Chest x-ray 06/23/2020  Antimicrobials:   Oral doxycycline to  06/23/2020>>>>  IV cefepime 06/23/2018 22x1 dose  IV Rocephin to 06/23/2020>>>>  IV vancomycin x1 dose   Subjective: Patient sitting up in bed. States he is feeling a little bit better than on admission. States still from stoma decreasing. Denies any chest pain. No significant abdominal pain.  Objective: Vitals:   06/24/20 1435 06/24/20 1436 06/24/20 1443 06/24/20 1706  BP:    (!) 82/49  Pulse:    68  Resp:    18  Temp:    97.7 F (36.5 C)  TempSrc:    Oral  SpO2: 98% (!) 89% 95% 96%  Weight:      Height:        Intake/Output Summary (Last 24 hours) at 06/24/2020 1744 Last data filed at 06/24/2020 0801 Gross per 24 hour  Intake 1350 ml  Output 350 ml  Net 1000 ml   Filed Weights   06/23/20 1826  Weight: 49.9 kg    Examination:  General exam: Cachetic. Frail.  Stoma.  Trach collar. Respiratory system: Decreased breath sounds in the bases.  Fair air movement.  No wheezing.  Cardiovascular system: S1 & S2 heard, RRR. No JVD, murmurs, rubs, gallops or clicks. No pedal edema. Gastrointestinal system: Abdomen is nondistended, soft and nontender.  PEG tube in place.  No organomegaly or masses felt. Normal bowel sounds heard. Central nervous system: Alert and oriented. No focal neurological deficits. Extremities: Symmetric 5 x 5 power. Skin: No rashes, lesions or ulcers Psychiatry: Judgement and insight appear normal. Mood & affect appropriate.     Data Reviewed: I have personally reviewed following labs and imaging studies  CBC: Recent Labs  Lab 06/23/20 1510 06/24/20 0331  WBC 12.5* 9.5  NEUTROABS 11.3*  --   HGB 9.6* 8.5*  HCT 31.8* 27.9*  MCV 89.1 88.3  PLT 350 938    Basic Metabolic Panel: Recent Labs  Lab 06/23/20 1510 06/24/20 0331 06/24/20 1550  NA 143 142  --   K 3.8 4.0  --   CL 105 104  --   CO2 27 26  --   GLUCOSE 97 91  --   BUN 32* 36*  --   CREATININE 0.87 0.81  --   CALCIUM 8.8* 8.4*  --   MG  --   --  2.0  PHOS  --   --  5.1*     GFR: Estimated Creatinine Clearance: 65 mL/min (by C-G formula based on SCr of 0.81 mg/dL).  Liver Function Tests: Recent Labs  Lab 06/23/20 1510  AST 22  ALT 14  ALKPHOS 92  BILITOT 0.4  PROT 6.9  ALBUMIN 2.0*    CBG: No results for input(s): GLUCAP in the last 168 hours.   Recent Results (from the past 240 hour(s))  SARS Coronavirus 2 by RT PCR (hospital order, performed in [facility] hospital lab) Nasopharyngeal Nasopharyngeal Swab     Status: None   Collection Time: 06/23/20  3:10 PM   Specimen: Nasopharyngeal Swab  Result Value Ref Range Status   SARS Coronavirus 2 NEGATIVE NEGATIVE Final    Comment: (NOTE) SARS-CoV-2 target nucleic acids are NOT DETECTED.  The SARS-CoV-2 RNA is generally detectable in upper and lower respiratory specimens during the acute phase of infection. The  lowest concentration of SARS-CoV-2 viral copies this assay can detect is 250 copies / mL. A negative result does not preclude SARS-CoV-2 infection and should not be used as the sole basis for treatment or other patient management decisions.  A negative result may occur with improper specimen collection / handling, submission of specimen other than nasopharyngeal swab, presence of viral mutation(s) within the areas targeted by this assay, and inadequate number of viral copies (<250 copies / mL). A negative result must be combined with clinical observations, patient history, and epidemiological information.  Fact Sheet for Patients:   StrictlyIdeas.no  Fact Sheet for Healthcare Providers: BankingDealers.co.za  This test is not yet approved or  cleared by the [place] FDA and has been authorized for detection and/or diagnosis of SARS-CoV-2 by FDA under an Emergency Use Authorization (EUA).  This EUA will remain in effect (meaning this test can be used) for the duration of the COVID-19 declaration under Section 564(b)(1) of the Act,  21 U.S.C. section 360bbb-3(b)(1), unless the authorization is terminated or revoked sooner.  Performed at [facility], [place] [address], [place], [place] [address]   Culture, blood (routine x 2)     Status: None (Preliminary result)   Collection Time: 06/23/20  6:13 PM   Specimen: BLOOD  Result Value Ref Range Status   Specimen Description   Final    BLOOD LEFT ANTECUBITAL Performed at [facility] [address], [place], [place] [address]    Special Requests   Final    BOTTLES DRAWN AEROBIC AND ANAEROBIC Blood Culture results may not be optimal due to an inadequate volume of blood received in culture bottles Performed at [facility] [address] [place], [place] [address]    Culture   Final    NO GROWTH < 12 HOURS Performed at [facility] [address] [place], [place] [address]    Report Status PENDING  Incomplete  Culture, blood (routine x 2)     Status: None (Preliminary result)   Collection Time: 06/23/20  6:38 PM   Specimen: BLOOD  Result Value Ref Range Status   Specimen Description   Final    BLOOD RIGHT ANTECUBITAL Performed at [facility] [address] [place], [place] [address]    Special Requests   Final    BOTTLES DRAWN AEROBIC AND ANAEROBIC Blood Culture results may not be optimal due to an inadequate volume of blood received in culture bottles Performed at [facility] [address], [place], [place] [address]    Culture   Final    NO GROWTH < 12 HOURS Performed at [facility] [address] [place], [place] [address]    Report Status PENDING  Incomplete  Culture, sputum-assessment     Status: None   Collection Time: 06/23/20  7:24 PM   Specimen: Expectorated Sputum  Result Value Ref Range Status   Specimen Description EXPECTORATED SPUTUM  Final   Special Requests NONE  Final   Sputum evaluation   Final    THIS SPECIMEN IS  ACCEPTABLE FOR SPUTUM CULTURE Performed at [facility], [place] [address] [place], [place] [address]    Report Status 06/23/2020 FINAL  Final  Culture, respiratory     Status: None (Preliminary result)   Collection Time: 06/23/20  7:24 PM  Result Value Ref Range Status   Specimen Description   Final    EXPECTORATED SPUTUM Performed at [facility]  [place] [address], [place], [place] [address]    Special Requests   Final    NONE Reflexed from [id number] Performed at [facility] [address], [place], [place] [address]    Gram Stain   Final    ABUNDANT WBC PRESENT, PREDOMINANTLY PMN ABUNDANT GRAM NEGATIVE RODS MODERATE GRAM POSITIVE RODS FEW GRAM POSITIVE COCCI    Culture   Final    TOO YOUNG TO READ Performed at [facility], [place] [address], [place], [place] [address]    Report Status PENDING  Incomplete         Radiology Studies: DG Chest Portable 1 View  Result Date: 06/23/2020 CLINICAL DATA:  Cough.  Shortness of breath. EXAM: PORTABLE CHEST 1 VIEW COMPARISON:  July 02, 2019. FINDINGS: The heart size and mediastinal contours are within normal limits. No pneumothorax is noted. Increased bilateral lung opacities are noted concerning for multifocal pneumonia. Small pleural effusions may be present. The visualized skeletal structures are unremarkable. IMPRESSION: Increased bilateral lung opacities are noted concerning for multifocal pneumonia. Electronically Signed   By: Marijo Conception M.D.   On: 06/23/2020 15:40        Scheduled Meds: . budesonide  0.5 mg Nebulization BID  . doxycycline  100 mg Per Tube Q12H  . enoxaparin (LOVENOX) injection  40 mg Subcutaneous QHS  . feeding supplement (OSMOLITE 1.5 CAL)  474 mL Per Tube QID  . free water  120 mL Per Tube QID  . guaiFENesin  15 mL Per Tube Q6H  . ipratropium-albuterol  3 mL Nebulization Q6H  . loratadine  10 mg Per Tube Daily  . methadone  120 mg Per  Tube Daily  . pantoprazole sodium  40 mg Per Tube Daily   Continuous Infusions: . sodium chloride 125 mL/hr at 06/24/20 1523  . cefTRIAXone (ROCEPHIN)  IV 2 g (06/23/20 2250)     LOS: 0 days    Time spent: 35 minutes    Irine Seal, MD [facility]   To contact the attending provider between 7A-7P or the covering provider during after hours 7P-7A, please log into the web site [email or web address] and access using universal [place] password for that web site. If you do not have the password, please call the hospital operator.  06/24/2020, 5:44 PM

## 2020-06-24 NOTE — Progress Notes (Addendum)
Initial Nutrition Assessment  DOCUMENTATION CODES:   Severe malnutrition in context of chronic illness,Underweight  INTERVENTION:  - will order 2 cartons (474 ml) Osmolite 1.5 QID with 60 ml water before and 60 ml water after each TF bolus. - this regimen will provide 2840 kcal, 119 grams protein, and 2408 ml free water.  - will check with [employer] about other TF formula options.   Monitor magnesium, potassium, and phosphorus daily for at least 3 days, MD to replete as needed, as pt is at risk for refeeding syndrome given severe malnutrition, uncertain how much TF patient is actually administering/day.   NUTRITION DIAGNOSIS:   Severe Malnutrition related to chronic illness,cancer and cancer related treatments as evidenced by severe fat depletion,severe muscle depletion.  GOAL:   Patient will meet greater than or equal to 90% of their needs  MONITOR:   TF tolerance,Labs,Weight trends,Skin  REASON FOR ASSESSMENT:   Malnutrition Screening Tool,Consult Enteral/tube feeding initiation and management  ASSESSMENT:   65 y.o. 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 He presented to the ED due to shortness of breath that began 2 days PTA and progressively worsened. EMS was called to the home and he was noted to be 80% on room air.  Patient has been NPO since admission. At home he will occasionally take sips of water, but water subsequently spills out of his stomach. All nutrition and medications are via PEG.  His daughter is at bedside and assists with providing information. Patient is able to nod/shake head and mouth words.  He self administers TF or family assists. He uses [facility]. He denies any pain, redness, or leakage around PEG.   At home, he has been  using Osmolite 1.5 since PEG placement and his goal is 3 cartons (237 ml each) QID which is a total of 12 cartons/day. This regimen would provide 4260 kcal, 179 grams protein, and 2172 ml free water.  It was reported that patient also administers "a lot" of water each day between boluses of TF. A more detailed report of the amount of water was unable to be obtained.   Daughter reports that TF regimen outlined above is a goal but that patient is often unable to get in this many/day d/t weakness and/or beginning to feel nauseated from the volume of TF. She asks about possibility for another TF formula d/t patient not gaining weight. It was unable to be determined an average number of cartons that he is able to administer/day.   Weight yesterday was 110 lb and PTA the most recently documented weight was on 04/10/20 when he weighed 120 lb. This indicates a 10 lb weight loss (8.3% body weight) in the past 2.5 months; significant for time frame.  Patient has many scabs on all extremities. Daughter reports that he has paper-thin skin and any little bump into something results in scabbing.  It was reported that patient ambulates at home without a cane or walker but that he has become very weak recently and has had several falls. They live in a 2 story house with all bedrooms on the second level of the home; there is a railing patient can hold onto while on the stairs.     Labs reviewed; BUN: 36 mg/dl, Ca: 8.4 mg/dl. Medications reviewed; 80 mg solu-medrol x1 dose 2/1, 40 mg protonix  per PEG/day. IVF; NS @ 125 ml/hr.    NUTRITION - FOCUSED PHYSICAL EXAM:  Flowsheet Row Most Recent Value  Orbital Region Severe depletion  Upper Arm Region Severe depletion  Thoracic and Lumbar Region Severe depletion  Buccal Region Severe depletion  Temple Region Severe depletion  Clavicle Bone Region Severe depletion  Clavicle and Acromion Bone Region Severe depletion  Scapular Bone Region Severe depletion  Dorsal  Hand Severe depletion  Patellar Region Severe depletion  Anterior Thigh Region Severe depletion  Posterior Calf Region Severe depletion  Edema (RD Assessment) None  Hair Reviewed  Eyes Reviewed  Mouth Reviewed  Skin Reviewed  [many scabs on all extremities]  Nails Reviewed       Diet Order:   Diet Order            Diet NPO time specified  Diet effective now                 EDUCATION NEEDS:   No education needs have been identified at this time  Skin:  Skin Assessment: Skin Integrity Issues: Skin Integrity Issues:: Other (Comment) Other: MASD to perineum  Last BM:  PTA/unknown  Height:   Ht Readings from Last 1 Encounters:  06/23/20 6' (1.829 m)    Weight:   Wt Readings from Last 1 Encounters:  06/23/20 49.9 kg    Estimated Nutritional Needs:  Kcal:  2500-2700 kcal Protein:  120-135 grams Fluid:  >/= 2.5 L/day     Jarome Matin, MS, RD, LDN, CNSC Inpatient Clinical Dietitian RD pager # available in [facility]  After hours/weekend pager # available in [facility]

## 2020-06-25 ENCOUNTER — Other Ambulatory Visit: Payer: Self-pay

## 2020-06-25 ENCOUNTER — Inpatient Hospital Stay (HOSPITAL_COMMUNITY): Payer: 59

## 2020-06-25 ENCOUNTER — Encounter (HOSPITAL_COMMUNITY): Payer: Self-pay | Admitting: Internal Medicine

## 2020-06-25 DIAGNOSIS — J449 Chronic obstructive pulmonary disease, unspecified: Secondary | ICD-10-CM | POA: Diagnosis not present

## 2020-06-25 DIAGNOSIS — Q742 Other congenital malformations of lower limb(s), including pelvic girdle: Secondary | ICD-10-CM

## 2020-06-25 DIAGNOSIS — R197 Diarrhea, unspecified: Secondary | ICD-10-CM

## 2020-06-25 DIAGNOSIS — A419 Sepsis, unspecified organism: Secondary | ICD-10-CM | POA: Diagnosis not present

## 2020-06-25 DIAGNOSIS — J9601 Acute respiratory failure with hypoxia: Secondary | ICD-10-CM | POA: Diagnosis not present

## 2020-06-25 DIAGNOSIS — J189 Pneumonia, unspecified organism: Secondary | ICD-10-CM | POA: Diagnosis not present

## 2020-06-25 LAB — CBC WITH DIFFERENTIAL/PLATELET
Abs Immature Granulocytes: 0.03 10*3/uL (ref 0.00–0.07)
Basophils Absolute: 0 10*3/uL (ref 0.0–0.1)
Basophils Relative: 0 %
Eosinophils Absolute: 0 10*3/uL (ref 0.0–0.5)
Eosinophils Relative: 0 %
HCT: 25.6 % — ABNORMAL LOW (ref 39.0–52.0)
Hemoglobin: 7.5 g/dL — ABNORMAL LOW (ref 13.0–17.0)
Immature Granulocytes: 0 %
Lymphocytes Relative: 7 %
Lymphs Abs: 0.5 10*3/uL — ABNORMAL LOW (ref 0.7–4.0)
MCH: 26.3 pg (ref 26.0–34.0)
MCHC: 29.3 g/dL — ABNORMAL LOW (ref 30.0–36.0)
MCV: 89.8 fL (ref 80.0–100.0)
Monocytes Absolute: 0.6 10*3/uL (ref 0.1–1.0)
Monocytes Relative: 7 %
Neutro Abs: 7.2 10*3/uL (ref 1.7–7.7)
Neutrophils Relative %: 86 %
Platelets: 278 10*3/uL (ref 150–400)
RBC: 2.85 MIL/uL — ABNORMAL LOW (ref 4.22–5.81)
RDW: 15.7 % — ABNORMAL HIGH (ref 11.5–15.5)
WBC: 8.4 10*3/uL (ref 4.0–10.5)
nRBC: 0 % (ref 0.0–0.2)

## 2020-06-25 LAB — RETICULOCYTES
Immature Retic Fract: 17.8 % — ABNORMAL HIGH (ref 2.3–15.9)
RBC.: 2.84 MIL/uL — ABNORMAL LOW (ref 4.22–5.81)
Retic Count, Absolute: 30.1 10*3/uL (ref 19.0–186.0)
Retic Ct Pct: 1.1 % (ref 0.4–3.1)

## 2020-06-25 LAB — COMPREHENSIVE METABOLIC PANEL
ALT: 15 U/L (ref 0–44)
AST: 23 U/L (ref 15–41)
Albumin: 2.5 g/dL — ABNORMAL LOW (ref 3.5–5.0)
Alkaline Phosphatase: 65 U/L (ref 38–126)
Anion gap: 11 (ref 5–15)
BUN: 38 mg/dL — ABNORMAL HIGH (ref 8–23)
CO2: 26 mmol/L (ref 22–32)
Calcium: 8.4 mg/dL — ABNORMAL LOW (ref 8.9–10.3)
Chloride: 107 mmol/L (ref 98–111)
Creatinine, Ser: 0.82 mg/dL (ref 0.61–1.24)
GFR, Estimated: >60 mL/min (ref >60)
Glucose, Bld: 70 mg/dL (ref 70–99)
Potassium: 4 mmol/L (ref 3.5–5.1)
Sodium: 144 mmol/L (ref 135–145)
Total Bilirubin: 0.3 mg/dL (ref 0.3–1.2)
Total Protein: 6.1 g/dL — ABNORMAL LOW (ref 6.5–8.1)

## 2020-06-25 LAB — VITAMIN B12: Vitamin B-12: 1119 pg/mL — ABNORMAL HIGH (ref 180–914)

## 2020-06-25 LAB — C-REACTIVE PROTEIN: CRP: 9.2 mg/dL — ABNORMAL HIGH (ref <1.0)

## 2020-06-25 LAB — IRON AND TIBC
Iron: 13 ug/dL — ABNORMAL LOW (ref 45–182)
Saturation Ratios: 11 % — ABNORMAL LOW (ref 17.9–39.5)
TIBC: 123 ug/dL — ABNORMAL LOW (ref 250–450)
UIBC: 110 ug/dL

## 2020-06-25 LAB — GLUCOSE, CAPILLARY
Glucose-Capillary: 130 mg/dL — ABNORMAL HIGH (ref 70–99)
Glucose-Capillary: 55 mg/dL — ABNORMAL LOW (ref 70–99)
Glucose-Capillary: 73 mg/dL (ref 70–99)
Glucose-Capillary: 75 mg/dL (ref 70–99)
Glucose-Capillary: 79 mg/dL (ref 70–99)
Glucose-Capillary: 88 mg/dL (ref 70–99)

## 2020-06-25 LAB — PHOSPHORUS
Phosphorus: 3.1 mg/dL (ref 2.5–4.6)
Phosphorus: 1.9 mg/dL — ABNORMAL LOW (ref 2.5–4.6)

## 2020-06-25 LAB — HEMOGLOBIN AND HEMATOCRIT, BLOOD
HCT: 26.9 % — ABNORMAL LOW (ref 39.0–52.0)
Hemoglobin: 8 g/dL — ABNORMAL LOW (ref 13.0–17.0)

## 2020-06-25 LAB — C DIFFICILE QUICK SCREEN W PCR REFLEX
C Diff antigen: NEGATIVE
C Diff interpretation: NOT DETECTED
C Diff toxin: NEGATIVE

## 2020-06-25 LAB — FERRITIN: Ferritin: 279 ng/mL (ref 24–336)

## 2020-06-25 LAB — FOLATE: Folate: 9 ng/mL (ref >5.9)

## 2020-06-25 LAB — MAGNESIUM
Magnesium: 2.1 mg/dL (ref 1.7–2.4)
Magnesium: 2 mg/dL (ref 1.7–2.4)

## 2020-06-25 LAB — SEDIMENTATION RATE: Sed Rate: 70 mm/hr — ABNORMAL HIGH (ref 0–16)

## 2020-06-25 MED ORDER — LIP MEDEX EX OINT
TOPICAL_OINTMENT | CUTANEOUS | Status: AC
  Filled 2020-06-25: qty 7

## 2020-06-25 MED ORDER — IPRATROPIUM-ALBUTEROL 0.5-2.5 (3) MG/3ML IN SOLN
3.0000 mL | Freq: Three times a day (TID) | RESPIRATORY_TRACT | Status: DC
  Administered 2020-06-25 – 2020-06-27 (×7): 3 mL via RESPIRATORY_TRACT
  Filled 2020-06-25 (×7): qty 3

## 2020-06-25 MED ORDER — LOPERAMIDE HCL 2 MG PO CAPS
2.0000 mg | ORAL_CAPSULE | ORAL | Status: DC | PRN
  Administered 2020-06-25 – 2020-07-06 (×4): 2 mg via ORAL
  Filled 2020-06-25 (×5): qty 1

## 2020-06-25 MED ORDER — POTASSIUM PHOSPHATES 15 MMOLE/5ML IV SOLN
30.0000 mmol | Freq: Once | INTRAVENOUS | Status: AC
  Administered 2020-06-26: 30 mmol via INTRAVENOUS
  Filled 2020-06-25: qty 10

## 2020-06-25 MED ORDER — LOPERAMIDE HCL 2 MG PO CAPS
4.0000 mg | ORAL_CAPSULE | Freq: Once | ORAL | Status: AC
  Administered 2020-06-25: 4 mg via ORAL
  Filled 2020-06-25: qty 2

## 2020-06-25 NOTE — TOC Initial Note (Addendum)
Transition of Care [facility]) - Initial/Assessment Note    Patient Details  Name: Ronald Ruiz MRN: [id number] Date of Birth: 02-21-1956  [facility] CM/SW Contact:    Lia Hopping, [place] Phone Number: 06/25/2020, 3:38 PM  Clinical Narrative:         CSW met with the patient at bedside to assess home needs. Patient non verbal. He uses head gestures, pen and paper to communicate. Patient reports he lives with his daughter. He currently has a PEG tube and active with [facility] for tube feedings.He confirm he has TF(Osmolite) at home.   Patient reports he manages his own trache. He is currently on oxygen, he is not on oxygen at home.  Patient reports he walks independently at home. Patient is not active with home health at this time however he is open to the services. CSW offered choice. Patient showed CSW a test message where his daughter suggested they start looking into ALF's. Patient reports he is not ready to transition to an ALF. He is open to resources about ALF's. Physician to add a SW to [facility] services for placement support, if needed.   CSW made a referral to [facility]. TOC staff will continue to follow.   Expected Discharge Plan: [place] Barriers to Discharge: Continued Medical Work up   Patient Goals and CMS Choice Patient states their goals for this hospitalization and ongoing recovery are:: return home with daughter CMS Medicare.gov Compare Post Acute Care list provided to:: Patient Choice offered to / list presented to : Patient  Expected Discharge Plan and Services Expected Discharge Plan: [place] In-house Referral: Clinical Social Work Discharge Planning Services: CM Consult Post Acute Care Choice: [place] arrangements for the past 2 months: Single Family Home                   DME Agency: [employer]       HH Arranged: PT,OT,RN,Nurse's [place] Work [employer] Agency: [facility]  [employer] Date [place]: 06/25/20 Time [place]: 1326    Prior Living Arrangements/Services Living arrangements for the past 2 months: [place] with:: Adult Children Patient language and need for interpreter reviewed:: No Do you feel safe going back to the place where you live?: Yes      Need for Family Participation in Patient Care: Yes (Comment) Care giver support system in place?: Yes (comment) Current home services: DME Criminal Activity/Legal Involvement Pertinent to Current Situation/Hospitalization: No - Comment as needed  Activities of Daily Living Home Assistive Devices/Equipment: Enteral Feeding Supplies,Feeding equipment ADL Screening (condition at time of admission) Patient's cognitive ability adequate to safely complete daily activities?: Yes Is the patient deaf or have difficulty hearing?: No Does the patient have difficulty seeing, even when wearing glasses/contacts?: No Does the patient have difficulty concentrating, remembering, or making decisions?: No Patient able to express need for assistance with ADLs?: Yes Does the patient have difficulty dressing or bathing?: Yes Independently performs ADLs?: No Communication: Independent Dressing (OT): Needs assistance Is this a change from baseline?: Change from baseline, expected to last >3 days Grooming: Independent Feeding: Needs assistance Is this a change from baseline?: Change from baseline, expected to last >3 days Bathing: Needs assistance Is this a change from baseline?: Change from baseline, expected to last >3 days Toileting: Needs assistance Is this a change from baseline?: Change from baseline, expected to last >3days In/Out Bed:  Needs assistance Is this a change from baseline?: Change from baseline, expected to last >3 days Walks in Home: Needs assistance Is this a change from baseline?: Change from baseline, expected to last >3 days Does the  patient have difficulty walking or climbing stairs?: Yes Weakness of Legs: Both Weakness of Arms/Hands: Both  Permission Sought/Granted Permission sought to share information with : Family Supports Permission granted to share information with : Yes, Verbal Permission Granted              Emotional Assessment Appearance:: Developmentally appropriate Attitude/Demeanor/Rapport: Engaged Affect (typically observed): Pleasant,Accepting Orientation: : Oriented to Self,Oriented to Place,Oriented to  Time,Oriented to Situation Alcohol / Substance Use: Not Applicable Psych Involvement: No (comment)  Admission diagnosis:  Respiratory distress [R06.03] Acute respiratory failure with hypoxia ([place]) [J96.01] Community acquired pneumonia, unspecified laterality [J18.9] Patient Active Problem List   Diagnosis Date Noted  . Acute respiratory failure with hypoxia ([place]) 06/23/2020  . Community acquired pneumonia 06/23/2020  . Severe sepsis ([place]) 06/23/2020  . AKI (acute kidney injury) ([place]) 07/02/2019  . COPD (chronic obstructive pulmonary disease) ([place])   . Transaminitis   . Normocytic anemia   . Chronic pulmonary aspiration   . History of throat cancer   . Aspiration into airway 06/18/2019  . Dehydration 12/03/2018  . Counseling regarding advance care planning and goals of care 08/23/2018  . COPD with chronic bronchitis ([place]) 07/31/2018  . Dysphagia 07/31/2018  . Laryngeal mass 07/31/2018  . Substance abuse ([place]) 07/31/2018  . Protein-calorie malnutrition, severe 07/23/2018  . 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 [place] [address] Phone: [phone] Fax: [phone]  [id number] [place], [place] [address] Phone: [phone] Fax: [phone]  [id number] [address] [place]  [place] [address] Phone: [phone] Fax: [phone]     Social Determinants of Health (SDOH) Interventions    Readmission Risk Interventions Readmission Risk Prevention Plan 07/04/2019  Transportation Screening Complete  Medication Review Press photographer) Complete  PCP or Specialist appointment within 3-5 days of discharge Complete  HRI or [place] Complete  SW Recovery Care/Counseling Consult Complete  [place] Not Applicable  Some recent data might be hidden

## 2020-06-25 NOTE — Progress Notes (Signed)
PROGRESS NOTE    Ronald Ruiz  [id number] DOB: [birth year] DOA: 06/23/2020 PCP: [name], MD    Chief Complaint  Patient presents with  . 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 Patient noted to be hypotensive with systolic blood pressures in the 80s, tachycardic with a heart rate of 105, sats of 83% on 10 L O2 via trach collar. Patient given some saline nebs with some improvement. Patient also noted to have a leukocytosis. Chest x-ray done concerning for multifocal pneumonia. Patient received a dose of IV Solu-Medrol, placed empirically on IV antibiotics and hospitalist called for admission.   Assessment & Plan:   Principal Problem:   Severe sepsis ([facility]) Active Problems:   Acute respiratory failure with hypoxia ([facility])   Community acquired pneumonia   Malignant neoplasm of glottis (H[facility])   Protein-calorie malnutrition, severe   Dysphagia   COPD (chronic obstructive pulmonary disease) ([facility])   Normocytic anemia   Chronic pulmonary aspiration  1 severe sepsis and acute respiratory failure with hypoxia secondary to multifocal pneumonia Patient noted to be hypoxic on admission with sats of 83% on 10 L O2 via trach collar on arrival. Patient also with a leukocytosis, elevated lactic acid level, tachycardic with heart rate up to 105, chest x-ray with multifocal pneumonia. SARS coronavirus PCR negative. Patient pancultured results pending. Urine strep pneumococcus antigen pending, urine Legionella antigen pending. Sputum Gram stain and cultures pending. Currently on 8 L O2 via trach collar. Patient seems comfortable and slowly improving clinically. Patient with no  use of accessory muscles of respiration. Continue empiric IV Rocephin and doxycycline. Continue Pulmicort nebs, scheduled duo nebs. Trach suctioning. Trach care per RN/RT. Supportive care.  2. COPD Stable. Patient with no active wheezing however patient presented with acute respiratory failure with hypoxia currently on 8 L trach collar with FiO2 of 35%. Continue Pulmicort nebs, scheduled duo nebs. Follow.  3. Borderline blood pressures/hypotension Patient with systolic blood pressures in the 80s. Patient denies any dizziness. Patient noted to have baseline systolic blood pressures in the low 100s from prior office visits as noted on epic. Patient pancultured. Patient placed on IV albumin every 6 hours x24 hours. Improvement with blood pressure. Patient however with multiple loose stools. Continue IV fluids. Follow.  4. History of throat cancer status post resection and tracheostomy, since decannulated/oropharyngeal dysphagia with chronic pulmonary aspiration now PEG dependent Keep n.p.o. Continue stoma and PEG tube care. Dietitian consulted.  5. Severe protein calorie malnutrition Secondary to chronic illness. Dietitian consulted and following.  6. Anemia of chronic disease/iron deficiency anemia Patient with no overt bleeding, hemoglobin trickling down likely due to dilutional effect. Anemia panel consistent with anemia of chronic disease/iron deficiency. Iron level at 13. Hemoglobin currently at 7.5. Repeat H&H this afternoon.Transfusion threshold hemoglobin < 7. May need IV iron towards the end of hospitalization. Will likely need oral iron supplementation on discharge.  7. Abnormality of left second toe Left second toe dark with some thickening of the skin and tender to palpation. Check a CRP, sed rate. Check plain films to rule out osteomyelitis. Patient on antibiotics. May need podiatry input. Follow.  8. Diarrhea Patient with multiple loose stools per patient and per RN. Patient on  antibiotics. WBC normalized. Check a  C. difficile PCR. If C. difficile PCR is negative will place on Imodium as needed. Supportive care.   DVT prophylaxis: Lovenox Code Status: Full Family Communication: Updated patient. No family at bedside. Disposition:   Status is: Inpatient    Dispo: The patient is from:               Anticipated d/c is to: Home              Anticipated d/c date is: 3 to 4 days               Patient currently with multifocal pneumonia, with stoma, multiple loose stools, low to borderline blood pressure, on IV antibiotics. Not stable for discharge.   Difficult to place patient undetermined       Consultants:   None  Procedures:   Chest x-ray 06/23/2020  Antimicrobials:   Oral doxycycline to 06/23/2020>>>>  IV cefepime 06/23/2018 22x1 dose  IV Rocephin to 06/23/2020>>>>  IV vancomycin x1 dose   Subjective: Patient sitting up in bed speaking with the transitions of care manager.  States shortness of breath slowly improving.  Denies any chest pain.  No abdominal pain.  States he is having numerous watery loose stools today.  Sitting up in bed. States he is feeling a little bit better than on admission. States secretions from stoma decreasing. Denies any chest pain. No significant abdominal pain.  Objective: Vitals:   06/24/20 2222 06/25/20 0412 06/25/20 0616 06/25/20 0802  BP: (!) 95/57  115/66   Pulse: 69 67 75   Resp: 18 16 20    Temp: 97.9 F (36.6 C)  98.2 F (36.8 C)   TempSrc: Oral  Oral   SpO2: 97% 94% 95% 95%  Weight:      Height:        Intake/Output Summary (Last 24 hours) at 06/25/2020 1101 Last data filed at 06/25/2020 0937 Gross per 24 hour  Intake 1929.82 ml  Output 1301 ml  Net 628.82 ml   Filed Weights   06/23/20 1826  Weight: 49.9 kg    Examination:  General exam: Cachetic. Frail.  Stoma.  Trach collar. Respiratory system: Decreased breath sounds in the bases. Fair air movement. No wheezing. Cardiovascular system: Regular  rate rhythm no murmurs rubs or gallops. No JVD. No lower extremity edema.  Gastrointestinal system: Abdomen is soft, nontender, nondistended, positive bowel sounds. PEG tube in place. No rebound. No guarding.  Central nervous system: Alert and oriented. No focal neurological deficits. Extremities: Left second toe dark/? necrosis, skin thickening on the medial side of the toe, tender to palpation.  Skin: No rashes, lesions or ulcers Psychiatry: Judgement and insight appear normal. Mood & affect appropriate.     Data Reviewed: I have personally reviewed following labs and imaging studies  CBC: Recent Labs  Lab 06/23/20 1510 06/24/20 0331 06/25/20 0316  WBC 12.5* 9.5 8.4  NEUTROABS 11.3*  --  7.2  HGB 9.6* 8.5* 7.5*  HCT 31.8* 27.9* 25.6*  MCV 89.1 88.3 89.8  PLT 350 289 278    Basic Metabolic Panel: Recent Labs  Lab 06/23/20 1510 06/24/20 0331 06/24/20 1550 06/25/20 0316  NA 143 142  --  144  K 3.8 4.0  --  4.0  CL 105 104  --  107  CO2 27 26  --  26  GLUCOSE 97 91  --  70  BUN 32* 36*  --  38*  CREATININE 0.87 0.81  --  0.82  CALCIUM 8.8* 8.4*  --  8.4*  MG  --   --  2.0 2.1  PHOS  --   --  5.1* 3.1    GFR: Estimated Creatinine Clearance: 64.2 mL/min (by C-G formula based on SCr of 0.82 mg/dL).  Liver Function Tests: Recent Labs  Lab 06/23/20 1510 06/25/20 0316  AST 22 23  ALT 14 15  ALKPHOS 92 65  BILITOT 0.4 0.3  PROT 6.9 6.1*  ALBUMIN 2.0* 2.5*    CBG: Recent Labs  Lab 06/24/20 1859 06/25/20 0012 06/25/20 0610 06/25/20 0659  GLUCAP 82 88 55* 79     Recent Results (from the past 240 hour(s))  SARS Coronavirus 2 by RT PCR (hospital order, performed in [facility] hospital lab) Nasopharyngeal Nasopharyngeal Swab     Status: None   Collection Time: 06/23/20  3:10 PM   Specimen: Nasopharyngeal Swab  Result Value Ref Range Status   SARS Coronavirus 2 NEGATIVE NEGATIVE Final    Comment: (NOTE) SARS-CoV-2 target nucleic acids are NOT  DETECTED.  The SARS-CoV-2 RNA is generally detectable in upper and lower respiratory specimens during the acute phase of infection. The lowest concentration of SARS-CoV-2 viral copies this assay can detect is 250 copies / mL. A negative result does not preclude SARS-CoV-2 infection and should not be used as the sole basis for treatment or other patient management decisions.  A negative result may occur with improper specimen collection / handling, submission of specimen other than nasopharyngeal swab, presence of viral mutation(s) within the areas targeted by this assay, and inadequate number of viral copies (<250 copies / mL). A negative result must be combined with clinical observations, patient history, and epidemiological information.  Fact Sheet for Patients:   StrictlyIdeas.no  Fact Sheet for Healthcare Providers: BankingDealers.co.za  This test is not yet approved or  cleared by the [place] FDA and has been authorized for detection and/or diagnosis of SARS-CoV-2 by FDA under an Emergency Use Authorization (EUA).  This EUA will remain in effect (meaning this test can be used) for the duration of the COVID-19 declaration under Section 564(b)(1) of the Act, 21 U.S.C. section 360bbb-3(b)(1), unless the authorization is terminated or revoked sooner.  Performed at [facility], [place] [address], [place], [place] [address]   Culture, blood (routine x 2)     Status: None (Preliminary result)   Collection Time: 06/23/20  6:13 PM   Specimen: BLOOD  Result Value Ref Range Status   Specimen Description   Final    BLOOD LEFT ANTECUBITAL Performed at [facility] [address], [place], [place] [address]    Special Requests   Final    BOTTLES DRAWN AEROBIC AND ANAEROBIC Blood Culture results may not be optimal due to an inadequate volume of blood received in culture bottles Performed at [facility] [address] [place], [place] [address]    Culture   Final    NO GROWTH 2 DAYS Performed at [facility] [address], [place], [place] [address]    Report Status PENDING  Incomplete  Culture, blood (routine x 2)     Status: None (Preliminary result)   Collection Time: 06/23/20  6:38 PM   Specimen: BLOOD  Result Value Ref Range Status   Specimen Description   Final    BLOOD RIGHT ANTECUBITAL Performed at [facility] [address], [place], [place] [address]    Special Requests   Final    BOTTLES DRAWN AEROBIC AND  ANAEROBIC Blood Culture results may not be optimal due to an inadequate volume of blood received in culture bottles Performed at [facility], [place] [address], [place], [place] [address]    Culture   Final    NO GROWTH 2 DAYS Performed at [facility] [address], [place], [place] [address]    Report Status PENDING  Incomplete  Culture, sputum-assessment     Status: None   Collection Time: 06/23/20  7:24 PM   Specimen: Expectorated Sputum  Result Value Ref Range Status   Specimen Description EXPECTORATED SPUTUM  Final   Special Requests NONE  Final   Sputum evaluation   Final    THIS SPECIMEN IS ACCEPTABLE FOR SPUTUM CULTURE Performed at [facility], [place] [address] [place], [place] [address]    Report Status 06/23/2020 FINAL  Final  Culture, respiratory     Status: None (Preliminary result)   Collection Time: 06/23/20  7:24 PM  Result Value Ref Range Status   Specimen Description   Final    EXPECTORATED SPUTUM Performed at [facility] [address] [place], [place] [address]    Special Requests   Final    NONE Reflexed from [id number] Performed at [facility] [address], [place], [place] [address]    Gram Stain   Final    ABUNDANT WBC PRESENT, PREDOMINANTLY PMN ABUNDANT GRAM NEGATIVE RODS MODERATE GRAM  POSITIVE RODS FEW GRAM POSITIVE COCCI    Culture   Final    ABUNDANT GRAM NEGATIVE RODS CULTURE REINCUBATED FOR BETTER GROWTH Performed at [facility], [place] [address], [place], [place] [address]    Report Status PENDING  Incomplete         Radiology Studies: DG Chest Portable 1 View  Result Date: 06/23/2020 CLINICAL DATA:  Cough.  Shortness of breath. EXAM: PORTABLE CHEST 1 VIEW COMPARISON:  July 02, 2019. FINDINGS: The heart size and mediastinal contours are within normal limits. No pneumothorax is noted. Increased bilateral lung opacities are noted concerning for multifocal pneumonia. Small pleural effusions may be present. The visualized skeletal structures are unremarkable. IMPRESSION: Increased bilateral lung opacities are noted concerning for multifocal pneumonia. Electronically Signed   By: Marijo Conception M.D.   On: 06/23/2020 15:40        Scheduled Meds: . budesonide  0.5 mg Nebulization BID  . doxycycline  100 mg Per Tube Q12H  . enoxaparin (LOVENOX) injection  40 mg Subcutaneous QHS  . feeding supplement (OSMOLITE 1.5 CAL)  474 mL Per Tube QID  . free water  120 mL Per Tube QID  . guaiFENesin  15 mL Per Tube Q6H  . ipratropium-albuterol  3 mL Nebulization TID  . loratadine  10 mg Per Tube Daily  . methadone  120 mg Per Tube Daily  . pantoprazole sodium  40 mg Per Tube Daily   Continuous Infusions: . sodium chloride 100 mL/hr at 06/25/20 1030  . albumin human Stopped (06/25/20 0814)  . cefTRIAXone (ROCEPHIN)  IV Stopped (06/25/20 0815)     LOS: 1 day    Time spent: 35 minutes    Irine Seal, MD [facility]   To contact the attending provider between 7A-7P or the covering provider during after hours 7P-7A, please log into the web site [email or web address] and access using universal  password for that web site. If you do not have the password, please call the hospital operator.  06/25/2020, 11:01 AM

## 2020-06-26 ENCOUNTER — Telehealth: Payer: Self-pay | Admitting: Family Medicine

## 2020-06-26 ENCOUNTER — Inpatient Hospital Stay (HOSPITAL_COMMUNITY): Payer: 59

## 2020-06-26 DIAGNOSIS — R1310 Dysphagia, unspecified: Secondary | ICD-10-CM | POA: Diagnosis not present

## 2020-06-26 DIAGNOSIS — A419 Sepsis, unspecified organism: Secondary | ICD-10-CM | POA: Diagnosis not present

## 2020-06-26 DIAGNOSIS — E870 Hyperosmolality and hypernatremia: Secondary | ICD-10-CM

## 2020-06-26 DIAGNOSIS — S92505P Nondisplaced unspecified fracture of left lesser toe(s), subsequent encounter for fracture with malunion: Secondary | ICD-10-CM

## 2020-06-26 DIAGNOSIS — J9601 Acute respiratory failure with hypoxia: Secondary | ICD-10-CM | POA: Diagnosis not present

## 2020-06-26 DIAGNOSIS — J189 Pneumonia, unspecified organism: Secondary | ICD-10-CM | POA: Diagnosis not present

## 2020-06-26 LAB — CBC WITH DIFFERENTIAL/PLATELET
Abs Immature Granulocytes: 0.04 10*3/uL (ref 0.00–0.07)
Basophils Absolute: 0 10*3/uL (ref 0.0–0.1)
Basophils Relative: 0 %
Eosinophils Absolute: 0 10*3/uL (ref 0.0–0.5)
Eosinophils Relative: 0 %
HCT: 28 % — ABNORMAL LOW (ref 39.0–52.0)
Hemoglobin: 8.3 g/dL — ABNORMAL LOW (ref 13.0–17.0)
Immature Granulocytes: 1 %
Lymphocytes Relative: 6 %
Lymphs Abs: 0.5 10*3/uL — ABNORMAL LOW (ref 0.7–4.0)
MCH: 26.5 pg (ref 26.0–34.0)
MCHC: 29.6 g/dL — ABNORMAL LOW (ref 30.0–36.0)
MCV: 89.5 fL (ref 80.0–100.0)
Monocytes Absolute: 0.6 10*3/uL (ref 0.1–1.0)
Monocytes Relative: 7 %
Neutro Abs: 7.1 10*3/uL (ref 1.7–7.7)
Neutrophils Relative %: 86 %
Platelets: 285 10*3/uL (ref 150–400)
RBC: 3.13 MIL/uL — ABNORMAL LOW (ref 4.22–5.81)
RDW: 15.8 % — ABNORMAL HIGH (ref 11.5–15.5)
WBC: 8.2 10*3/uL (ref 4.0–10.5)
nRBC: 0 % (ref 0.0–0.2)

## 2020-06-26 LAB — BASIC METABOLIC PANEL
Anion gap: 10 (ref 5–15)
BUN: 24 mg/dL — ABNORMAL HIGH (ref 8–23)
CO2: 28 mmol/L (ref 22–32)
Calcium: 8.3 mg/dL — ABNORMAL LOW (ref 8.9–10.3)
Chloride: 110 mmol/L (ref 98–111)
Creatinine, Ser: 0.7 mg/dL (ref 0.61–1.24)
GFR, Estimated: >60 mL/min (ref >60)
Glucose, Bld: 96 mg/dL (ref 70–99)
Potassium: 3 mmol/L — ABNORMAL LOW (ref 3.5–5.1)
Sodium: 148 mmol/L — ABNORMAL HIGH (ref 135–145)

## 2020-06-26 LAB — GLUCOSE, CAPILLARY
Glucose-Capillary: 110 mg/dL — ABNORMAL HIGH (ref 70–99)
Glucose-Capillary: 135 mg/dL — ABNORMAL HIGH (ref 70–99)
Glucose-Capillary: 69 mg/dL — ABNORMAL LOW (ref 70–99)
Glucose-Capillary: 95 mg/dL (ref 70–99)

## 2020-06-26 LAB — PHOSPHORUS: Phosphorus: 2.7 mg/dL (ref 2.5–4.6)

## 2020-06-26 LAB — MAGNESIUM: Magnesium: 2.1 mg/dL (ref 1.7–2.4)

## 2020-06-26 MED ORDER — LOPERAMIDE HCL 2 MG PO CAPS
4.0000 mg | ORAL_CAPSULE | Freq: Once | ORAL | Status: AC
  Administered 2020-06-26: 4 mg via ORAL
  Filled 2020-06-26: qty 2

## 2020-06-26 MED ORDER — POTASSIUM CHLORIDE 20 MEQ PO PACK
40.0000 meq | PACK | ORAL | Status: AC
  Administered 2020-06-26 (×2): 40 meq
  Filled 2020-06-26 (×2): qty 2

## 2020-06-26 MED ORDER — PROSOURCE TF PO LIQD
45.0000 mL | Freq: Three times a day (TID) | ORAL | Status: DC
  Administered 2020-06-26 – 2020-06-30 (×11): 45 mL
  Filled 2020-06-26 (×14): qty 45

## 2020-06-26 MED ORDER — FREE WATER
240.0000 mL | Freq: Every day | Status: DC
  Administered 2020-06-26 – 2020-06-30 (×18): 240 mL

## 2020-06-26 MED ORDER — POTASSIUM CHLORIDE 2 MEQ/ML IV SOLN: INTRAVENOUS | Status: DC

## 2020-06-26 MED ORDER — KATE FARMS STANDARD 1.4 PO LIQD
325.0000 mL | Freq: Every day | ORAL | Status: DC
  Administered 2020-06-26 – 2020-06-30 (×18): 325 mL
  Filled 2020-06-26 (×21): qty 325

## 2020-06-26 MED ORDER — DEXTROSE 5 % IV SOLN: INTRAVENOUS | Status: DC

## 2020-06-26 NOTE — Progress Notes (Signed)
Nutrition Follow-up  DOCUMENTATION CODES:   Severe malnutrition in context of chronic illness,Underweight  INTERVENTION:  - will change TF regimen: 1 carton (325 ml) [employer] 1.4 x5/day with 45 ml Prosource TF (or equivalent) TID, and 120 ml free water before and after each TF bolus. - this regimen will provide 2395 kcal (96% kcal need), 133 grams protein, and 2370 ml free water.   NUTRITION DIAGNOSIS:   Severe Malnutrition related to chronic illness,cancer and cancer related treatments as evidenced by severe fat depletion,severe muscle depletion. -ongoing  GOAL:   Patient will meet greater than or equal to 90% of their needs -met with TF regimen  MONITOR:   TF tolerance,Labs,Weight trends,Skin  REASON FOR ASSESSMENT:   Consult Assessment of nutrition requirement/status  ASSESSMENT:   66 y.o. 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 He presented to the ED due to shortness of breath that began 2 days PTA and progressively worsened. EMS was called to the home and he was noted to be 80% on room air.  Patient with stoma from previous trach site and remains NPO. PEG in place. Patient is currently receiving 2 cartons (474 ml) Osmolite 1.5 QID with 60 ml water before and 60 ml water after each TF bolus. This regimen provides 2840 kcal, 119 grams protein, and 2408 ml free water.   Call received from RN that patient has been experiencing diarrhea x2 days. Patient has been receiving Osmolite 1.5 TF for >1 year without issue prior to the past 2 days.   Had planned to look at changing patient to another formula and have been communicating with [facility] ([employer]). Will make adjustments as outlined above.    Per notes: - severe sepsis on admission - hx of  COPD--stable - anemia of chronic disease/iron deficiency anemia - diarrhea--Cdiff PCR checked 2/3 and was negative for antigen and toxin   Labs reviewed; CBGs: 69 and 135 mg/dl, Na: 148 mmol/l, K: 3 mmol/l, BUN: 24 mg/dl, Ca: 8.3 mg/dl. Medications reviewed; 4 mg imodium x1 dose 2/3 and x1 dose 2/4, 40 mg protonix/day, 40 mEq Klor-Con every 4 hours, 30 mmol IV KPhos x1 run 2/3 IVF; D5 @ 125 ml/hr (510 kcal/24 hours) started today at 1230.    Diet Order:   Diet Order            Diet NPO time specified  Diet effective now                 EDUCATION NEEDS:   No education needs have been identified at this time  Skin:  Skin Assessment: Skin Integrity Issues: Skin Integrity Issues:: Other (Comment) Other: MASD to perineum; venous stasis ulcer/necrosis L 2nd toe  Last BM:  2/4 (type 7 x3); rectal tube placed today at 1130  Height:   Ht Readings from Last 1 Encounters:  06/23/20 6' (1.829 m)    Weight:   Wt Readings from Last 1 Encounters:  06/26/20 52.3 kg    Estimated Nutritional Needs:   Kcal:  2500-2700 kcal  Protein:  120-135 grams  Fluid:  >/= 2.5 L/day      Jarome Matin, MS, RD, LDN, CNSC Inpatient Clinical Dietitian RD pager # available in [place]  After hours/weekend pager # available in [facility]

## 2020-06-26 NOTE — Telephone Encounter (Signed)
Hospital consultation has been requested for the following patient, imaging has been completed, abnormal second toe with discoloration  Providers contact information: Dr. Grandville Silos [phone]  Patients Location: [place] [phone]

## 2020-06-26 NOTE — Progress Notes (Signed)
PROGRESS NOTE    Ronald Ruiz  ZOX:[id number] DOB: 12-Sep-1955 DOA: 06/23/2020 PCP: [name], MD    Chief Complaint  Patient presents with  . 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 Patient noted to be hypotensive with systolic blood pressures in the 80s, tachycardic with a heart rate of 105, sats of 83% on 10 L O2 via trach collar. Patient given some saline nebs with some improvement. Patient also noted to have a leukocytosis. Chest x-ray done concerning for multifocal pneumonia. Patient received a dose of IV Solu-Medrol, placed empirically on IV antibiotics and hospitalist called for admission.   Assessment & Plan:   Principal Problem:   Severe sepsis ([place]) Active Problems:   Acute respiratory failure with hypoxia (HCC)   Community acquired pneumonia   Malignant neoplasm of glottis (HCC)   Protein-calorie malnutrition, severe   Dysphagia   COPD (chronic obstructive pulmonary disease) (HCC)   Normocytic anemia   Chronic pulmonary aspiration   Diarrhea   Abnormally small toe  1 severe sepsis and acute respiratory failure with hypoxia secondary to multifocal pneumonia Patient noted to be hypoxic on admission with sats of 83% on 10 L O2 via trach collar on arrival. Patient also with a leukocytosis, elevated lactic acid level, tachycardic with heart rate up to 105, chest x-ray with multifocal pneumonia. SARS coronavirus PCR negative. Patient pancultured results pending. Urine strep pneumococcus antigen pending, urine Legionella antigen pending. Sputum Gram stain and cultures with abundant procidentia rettgeri/moderate Klebsiella pneumonia with susceptibilities  pending. Currently on 8 L O2 via trach collar. Patient seems comfortable and slowly improving clinically. Patient with no use of accessory muscles of respiration. Continue empiric IV Rocephin and doxycycline. Continue Pulmicort nebs, scheduled duo nebs. Trach suctioning. Trach care per RN/RT. Supportive care.  2. COPD Stable. Patient with no active wheezing however patient presented with acute respiratory failure with hypoxia currently on 8 L trach collar with FiO2 of 35%. Continue Pulmicort nebs, scheduled duo nebs. Follow.  3. Borderline blood pressures/hypotension Patient with systolic blood pressures in the 100s. Patient denies any dizziness. Patient noted to have baseline systolic blood pressures in the low 100s from prior office visits as noted on epic. Patient pancultured. Patient s/p IV albumin every 6 hours x24 hours. Improvement with blood pressure. Patient however with multiple loose stools. Continue IV fluids. Follow.  4. History of throat cancer status post resection and tracheostomy, since decannulated/oropharyngeal dysphagia with chronic pulmonary aspiration now PEG dependent Keep n.p.o. Continue stoma and PEG tube care. Dietitian consulted.  5. Severe protein calorie malnutrition Secondary to chronic illness. Dietitian consulted and following.  6. Anemia of chronic disease/iron deficiency anemia Patient with no overt bleeding, hemoglobin trickling down likely due to dilutional effect. Anemia panel consistent with anemia of chronic disease/iron deficiency. Iron level at 13. Hemoglobin currently at 8.3.Transfusion threshold hemoglobin < 7. May need IV iron towards the end of hospitalization. Will likely need oral iron supplementation on discharge.  7. Abnormality of left second toe/left second toe commuted fracture Left second toe dark with some thickening of the skin and tender to palpation.  Plain films done consistent with a commuted fracture involving distal portion of the second  proximal phalanx with severe dislocation of the second middle phalanx  relative to the second proximal phalanx.  CRP elevated at 9.2.  Sed rate elevated at 70.  Patient on IV antibiotics.  Podiatry consultation pending.   8. Diarrhea Patient with multiple loose stools per patient and per RN. Patient on antibiotics. WBC normalized.  C. difficile PCR negative.  Imodium as needed.  Follow.  9.  Hypernatremia Likely secondary to dehydration from numerous voluminous diarrhea.  Placed on D5W.  Follow.  10.  Hypokalemia Secondary to GI losses.  Replete.   DVT prophylaxis: Lovenox Code Status: Full Family Communication: Updated patient.  Updated daughter at bedside.  Disposition:   Status is: Inpatient    Dispo: The patient is from:               Anticipated d/c is to: Home              Anticipated d/c date is: 3 to 4 days               Patient currently with multifocal pneumonia, with stoma, multiple loose stools, low to borderline blood pressure, on IV antibiotics. Not stable for discharge.   Difficult to place patient undetermined       Consultants:   Podiatry pending  Procedures:   Chest x-ray 06/23/2020  Plain films of left second toe 06/25/2020  Antimicrobials:   Oral doxycycline to 06/23/2020>>>>  IV cefepime 06/23/2018 22x1 dose  IV Rocephin to 06/23/2020>>>>  IV vancomycin x1 dose   Subjective: Patient sitting up in bed.  Feels breathing is slowly improving daily.  Denies any chest pain.  No abdominal pain.  Still with numerous watery stools per RN.  No abdominal pain.  Secretions from stoma improving.  Daughter at bedside.   Objective: Vitals:   06/25/20 2126 06/26/20 0114 06/26/20 0528 06/26/20 0744  BP: 121/67  121/77   Pulse: 72 74 69   Resp: 16 18 16    Temp: 98.1 F (36.7 C)  98 F (36.7 C)   TempSrc: Oral  Oral   SpO2: 94% 93% 99% 97%  Weight:   52.3 kg   Height:        Intake/Output Summary (Last 24 hours) at 06/26/2020 1132 Last data filed at  06/26/2020 [id number] Gross per 24 hour  Intake 1564.63 ml  Output 2125 ml  Net -560.37 ml   Filed Weights   06/23/20 1826 06/26/20 0528  Weight: 49.9 kg 52.3 kg    Examination:  General exam: Cachetic. Frail.  Stoma.  Trach collar. Respiratory system: CTAB anterior lung fields.  Some decreased breath sounds in the bases.  Fair air movement.  No wheezing.  Cardiovascular system: RRR no murmurs rubs or gallops.  No JVD.  No lower extremity edema. Gastrointestinal system: Abdomen is soft, nontender, nondistended, positive bowel sounds.  No rebound.  No guarding.  PEG tube in place. Central nervous system: Alert and oriented. No focal neurological deficits. Extremities: Left second toe dark/?  Bruising skin thickening on the medial side of the toe, tender to palpation.  Skin: No rashes, lesions or ulcers Psychiatry: Judgement and insight appear normal. Mood & affect appropriate.     Data Reviewed: I have personally reviewed following labs and imaging studies  CBC: Recent Labs  Lab 06/23/20 1510 06/24/20 0331 06/25/20 0316 06/25/20 1652 06/26/20 0322  WBC 12.5* 9.5 8.4  --  8.2  NEUTROABS 11.3*  --  7.2  --  7.1  HGB 9.6* 8.5* 7.5* 8.0* 8.3*  HCT 31.8* 27.9* 25.6* 26.9* 28.0*  MCV 89.1  88.3 89.8  --  89.5  PLT 350 289 278  --  564    Basic Metabolic Panel: Recent Labs  Lab 06/23/20 1510 06/24/20 0331 06/24/20 1550 06/25/20 0316 06/25/20 1652 06/26/20 0322  NA 143 142  --  144  --  148*  K 3.8 4.0  --  4.0  --  3.0*  CL 105 104  --  107  --  110  CO2 27 26  --  26  --  28  GLUCOSE 97 91  --  70  --  96  BUN 32* 36*  --  38*  --  24*  CREATININE 0.87 0.81  --  0.82  --  0.70  CALCIUM 8.8* 8.4*  --  8.4*  --  8.3*  MG  --   --  2.0 2.1 2.0 2.1  PHOS  --   --  5.1* 3.1 1.9* 2.7    GFR: Estimated Creatinine Clearance: 69 mL/min (by C-G formula based on SCr of 0.7 mg/dL).  Liver Function Tests: Recent Labs  Lab 06/23/20 1510 06/25/20 0316  AST 22 23  ALT 14 15   ALKPHOS 92 65  BILITOT 0.4 0.3  PROT 6.9 6.1*  ALBUMIN 2.0* 2.5*    CBG: Recent Labs  Lab 06/25/20 0659 06/25/20 1137 06/25/20 1742 06/25/20 2345 06/26/20 0529  GLUCAP 79 75 73 130* 69*     Recent Results (from the past 240 hour(s))  SARS Coronavirus 2 by RT PCR (hospital order, performed in [facility] hospital lab) Nasopharyngeal Nasopharyngeal Swab     Status: None   Collection Time: 06/23/20  3:10 PM   Specimen: Nasopharyngeal Swab  Result Value Ref Range Status   SARS Coronavirus 2 NEGATIVE NEGATIVE Final    Comment: (NOTE) SARS-CoV-2 target nucleic acids are NOT DETECTED.  The SARS-CoV-2 RNA is generally detectable in upper and lower respiratory specimens during the acute phase of infection. The lowest concentration of SARS-CoV-2 viral copies this assay can detect is 250 copies / mL. A negative result does not preclude SARS-CoV-2 infection and should not be used as the sole basis for treatment or other patient management decisions.  A negative result may occur with improper specimen collection / handling, submission of specimen other than nasopharyngeal swab, presence of viral mutation(s) within the areas targeted by this assay, and inadequate number of viral copies (<250 copies / mL). A negative result must be combined with clinical observations, patient history, and epidemiological information.  Fact Sheet for Patients:   StrictlyIdeas.no  Fact Sheet for Healthcare Providers: BankingDealers.co.za  This test is not yet approved or  cleared by the [place] FDA and has been authorized for detection and/or diagnosis of SARS-CoV-2 by FDA under an Emergency Use Authorization (EUA).  This EUA will remain in effect (meaning this test can be used) for the duration of the COVID-19 declaration under Section 564(b)(1) of the Act, 21 U.S.C. section 360bbb-3(b)(1), unless the authorization is terminated or revoked  sooner.  Performed at [facility], [place] [address], [place], [place] [address]   Culture, blood (routine x 2)     Status: None (Preliminary result)   Collection Time: 06/23/20  6:13 PM   Specimen: BLOOD  Result Value Ref Range Status   Specimen Description   Final    BLOOD LEFT ANTECUBITAL Performed at [place] [address] [place], [place] [address]    Special Requests   Final    BOTTLES DRAWN AEROBIC AND ANAEROBIC  Blood Culture results may not be optimal due to an inadequate volume of blood received in culture bottles Performed at [place] [address], [place], [place] [address]    Culture   Final    NO GROWTH 3 DAYS Performed at [facility], [place] [address] [place], [place] [address]    Report Status PENDING  Incomplete  Culture, blood (routine x 2)     Status: None (Preliminary result)   Collection Time: 06/23/20  6:38 PM   Specimen: BLOOD  Result Value Ref Range Status   Specimen Description   Final    BLOOD RIGHT ANTECUBITAL Performed at [place] [address], [place], [place] [address]    Special Requests   Final    BOTTLES DRAWN AEROBIC AND ANAEROBIC Blood Culture results may not be optimal due to an inadequate volume of blood received in culture bottles Performed at [place] [address] [place], [place] [address]    Culture   Final    NO GROWTH 3 DAYS Performed at [facility], [place] [address], [place], [place] [address]    Report Status PENDING  Incomplete  Culture, sputum-assessment     Status: None   Collection Time: 06/23/20  7:24 PM   Specimen: Expectorated Sputum  Result Value Ref Range Status   Specimen Description EXPECTORATED SPUTUM  Final   Special Requests NONE  Final   Sputum evaluation   Final    THIS SPECIMEN IS ACCEPTABLE FOR SPUTUM CULTURE Performed at [facility], [place] [address],  [place], [place] [address]    Report Status 06/23/2020 FINAL  Final  Culture, respiratory     Status: None (Preliminary result)   Collection Time: 06/23/20  7:24 PM  Result Value Ref Range Status   Specimen Description   Final    EXPECTORATED SPUTUM Performed at [place] [address], [place], [place] [address]    Special Requests   Final    NONE Reflexed from [id number] Performed at [facility] [address], [place], [place] [address]    Gram Stain   Final    ABUNDANT WBC PRESENT, PREDOMINANTLY PMN ABUNDANT GRAM NEGATIVE RODS MODERATE GRAM POSITIVE RODS FEW GRAM POSITIVE COCCI    Culture   Final    ABUNDANT PROVIDENCIA RETTGERI MODERATE KLEBSIELLA PNEUMONIAE SUSCEPTIBILITIES TO FOLLOW WITHIN NORMAL RESPIRATORY FLORA Performed at [facility], [place] [address] [place], [place] [address]    Report Status PENDING  Incomplete  C Difficile Quick Screen w PCR reflex     Status: None   Collection Time: 06/25/20 11:35 AM   Specimen: STOOL  Result Value Ref Range Status   C Diff antigen NEGATIVE NEGATIVE Final   C Diff toxin NEGATIVE NEGATIVE Final   C Diff interpretation No C. difficile detected.  Final    Comment: Performed at [facility], [place] [address] [place], [place] [address]         Radiology Studies: DG Toe 2nd Left  Result Date: 06/25/2020 CLINICAL DATA:  Left second toe pain. EXAM: LEFT SECOND TOE COMPARISON:  None. FINDINGS: Comminuted fracture is seen involving the distal portion of the second proximal phalanx. There is severe dislocation of the second middle phalanx relative to the second proximal phalanx. IMPRESSION: Comminuted fracture is seen involving the distal portion of the second proximal phalanx with severe dislocation of the second middle phalanx relative to the  second proximal phalanx. Electronically Signed   By: Marijo Conception M.D.   On: 06/25/2020 15:28        Scheduled Meds: .  budesonide  0.5 mg Nebulization BID  . doxycycline  100 mg Per Tube Q12H  . enoxaparin (LOVENOX) injection  40 mg Subcutaneous QHS  . feeding supplement (OSMOLITE 1.5 CAL)  474 mL Per Tube QID  . free water  120 mL Per Tube QID  . guaiFENesin  15 mL Per Tube Q6H  . ipratropium-albuterol  3 mL Nebulization TID  . loratadine  10 mg Per Tube Daily  . methadone  120 mg Per Tube Daily  . pantoprazole sodium  40 mg Per Tube Daily  . potassium chloride  40 mEq Per Tube Q4H   Continuous Infusions: . cefTRIAXone (ROCEPHIN)  IV 2 g (06/25/20 2233)  . dextrose 5 % with kcl       LOS: 2 days    Time spent: 35 minutes    Irine Seal, MD [facility]   To contact the attending provider between 7A-7P or the covering provider during after hours 7P-7A, please log into the web site [email or web address] and access using universal [place] password for that web site. If you do not have the password, please call the hospital operator.  06/26/2020, 11:32 AM

## 2020-06-26 NOTE — Telephone Encounter (Signed)
Dr. Posey Pronto is aware and will see this pt in the afternoon.

## 2020-06-26 NOTE — Consult Note (Signed)
   [facility] CM Inpatient Consult   06/26/2020  Ronald Ruiz [birth year] [id number]  Patient chart reviewed for potential [facility] CM) services due to high unplanned readmission risk score.   Per review, current primary provider provides [facility] CM embedded case management services in outpatient setting. Will continue to follow for progression and disposition plans.  Of note, [facility] Care Management services does not replace or interfere with any services that are arranged by inpatient case management or social work.  Netta Cedars, MSN, [facility] Liaison Nurse Mobile Phone [phone]  Toll free office [phone]

## 2020-06-26 NOTE — Consult Note (Signed)
  Subjective:  Patient ID: Ronald Ruiz, male    DOB: Oct 12, 1955,  MRN: [id number]  A 65 y.o. 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  Patient does not recall any acute trauma or injury to the toe.  He denies any other acute complaints he still has pain in the toe.  There is an ulcer present secondary to longstanding dislocation of the toe leading to possible open fracture.  No acute concerns or complaints.  He denies any nausea fever chills vomiting. Objective:   Vitals:   06/26/20 1321 06/26/20 1424  BP: (!) 100/57   Pulse: (!) 57   Resp: 16   Temp: (!) 97.5 F (36.4 C)   SpO2: 100% 99%   General AA&O x3. Normal mood and affect.  Vascular Dorsalis pedis and posterior tibial pulses 2/4 bilat. Brisk capillary refill to all digits. Pedal hair present.  Neurologic Epicritic sensation grossly intact.  Dermatologic  open wound noted to the medial side of the PIPJ joint.  Does not appear that the bone is exposed.  There is a lateral deviation of the middle phalanx on the proximal phalanx.  Cap refill is intact.  No ecchymosis present.  No clinical signs of infection present.  Orthopedic: MMT 5/5 in dorsiflexion, plantarflexion, inversion, and eversion. Normal joint ROM without pain or crepitus.    Assessment & Plan:  Patient was evaluated and treated and all questions answered.  Left second digit PIPJ joint dislocation with underlying medial ulceration -I explained to the patient the etiology of dislocation various treatment options were discussed.  This could likely be an open fracture given that the dislocation may have been present for quite some time.  I  discussed with the patient that he will benefit from reduction of the toe followed by splinting it.  He agrees with the plan would like to proceed with the reduction. -Closed reduction was performed as described: Skin was prepped with alcohol solution one-to-one mixture of half percent Marcaine plain 1% lidocaine plain 10 cc were injected to provide anesthetic block.  Once anesthetic block was obtained, distraction/traction was utilized to reduce second digit/PIPJ joint.  Good reduction correction noted.  The digit was splinted with Coban with hallux to provide stability.  Good correction alignment noted. -No concern for infection at this time.  Patient will need to weight-bear with surgical shoe. -Maintain stability of the joint for next 6 to 8 weeks.  If there is a loss of reduction we will plan on obtaining postreduction x-ray patient may need surgical intervention with pending to hold the reduction.  However given that there is a concern for ulceration to the medial aspect of it patient may also is a high risk of losing the digit as well.  I discussed this with the patient extensive detail he states understanding. -As long as a close reduction films are within normal limits patient to follow-up with her in clinic within 1 week from discharge. -Maintain weightbearing as tolerated in surgical shoe -We will continue to follow and will make addendum to this note if the post reduction films are within normal limits.  [name], DPM  Accessible via secure chat for questions or concerns.

## 2020-06-27 DIAGNOSIS — J189 Pneumonia, unspecified organism: Secondary | ICD-10-CM | POA: Diagnosis not present

## 2020-06-27 DIAGNOSIS — J449 Chronic obstructive pulmonary disease, unspecified: Secondary | ICD-10-CM | POA: Diagnosis not present

## 2020-06-27 DIAGNOSIS — A419 Sepsis, unspecified organism: Secondary | ICD-10-CM | POA: Diagnosis not present

## 2020-06-27 DIAGNOSIS — J9601 Acute respiratory failure with hypoxia: Secondary | ICD-10-CM | POA: Diagnosis not present

## 2020-06-27 LAB — STREP PNEUMONIAE URINARY ANTIGEN: Strep Pneumo Urinary Antigen: NEGATIVE

## 2020-06-27 LAB — CBC WITH DIFFERENTIAL/PLATELET
Abs Immature Granulocytes: 0.03 10*3/uL (ref 0.00–0.07)
Basophils Absolute: 0 10*3/uL (ref 0.0–0.1)
Basophils Relative: 0 %
Eosinophils Absolute: 0.1 10*3/uL (ref 0.0–0.5)
Eosinophils Relative: 2 %
HCT: 25.1 % — ABNORMAL LOW (ref 39.0–52.0)
Hemoglobin: 7.4 g/dL — ABNORMAL LOW (ref 13.0–17.0)
Immature Granulocytes: 0 %
Lymphocytes Relative: 8 %
Lymphs Abs: 0.6 10*3/uL — ABNORMAL LOW (ref 0.7–4.0)
MCH: 26.4 pg (ref 26.0–34.0)
MCHC: 29.5 g/dL — ABNORMAL LOW (ref 30.0–36.0)
MCV: 89.6 fL (ref 80.0–100.0)
Monocytes Absolute: 0.7 10*3/uL (ref 0.1–1.0)
Monocytes Relative: 10 %
Neutro Abs: 5.6 10*3/uL (ref 1.7–7.7)
Neutrophils Relative %: 80 %
Platelets: 232 10*3/uL (ref 150–400)
RBC: 2.8 MIL/uL — ABNORMAL LOW (ref 4.22–5.81)
RDW: 15.8 % — ABNORMAL HIGH (ref 11.5–15.5)
WBC: 7.1 10*3/uL (ref 4.0–10.5)
nRBC: 0 % (ref 0.0–0.2)

## 2020-06-27 LAB — GLUCOSE, CAPILLARY
Glucose-Capillary: 133 mg/dL — ABNORMAL HIGH (ref 70–99)
Glucose-Capillary: 83 mg/dL (ref 70–99)
Glucose-Capillary: 89 mg/dL (ref 70–99)
Glucose-Capillary: 95 mg/dL (ref 70–99)

## 2020-06-27 LAB — CULTURE, RESPIRATORY W GRAM STAIN

## 2020-06-27 LAB — BASIC METABOLIC PANEL
Anion gap: 5 (ref 5–15)
BUN: 20 mg/dL (ref 8–23)
CO2: 26 mmol/L (ref 22–32)
Calcium: 7.8 mg/dL — ABNORMAL LOW (ref 8.9–10.3)
Chloride: 105 mmol/L (ref 98–111)
Creatinine, Ser: 0.57 mg/dL — ABNORMAL LOW (ref 0.61–1.24)
GFR, Estimated: >60 mL/min (ref >60)
Glucose, Bld: 99 mg/dL (ref 70–99)
Potassium: 4.1 mmol/L (ref 3.5–5.1)
Sodium: 136 mmol/L (ref 135–145)

## 2020-06-27 LAB — MAGNESIUM: Magnesium: 1.9 mg/dL (ref 1.7–2.4)

## 2020-06-27 MED ORDER — LEVOFLOXACIN 25 MG/ML PO SOLN
750.0000 mg | Freq: Every day | ORAL | Status: DC
  Administered 2020-06-27 – 2020-07-02 (×6): 750 mg
  Filled 2020-06-27 (×12): qty 30

## 2020-06-27 MED ORDER — IPRATROPIUM-ALBUTEROL 0.5-2.5 (3) MG/3ML IN SOLN
3.0000 mL | Freq: Two times a day (BID) | RESPIRATORY_TRACT | Status: DC
  Administered 2020-06-27 – 2020-06-28 (×2): 3 mL via RESPIRATORY_TRACT
  Filled 2020-06-27 (×2): qty 3

## 2020-06-27 MED ORDER — SODIUM CHLORIDE 0.9 % IV SOLN: INTRAVENOUS | Status: DC

## 2020-06-27 MED ORDER — CEFDINIR 125 MG/5ML PO SUSR
300.0000 mg | Freq: Two times a day (BID) | ORAL | Status: DC
  Administered 2020-06-27: 300 mg
  Filled 2020-06-27: qty 6

## 2020-06-27 MED ORDER — ACETAMINOPHEN 160 MG/5ML PO SOLN
650.0000 mg | Freq: Four times a day (QID) | ORAL | Status: DC | PRN
  Administered 2020-06-27 – 2020-07-10 (×20): 650 mg
  Filled 2020-06-27 (×21): qty 20.3

## 2020-06-27 MED ORDER — ACETAMINOPHEN 650 MG RE SUPP: 650.0000 mg | Freq: Four times a day (QID) | RECTAL | Status: DC | PRN

## 2020-06-27 NOTE — Progress Notes (Signed)
PT denies need for stoma suctioning at this time- educated on importance. Placed PT on new atc set up- uneventful.

## 2020-06-27 NOTE — Progress Notes (Signed)
PROGRESS NOTE    Ronald Ruiz  AOZ:[id number] DOB: 1955-11-14 DOA: 06/23/2020 PCP: [name], MD    Chief Complaint  Patient presents with  . 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 Patient noted to be hypotensive with systolic blood pressures in the 80s, tachycardic with a heart rate of 105, sats of 83% on 10 L O2 via trach collar. Patient given some saline nebs with some improvement. Patient also noted to have a leukocytosis. Chest x-ray done concerning for multifocal pneumonia. Patient received a dose of IV Solu-Medrol, placed empirically on IV antibiotics and hospitalist called for admission.   Assessment & Plan:   Principal Problem:   Severe sepsis ([place]) Active Problems:   Acute respiratory failure with hypoxia (HCC)   Community acquired pneumonia   Malignant neoplasm of glottis (HCC)   Protein-calorie malnutrition, severe   Dysphagia   COPD (chronic obstructive pulmonary disease) (HCC)   Normocytic anemia   Chronic pulmonary aspiration   Diarrhea   Abnormally small toe   Hypernatremia   Closed nondisplaced fracture of lesser toe of left foot with malunion  1 severe sepsis and acute respiratory failure with hypoxia secondary to multifocal pneumonia Patient noted to be hypoxic on admission with sats of 83% on 10 L O2 via trach collar on arrival. Patient also with a leukocytosis, elevated lactic acid level, tachycardic with heart rate up to 105, chest x-ray with multifocal pneumonia. SARS coronavirus PCR negative. Patient pancultured results pending. Urine strep pneumococcus antigen pending, urine Legionella antigen pending. Sputum Gram stain and  cultures with abundant procidentia rettgeri/moderate Klebsiella pneumonia which is resistant to ampicillin and cefazolin but sensitive to cefepime, ceftazidime, Rocephin, ciprofloxacin, gentamicin, Bactrim. Currently on 8 L O2 via trach collar. Patient seems comfortable and slowly improving clinically. Patient with no use of accessory muscles of respiration.  Continue current trach care/trach suctioning, Pulmicort nebs, duo nebs.  Discontinue IV Rocephin and doxycycline and placed on Levaquin to complete a 10-day course of antibiotic treatment.  Discussed with ID, Dr. Johnnye Sima over the phone.  2. COPD Stable. Patient with no active wheezing however patient presented with acute respiratory failure with hypoxia currently on 8 L trach collar with FiO2 of 35%.  Pulmicort nebs, scheduled duo nebs.  Follow.   3. Borderline blood pressures/hypotension Patient with systolic blood pressures in the 100s. Patient denies any dizziness. Patient noted to have baseline systolic blood pressures in the low 100s from prior office visits as noted on epic. Patient pancultured. Patient s/p IV albumin every 6 hours x24 hours. Improvement with blood pressure.  Continue hydration with IV fluids.  Follow.  4. History of throat cancer status post resection and tracheostomy, since decannulated/oropharyngeal dysphagia with chronic pulmonary aspiration now PEG dependent Keep n.p.o. Continue stoma and PEG tube care. Dietitian consulted.  5. Severe protein calorie malnutrition Secondary to chronic illness. Dietitian consulted and following.  On tube feeds.  6. Anemia of chronic disease/iron deficiency anemia Patient with no overt bleeding, hemoglobin trickling down likely due to dilutional effect. Anemia panel consistent with anemia of chronic disease/iron deficiency. Iron level at 13. Hemoglobin currently at 7.4.  Transfusion threshold hemoglobin < 7. May need IV iron towards the end of hospitalization. Will likely need oral iron  supplementation  on discharge.  7. Abnormality of left second toe/left second toe commuted fracture Left second toe dark with some thickening of the skin and tender to palpation.  Plain films done consistent with a commuted fracture involving distal portion of the second proximal phalanx with severe dislocation of the second middle phalanx relative to the second proximal phalanx.  CRP elevated at 9.2.  Sed rate elevated at 70.  Patient on IV antibiotics.  Podiatry consulted and they assessed the patient and performed closed reduction of the left second toe with splinting.  Will order surgical shoe as recommended per podiatry. WBAT.   8. Diarrhea Patient with multiple loose stools per patient and per RN over the past 1 to 2 days.  WBC normalized.  Patient on antibiotics.  C. difficile PCR negative.  Currently on Imodium as needed.  Clinical improvement.  Discontinue rectal tube.  9.  Hypernatremia Likely secondary to dehydration from numerous voluminous diarrhea.  Improved on D5W.  Change IV fluids to normal saline at 100 cc an hour.  Follow.   10.  Hypokalemia Secondary to GI losses.  Repleted.    DVT prophylaxis: Lovenox Code Status: Full Family Communication: Updated patient.  No family at bedside.  Disposition:   Status is: Inpatient    Dispo: The patient is from:               Anticipated d/c is to: Home              Anticipated d/c date is: 2-3 days               Patient currently with multifocal pneumonia, with stoma, cold/borderline blood pressure, on IV antibiotics.  Not stable for discharge.    Difficult to place patient undetermined       Consultants:   Podiatry: Dr. Posey Pronto 06/26/2020  Close reduction of left second toe with splinting per Dr. Posey Pronto 06/26/2020  Procedures:   Chest x-ray 06/23/2020  Plain films of left second toe 06/25/2020  Antimicrobials:   Oral doxycycline to 06/23/2020>>>> 06/27/2020  IV cefepime 06/23/2018 22x1 dose  IV Rocephin to 06/23/2020>>>>  06/27/2020  IV vancomycin x1 dose  Omnicef 06/27/2020 x1 dose>>>> 06/27/2020  Oral Levaquin 06/27/2020   Subjective: Sitting up in bed.  Feels shortness of breath is improving.  Secretions from stoma decreasing per patient.  No chest pain.  Denies any further diarrhea or loose stools.    Objective: Vitals:   06/26/20 1838 06/26/20 2116 06/27/20 0534 06/27/20 0824  BP:  103/63 115/65   Pulse:  79 60   Resp:  17 16   Temp:  98.5 F (36.9 C) 98.6 F (37 C)   TempSrc:  Oral Oral   SpO2: 97% 95% 96% 95%  Weight:      Height:        Intake/Output Summary (Last 24 hours) at 06/27/2020 1137 Last data filed at 06/27/2020 0950 Gross per 24 hour  Intake 2082.21 ml  Output 3100 ml  Net -1017.79 ml   Filed Weights   06/23/20 1826 06/25/20 0616 06/26/20 0528  Weight: 49.9 kg 54 kg 52.3 kg    Examination:  General exam: Cachectic.  Frail.  Chronically ill-appearing.  Trach collar on.  Stoma.  Respiratory system: Some decreased breath sounds in the bases otherwise lungs clear to auscultation bilaterally.  No wheezing, no crackles.  Fair air movement.  Cardiovascular system: Regular rate rhythm no murmurs rubs or gallops.  No JVD.  No lower extremity edema.  Gastrointestinal system:  Abdomen is soft, nontender, nondistended, positive bowel sounds.  No rebound.  No guarding.  PEG tube in place.  Central nervous system: Alert and oriented. No focal neurological deficits. Extremities: Left second toe dark/?  Bruising, skin thickening on the medial side of the toe, tender to palpation.  Skin: No rashes, lesions or ulcers Psychiatry: Judgement and insight appear normal. Mood & affect appropriate.     Data Reviewed: I have personally reviewed following labs and imaging studies  CBC: Recent Labs  Lab 06/23/20 1510 06/24/20 0331 06/25/20 0316 06/25/20 1652 06/26/20 0322 06/27/20 0318  WBC 12.5* 9.5 8.4  --  8.2 7.1  NEUTROABS 11.3*  --  7.2  --  7.1 5.6  HGB 9.6* 8.5* 7.5* 8.0* 8.3* 7.4*   HCT 31.8* 27.9* 25.6* 26.9* 28.0* 25.1*  MCV 89.1 88.3 89.8  --  89.5 89.6  PLT 350 289 278  --  285 [id number]    Basic Metabolic Panel: Recent Labs  Lab 06/23/20 1510 06/24/20 0331 06/24/20 1550 06/25/20 0316 06/25/20 1652 06/26/20 0322 06/27/20 0318  NA 143 142  --  144  --  148* 136  K 3.8 4.0  --  4.0  --  3.0* 4.1  CL 105 104  --  107  --  110 105  CO2 27 26  --  26  --  28 26  GLUCOSE 97 91  --  70  --  96 99  BUN 32* 36*  --  38*  --  24* 20  CREATININE 0.87 0.81  --  0.82  --  0.70 0.57*  CALCIUM 8.8* 8.4*  --  8.4*  --  8.3* 7.8*  MG  --   --  2.0 2.1 2.0 2.1 1.9  PHOS  --   --  5.1* 3.1 1.9* 2.7  --     GFR: Estimated Creatinine Clearance: 69 mL/min (A) (by C-G formula based on SCr of 0.57 mg/dL (L)).  Liver Function Tests: Recent Labs  Lab 06/23/20 1510 06/25/20 0316  AST 22 23  ALT 14 15  ALKPHOS 92 65  BILITOT 0.4 0.3  PROT 6.9 6.1*  ALBUMIN 2.0* 2.5*    CBG: Recent Labs  Lab 06/26/20 0529 06/26/20 1155 06/26/20 1825 06/26/20 2355 06/27/20 0530  GLUCAP 69* 135* 95 110* 95     Recent Results (from the past 240 hour(s))  SARS Coronavirus 2 by RT PCR (hospital order, performed in [facility] hospital lab) Nasopharyngeal Nasopharyngeal Swab     Status: None   Collection Time: 06/23/20  3:10 PM   Specimen: Nasopharyngeal Swab  Result Value Ref Range Status   SARS Coronavirus 2 NEGATIVE NEGATIVE Final    Comment: (NOTE) SARS-CoV-2 target nucleic acids are NOT DETECTED.  The SARS-CoV-2 RNA is generally detectable in upper and lower respiratory specimens during the acute phase of infection. The lowest concentration of SARS-CoV-2 viral copies this assay can detect is 250 copies / mL. A negative result does not preclude SARS-CoV-2 infection and should not be used as the sole basis for treatment or other patient management decisions.  A negative result may occur with improper specimen collection / handling, submission of specimen other than  nasopharyngeal swab, presence of viral mutation(s) within the areas targeted by this assay, and inadequate number of viral copies (<250 copies / mL). A negative result must be combined with clinical observations, patient history, and epidemiological information.  Fact Sheet for Patients:   StrictlyIdeas.no  Fact Sheet for Healthcare Providers: BankingDealers.co.za  This  test is not yet approved or  cleared by the [place] FDA and has been authorized for detection and/or diagnosis of SARS-CoV-2 by FDA under an Emergency Use Authorization (EUA).  This EUA will remain in effect (meaning this test can be used) for the duration of the COVID-19 declaration under Section 564(b)(1) of the Act, 21 U.S.C. section 360bbb-3(b)(1), unless the authorization is terminated or revoked sooner.  Performed at [facility], [address] [place], [place] [address]   Culture, blood (routine x 2)     Status: None (Preliminary result)   Collection Time: 06/23/20  6:13 PM   Specimen: BLOOD  Result Value Ref Range Status   Specimen Description   Final    BLOOD LEFT ANTECUBITAL Performed at [facility], [address] [place], [place] [address]    Special Requests   Final    BOTTLES DRAWN AEROBIC AND ANAEROBIC Blood Culture results may not be optimal due to an inadequate volume of blood received in culture bottles Performed at [facility], [address] [place], [place] [address]    Culture   Final    NO GROWTH 4 DAYS Performed at [facility], [address] [place], [place] [address]    Report Status PENDING  Incomplete  Culture, blood (routine x 2)     Status: None (Preliminary result)   Collection Time: 06/23/20  6:38 PM   Specimen: BLOOD  Result Value Ref Range Status   Specimen Description   Final    BLOOD RIGHT ANTECUBITAL Performed at [facility], [address] [place], [place] [address]    Special Requests   Final    BOTTLES DRAWN AEROBIC AND ANAEROBIC Blood Culture results may not be optimal due to an inadequate volume of blood received in culture bottles Performed at [facility], [address] [place], [place] [address]    Culture   Final    NO GROWTH 4 DAYS Performed at [facility], [address] [place], [place] [address]    Report Status PENDING  Incomplete  Culture, sputum-assessment     Status: None   Collection Time: 06/23/20  7:24 PM   Specimen: Expectorated Sputum  Result Value Ref Range Status   Specimen Description EXPECTORATED SPUTUM  Final   Special Requests NONE  Final   Sputum evaluation   Final    THIS SPECIMEN IS ACCEPTABLE FOR SPUTUM CULTURE Performed at [facility], [address] [place], [place] [address]    Report Status 06/23/2020 FINAL  Final  Culture, respiratory     Status: None   Collection Time: 06/23/20  7:24 PM  Result Value Ref Range Status   Specimen Description   Final    EXPECTORATED SPUTUM Performed at [facility], [address] [place], [place] [address]    Special Requests   Final    NONE Reflexed from [id number] Performed at [facility], [address] [place], [place] [address]    Gram Stain   Final    ABUNDANT WBC PRESENT, PREDOMINANTLY PMN ABUNDANT GRAM NEGATIVE RODS MODERATE GRAM POSITIVE RODS FEW GRAM POSITIVE COCCI    Culture   Final    ABUNDANT PROVIDENCIA RETTGERI MODERATE KLEBSIELLA PNEUMONIAE WITHIN NORMAL RESPIRATORY FLORA Performed at [facility], [address] [place], [place] [address]    Report Status 06/27/2020 FINAL  Final   Organism ID, Bacteria PROVIDENCIA RETTGERI  Final   Organism ID, Bacteria KLEBSIELLA PNEUMONIAE  Final      Susceptibility   Klebsiella pneumoniae - MIC*    AMPICILLIN >=32 RESISTANT Resistant     CEFAZOLIN <=4 SENSITIVE Sensitive      CEFEPIME <=0.12 SENSITIVE Sensitive     CEFTAZIDIME <=1 SENSITIVE Sensitive     CEFTRIAXONE <=0.25 SENSITIVE Sensitive     CIPROFLOXACIN <=0.25 SENSITIVE Sensitive     GENTAMICIN <=1 SENSITIVE Sensitive     IMIPENEM <=0.25 SENSITIVE Sensitive     TRIMETH/SULFA <=20 SENSITIVE Sensitive     AMPICILLIN/SULBACTAM 4 SENSITIVE Sensitive     PIP/TAZO <=4 SENSITIVE Sensitive     * MODERATE KLEBSIELLA PNEUMONIAE   Providencia rettgeri - MIC*    AMPICILLIN >=32 RESISTANT Resistant     CEFAZOLIN >=64 RESISTANT Resistant     CEFEPIME 1 SENSITIVE Sensitive     CEFTAZIDIME <=1 SENSITIVE Sensitive     CEFTRIAXONE <=0.25 SENSITIVE Sensitive     CIPROFLOXACIN <=0.25 SENSITIVE Sensitive     GENTAMICIN <=1 SENSITIVE Sensitive     IMIPENEM 1 SENSITIVE Sensitive     TRIMETH/SULFA <=20 SENSITIVE Sensitive     AMPICILLIN/SULBACTAM 16 INTERMEDIATE Intermediate     PIP/TAZO >=128 RESISTANT Resistant     * ABUNDANT PROVIDENCIA RETTGERI  C Difficile Quick Screen w PCR reflex     Status: None   Collection Time: 06/25/20 11:35 AM   Specimen: STOOL  Result Value Ref Range Status   C Diff antigen NEGATIVE NEGATIVE Final   C Diff toxin NEGATIVE NEGATIVE Final   C Diff interpretation No C. difficile detected.  Final    Comment: Performed at [facility], [place] [address], [place], [place] [address]         Radiology Studies: DG Foot Complete Left  Result Date: 06/26/2020 CLINICAL DATA:  65 year old male status post reduction of second toe fracture. EXAM: LEFT FOOT - COMPLETE 3+ VIEW COMPARISON:  Earlier radiograph dated 06/25/2020. FINDINGS: Persistent displaced fracture of the proximal phalanx of the second toe with probable impaction. Apparent cortical irregularity of the base of the proximal phalanx of the third toe, age indeterminate. There is no dislocation. Mild osteopenia. There is cortical irregularity of the head of the first metatarsal and mild hallux valgus. The soft tissues are  unremarkable. IMPRESSION: 1. Persistent displaced fracture of the proximal phalanx of the second toe. 2. Cortical irregularity of the base of the proximal phalanx of the third toe, age indeterminate. Electronically Signed   By: Anner Crete M.D.   On: 06/26/2020 19:13   DG Toe 2nd Left  Result Date: 06/25/2020 CLINICAL DATA:  Left second toe pain. EXAM: LEFT SECOND TOE COMPARISON:  None. FINDINGS: Comminuted fracture is seen involving the distal portion of the second proximal phalanx. There is severe dislocation of the second middle phalanx relative to the second proximal phalanx. IMPRESSION: Comminuted fracture is seen involving the distal portion of the second proximal phalanx with severe dislocation of the second middle phalanx relative to the second proximal phalanx. Electronically Signed   By: Marijo Conception M.D.   On: 06/25/2020 15:28        Scheduled Meds: . budesonide  0.5 mg Nebulization BID  . doxycycline  100 mg Per Tube Q12H  . enoxaparin (LOVENOX) injection  40 mg Subcutaneous QHS  . feeding supplement ([employer] [place] STANDARD 1.4)  325 mL Per Tube 5 X Daily  . feeding supplement (PROSource TF)  45 mL Per Tube TID  . free  water  240 mL Per Tube 5 X Daily  . guaiFENesin  15 mL Per Tube Q6H  . ipratropium-albuterol  3 mL Nebulization BID  . loratadine  10 mg Per Tube Daily  . methadone  120 mg Per Tube Daily  . pantoprazole sodium  40 mg Per Tube Daily   Continuous Infusions: . sodium chloride    . cefTRIAXone (ROCEPHIN)  IV 2 g (06/26/20 2102)     LOS: 3 days    Time spent: 40 minutes    Irine Seal, MD [facility]   To contact the attending provider between 7A-7P or the covering provider during after hours 7P-7A, please log into the web site [email or web address] and access using universal [place] password for that web site. If you do not have the password, please call the hospital operator.  06/27/2020, 11:37 AM

## 2020-06-27 NOTE — Progress Notes (Signed)
Encouraged stoma suctioning- PT denies need at this time.

## 2020-06-27 NOTE — Plan of Care (Signed)
  Problem: Education: Goal: Knowledge of General Education information will improve Description: Including pain rating scale, medication(s)/side effects and non-pharmacologic comfort measures Outcome: Progressing   Problem: Health Behavior/Discharge Planning: Goal: Ability to manage health-related needs will improve Outcome: Progressing   Problem: Pain Managment: Goal: General experience of comfort will improve Outcome: Progressing   

## 2020-06-28 DIAGNOSIS — J9601 Acute respiratory failure with hypoxia: Secondary | ICD-10-CM | POA: Diagnosis not present

## 2020-06-28 DIAGNOSIS — R6520 Severe sepsis without septic shock: Secondary | ICD-10-CM

## 2020-06-28 DIAGNOSIS — M86672 Other chronic osteomyelitis, left ankle and foot: Secondary | ICD-10-CM

## 2020-06-28 DIAGNOSIS — J449 Chronic obstructive pulmonary disease, unspecified: Secondary | ICD-10-CM | POA: Diagnosis not present

## 2020-06-28 DIAGNOSIS — M869 Osteomyelitis, unspecified: Secondary | ICD-10-CM

## 2020-06-28 DIAGNOSIS — A419 Sepsis, unspecified organism: Secondary | ICD-10-CM | POA: Diagnosis not present

## 2020-06-28 DIAGNOSIS — J189 Pneumonia, unspecified organism: Secondary | ICD-10-CM | POA: Diagnosis not present

## 2020-06-28 LAB — CBC WITH DIFFERENTIAL/PLATELET
Abs Immature Granulocytes: 0.03 10*3/uL (ref 0.00–0.07)
Basophils Absolute: 0 10*3/uL (ref 0.0–0.1)
Basophils Relative: 0 %
Eosinophils Absolute: 0.2 10*3/uL (ref 0.0–0.5)
Eosinophils Relative: 3 %
HCT: 23.8 % — ABNORMAL LOW (ref 39.0–52.0)
Hemoglobin: 7.3 g/dL — ABNORMAL LOW (ref 13.0–17.0)
Immature Granulocytes: 1 %
Lymphocytes Relative: 10 %
Lymphs Abs: 0.6 10*3/uL — ABNORMAL LOW (ref 0.7–4.0)
MCH: 26.7 pg (ref 26.0–34.0)
MCHC: 30.7 g/dL (ref 30.0–36.0)
MCV: 87.2 fL (ref 80.0–100.0)
Monocytes Absolute: 0.7 10*3/uL (ref 0.1–1.0)
Monocytes Relative: 11 %
Neutro Abs: 4.6 10*3/uL (ref 1.7–7.7)
Neutrophils Relative %: 75 %
Platelets: 212 10*3/uL (ref 150–400)
RBC: 2.73 MIL/uL — ABNORMAL LOW (ref 4.22–5.81)
RDW: 15.7 % — ABNORMAL HIGH (ref 11.5–15.5)
WBC: 6.1 10*3/uL (ref 4.0–10.5)
nRBC: 0 % (ref 0.0–0.2)

## 2020-06-28 LAB — BASIC METABOLIC PANEL
Anion gap: 8 (ref 5–15)
BUN: 25 mg/dL — ABNORMAL HIGH (ref 8–23)
CO2: 30 mmol/L (ref 22–32)
Calcium: 8 mg/dL — ABNORMAL LOW (ref 8.9–10.3)
Chloride: 101 mmol/L (ref 98–111)
Creatinine, Ser: 0.55 mg/dL — ABNORMAL LOW (ref 0.61–1.24)
GFR, Estimated: >60 mL/min (ref >60)
Glucose, Bld: 93 mg/dL (ref 70–99)
Potassium: 4 mmol/L (ref 3.5–5.1)
Sodium: 139 mmol/L (ref 135–145)

## 2020-06-28 LAB — CULTURE, BLOOD (ROUTINE X 2)
Culture: NO GROWTH
Culture: NO GROWTH

## 2020-06-28 LAB — GLUCOSE, CAPILLARY
Glucose-Capillary: 67 mg/dL — ABNORMAL LOW (ref 70–99)
Glucose-Capillary: 78 mg/dL (ref 70–99)
Glucose-Capillary: 88 mg/dL (ref 70–99)
Glucose-Capillary: 89 mg/dL (ref 70–99)
Glucose-Capillary: 90 mg/dL (ref 70–99)

## 2020-06-28 LAB — SURGICAL PCR SCREEN
MRSA, PCR: NEGATIVE
Staphylococcus aureus: NEGATIVE

## 2020-06-28 LAB — PREPARE RBC (CROSSMATCH)

## 2020-06-28 MED ORDER — ACETAMINOPHEN 325 MG PO TABS: 650.0000 mg | ORAL_TABLET | Freq: Once | ORAL | Status: AC

## 2020-06-28 MED ORDER — ARFORMOTEROL TARTRATE 15 MCG/2ML IN NEBU
15.0000 ug | INHALATION_SOLUTION | Freq: Two times a day (BID) | RESPIRATORY_TRACT | Status: DC
  Administered 2020-06-28 – 2020-07-10 (×24): 15 ug via RESPIRATORY_TRACT
  Filled 2020-06-28 (×24): qty 2

## 2020-06-28 MED ORDER — DEXTROSE-NACL 5-0.9 % IV SOLN: INTRAVENOUS | Status: DC

## 2020-06-28 MED ORDER — OXYCODONE HCL 5 MG/5ML PO SOLN
5.0000 mg | ORAL | Status: DC | PRN
  Administered 2020-06-28 – 2020-07-10 (×50): 5 mg
  Filled 2020-06-28 (×53): qty 5

## 2020-06-28 MED ORDER — REVEFENACIN 175 MCG/3ML IN SOLN
175.0000 ug | Freq: Every day | RESPIRATORY_TRACT | Status: DC
  Administered 2020-06-29 – 2020-07-10 (×12): 175 ug via RESPIRATORY_TRACT
  Filled 2020-06-28 (×13): qty 3

## 2020-06-28 MED ORDER — SODIUM CHLORIDE 0.9% IV SOLUTION: Freq: Once | INTRAVENOUS | Status: DC

## 2020-06-28 MED ORDER — DIPHENHYDRAMINE HCL 25 MG PO CAPS
25.0000 mg | ORAL_CAPSULE | Freq: Once | ORAL | Status: AC
  Administered 2020-06-28: 25 mg
  Filled 2020-06-28: qty 1

## 2020-06-28 NOTE — Consult Note (Signed)
NAME:  Ronald Ruiz, MRN:  [id number], DOB:  [birth year], LOS: 4 ADMISSION DATE:  06/23/2020, CONSULTATION DATE: 06/28/2020 REFERRING MD: Dr. Marcello Moores, CHIEF COMPLAINT: Respiratory perioperative valuation  Brief History:  This is a 65 year old gentleman, past medical history of COPD, history of laryngeal cancer status post tracheostomy tube.  Pulmonary consulted for recommendations and management.  For podiatry amputation planned.  History of Present Illness:  65 year old gentleman, past medical history of COPD, history of laryngeal cancer, status post tracheostomy tube with chronic respiratory failure.  Patient being evaluated for perioperative management and respiratory risk stratification for planned amputation.  Patient was seen today by podiatry.  Patient has a left second digit dislocation with underlying possible osteomyelitis.  They are planning for surgery tomorrow here at [facility].  Hospitalist have asked for consultation regarding respiratory status.  Patient has history of radiation therapy, oropharyngeal dysphagia and chronic pulmonary aspiration.  At this point is PEG dependent.  Also has chronic substance abuse disorder on methadone.  Patient was found to have multifocal pneumonia, COVID-19 negative.  Currently on trach collar.  Patient was seen by infectious disease recommended treatment with Levaquin x10 days.  Currently weaning from his trach collar also receiving Pulmicort and duo nebs.  Initial chest x-ray on 06/23/2020 reviewed which revealed multifocal infiltrates.  Outpatient: Patient follows with Dr. Isidore Moos from radiation oncology, Dr. Benjamine Mola from ENT and Dr. Vaughan Browner from pulmonary  Past Medical History:   Past Medical History:  Diagnosis Date  . Bronchitis 03/2018  . COPD (chronic obstructive pulmonary disease) ([place])   . Herpes   . laryngeal ca dx'd 06/2018  . Lung nodule 2020  . Substance abuse ([place])    opiate addiction, been on methadone for 11 years     Significant  Hospital Events:  06/29/2020: Planned operative procedure for left foot  02/2020: CT Chest  No pneumothorax. Trace right pleural effusion, new. No left pleural effusion. Moderate centrilobular and paraseptal emphysema with mild diffuse bronchial wall thickening. Previously visualized nodular focus of consolidation in the medial basilar right lower lobe is now cavitary and decreased to 1.1 x 1.1 cm (series 3/image 122), previously 2.4 x 2.2 cm. There is a new large irregular masslike focus of consolidation in the peripheral right lower lobe measuring 8.8 x 4.0 cm (series 3/image 118) with associated volume loss and distortion. Previously visualized numerous irregular bandlike and nodular foci of consolidation throughout the bilateral upper lobes (series 3/image 60 on the right and image 65 on the left), medial right middle lobe (series 3/image 79) and peripheral left lower lobe (series 3/image 109) are unchanged and most compatible with postinfectious scarring. Solid 0.9 cm right middle lobe pulmonary nodule (series 3/image 129), stable. No additional new significant pulmonary nodules.  Consults:  Pulmonary critical care Podiatry  Procedures:    Significant Diagnostic Tests:  06/23/2020 chest x-ray: Multifocal pneumonia. The patient's images have been independently reviewed by me.    Micro Data:  06/23/2020: COVID-19 negative Tracheal aspirate 06/23/2020: [place] E+ Klebsiella, both with mixed resistance patterns  Antimicrobials:  Levofloxacin  Interim History / Subjective:  Per HPI above  Objective   Blood pressure (!) 97/58, pulse 79, temperature 98.2 F (36.8 C), temperature source Oral, resp. rate 18, height 6' (1.829 m), weight 52.3 kg, SpO2 95 %.    FiO2 (%):  [35 %] 35 %   Intake/Output Summary (Last 24 hours) at 06/28/2020 1636 Last data filed at 06/28/2020 1130 Gross per 24 hour  Intake 1194.96 ml  Output 3325  ml  Net -2130.04 ml   Filed Weights   06/23/20 1826  06/25/20 0616 06/26/20 0528  Weight: 49.9 kg 54 kg 52.3 kg    Examination: General: Chronically ill-appearing cachectic debilitated gentleman HENT: Chronic tracheal stoma, TCT in place Lungs: Diminished breath sounds bilaterally, little movement Cardiovascular: Regular rate rhythm S1-S2 Abdomen: PEG tube in place, flat Extremities: Severe muscle wasting cachexia Neuro: Alert oriented following commands GU: Deferred  Resolved Hospital Problem list     Assessment & Plan:   Acute on chronic hypoxemic respiratory failure Multifocal pneumonia Osteomyelitis left foot Severe sepsis secondary to above COPD, baseline, no prior pulmonary function test, CT imaging with centrilobular and paraseptal emphysema Severe calorie protein malnutrition History of throat cancer status post resection, tracheostomy, now decannulated with recurrent oropharyngeal dysphagia, chronic pulmonary aspiration and PEG tube dependence Chronic consolidations prior cavitary lesion on CT in October 2021 Severe muscle wasting and cachexia  Plan:  Change nebulizer treatments to Pulmicort plus Brovana plus Yupelri As needed albuterol If needed could consider short course of tapering steroids. Would consider repeat noncontrasted CT of the chest at some point during hospitalization and this is not needed prior to surgery.  Please see documented perioperative risk stratification calculator results below:  1) RISK FOR PROLONGED MECHANICAL VENTILAION - > 48h  1A) Arozullah - Prolonged mech ventilation risk Arozullah Postperative Pulmonary Risk Score - for mech ventilation dependence >48h [employer], Ann Surg 2000, major non-cardiac surgery) Comment Score  Type of surgery - abd ao aneurysm (27), thoracic (21), neurosurgery / upper abdominal / vascular (21), neck (11) Foot amp 0  Emergency Surgery - (11)  0  ALbumin < 3 or poor nutritional state - (9)  9  BUN > 30 -  (8)  0  Partial or completely dependent  functional status - (7)  7  COPD -  (6)  6  Age - 60 to 61 (4), > 70  (6)  4  TOTAL    Risk Stratifcation scores  - < 10 (0.5%), 11-19 (1.8%), 20-27 (4.2%), 28-40 (10.1%), >40 (26.6%)  26   Result: 4.2% risk of prolonged mechanical ventilation greater than 48 hours   1B) GUPTA - Prolonged Mech Vent Risk Score source Risk  Guptal post op prolonged mech ventilation > 48h or reintubation < 30 days - ACS 2007-2008 dataset - http://lewis-perez.info/ 1.6%    2) RISK FOR POST OP PNEUMONIA Score source Risk  Lyndel Safe - Post Op Pnemounia risk  [employer] 2.0%    R3) ISK FOR ANY POST-OP PULMONARY COMPLICATION Score source Risk  CANET/ARISCAT Score - risk for ANY/ALl pulmonary complications - > risk of in-hospital post-op pulmonary complications (composite including respiratory failure, respiratory infection, pleural effusion, atelectasis, pneumothorax, bronchospasm, aspiration pneumonitis) [employer] - based on age, anemia, pulse ox, resp infection prior 30d, incision site, duration of surgery, and emergency v elective surgery High RISK   42.1% risk of in-hospital post-op pulmonary complications (composite including respiratory failure, respiratory infection, pleural effusion, atelectasis, pneumothorax, bronchospasm, aspiration pneumonitis)    Based on summary of risk calculated results as described above.  Patient is high risk for perioperative pulmonary complications due to his medical comorbidities however is low risk for prolonged mechanical vent support need or development of postoperative pneumonia (of note currently carries diagnosis of pneumonia going into operative procedure)     Labs   CBC: Recent Labs  Lab 06/23/20 1510 06/24/20 0331 06/25/20 0316 06/25/20 1652 06/26/20 0322 06/27/20 0318 06/28/20 0325  WBC 12.5* 9.5 8.4  --  8.2 7.1 6.1  NEUTROABS 11.3*  --  7.2  --  7.1 5.6 4.6  HGB 9.6* 8.5* 7.5* 8.0* 8.3* 7.4* 7.3*  HCT 31.8* 27.9* 25.6* 26.9* 28.0* 25.1* 23.8*  MCV 89.1 88.3 89.8  --  89.5 89.6 87.2  PLT 350 289 278  --  285 232 893    Basic Metabolic Panel: Recent Labs  Lab 06/24/20 0331 06/24/20 1550 06/25/20 0316 06/25/20 1652 06/26/20 0322 06/27/20 0318 06/28/20 0325  NA 142  --  144  --  148* 136 139  K 4.0  --  4.0  --  3.0* 4.1 4.0  CL 104  --  107  --  110 105 101  CO2 26  --  26  --  28 26 30   GLUCOSE 91  --  70  --  96 99 93  BUN 36*  --  38*  --  24* 20 25*  CREATININE 0.81  --  0.82  --  0.70 0.57* 0.55*  CALCIUM 8.4*  --  8.4*  --  8.3* 7.8* 8.0*  MG  --  2.0 2.1 2.0 2.1 1.9  --   PHOS  --  5.1* 3.1 1.9* 2.7  --   --    GFR: Estimated Creatinine Clearance: 69 mL/min (A) (by C-G formula based on SCr of 0.55 mg/dL (L)). Recent Labs  Lab 06/23/20 1510 06/23/20 1838 06/24/20 0331 06/25/20 0316 06/26/20 0322 06/27/20 0318 06/28/20 0325  PROCALCITON  --   --  0.71  --   --   --   --   WBC 12.5*  --  9.5 8.4 8.2 7.1 6.1  LATICACIDVEN 2.3* 1.7  --   --   --   --   --     Liver Function Tests: Recent Labs  Lab 06/23/20 1510 06/25/20 0316  AST 22 23  ALT 14 15  ALKPHOS 92 65  BILITOT 0.4 0.3  PROT 6.9 6.1*  ALBUMIN 2.0* 2.5*   No results for input(s): LIPASE, AMYLASE in the last 168 hours. No results for input(s): AMMONIA in the last 168 hours.  ABG No results found for: PHART, PCO2ART, PO2ART, HCO3, TCO2, ACIDBASEDEF, O2SAT   Coagulation Profile: No results for input(s): INR, PROTIME in the last 168 hours.  Cardiac Enzymes: No results for input(s): CKTOTAL, CKMB, CKMBINDEX, TROPONINI in the last 168 hours.  HbA1C: No results found for: HGBA1C  CBG: Recent Labs  Lab 06/27/20 1727 06/27/20 2353 06/28/20 0556 06/28/20 0700 06/28/20 1141  GLUCAP 133* 83 67* 90 78    Review of Systems:   Review of Systems  Constitutional: Negative for chills, fever,  malaise/fatigue and weight loss.  HENT: Negative for hearing loss, sore throat and tinnitus.   Eyes: Negative for blurred vision and double vision.  Respiratory: Positive for cough, sputum production and shortness of breath. Negative for hemoptysis, wheezing and stridor.   Cardiovascular: Negative for chest pain, palpitations, orthopnea, leg swelling and PND.  Gastrointestinal: Negative for abdominal pain, constipation, diarrhea, heartburn, nausea and vomiting.  Genitourinary: Negative for dysuria, hematuria and urgency.  Musculoskeletal: Negative for joint pain and myalgias.  Skin: Negative for itching and rash.  Neurological: Negative for dizziness, tingling, weakness and headaches.  Endo/Heme/Allergies: Negative for environmental allergies. Does not bruise/bleed easily.  Psychiatric/Behavioral: Negative for depression. The patient is not nervous/anxious and does not have insomnia.   All other systems reviewed and are negative.    Past Medical History:  He,  has a past medical history of  Bronchitis (03/2018), COPD (chronic obstructive pulmonary disease) ([place]), Herpes, laryngeal ca (dx'd 06/2018), Lung nodule (2020), and Substance abuse ([place]).   Surgical History:   Past Surgical History:  Procedure Laterality Date  . DIRECT LARYNGOSCOPY N/A 07/21/2018   Procedure: DIRECT LARYNGOSCOPY, TRACHEOSTOMY, BIOPSY;  Surgeon: Leta Baptist, MD;  Location: [place];  Service: ENT;  Laterality: N/A;  . IR GASTROSTOMY TUBE MOD SED  08/02/2018     Social History:   reports that he has quit smoking. His smoking use included cigarettes. He has a 100.00 pack-year smoking history. He has never used smokeless tobacco. He reports previous alcohol use. He reports previous drug use.   Family History:  His family history includes Alcohol abuse in his father; Cancer in his father; Cirrhosis in his father; Drug abuse in his brother.   Allergies No Known Allergies   Home Medications  Prior to Admission medications    Medication Sig Start Date End Date Taking? Authorizing Provider  albuterol (PROVENTIL HFA;VENTOLIN HFA) 108 (90 Base) MCG/ACT inhaler Inhale 1-2 puffs into the lungs every 6 (six) hours as needed for wheezing.  03/30/18 06/24/20 Yes [provider]  budesonide (PULMICORT) 0.5 MG/2ML nebulizer solution INHALE CONTENTS OF 1 VIAL VIA NEBULIZER 2 TIMES DAILY Patient taking differently: Take 0.5 mg by nebulization 2 (two) times daily. 10/02/18  Yes Mannam, Praveen, MD  ipratropium-albuterol (DUONEB) 0.5-2.5 (3) MG/3ML SOLN TAKE 3 MLS BY NEBULIZATION EVERY 6 (SIX) HOURS AS NEEDED. Patient taking differently: Take 3 mLs by nebulization every 6 (six) hours as needed (shortness of breath). 07/31/19  Yes Mannam, Praveen, MD  methadone (DOLOPHINE) 10 MG/ML solution Place 120 mg into feeding tube daily.   Yes [provider]  Water For Irrigation, Sterile (FREE WATER) SOLN Place 300 mLs into feeding tube every 6 (six) hours. 07/04/19  Yes Elodia Florence., MD  Nutritional Supplements (FEEDING SUPPLEMENT, OSMOLITE 1.5 CAL,) LIQD Give 410 ml 4 times daily via feeding tube (7 cartons daily needed) Patient not taking: Reported on 06/23/2020 09/25/18   [name], MD  Nutritional Supplements (PROMOD) LIQD Give 30 mL Promod twice daily via PEG. 08/22/18   [name], MD     Garner Nash, [place] [facility] 06/28/2020 4:56 PM

## 2020-06-28 NOTE — Anesthesia Preprocedure Evaluation (Addendum)
Anesthesia Evaluation  Patient identified by MRN, date of birth, ID band Patient awake    Reviewed: Allergy & Precautions, NPO status , Patient's Chart, lab work & pertinent test results  Airway Mallampati: Trach  TM Distance: >3 FB Neck ROM: Full    Dental  (+) Edentulous Upper, Edentulous Lower   Pulmonary pneumonia, COPD, former smoker,    Pulmonary exam normal breath sounds clear to auscultation       Cardiovascular negative cardio ROS Normal cardiovascular exam Rhythm:Regular Rate:Normal     Neuro/Psych negative neurological ROS     GI/Hepatic negative GI ROS, Neg liver ROS,   Endo/Other  negative endocrine ROS  Renal/GU Renal disease     Musculoskeletal negative musculoskeletal ROS (+)   Abdominal   Peds  Hematology  (+) anemia ,   Anesthesia Other Findings   Reproductive/Obstetrics                            Anesthesia Physical Anesthesia Plan  ASA: IV  Anesthesia Plan: MAC   Post-op Pain Management:    Induction: Intravenous  PONV Risk Score and Plan: 1 and Propofol infusion, Treatment may vary due to age or medical condition and Ondansetron  Airway Management Planned:   Additional Equipment: None  Intra-op Plan:   Post-operative Plan:   Informed Consent: I have reviewed the patients History and Physical, chart, labs and discussed the procedure including the risks, benefits and alternatives for the proposed anesthesia with the patient or authorized representative who has indicated his/her understanding and acceptance.     Dental advisory given  Plan Discussed with: CRNA  Anesthesia Plan Comments:        Anesthesia Quick Evaluation

## 2020-06-28 NOTE — Plan of Care (Signed)
  Problem: Education: Goal: Knowledge of General Education information will improve Description: Including pain rating scale, medication(s)/side effects and non-pharmacologic comfort measures Outcome: Progressing   Problem: Coping: Goal: Level of anxiety will decrease Outcome: Progressing   Problem: Elimination: Goal: Will not experience complications related to urinary retention Outcome: Progressing   Problem: Pain Managment: Goal: General experience of comfort will improve Outcome: Progressing

## 2020-06-28 NOTE — Progress Notes (Signed)
Denies need for stoma suctioning at this time.

## 2020-06-28 NOTE — Progress Notes (Signed)
PT denies need for stoma suctioning at this time. 

## 2020-06-28 NOTE — Progress Notes (Signed)
Provided new atc for pt- uneventful.

## 2020-06-28 NOTE — Consult Note (Signed)
  Subjective:  Patient ID: Ronald Ruiz, male    DOB: 09/07/1955,  MRN: [id number]  A 65 y.o. male presents concern for left second digit dislocation with underlying possible osteomyelitis.  I reevaluated the wound today it seems to be that he may have regressed a little bit as well as concern for not being able to relocate the toe.  I believe the original injury may have dislocated the toe which led to ulceration which has underlying osteomyelitis leading to further worsening of the bone.  The left foot x-ray was evaluated by me which may have been more concerning for bone infection.  I discussed all of this with the patient extensive detail.  He is doing well overall at bedside.  No systemic signs of infection. Objective:   Vitals:   06/28/20 0759 06/28/20 1120  BP:    Pulse:  70  Resp:  17  Temp:    SpO2: 98% 93%   General AA&O x3. Normal mood and affect.  Vascular Dorsalis pedis and posterior tibial pulses 2/4 bilat. Brisk capillary refill to all digits. Pedal hair present.  Neurologic Epicritic sensation grossly intact.  Dermatologic  open wound noted to the medial side of the PIPJ joint.  Does not appear that the bone is exposed.  However upon reevaluation it seems that it is to the deep tissue that could be concerning for osteomyelitis.  There is a lateral deviation of the middle phalanx on the proximal phalanx.  Cap refill is intact.  No ecchymosis present.  No clinical signs of infection present.  Orthopedic: MMT 5/5 in dorsiflexion, plantarflexion, inversion, and eversion. Normal joint ROM without pain or crepitus.    Assessment & Plan:  Patient was evaluated and treated and all questions answered.  Left second digit PIPJ joint dislocation with underlying medial ulceration with now concern for osteomyelitis -I explained to the patient the etiology of possible dislocation which may have lead to open ulceration and therefore osteomyelitis.  The new foot x-rays that were taken  to me that were supposed to be post reduction showed a better imaging of possible concern for osteomyelitis.  Clinically given how the toe itself is looking with concern for osteomyelitis I believe patient will benefit from left second digit amputation.  I discussed this with the patient in extensive detail he states understanding and reported back to me that he would like to proceed with the amputation.  I plan on performing left second digit amputation to the level of the metatarsophalangeal joint with primary closure.  No purulent drainage was expressed however the wound does probe deep almost down to bone. -He can be weightbearing as tolerated at surgical shoe which is already at bedside after the surgery. -He will not need any dressing changes until he sees me in clinic 1 week after discharge. -N.p.o. after midnight I will plan on performing this surgery tomorrow sometimes around lunchtime. [name], DPM  Accessible via secure chat for questions or concerns.

## 2020-06-28 NOTE — Progress Notes (Addendum)
PROGRESS NOTE    Ronald Ruiz  [id number] DOB: 1955-11-19 DOA: 06/23/2020 PCP: [name], MD    Chief Complaint  Patient presents with  . 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 Patient noted to be hypotensive with systolic blood pressures in the 80s, tachycardic with a heart rate of 105, sats of 83% on 10 L O2 via trach collar. Patient given some saline nebs with some improvement. Patient also noted to have a leukocytosis. Chest x-ray done concerning for multifocal pneumonia. Patient received a dose of IV Solu-Medrol, placed empirically on IV antibiotics and hospitalist called for admission.   Assessment & Plan:   Principal Problem:   Severe sepsis ([place]) Active Problems:   Acute respiratory failure with hypoxia (HCC)   Community acquired pneumonia   Malignant neoplasm of glottis (HCC)   Protein-calorie malnutrition, severe   Dysphagia   COPD (chronic obstructive pulmonary disease) (HCC)   Normocytic anemia   Chronic pulmonary aspiration   Diarrhea   Abnormally small toe   Hypernatremia   Closed nondisplaced fracture of lesser toe of left foot with malunion  1 severe sepsis and acute respiratory failure with hypoxia secondary to multifocal pneumonia Patient noted to be hypoxic on admission with sats of 83% on 10 L O2 via trach collar on arrival. Patient also with a leukocytosis, elevated lactic acid level, tachycardic with heart rate up to 105, chest x-ray with multifocal pneumonia. SARS coronavirus PCR negative. Patient pancultured results pending. Urine strep pneumococcus antigen negative, urine Legionella antigen pending. Sputum Gram stain and  cultures with abundant procidentia rettgeri/moderate Klebsiella pneumonia which is resistant to ampicillin and cefazolin but sensitive to cefepime, ceftazidime, Rocephin, ciprofloxacin, gentamicin, Bactrim. Currently on 8 L O2 with a FiO2 of 35% via trach collar. Patient seems comfortable and slowly improving clinically. Patient with no use of accessory muscles of respiration.  Continue current trach care/trach suctioning, Pulmicort nebs, duo nebs.  IV Rocephin and oral doxycycline have been discontinued and patient started on Levaquin to complete a 10-day course of antibiotic treatment.  Discussed with ID, Dr. Johnnye Sima about antibiotic treatment.  2. COPD Stable. Patient with no active wheezing however patient presented with acute respiratory failure with hypoxia currently on 8 L trach collar with FiO2 of 35%.  Continue Pulmicort nebs, scheduled duo nebs.  Patient for amputation of toe tomorrow per podiatry, will have pulmonary assess patient to optimize pulmonary status preoperatively.  Follow.   3. Borderline blood pressures/hypotension Patient with systolic blood pressures in the 100s. Patient denies any dizziness. Patient noted to have baseline systolic blood pressures in the low 100s from prior office visits as noted on epic. Patient pancultured. Patient s/p IV albumin every 6 hours x24 hours. Improvement with blood pressure.  Saline lock IV fluids.  Follow.   4. History of throat cancer status post resection and tracheostomy, since decannulated/oropharyngeal dysphagia with chronic pulmonary aspiration now PEG dependent Keep n.p.o. Continue stoma and PEG tube care. Dietitian consulted.  5. Severe protein calorie malnutrition Secondary to chronic illness. Dietitian consulted and following.  Continue tube feeds.    6. Anemia of chronic disease/iron deficiency anemia Patient with no overt bleeding, hemoglobin trickling down likely due to dilutional effect. Anemia panel consistent with anemia of  chronic  disease/iron deficiency. Iron level at 13. Hemoglobin currently at 7.3.  We will transfuse a unit of packed red blood cells as patient for surgery tomorrow.  May need IV iron towards the end of hospitalization. Will likely need oral iron supplementation on discharge.  7. Abnormality of left second toe/left second toe commuted fracture/concern for osteomyelitis Left second toe dark with some thickening of the skin and tender to palpation.  Plain films done consistent with a commuted fracture involving distal portion of the second proximal phalanx with severe dislocation of the second middle phalanx relative to the second proximal phalanx.  CRP elevated at 9.2.  Sed rate elevated at 70.  Patient was on IV antibiotics has been transitioned to oral Levaquin.  Podiatry consulted and they assessed the patient and performed closed reduction of the left second toe with splinting.  Surgical shoe ordered.  Podiatry reviewed films of the foot again and due to concerns for probable osteomyelitis I recommended toe amputation to be done tomorrow.  We will have pulmonary assess patient's respiratory status to optimize preoperatively.  Weightbearing as tolerated.  Per podiatry.   Currently on Levaquin secondary to problem #1.  May need to be on a longer course postop.  8. Diarrhea Patient with multiple loose stools per patient and per RN over the past 1 to 2 days.  WBC normalized.  Patient on antibiotics.  C. difficile PCR negative.  Currently on Imodium as needed.  Improved.  Discontinue rectal tube.    9.  Hypernatremia Likely secondary to dehydration from numerous voluminous diarrhea.  Improved on D5W.  Was on normal saline which will discontinue.  10.  Hypokalemia Secondary to GI losses.  Repleted.  Potassium of 4.0.    DVT prophylaxis: Lovenox Code Status: Full Family Communication: Updated patient. Updated daughter, RN, at Nursing station.   Disposition:   Status is: Inpatient    Dispo: The  patient is from:               Anticipated d/c is to: Home              Anticipated d/c date is: 2 to 3 days.               Patient currently with multifocal pneumonia, with stoma, cold/borderline blood pressure, on antibiotics.  Concern for possible osteomyelitis and patient for amputation tomorrow per podiatry.  Not stable for discharge.     Difficult to place patient undetermined       Consultants:   Podiatry: Dr. Posey Pronto 06/26/2020  Close reduction of left second toe with splinting per Dr. Posey Pronto 06/26/2020  Procedures:   Chest x-ray 06/23/2020  Plain films of left second toe 06/25/2020  Antimicrobials:   Oral doxycycline to 06/23/2020>>>> 06/27/2020  IV cefepime 06/23/2018 22x1 dose  IV Rocephin to 06/23/2020>>>> 06/27/2020  IV vancomycin x1 dose  Omnicef 06/27/2020 x1 dose>>>> 06/27/2020  Oral Levaquin 06/27/2020   Subjective: Sitting up in bed.  No complaints.  States shortness of breath is improving.  Feels secretions from stoma improved.  No further diarrhea or loose stools.  No chest pain.  Patient complaining of left toe/foot pain.  Objective: Vitals:   06/27/20 2327 06/28/20 0356 06/28/20 0559 06/28/20 0759  BP:   109/62   Pulse: 88 63 62   Resp: 18 16 17    Temp:   98.4 F (36.9 C)   TempSrc:   Oral   SpO2: 94% 98% 94% 98%  Weight:      Height:  Intake/Output Summary (Last 24 hours) at 06/28/2020 1039 Last data filed at 06/28/2020 1012 Gross per 24 hour  Intake 1194.96 ml  Output 3400 ml  Net -2205.04 ml   Filed Weights   06/23/20 1826 06/25/20 0616 06/26/20 0528  Weight: 49.9 kg 54 kg 52.3 kg    Examination:  General exam: Frail.  Cachectic.  Chronically ill-appearing.  Trach collar on.  Stoma.  Respiratory system: Some coarse breath sounds anterior lung fields.  No wheezing.  Fair air movement.  No use of accessory muscles of respiration.  Cardiovascular system: RRR no murmurs rubs or gallops.  No JVD.  No lower extremity edema.  Gastrointestinal system:  Abdomen is soft, nontender, nondistended, positive bowel sounds.  No rebound.  No guarding.  PEG tube in place.  Central nervous system: Alert and oriented. No focal neurological deficits. Extremities: Left second toe dark/?  Bruising, skin thickening on the medial side of the toe, tender to palpation.  Skin: No rashes, lesions or ulcers Psychiatry: Judgement and insight appear normal. Mood & affect appropriate.     Data Reviewed: I have personally reviewed following labs and imaging studies  CBC: Recent Labs  Lab 06/23/20 1510 06/24/20 0331 06/25/20 0316 06/25/20 1652 06/26/20 0322 06/27/20 0318 06/28/20 0325  WBC 12.5* 9.5 8.4  --  8.2 7.1 6.1  NEUTROABS 11.3*  --  7.2  --  7.1 5.6 4.6  HGB 9.6* 8.5* 7.5* 8.0* 8.3* 7.4* 7.3*  HCT 31.8* 27.9* 25.6* 26.9* 28.0* 25.1* 23.8*  MCV 89.1 88.3 89.8  --  89.5 89.6 87.2  PLT 350 289 278  --  285 232 [id number]    Basic Metabolic Panel: Recent Labs  Lab 06/24/20 0331 06/24/20 1550 06/25/20 0316 06/25/20 1652 06/26/20 0322 06/27/20 0318 06/28/20 0325  NA 142  --  144  --  148* 136 139  K 4.0  --  4.0  --  3.0* 4.1 4.0  CL 104  --  107  --  110 105 101  CO2 26  --  26  --  28 26 30   GLUCOSE 91  --  70  --  96 99 93  BUN 36*  --  38*  --  24* 20 25*  CREATININE 0.81  --  0.82  --  0.70 0.57* 0.55*  CALCIUM 8.4*  --  8.4*  --  8.3* 7.8* 8.0*  MG  --  2.0 2.1 2.0 2.1 1.9  --   PHOS  --  5.1* 3.1 1.9* 2.7  --   --     GFR: Estimated Creatinine Clearance: 69 mL/min (A) (by C-G formula based on SCr of 0.55 mg/dL (L)).  Liver Function Tests: Recent Labs  Lab 06/23/20 1510 06/25/20 0316  AST 22 23  ALT 14 15  ALKPHOS 92 65  BILITOT 0.4 0.3  PROT 6.9 6.1*  ALBUMIN 2.0* 2.5*    CBG: Recent Labs  Lab 06/27/20 1200 06/27/20 1727 06/27/20 2353 06/28/20 0556 06/28/20 0700  GLUCAP 89 133* 83 67* 90     Recent Results (from the past 240 hour(s))  SARS Coronavirus 2 by RT PCR (hospital order, performed in [facility]  hospital lab) Nasopharyngeal Nasopharyngeal Swab     Status: None   Collection Time: 06/23/20  3:10 PM   Specimen: Nasopharyngeal Swab  Result Value Ref Range Status   SARS Coronavirus 2 NEGATIVE NEGATIVE Final    Comment: (NOTE) SARS-CoV-2 target nucleic acids are NOT DETECTED.  The SARS-CoV-2 RNA is generally detectable in  upper and lower respiratory specimens during the acute phase of infection. The lowest concentration of SARS-CoV-2 viral copies this assay can detect is 250 copies / mL. A negative result does not preclude SARS-CoV-2 infection and should not be used as the sole basis for treatment or other patient management decisions.  A negative result may occur with improper specimen collection / handling, submission of specimen other than nasopharyngeal swab, presence of viral mutation(s) within the areas targeted by this assay, and inadequate number of viral copies (<250 copies / mL). A negative result must be combined with clinical observations, patient history, and epidemiological information.  Fact Sheet for Patients:   StrictlyIdeas.no  Fact Sheet for Healthcare Providers: BankingDealers.co.za  This test is not yet approved or  cleared by the [place] FDA and has been authorized for detection and/or diagnosis of SARS-CoV-2 by FDA under an Emergency Use Authorization (EUA).  This EUA will remain in effect (meaning this test can be used) for the duration of the COVID-19 declaration under Section 564(b)(1) of the Act, 21 U.S.C. section 360bbb-3(b)(1), unless the authorization is terminated or revoked sooner.  Performed at [facility], [place] [address], [place], [place] [address]   Culture, blood (routine x 2)     Status: None (Preliminary result)   Collection Time: 06/23/20  6:13 PM   Specimen: BLOOD  Result Value Ref Range Status   Specimen Description   Final    BLOOD LEFT ANTECUBITAL Performed  at [place] [place] [address] [place], [place] [address]    Special Requests   Final    BOTTLES DRAWN AEROBIC AND ANAEROBIC Blood Culture results may not be optimal due to an inadequate volume of blood received in culture bottles Performed at [place] [address], [place], [place] [address]    Culture   Final    NO GROWTH 4 DAYS Performed at [facility], [place] [address], [place], [place] [address]    Report Status PENDING  Incomplete  Culture, blood (routine x 2)     Status: None (Preliminary result)   Collection Time: 06/23/20  6:38 PM   Specimen: BLOOD  Result Value Ref Range Status   Specimen Description   Final    BLOOD RIGHT ANTECUBITAL Performed at [place] [address] [place], [place] [address]    Special Requests   Final    BOTTLES DRAWN AEROBIC AND ANAEROBIC Blood Culture results may not be optimal due to an inadequate volume of blood received in culture bottles Performed at [place] [address] [place], [place] [address]    Culture   Final    NO GROWTH 4 DAYS Performed at [facility], [place] [address] [place], [place] [address]    Report Status PENDING  Incomplete  Culture, sputum-assessment     Status: None   Collection Time: 06/23/20  7:24 PM   Specimen: Expectorated Sputum  Result Value Ref Range Status   Specimen Description EXPECTORATED SPUTUM  Final   Special Requests NONE  Final   Sputum evaluation   Final    THIS SPECIMEN IS ACCEPTABLE FOR SPUTUM CULTURE Performed at [facility], [place] [address], [place], [place] [address]    Report Status 06/23/2020 FINAL  Final  Culture, respiratory     Status: None   Collection Time: 06/23/20  7:24 PM  Result Value Ref Range Status   Specimen Description   Final  EXPECTORATED SPUTUM Performed at [facility], [place] [address] [place], [place] [address]    Special  Requests   Final    NONE Reflexed from [id number] Performed at [place] [address] [place], [place] [address]    Gram Stain   Final    ABUNDANT WBC PRESENT, PREDOMINANTLY PMN ABUNDANT GRAM NEGATIVE RODS MODERATE GRAM POSITIVE RODS FEW GRAM POSITIVE COCCI    Culture   Final    ABUNDANT PROVIDENCIA RETTGERI MODERATE KLEBSIELLA PNEUMONIAE WITHIN NORMAL RESPIRATORY FLORA Performed at [facility], [place] [address], [place], [place] [address]    Report Status 06/27/2020 FINAL  Final   Organism ID, Bacteria PROVIDENCIA RETTGERI  Final   Organism ID, Bacteria KLEBSIELLA PNEUMONIAE  Final      Susceptibility   Klebsiella pneumoniae - MIC*    AMPICILLIN >=32 RESISTANT Resistant     CEFAZOLIN <=4 SENSITIVE Sensitive     CEFEPIME <=0.12 SENSITIVE Sensitive     CEFTAZIDIME <=1 SENSITIVE Sensitive     CEFTRIAXONE <=0.25 SENSITIVE Sensitive     CIPROFLOXACIN <=0.25 SENSITIVE Sensitive     GENTAMICIN <=1 SENSITIVE Sensitive     IMIPENEM <=0.25 SENSITIVE Sensitive     TRIMETH/SULFA <=20 SENSITIVE Sensitive     AMPICILLIN/SULBACTAM 4 SENSITIVE Sensitive     PIP/TAZO <=4 SENSITIVE Sensitive     * MODERATE KLEBSIELLA PNEUMONIAE   Providencia rettgeri - MIC*    AMPICILLIN >=32 RESISTANT Resistant     CEFAZOLIN >=64 RESISTANT Resistant     CEFEPIME 1 SENSITIVE Sensitive     CEFTAZIDIME <=1 SENSITIVE Sensitive     CEFTRIAXONE <=0.25 SENSITIVE Sensitive     CIPROFLOXACIN <=0.25 SENSITIVE Sensitive     GENTAMICIN <=1 SENSITIVE Sensitive     IMIPENEM 1 SENSITIVE Sensitive     TRIMETH/SULFA <=20 SENSITIVE Sensitive     AMPICILLIN/SULBACTAM 16 INTERMEDIATE Intermediate     PIP/TAZO >=128 RESISTANT Resistant     * ABUNDANT PROVIDENCIA RETTGERI  C Difficile Quick Screen w PCR reflex     Status: None   Collection Time: 06/25/20 11:35 AM   Specimen: STOOL  Result Value Ref Range Status   C Diff antigen NEGATIVE NEGATIVE Final   C Diff toxin NEGATIVE NEGATIVE Final    C Diff interpretation No C. difficile detected.  Final    Comment: Performed at [facility], [place] [address] [place], [place] [address]         Radiology Studies: DG Foot Complete Left  Result Date: 06/26/2020 CLINICAL DATA:  65 year old male status post reduction of second toe fracture. EXAM: LEFT FOOT - COMPLETE 3+ VIEW COMPARISON:  Earlier radiograph dated 06/25/2020. FINDINGS: Persistent displaced fracture of the proximal phalanx of the second toe with probable impaction. Apparent cortical irregularity of the base of the proximal phalanx of the third toe, age indeterminate. There is no dislocation. Mild osteopenia. There is cortical irregularity of the head of the first metatarsal and mild hallux valgus. The soft tissues are unremarkable. IMPRESSION: 1. Persistent displaced fracture of the proximal phalanx of the second toe. 2. Cortical irregularity of the base of the proximal phalanx of the third toe, age indeterminate. Electronically Signed   By: Anner Crete M.D.   On: 06/26/2020 19:13        Scheduled Meds: . budesonide  0.5 mg Nebulization BID  . enoxaparin (LOVENOX) injection  40 mg Subcutaneous QHS  . feeding supplement (K[employer] STANDARD 1.4)  325 mL Per Tube 5 X  Daily  . feeding supplement (PROSource TF)  45 mL Per Tube TID  . free water  240 mL Per Tube 5 X Daily  . guaiFENesin  15 mL Per Tube Q6H  . ipratropium-albuterol  3 mL Nebulization BID  . levofloxacin  750 mg Per Tube Daily  . loratadine  10 mg Per Tube Daily  . methadone  120 mg Per Tube Daily  . pantoprazole sodium  40 mg Per Tube Daily   Continuous Infusions: . sodium chloride 75 mL/hr at 06/28/20 0927     LOS: 4 days    Time spent: 40 minutes    Irine Seal, MD [facility]   To contact the attending provider between 7A-7P or the covering provider during after hours 7P-7A, please log into the web site [email or web address] and access using universal [address]  password for that web site. If you do not have the password, please call the hospital operator.  06/28/2020, 10:39 AM

## 2020-06-28 NOTE — Progress Notes (Signed)
AM/cbg/67, pt with no symptoms, stated he was hungry, gave 0800 tube feeding. Will recheck CBG

## 2020-06-29 ENCOUNTER — Inpatient Hospital Stay (HOSPITAL_COMMUNITY): Payer: 59 | Admitting: Anesthesiology

## 2020-06-29 ENCOUNTER — Encounter (HOSPITAL_COMMUNITY): Payer: Self-pay | Admitting: Internal Medicine

## 2020-06-29 ENCOUNTER — Inpatient Hospital Stay (HOSPITAL_COMMUNITY): Payer: 59

## 2020-06-29 ENCOUNTER — Encounter (HOSPITAL_COMMUNITY)
Admission: EM | Disposition: A | Discharge status: Home Health | Payer: Self-pay | Source: Home / Self Care | Attending: Internal Medicine

## 2020-06-29 DIAGNOSIS — R6520 Severe sepsis without septic shock: Secondary | ICD-10-CM | POA: Diagnosis not present

## 2020-06-29 DIAGNOSIS — Z9889 Other specified postprocedural states: Secondary | ICD-10-CM | POA: Diagnosis not present

## 2020-06-29 DIAGNOSIS — I739 Peripheral vascular disease, unspecified: Secondary | ICD-10-CM

## 2020-06-29 DIAGNOSIS — J9601 Acute respiratory failure with hypoxia: Secondary | ICD-10-CM

## 2020-06-29 DIAGNOSIS — A419 Sepsis, unspecified organism: Secondary | ICD-10-CM | POA: Diagnosis not present

## 2020-06-29 DIAGNOSIS — J449 Chronic obstructive pulmonary disease, unspecified: Secondary | ICD-10-CM | POA: Diagnosis not present

## 2020-06-29 DIAGNOSIS — M86672 Other chronic osteomyelitis, left ankle and foot: Secondary | ICD-10-CM | POA: Diagnosis not present

## 2020-06-29 DIAGNOSIS — J189 Pneumonia, unspecified organism: Secondary | ICD-10-CM | POA: Diagnosis not present

## 2020-06-29 HISTORY — PX: AMPUTATION TOE: SHX6595

## 2020-06-29 LAB — BASIC METABOLIC PANEL
Anion gap: 8 (ref 5–15)
BUN: 25 mg/dL — ABNORMAL HIGH (ref 8–23)
CO2: 34 mmol/L — ABNORMAL HIGH (ref 22–32)
Calcium: 8.4 mg/dL — ABNORMAL LOW (ref 8.9–10.3)
Chloride: 98 mmol/L (ref 98–111)
Creatinine, Ser: 0.65 mg/dL (ref 0.61–1.24)
GFR, Estimated: >60 mL/min (ref >60)
Glucose, Bld: 78 mg/dL (ref 70–99)
Potassium: 4.3 mmol/L (ref 3.5–5.1)
Sodium: 140 mmol/L (ref 135–145)

## 2020-06-29 LAB — TYPE AND SCREEN
ABO/RH(D): O POS
Antibody Screen: NEGATIVE
Unit division: 0

## 2020-06-29 LAB — LEGIONELLA PNEUMOPHILA SEROGP 1 UR AG: L. pneumophila Serogp 1 Ur Ag: NEGATIVE

## 2020-06-29 LAB — BPAM RBC
Blood Product Expiration Date: 202203072359
ISSUE DATE / TIME: 202202061905
Unit Type and Rh: 5100

## 2020-06-29 LAB — CBC WITH DIFFERENTIAL/PLATELET
Abs Immature Granulocytes: 0.03 10*3/uL (ref 0.00–0.07)
Basophils Absolute: 0 10*3/uL (ref 0.0–0.1)
Basophils Relative: 0 %
Eosinophils Absolute: 0.1 10*3/uL (ref 0.0–0.5)
Eosinophils Relative: 2 %
HCT: 30.8 % — ABNORMAL LOW (ref 39.0–52.0)
Hemoglobin: 9.2 g/dL — ABNORMAL LOW (ref 13.0–17.0)
Immature Granulocytes: 0 %
Lymphocytes Relative: 8 %
Lymphs Abs: 0.6 10*3/uL — ABNORMAL LOW (ref 0.7–4.0)
MCH: 26.6 pg (ref 26.0–34.0)
MCHC: 29.9 g/dL — ABNORMAL LOW (ref 30.0–36.0)
MCV: 89 fL (ref 80.0–100.0)
Monocytes Absolute: 0.7 10*3/uL (ref 0.1–1.0)
Monocytes Relative: 10 %
Neutro Abs: 5.8 10*3/uL (ref 1.7–7.7)
Neutrophils Relative %: 80 %
Platelets: 232 10*3/uL (ref 150–400)
RBC: 3.46 MIL/uL — ABNORMAL LOW (ref 4.22–5.81)
RDW: 15.6 % — ABNORMAL HIGH (ref 11.5–15.5)
WBC: 7.2 10*3/uL (ref 4.0–10.5)
nRBC: 0 % (ref 0.0–0.2)

## 2020-06-29 LAB — MAGNESIUM: Magnesium: 2 mg/dL (ref 1.7–2.4)

## 2020-06-29 LAB — GLUCOSE, CAPILLARY
Glucose-Capillary: 66 mg/dL — ABNORMAL LOW (ref 70–99)
Glucose-Capillary: 86 mg/dL (ref 70–99)
Glucose-Capillary: 102 mg/dL — ABNORMAL HIGH (ref 70–99)

## 2020-06-29 SURGERY — AMPUTATION, TOE
Anesthesia: Regional | Site: Toe | Laterality: Left

## 2020-06-29 MED ORDER — MIDAZOLAM HCL 5 MG/5ML IJ SOLN
INTRAMUSCULAR | Status: DC | PRN
  Administered 2020-06-29: 2 mg via INTRAVENOUS

## 2020-06-29 MED ORDER — LIDOCAINE HCL 2 % IJ SOLN
INTRAMUSCULAR | Status: DC | PRN
  Administered 2020-06-29: 7 mL

## 2020-06-29 MED ORDER — CHLORHEXIDINE GLUCONATE 0.12 % MT SOLN
15.0000 mL | Freq: Once | OROMUCOSAL | Status: AC
  Administered 2020-06-29: 15 mL via OROMUCOSAL

## 2020-06-29 MED ORDER — LIDOCAINE HCL 2 % IJ SOLN
INTRAMUSCULAR | Status: AC
  Filled 2020-06-29: qty 20

## 2020-06-29 MED ORDER — FENTANYL CITRATE (PF) 100 MCG/2ML IJ SOLN
INTRAMUSCULAR | Status: AC
  Filled 2020-06-29: qty 2

## 2020-06-29 MED ORDER — PROPOFOL 10 MG/ML IV BOLUS
INTRAVENOUS | Status: AC
  Filled 2020-06-29: qty 40

## 2020-06-29 MED ORDER — FENTANYL CITRATE (PF) 100 MCG/2ML IJ SOLN: 25.0000 ug | INTRAMUSCULAR | Status: DC | PRN

## 2020-06-29 MED ORDER — 0.9 % SODIUM CHLORIDE (POUR BTL) OPTIME
TOPICAL | Status: DC | PRN
  Administered 2020-06-29: 1000 mL

## 2020-06-29 MED ORDER — BUPIVACAINE HCL (PF) 0.5 % IJ SOLN
INTRAMUSCULAR | Status: DC | PRN
  Administered 2020-06-29: 7 mL

## 2020-06-29 MED ORDER — MIDAZOLAM HCL 2 MG/2ML IJ SOLN
INTRAMUSCULAR | Status: AC
  Filled 2020-06-29: qty 2

## 2020-06-29 MED ORDER — BUPIVACAINE HCL (PF) 0.5 % IJ SOLN
INTRAMUSCULAR | Status: AC
  Filled 2020-06-29: qty 30

## 2020-06-29 MED ORDER — PROPOFOL 500 MG/50ML IV EMUL
INTRAVENOUS | Status: DC | PRN
  Administered 2020-06-29: 50 ug/kg/min via INTRAVENOUS

## 2020-06-29 MED ORDER — LACTATED RINGERS IV SOLN: INTRAVENOUS | Status: DC

## 2020-06-29 MED ORDER — FENTANYL CITRATE (PF) 100 MCG/2ML IJ SOLN
INTRAMUSCULAR | Status: DC | PRN
  Administered 2020-06-29: 50 ug via INTRAVENOUS

## 2020-06-29 MED ORDER — PROPOFOL 1000 MG/100ML IV EMUL
INTRAVENOUS | Status: AC
  Filled 2020-06-29: qty 200

## 2020-06-29 SURGICAL SUPPLY — 44 items
APL PRP STRL LF DISP 70% ISPRP (MISCELLANEOUS) ×1
BNDG CMPR 9X4 STRL LF SNTH (GAUZE/BANDAGES/DRESSINGS)
BNDG ELASTIC 3X5.8 VLCR STR LF (GAUZE/BANDAGES/DRESSINGS) IMPLANT
BNDG ELASTIC 4X5.8 VLCR STR LF (GAUZE/BANDAGES/DRESSINGS) ×2 IMPLANT
BNDG ESMARK 4X9 LF (GAUZE/BANDAGES/DRESSINGS) IMPLANT
BNDG GAUZE ELAST 4 BULKY (GAUZE/BANDAGES/DRESSINGS) ×3 IMPLANT
CHLORAPREP W/TINT 26 (MISCELLANEOUS) ×2 IMPLANT
COVER MAYO STAND STRL (DRAPES) ×2 IMPLANT
COVER WAND RF STERILE (DRAPES) IMPLANT
CUFF TOURN SGL QUICK 34 (TOURNIQUET CUFF)
CUFF TRNQT CYL 34X4.125X (TOURNIQUET CUFF) IMPLANT
DRAPE IMP U-DRAPE 54X76 (DRAPES) ×2 IMPLANT
DRAPE SURG 17X23 STRL (DRAPES) IMPLANT
DRAPE U-SHAPE 47X51 STRL (DRAPES) ×2 IMPLANT
DRSG EMULSION OIL 3X3 NADH (GAUZE/BANDAGES/DRESSINGS) ×2 IMPLANT
GAUZE SPONGE 4X4 12PLY STRL (GAUZE/BANDAGES/DRESSINGS) ×2 IMPLANT
GLOVE SURG ENC MOIS LTX SZ7 (GLOVE) ×2 IMPLANT
GLOVE SURG UNDER POLY LF SZ7.5 (GLOVE) ×2 IMPLANT
GOWN STRL REUS W/ TWL LRG LVL3 (GOWN DISPOSABLE) ×1 IMPLANT
GOWN STRL REUS W/ TWL XL LVL3 (GOWN DISPOSABLE) IMPLANT
GOWN STRL REUS W/TWL LRG LVL3 (GOWN DISPOSABLE) ×2
GOWN STRL REUS W/TWL XL LVL3 (GOWN DISPOSABLE)
KIT BASIN OR (CUSTOM PROCEDURE TRAY) ×2 IMPLANT
NDL BIOPSY JAMSHIDI 11X6 (NEEDLE) IMPLANT
NDL HYPO 25X1 1.5 SAFETY (NEEDLE) ×1 IMPLANT
NDL SAFETY ECLIPSE 18X1.5 (NEEDLE) IMPLANT
NEEDLE BIOPSY JAMSHIDI 11X6 (NEEDLE) IMPLANT
NEEDLE HYPO 18GX1.5 SHARP (NEEDLE)
NEEDLE HYPO 25X1 1.5 SAFETY (NEEDLE) ×2 IMPLANT
NS IRRIG 1000ML POUR BTL (IV SOLUTION) IMPLANT
PACK ORTHO EXTREMITY (CUSTOM PROCEDURE TRAY) ×2 IMPLANT
PADDING CAST ABS 4INX4YD NS (CAST SUPPLIES) ×2
PADDING CAST ABS COTTON 4X4 ST (CAST SUPPLIES) ×2 IMPLANT
PENCIL SMOKE EVACUATOR (MISCELLANEOUS) ×2 IMPLANT
STAPLER VISISTAT 35W (STAPLE) IMPLANT
STOCKINETTE 6  STRL (DRAPES)
STOCKINETTE 6 STRL (DRAPES) IMPLANT
SUT MNCRL AB 3-0 PS2 18 (SUTURE) IMPLANT
SUT MNCRL AB 4-0 PS2 18 (SUTURE) IMPLANT
SUT MON AB 5-0 PS2 18 (SUTURE) IMPLANT
SUT PROLENE 4 0 PS 2 18 (SUTURE) IMPLANT
SUT VIC AB 4-0 P2 18 (SUTURE) IMPLANT
SYR CONTROL 10ML LL (SYRINGE) IMPLANT
UNDERPAD 30X36 HEAVY ABSORB (UNDERPADS AND DIAPERS) ×2 IMPLANT

## 2020-06-29 NOTE — Progress Notes (Signed)
PROGRESS NOTE    Ronald Ruiz  [id number] DOB: 1955/06/14 DOA: 06/23/2020 PCP: [name], MD    Chief Complaint  Patient presents with  . 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 Patient noted to be hypotensive with systolic blood pressures in the 80s, tachycardic with a heart rate of 105, sats of 83% on 10 L O2 via trach collar. Patient given some saline nebs with some improvement. Patient also noted to have a leukocytosis. Chest x-ray done concerning for multifocal pneumonia. Patient received a dose of IV Solu-Medrol, placed empirically on IV antibiotics and hospitalist called for admission.   Assessment & Plan:   Principal Problem:   Severe sepsis ([place]) Active Problems:   Acute respiratory failure with hypoxia (HCC)   Community acquired pneumonia   Malignant neoplasm of glottis (HCC)   Protein-calorie malnutrition, severe   Dysphagia   COPD (chronic obstructive pulmonary disease) (HCC)   Normocytic anemia   Chronic pulmonary aspiration   Diarrhea   Abnormally small toe   Hypernatremia   Closed nondisplaced fracture of lesser toe of left foot with malunion  1 severe sepsis and acute respiratory failure with hypoxia secondary to multifocal pneumonia Patient noted to be hypoxic on admission with sats of 83% on 10 L O2 via trach collar on arrival. Patient also with a leukocytosis, elevated lactic acid level, tachycardic with heart rate up to 105, chest x-ray with multifocal pneumonia. SARS coronavirus PCR negative. Patient pancultured results pending. Urine strep pneumococcus antigen negative, urine Legionella antigen pending. Sputum Gram stain and  cultures with abundant procidentia rettgeri/moderate Klebsiella pneumonia which is resistant to ampicillin and cefazolin but sensitive to cefepime, ceftazidime, Rocephin, ciprofloxacin, gentamicin, Bactrim. Currently on 7 L O2 with a FiO2 of 35% via trach collar. Patient seems comfortable and slowly improving clinically. Patient with no use of accessory muscles of respiration.  Continue current trach care/trach suctioning, Pulmicort nebs, yuprlri, Brovana as recommended per pulmonary.  IV Rocephin and oral doxycycline have been discontinued and patient started on Levaquin to complete a 10-day course of antibiotic treatment.  Discussed with ID, Dr. Johnnye Sima about antibiotic treatment.  Outpatient follow-up with pulmonary.  2. COPD Stable. Patient with no active wheezing however patient presented with acute respiratory failure with hypoxia currently on 7 L trach collar with FiO2 of 35%.  Continue Pulmicort nebs, Brovana, YUPRLRI as recommended by pulmonary.  Pulmonary hygiene.  Wean O2.  Outpatient follow-up with pulmonary.  3. Borderline blood pressures/hypotension Patient with systolic blood pressures in the mid 90s. Patient denies any dizziness. Patient noted to have baseline systolic blood pressures in the low 100s from prior office visits as noted on epic. Patient pancultured. Patient s/p IV albumin every 6 hours x24 hours.  Status post transfusion 1 unit packed red blood cells.  Follow.    4. History of throat cancer status post resection and tracheostomy, since decannulated/oropharyngeal dysphagia with chronic pulmonary aspiration now PEG dependent Keep n.p.o. Continue stoma and PEG tube care. Dietitian consulted.  5. Severe protein calorie malnutrition Secondary to chronic illness. Dietitian consulted and following.  Continue tube feeds.    6. Anemia of chronic disease/iron deficiency anemia Patient with no overt bleeding, hemoglobin trickling down likely due to dilutional effect. Anemia panel  consistent  with anemia of chronic disease/iron deficiency. Iron level at 13. Hemoglobin currently at 7.3.  Status post transfusion 1 unit packed red blood cells.  Will order IV Feraheme tomorrow.  Hemoglobin posttransfusion at 9.2.  Will need oral iron supplementation on discharge.  Follow.    7. Abnormality of left second toe/left second toe commuted fracture/osteomyelitis of second left toe Left second toe dark with some thickening of the skin and tender to palpation.  Plain films done consistent with a commuted fracture involving distal portion of the second proximal phalanx with severe dislocation of the second middle phalanx relative to the second proximal phalanx.  CRP elevated at 9.2.  Sed rate elevated at 70.  Patient was on IV antibiotics has been transitioned to oral Levaquin.  Podiatry consulted and they assessed the patient and performed closed reduction of the left second toe with splinting.  Surgical shoe ordered.  Podiatry reviewed films of the foot again and due to concerns for probable osteomyelitis and patient subsequently underwent toe amputation today 06/29/2020 without any complications.  Per podiatry clean margins with no further osteomyelitis noted and amputation has treated osteomyelitis no extended antibiotics needed for osteomyelitis.  Outpatient follow-up with podiatry.  8. Diarrhea Patient with multiple loose stools per patient and per RN.  C. difficile PCR negative.  WBC normalized.  No further loose stools.  Rectal tube ordered to be discontinued.  Imodium as needed.    9.  Hypernatremia Likely secondary to dehydration from numerous voluminous diarrhea.  Improved on D5W.  IV fluids discontinued.   10.  Hypokalemia Secondary to GI losses.  Repleted.  Potassium of 4.3.     DVT prophylaxis: Lovenox Code Status: Full Family Communication: Updated patient.  No family at bedside.  Disposition:   Status is: Inpatient    Dispo: The patient is from:                Anticipated d/c is to: Home              Anticipated d/c date is: 1-2 days.               Patient currently with multifocal pneumonia, with stoma, cold/borderline blood pressure, on antibiotics.  Concern for possible osteomyelitis and status post amputation 06/29/2020.  Not stable for discharge.     Difficult to place patient undetermined       Consultants:   Podiatry: Dr. Posey Pronto 06/26/2020  Close reduction of left second toe with splinting per Dr. Posey Pronto 06/26/2020  Left second toe amputation per podiatry, Dr. Posey Pronto 06/29/2020  Procedures:   Chest x-ray 06/23/2020  Plain films of left second toe 06/25/2020  Transfusion 1 unit packed red blood cells 06/28/2020  Antimicrobials:   Oral doxycycline to 06/23/2020>>>> 06/27/2020  IV cefepime 06/23/2018 22x1 dose  IV Rocephin to 06/23/2020>>>> 06/27/2020  IV vancomycin x1 dose  Omnicef 06/27/2020 x1 dose>>>> 06/27/2020  Oral Levaquin 06/27/2020>>>>>   Subjective: Sitting up in bed just returned from PACU.  Denies any chest pain or shortness of breath.  No abdominal pain.  Overall feeling better.    Objective: Vitals:   06/29/20 0844 06/29/20 0900 06/29/20 0954 06/29/20 1100  BP: (!) 96/54 101/65 109/65 94/65  Pulse: (!) 51 (!) 56 60 62  Resp:  18 16 18   Temp:  97.8 F (36.6 C) 97.9 F (36.6 C) 97.9 F (36.6 C)  TempSrc:  Oral Oral Oral  SpO2: 100% 100% 100% 100%  Weight:      Height:  Intake/Output Summary (Last 24 hours) at 06/29/2020 1119 Last data filed at 06/29/2020 1102 Gross per 24 hour  Intake 464 ml  Output 5580 ml  Net -5116 ml   Filed Weights   06/23/20 1826 06/25/20 0616 06/26/20 0528  Weight: 49.9 kg 54 kg 52.3 kg    Examination:  General exam: Cachectic.  Frail.  Chronically ill-appearing.  Trach collar on.  Stoma.  Respiratory system: Scattered coarse breath sounds anterior lung fields.  No wheezing.  Fair air movement.  No use of accessory muscles of respiration.  Cardiovascular system: Regular rate rhythm no  murmurs rubs or gallops.  No JVD.  No lower extremity edema. Gastrointestinal system: Abdomen is soft, nontender, nondistended, positive bowel sounds.  No rebound.  No guarding.  PEG tube in place.  Central nervous system: Alert and oriented. No focal neurological deficits. Extremities: Left lower extremity in postop bandage.  Skin: No rashes, lesions or ulcers Psychiatry: Judgement and insight appear normal. Mood & affect appropriate.     Data Reviewed: I have personally reviewed following labs and imaging studies  CBC: Recent Labs  Lab 06/25/20 0316 06/25/20 1652 06/26/20 0322 06/27/20 0318 06/28/20 0325 06/29/20 0300  WBC 8.4  --  8.2 7.1 6.1 7.2  NEUTROABS 7.2  --  7.1 5.6 4.6 5.8  HGB 7.5* 8.0* 8.3* 7.4* 7.3* 9.2*  HCT 25.6* 26.9* 28.0* 25.1* 23.8* 30.8*  MCV 89.8  --  89.5 89.6 87.2 89.0  PLT 278  --  285 232 212 361    Basic Metabolic Panel: Recent Labs  Lab 06/24/20 1550 06/25/20 0316 06/25/20 1652 06/26/20 0322 06/27/20 0318 06/28/20 0325 06/29/20 0300  NA  --  144  --  148* 136 139 140  K  --  4.0  --  3.0* 4.1 4.0 4.3  CL  --  107  --  110 105 101 98  CO2  --  26  --  28 26 30  34*  GLUCOSE  --  70  --  96 99 93 78  BUN  --  38*  --  24* 20 25* 25*  CREATININE  --  0.82  --  0.70 0.57* 0.55* 0.65  CALCIUM  --  8.4*  --  8.3* 7.8* 8.0* 8.4*  MG 2.0 2.1 2.0 2.1 1.9  --  2.0  PHOS 5.1* 3.1 1.9* 2.7  --   --   --     GFR: Estimated Creatinine Clearance: 69 mL/min (by C-G formula based on SCr of 0.65 mg/dL).  Liver Function Tests: Recent Labs  Lab 06/23/20 1510 06/25/20 0316  AST 22 23  ALT 14 15  ALKPHOS 92 65  BILITOT 0.4 0.3  PROT 6.9 6.1*  ALBUMIN 2.0* 2.5*    CBG: Recent Labs  Lab 06/28/20 0700 06/28/20 1141 06/28/20 1750 06/28/20 2343 06/29/20 0550  GLUCAP 90 78 88 89 66*     Recent Results (from the past 240 hour(s))  SARS Coronavirus 2 by RT PCR (hospital order, performed in [facility] hospital lab) Nasopharyngeal  Nasopharyngeal Swab     Status: None   Collection Time: 06/23/20  3:10 PM   Specimen: Nasopharyngeal Swab  Result Value Ref Range Status   SARS Coronavirus 2 NEGATIVE NEGATIVE Final    Comment: (NOTE) SARS-CoV-2 target nucleic acids are NOT DETECTED.  The SARS-CoV-2 RNA is generally detectable in upper and lower respiratory specimens during the acute phase of infection. The lowest concentration of SARS-CoV-2 viral copies this assay can detect is 250 copies /  mL. A negative result does not preclude SARS-CoV-2 infection and should not be used as the sole basis for treatment or other patient management decisions.  A negative result may occur with improper specimen collection / handling, submission of specimen other than nasopharyngeal swab, presence of viral mutation(s) within the areas targeted by this assay, and inadequate number of viral copies (<250 copies / mL). A negative result must be combined with clinical observations, patient history, and epidemiological information.  Fact Sheet for Patients:   StrictlyIdeas.no  Fact Sheet for Healthcare Providers: BankingDealers.co.za  This test is not yet approved or  cleared by the [place] FDA and has been authorized for detection and/or diagnosis of SARS-CoV-2 by FDA under an Emergency Use Authorization (EUA).  This EUA will remain in effect (meaning this test can be used) for the duration of the COVID-19 declaration under Section 564(b)(1) of the Act, 21 U.S.C. section 360bbb-3(b)(1), unless the authorization is terminated or revoked sooner.  Performed at [facility], [place] [address] [place], [place] [address]   Culture, blood (routine x 2)     Status: None   Collection Time: 06/23/20  6:13 PM   Specimen: BLOOD  Result Value Ref Range Status   Specimen Description   Final    BLOOD LEFT ANTECUBITAL Performed at [facility]  [address] [place], [place] [address]    Special Requests   Final    BOTTLES DRAWN AEROBIC AND ANAEROBIC Blood Culture results may not be optimal due to an inadequate volume of blood received in culture bottles Performed at [facility] [address], [place], [place] [address]    Culture   Final    NO GROWTH 5 DAYS Performed at [facility], [place] [address] [place], [place] [address]    Report Status 06/28/2020 FINAL  Final  Culture, blood (routine x 2)     Status: None   Collection Time: 06/23/20  6:38 PM   Specimen: BLOOD  Result Value Ref Range Status   Specimen Description   Final    BLOOD RIGHT ANTECUBITAL Performed at [facility] [address] [place], [place] [address]    Special Requests   Final    BOTTLES DRAWN AEROBIC AND ANAEROBIC Blood Culture results may not be optimal due to an inadequate volume of blood received in culture bottles Performed at [facility] [address], [place], [place] [address]    Culture   Final    NO GROWTH 5 DAYS Performed at [facility], [place] [address] [place], [place] [address]    Report Status 06/28/2020 FINAL  Final  Culture, sputum-assessment     Status: None   Collection Time: 06/23/20  7:24 PM   Specimen: Expectorated Sputum  Result Value Ref Range Status   Specimen Description EXPECTORATED SPUTUM  Final   Special Requests NONE  Final   Sputum evaluation   Final    THIS SPECIMEN IS ACCEPTABLE FOR SPUTUM CULTURE Performed at [facility], [place] [address], [place], [place] [address]    Report Status 06/23/2020 FINAL  Final  Culture, respiratory     Status: None   Collection Time: 06/23/20  7:24 PM  Result Value Ref Range Status   Specimen Description   Final    EXPECTORATED SPUTUM Performed at [facility] [address] [place], [place] [address]    Special Requests   Final  NONE Reflexed from  [id number] Performed at [facility], [place] [address], [place], [place] [address]    Gram Stain   Final    ABUNDANT WBC PRESENT, PREDOMINANTLY PMN ABUNDANT GRAM NEGATIVE RODS MODERATE GRAM POSITIVE RODS FEW GRAM POSITIVE COCCI    Culture   Final    ABUNDANT PROVIDENCIA RETTGERI MODERATE KLEBSIELLA PNEUMONIAE WITHIN NORMAL RESPIRATORY FLORA Performed at [facility], [place] [address], [place], [place] [address]    Report Status 06/27/2020 FINAL  Final   Organism ID, Bacteria PROVIDENCIA RETTGERI  Final   Organism ID, Bacteria KLEBSIELLA PNEUMONIAE  Final      Susceptibility   Klebsiella pneumoniae - MIC*    AMPICILLIN >=32 RESISTANT Resistant     CEFAZOLIN <=4 SENSITIVE Sensitive     CEFEPIME <=0.12 SENSITIVE Sensitive     CEFTAZIDIME <=1 SENSITIVE Sensitive     CEFTRIAXONE <=0.25 SENSITIVE Sensitive     CIPROFLOXACIN <=0.25 SENSITIVE Sensitive     GENTAMICIN <=1 SENSITIVE Sensitive     IMIPENEM <=0.25 SENSITIVE Sensitive     TRIMETH/SULFA <=20 SENSITIVE Sensitive     AMPICILLIN/SULBACTAM 4 SENSITIVE Sensitive     PIP/TAZO <=4 SENSITIVE Sensitive     * MODERATE KLEBSIELLA PNEUMONIAE   Providencia rettgeri - MIC*    AMPICILLIN >=32 RESISTANT Resistant     CEFAZOLIN >=64 RESISTANT Resistant     CEFEPIME 1 SENSITIVE Sensitive     CEFTAZIDIME <=1 SENSITIVE Sensitive     CEFTRIAXONE <=0.25 SENSITIVE Sensitive     CIPROFLOXACIN <=0.25 SENSITIVE Sensitive     GENTAMICIN <=1 SENSITIVE Sensitive     IMIPENEM 1 SENSITIVE Sensitive     TRIMETH/SULFA <=20 SENSITIVE Sensitive     AMPICILLIN/SULBACTAM 16 INTERMEDIATE Intermediate     PIP/TAZO >=128 RESISTANT Resistant     * ABUNDANT PROVIDENCIA RETTGERI  C Difficile Quick Screen w PCR reflex     Status: None   Collection Time: 06/25/20 11:35 AM   Specimen: STOOL  Result Value Ref Range Status   C Diff antigen NEGATIVE NEGATIVE Final   C Diff toxin NEGATIVE NEGATIVE Final   C Diff interpretation No C. difficile  detected.  Final    Comment: Performed at [facility], [place] [address], [place], [place] [address]  Surgical pcr screen     Status: None   Collection Time: 06/28/20  6:30 PM   Specimen: Nasal Mucosa; Nasal Swab  Result Value Ref Range Status   MRSA, PCR NEGATIVE NEGATIVE Final   Staphylococcus aureus NEGATIVE NEGATIVE Final    Comment: (NOTE) The Xpert SA Assay (FDA approved for NASAL specimens in patients 64 years of age and older), is one component of a comprehensive surveillance program. It is not intended to diagnose infection nor to guide or monitor treatment. Performed at [facility],  [address], [place], [place] [address]          Radiology Studies: DG Foot Complete Left  Result Date: 06/29/2020 CLINICAL DATA:  Status post second toe amputation. EXAM: LEFT FOOT - COMPLETE 3+ VIEW COMPARISON:  June 26, 2020. FINDINGS: Status post amputation of the of phalanges of the second toe. Expected postsurgical findings are seen in the soft tissues. Hallux valgus deformity of the first metatarsophalangeal joint is noted with mild osteophyte formation. No other fracture or bony abnormality is noted. IMPRESSION: Status post amputation of phalanges of the second toe. No other significant abnormality is noted. Electronically Signed   By: Bobbe Medico.D.  On: 06/29/2020 08:49        Scheduled Meds: . sodium chloride   Intravenous Once  . arformoterol  15 mcg Nebulization BID  . budesonide  0.5 mg Nebulization BID  . feeding supplement (KATE FARMS STANDARD 1.4)  325 mL Per Tube 5 X Daily  . feeding supplement (PROSource TF)  45 mL Per Tube TID  . free water  240 mL Per Tube 5 X Daily  . guaiFENesin  15 mL Per Tube Q6H  . levofloxacin  750 mg Per Tube Daily  . loratadine  10 mg Per Tube Daily  . methadone  120 mg Per Tube Daily  . pantoprazole sodium  40 mg Per Tube Daily  . revefenacin  175 mcg Nebulization Daily   Continuous  Infusions: . dextrose 5 % and 0.9% NaCl 10 mL/hr at 06/29/20 0552     LOS: 5 days    Time spent: 40 minutes    Irine Seal, MD [facility]   To contact the attending provider between 7A-7P or the covering provider during after hours 7P-7A, please log into the web site [email or web address] and access using universal [place] password for that web site. If you do not have the password, please call the hospital operator.  06/29/2020, 11:19 AM

## 2020-06-29 NOTE — Interval H&P Note (Signed)
History and Physical Interval Note:  06/29/2020 7:08 AM  Ronald Ruiz  has presented today for surgery, with the diagnosis of left second toe.  The various methods of treatment have been discussed with the patient and family. After consideration of risks, benefits and other options for treatment, the patient has consented to  Procedure(s): AMPUTATION TOE left second toe (Left) as a surgical intervention.  The patient's history has been reviewed, patient examined, no change in status, stable for surgery.  I have reviewed the patient's chart and labs.  Questions were answered to the patient's satisfaction.     [name]

## 2020-06-29 NOTE — Transfer of Care (Signed)
Immediate Anesthesia Transfer of Care Note  Patient: Ronald Ruiz  Procedure(s) Performed: Procedure(s): AMPUTATION TOE left second toe (Left)  Patient Location: PACU  Anesthesia Type:MAC  Level of Consciousness:  sedated, patient cooperative and responds to stimulation  Airway & Oxygen Therapy:Patient Spontanous Breathing and Patient connected to face mask oxgen  Post-op Assessment:  Report given to PACU RN and Post -op Vital signs reviewed and stable  Post vital signs:  Reviewed and stable  Last Vitals:  Vitals:   06/29/20 0652 06/29/20 0757  BP: 117/70   Pulse: 64   Resp: 16   Temp: 36.7 C 36.5 C  SpO2: 338%     Complications: No apparent anesthesia complications

## 2020-06-29 NOTE — Progress Notes (Addendum)
NAME:  Ronald Ruiz, MRN:  [id number], DOB:  05-23-1956, LOS: 5 ADMISSION DATE:  06/23/2020, CONSULTATION DATE: 06/28/2020 REFERRING MD: Dr. Marcello Ruiz, CHIEF COMPLAINT: Respiratory perioperative valuation  Brief History:  65 year old gentleman, past medical history of COPD, history of laryngeal cancer, status post tracheostomy (since de-cannulated but has stoma) with chronic respiratory failure (not on O2), chronic aspiration, PEG dependent and substance use disorder on chronic methadone who presented to [facility] on 2/1 with reports of SOB, increased cough and sputum production.  He was found to have multifocal infiltrates on CXR and cultures that grew klebsiella and providencia PNA and treated accordingly with abx. He was noted to have a tender left second toe with skin thickening and elevated CRP / ESR.  Plain films evaluated and consisted with a commuted fracture of the distal portion of the second proximal phalanx with concern for medial ulceration vs open fracture raising concern for osteomyelitis.     Outpatient: Patient follows with Dr. Isidore Ruiz from radiation oncology, Dr. Benjamine Ruiz from ENT and Dr. Vaughan Ruiz from pulmonary  Past Medical History:   Past Medical History:  Diagnosis Date  . Bronchitis 03/2018  . COPD (chronic obstructive pulmonary disease) ([place])   . Herpes   . laryngeal ca dx'd 06/2018  . Lung nodule 2020  . 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     Micro Data:  COVID 2/1 >> negative  Sputum 2/1 >> providencia rettgeri & klebsiella pneumoniae >> mixed resistance patterns  C-Diff 2/3 >> negative    Antimicrobials:  Levofloxacin 2/5 >>   Interim History / Subjective:  Pt reports surgery went well, denies significant pain  Denies increase in sputum production  O2 weaned to 5L with sats 95% Afebrile   Objective   Blood pressure 99/60, pulse (!) 57, temperature 98 F (36.7 C), temperature source Oral, resp. rate 16, height 6' (1.829 m), weight 52.3 kg, SpO2 96 %.    FiO2 (%):  [28 %-40 %] 28 %   Intake/Output Summary (Last 24 hours) at 06/29/2020 1323 Last data filed at 06/29/2020 1102 Gross per 24 hour  Intake 464 ml  Output 5130 ml  Net -4666 ml   Filed Weights   06/23/20 1826 06/25/20 0616 06/26/20 0528  Weight: 49.9 kg 54 kg 52.3 kg    Examination: General: chronically ill appearing adult male lying in bed, family at bedside  HEENT: MM pink/moist, stoma clean/dry, aerosolized collar in place with 5L O2 Neuro: AAOx4, speech clear, MAE  CV: s1s2 RRR, no m/r/g PULM: non-labored on O2, lungs bilaterally clear anterior, fine crackles in bases  GI: soft, bsx4 active  Extremities: warm/dry, no edema, LLE wrapped  Skin: thin skin, multiple areas of bruising to bilateral forearms, few abrasions / scratches on arms  Resolved Hospital Problem list     Assessment & Plan:   Acute Hypoxemic Respiratory Failure Multifocal Pneumonia Osteomyelitis left foot Severe sepsis secondary to above COPD, baseline, no prior pulmonary function test, CT imaging with centrilobular and paraseptal emphysema Severe calorie protein malnutrition History of throat cancer status post resection, tracheostomy, now decannulated with recurrent oropharyngeal dysphagia, chronic pulmonary  aspiration and PEG tube dependence Chronic consolidations prior cavitary lesion on CT in October 2021 Severe muscle wasting and cachexia Anemia   Plan:  -continue pulmicort, brovana, yupelri  -PRN albuterol  -wean O2 back to room air for sats >88% -consider repeat CT this admit or in follow up with Dr. Vaughan Ruiz at  discharge -pulmonary hygiene as able, mobilize  -abx as planned per ID  -follow intermittent CXR  -aspiration precautions -follow up with Dr. Trixie Ruiz March 10th at 9:45   Labs   CBC: Recent Labs  Lab 06/25/20 0316 06/25/20 1652 06/26/20 0322 06/27/20 0318 06/28/20 0325 06/29/20 0300  WBC 8.4  --  8.2 7.1 6.1 7.2  NEUTROABS 7.2  --  7.1 5.6 4.6 5.8  HGB 7.5* 8.0* 8.3* 7.4* 7.3* 9.2*  HCT 25.6* 26.9* 28.0* 25.1* 23.8* 30.8*  MCV 89.8  --  89.5 89.6 87.2 89.0  PLT 278  --  285 232 212 599    Basic Metabolic Panel: Recent Labs  Lab 06/24/20 1550 06/25/20 0316 06/25/20 1652 06/26/20 0322 06/27/20 0318 06/28/20 0325 06/29/20 0300  NA  --  144  --  148* 136 139 140  K  --  4.0  --  3.0* 4.1 4.0 4.3  CL  --  107  --  110 105 101 98  CO2  --  26  --  $R'28 26 30 'gN$ 34*  GLUCOSE  --  70  --  96 99 93 78  BUN  --  38*  --  24* 20 25* 25*  CREATININE  --  0.82  --  0.70 0.57* 0.55* 0.65  CALCIUM  --  8.4*  --  8.3* 7.8* 8.0* 8.4*  MG 2.0 2.1 2.0 2.1 1.9  --  2.0  PHOS 5.1* 3.1 1.9* 2.7  --   --   --    GFR: Estimated Creatinine Clearance: 69 mL/min (by C-G formula based on SCr of 0.65 mg/dL). Recent Labs  Lab 06/23/20 1510 06/23/20 1838 06/24/20 0331 06/25/20 0316 06/26/20 0322 06/27/20 0318 06/28/20 0325 06/29/20 0300  PROCALCITON  --   --  0.71  --   --   --   --   --   WBC 12.5*  --  9.5   < > 8.2 7.1 6.1 7.2  LATICACIDVEN 2.3* 1.7  --   --   --   --   --   --    < > = values in this interval not displayed.    Liver Function Tests: Recent Labs  Lab 06/23/20 1510 06/25/20 0316  AST 22 23  ALT 14 15  ALKPHOS 92 65  BILITOT 0.4 0.3  PROT 6.9 6.1*  ALBUMIN 2.0* 2.5*   No results for input(s): LIPASE, AMYLASE in the last 168 hours. No results for input(s): AMMONIA in the last 168 hours.  ABG No results found for: PHART, PCO2ART, PO2ART, HCO3, TCO2, ACIDBASEDEF, O2SAT   Coagulation Profile: No results for input(s): INR, PROTIME in the last 168  hours.  Cardiac Enzymes: No results for input(s): CKTOTAL, CKMB, CKMBINDEX, TROPONINI in the last 168 hours.  HbA1C: No results found for: HGBA1C  CBG: Recent Labs  Lab 06/28/20 1141 06/28/20 1750 06/28/20 2343 06/29/20 0550 06/29/20 1151  GLUCAP 78 88 89 66* 86      Noe Gens, MSN, APRN, NP-C, AGACNP-BC [facility] 06/29/2020, 1:23 PM   Please see [email or web address] for pager details.   From 7A-7P if no response, please call [phone] After  hours, please call ELink [phone]

## 2020-06-29 NOTE — Op Note (Signed)
Surgeon: Surgeon(s): [name], DPM  Assistants: None Pre-operative diagnosis: left second toe  Post-operative diagnosis: same Procedure: Procedure(s) (LRB): AMPUTATION TOE left second toe (Left)  Pathology:  ID Type Source Tests Collected by Time Destination  1 : LEFT 2ND TOE Tissue PATH Digit amputation SURGICAL PATHOLOGY [name], DPM 06/29/2020 0719     Pertinent Intra-op findings: Minimal Intra-Op bleeding noted.  Osteomyelitis of the digit noted. Anesthesia: Monitor Anesthesia Care  Hemostasis: * No tourniquets in log * EBL: 5 mL  Materials: 3-0 Prolene Injectables: 14 cc of half percent Marcaine plain and 1% lidocaine plain one-to-one mixture Complications: None  Indications for surgery: A 65 y.o. male presents with left second digit osteomyelitis. Patient has failed all conservative therapy including but not limited to local wound care and IV antibiotics. He wishes to have surgical correction of the foot/deformity. It was determined that patient would benefit from left second digit amputation at the level of the metatarsophalangeal joint. Informed surgical risk consent was reviewed and read aloud to the patient.  I reviewed the films.  I have discussed my findings with the patient in great detail.  I have discussed all risks including but not limited to infection, stiffness, scarring, limp, disability, deformity, damage to blood vessels and nerves, numbness, poor healing, need for braces, arthritis, chronic pain, amputation, death.  All benefits and realistic expectations discussed in great detail.  I have made no promises as to the outcome.  I have provided realistic expectations.  I have offered the patient a 2nd opinion, which they have declined and assured me they preferred to proceed despite the risks   Procedure in detail: The patient was both verbally and visually identified by myself, the nursing staff, and anesthesia staff in the preoperative holding area. They were  then transferred to the operating room and placed on the operative table in supine position.  Attention was directed to the left second digit a racquet shaped incision was delineated using a skin marker.  Using #15 blade the incision was carried down skin into epidermis down to the level of the bone.  The digit was freed at the metatarsophalangeal joint.  The digit was sent to the pathology in standard technique of the left second toe.  At this time the metatarsal head appeared clear of infection.  Minimal Intra-Op bleeding noted.  All bleeders were ligated.  The wound was irrigated with saline solution thoroughly.  At this time no signs of infection noted.  The wound was primarily closed with 3-0 Prolene.  All bony prominences were adequately padded the wound was dressed with Betadine wet-to-dry dressing, Kerlix, Ace bandage  At the conclusion of the procedure the patient was awoken from anesthesia and found to have tolerated the procedure well any complications. There were transferred to PACU with vital signs stable and vascular status intact.  Boneta Lucks, DPM

## 2020-06-29 NOTE — Anesthesia Postprocedure Evaluation (Signed)
Anesthesia Post Note  Patient: Ronald Ruiz  Procedure(s) Performed: AMPUTATION TOE left second toe (Left Toe)     Patient location during evaluation: PACU Anesthesia Type: MAC Level of consciousness: awake and alert Pain management: pain level controlled Vital Signs Assessment: post-procedure vital signs reviewed and stable Respiratory status: spontaneous breathing Cardiovascular status: stable Anesthetic complications: no   No complications documented.  Last Vitals:  Vitals:   06/29/20 1150 06/29/20 1158  BP: 99/60   Pulse: (!) 57   Resp: 16   Temp: 36.7 C   SpO2: 100% 96%    Last Pain:  Vitals:   06/29/20 1150  TempSrc: Oral  PainSc:                  [name]

## 2020-06-30 ENCOUNTER — Encounter (HOSPITAL_COMMUNITY): Payer: Self-pay | Admitting: Podiatry

## 2020-06-30 DIAGNOSIS — J189 Pneumonia, unspecified organism: Secondary | ICD-10-CM | POA: Diagnosis not present

## 2020-06-30 DIAGNOSIS — J449 Chronic obstructive pulmonary disease, unspecified: Secondary | ICD-10-CM | POA: Diagnosis not present

## 2020-06-30 DIAGNOSIS — A419 Sepsis, unspecified organism: Secondary | ICD-10-CM | POA: Diagnosis not present

## 2020-06-30 DIAGNOSIS — J9601 Acute respiratory failure with hypoxia: Secondary | ICD-10-CM | POA: Diagnosis not present

## 2020-06-30 LAB — BASIC METABOLIC PANEL
Anion gap: 7 (ref 5–15)
BUN: 25 mg/dL — ABNORMAL HIGH (ref 8–23)
CO2: 32 mmol/L (ref 22–32)
Calcium: 8.3 mg/dL — ABNORMAL LOW (ref 8.9–10.3)
Chloride: 99 mmol/L (ref 98–111)
Creatinine, Ser: 0.64 mg/dL (ref 0.61–1.24)
GFR, Estimated: >60 mL/min (ref >60)
Glucose, Bld: 78 mg/dL (ref 70–99)
Potassium: 4.2 mmol/L (ref 3.5–5.1)
Sodium: 138 mmol/L (ref 135–145)

## 2020-06-30 LAB — CBC
HCT: 27.8 % — ABNORMAL LOW (ref 39.0–52.0)
Hemoglobin: 8.5 g/dL — ABNORMAL LOW (ref 13.0–17.0)
MCH: 26.8 pg (ref 26.0–34.0)
MCHC: 30.6 g/dL (ref 30.0–36.0)
MCV: 87.7 fL (ref 80.0–100.0)
Platelets: 242 10*3/uL (ref 150–400)
RBC: 3.17 MIL/uL — ABNORMAL LOW (ref 4.22–5.81)
RDW: 15.9 % — ABNORMAL HIGH (ref 11.5–15.5)
WBC: 6.7 10*3/uL (ref 4.0–10.5)
nRBC: 0 % (ref 0.0–0.2)

## 2020-06-30 LAB — GLUCOSE, CAPILLARY
Glucose-Capillary: 115 mg/dL — ABNORMAL HIGH (ref 70–99)
Glucose-Capillary: 117 mg/dL — ABNORMAL HIGH (ref 70–99)
Glucose-Capillary: 123 mg/dL — ABNORMAL HIGH (ref 70–99)
Glucose-Capillary: 77 mg/dL (ref 70–99)

## 2020-06-30 LAB — SURGICAL PATHOLOGY

## 2020-06-30 LAB — MAGNESIUM: Magnesium: 2 mg/dL (ref 1.7–2.4)

## 2020-06-30 MED ORDER — PROSOURCE TF PO LIQD
45.0000 mL | Freq: Every day | ORAL | Status: DC
  Administered 2020-07-01 – 2020-07-04 (×4): 45 mL
  Filled 2020-06-30 (×4): qty 45

## 2020-06-30 MED ORDER — SODIUM CHLORIDE 0.9 % IV BOLUS
500.0000 mL | Freq: Once | INTRAVENOUS | Status: AC
  Administered 2020-06-30: 500 mL via INTRAVENOUS

## 2020-06-30 MED ORDER — OSMOLITE 1.5 CAL PO LIQD
474.0000 mL | Freq: Four times a day (QID) | ORAL | Status: DC
  Administered 2020-06-30 – 2020-07-04 (×16): 474 mL
  Filled 2020-06-30 (×16): qty 474

## 2020-06-30 MED ORDER — FREE WATER
240.0000 mL | Freq: Two times a day (BID) | Status: DC
  Administered 2020-06-30 – 2020-07-05 (×11): 240 mL

## 2020-06-30 MED ORDER — SODIUM CHLORIDE 0.9 % IV SOLN
510.0000 mg | Freq: Once | INTRAVENOUS | Status: AC
  Administered 2020-06-30: 510 mg via INTRAVENOUS
  Filled 2020-06-30: qty 510

## 2020-06-30 NOTE — Progress Notes (Signed)
Nutrition Follow-up  DOCUMENTATION CODES:   Severe malnutrition in context of chronic illness,Underweight  INTERVENTION:  - will adjust TF regimen: 2 cartons (474 ml) Osmolite 1.5 QID with 45 ml Prosource TF (or equivalent) once/day and 60 ml free water before and after each TF bolus. - this regimen will provide 2880 kcal, 130 grams protein, and 1928 ml free water. - will order additional 240 ml free water BID to provide total water/day of 2408 ml.   NUTRITION DIAGNOSIS:   Severe Malnutrition related to chronic illness,cancer and cancer related treatments as evidenced by severe fat depletion,severe muscle depletion. -ongoing  GOAL:   Patient will meet greater than or equal to 90% of their needs -met with TF regimen  MONITOR:   TF tolerance,Labs,Weight trends,Skin  ASSESSMENT:   65 y.o. 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 He presented to the ED due to shortness of breath that began 2 days PTA and progressively worsened. EMS was called to the home and he was noted to be 80% on room air.  Able to talk with RN yesterday and today. Patient has been reporting ongoing hunger and concern that he is not receiving adequate nutrition with current TF regimen.  RN also reports that patient has been having hypoglycemic episodes at the beginning of day shift/end of night shift, as low as into the 50s mg/dl.   He is currently receiving 1 carton (325 ml each) [employer] 1.4 x5/day with 45 ml Prosource TF TID and 120 ml free water before and 120 ml after each TF bolus.   This regimen is providing 2395 kcal, 133 grams protein, and 2370 ml free water.  Patient reports that he has not been experiencing any discomfort with TF regimen provision.   Patient reports have a large quantity  of cartons of Osmolite 1.5 at home which were paid for out of pocket and that he would prefer to switch back to this formula, in part, for this reason.   At home, he was administering 2 cartons (474 ml) Osmolite 1.4 QID. This regimen provided 2840 kcal, 119 grams protein, and 1448 ml free water. He was also administering free water flushes, but amount was unclear at this time.   He usually administers the first bolus around 1100 each morning and last bolus is either around 2300 or at 0200-0300 d/t poor sleep quality. He reports restlessness and leg spasms keep him up. He has tried OTC medication for sleep but did not find it beneficial.   He states that diarrhea, present earlier in admission, has resolved and that rectal tube has since been removed.   Weight today is -2 lb compared to weight on 2/3.     Labs reviewed; CBGs: 117, 77 mg/dl, BUN: 25 mg/dl, Ca: 8.3 mg/dl. Medications reviewed; 40 mg protonix per PEG/day.  IVF; D5-NS @ 10 ml/hr (41 kcal/24 hours).   Diet Order:   Diet Order            Diet NPO time specified  Diet effective now                 EDUCATION NEEDS:   No education needs have been identified at this time  Skin:  Skin Assessment: Skin Integrity Issues: Skin Integrity Issues:: Other (Comment),Incisions Incisions: L2nd toe amputation (2/7) Other: MASD to perineum  Last BM:    2/4  Height:   Ht Readings from Last 1 Encounters:  06/23/20 6' (1.829 m)    Weight:   Wt Readings from Last 1 Encounters:  06/30/20 52.9 kg    Estimated Nutritional Needs:  Kcal:  2500-2700 kcal Protein:  120-135 grams Fluid:  >/= 2.5 L/day     Jessica Ostheim, MS, RD, LDN, CNSC Inpatient Clinical Dietitian RD pager # available in AMION  After hours/weekend pager # available in AMION  

## 2020-06-30 NOTE — TOC Progression Note (Signed)
Transition of Care [facility]) - Progression Note    Patient Details  Name: Ronald Ruiz MRN: [id number] Date of Birth: [birth year]  [facility] CM/SW Contact  Joaquin Courts, RN Phone Number: 06/30/2020, 2:20 PM  Clinical Narrative:    Updated enteral feeding form faxed to [employer] for patient's enteral feeding formula/supplies.    Expected Discharge Plan: [place] Barriers to Discharge: Continued Medical Work up  Expected Discharge Plan and Services Expected Discharge Plan: [place] In-house Referral: Clinical Social Work Discharge Planning Services: CM Consult Post Acute Care Choice: [place] arrangements for the past 2 months: Single Family Home                   DME Agency: [employer]       HH Arranged: PT,OT,RN,Nurse's Aide,Social Work [place]: [facility] [employer] [facility] [employer] Date [place]: 06/25/20 Time [place]: [place] ([place]) Interventions    Readmission Risk Interventions Readmission Risk Prevention Plan 07/04/2019  Transportation Screening Complete  Medication Review [employer]) Complete  PCP or Specialist appointment within 3-5 days of discharge Complete  HRI or [place] Complete  SW Recovery Care/Counseling Consult Complete  [place] Not Applicable  Some recent data might be hidden

## 2020-06-30 NOTE — Progress Notes (Signed)
PROGRESS NOTE    Ronald Ruiz  JOA:[id number] DOB: 01/25/1956 DOA: 06/23/2020 PCP: [name], MD    Chief Complaint  Patient presents with  . 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 Patient noted to be hypotensive with systolic blood pressures in the 80s, tachycardic with a heart rate of 105, sats of 83% on 10 L O2 via trach collar. Patient given some saline nebs with some improvement. Patient also noted to have a leukocytosis. Chest x-ray done concerning for multifocal pneumonia. Patient received a dose of IV Solu-Medrol, placed empirically on IV antibiotics and hospitalist called for admission.   Assessment & Plan:   Principal Problem:   Severe sepsis ([place]) Active Problems:   Acute respiratory failure with hypoxia (HCC)   Community acquired pneumonia   Malignant neoplasm of glottis (HCC)   Protein-calorie malnutrition, severe   Dysphagia   COPD (chronic obstructive pulmonary disease) (HCC)   Normocytic anemia   Chronic pulmonary aspiration   Diarrhea   Abnormally small toe   Hypernatremia   Closed nondisplaced fracture of lesser toe of left foot with malunion  1 severe sepsis and acute respiratory failure with hypoxia secondary to multifocal pneumonia Patient noted to be hypoxic on admission with sats of 83% on 10 L O2 via trach collar on arrival. Patient also with a leukocytosis, elevated lactic acid level, tachycardic with heart rate up to 105, chest x-ray with multifocal pneumonia. SARS coronavirus PCR negative. Patient pancultured results pending. Urine strep pneumococcus antigen negative, urine Legionella antigen pending. Sputum Gram stain and  cultures with abundant procidentia rettgeri/moderate Klebsiella pneumonia which is resistant to ampicillin and cefazolin but sensitive to cefepime, ceftazidime, Rocephin, ciprofloxacin, gentamicin, Bactrim. Currently on 5 L O2 with a FiO2 of 28% via trach collar. Patient seems comfortable and slowly improving clinically. Patient with no use of accessory muscles of respiration.  Continue current trach care/trach suctioning, Pulmicort nebs, yuprlri, Brovana as recommended per pulmonary.  IV Rocephin and oral doxycycline have been discontinued and patient started on Levaquin to complete a 10-day course of antibiotic treatment.  Discussed with ID, Dr. Johnnye Sima about antibiotic treatment.  Patient also seen by pulmonary during this hospitalization.  Outpatient follow-up with pulmonary.  2. COPD Stable. Patient with no active wheezing however patient presented with acute respiratory failure with hypoxia currently on 7 L trach collar with FiO2 of 35%.  Continue Pulmicort nebs, Brovana, YUPRLRI as recommended by pulmonary.  Pulmonary hygiene.  Wean O2.  Outpatient follow-up with pulmonary.  3. Borderline blood pressures/hypotension Patient with systolic blood pressures in the mid 80- 90s. Patient asymptomatic.  Patient noted to have baseline systolic blood pressures in the low 100s from prior office visits as noted on epic. Patient pancultured. Patient s/p IV albumin every 6 hours x24 hours.  Status post transfusion 1 unit packed red blood cells.  Normal saline 500 cc bolus x1.    4. History of throat cancer status post resection and tracheostomy, since decannulated/oropharyngeal dysphagia with chronic pulmonary aspiration now PEG dependent Keep n.p.o. Continue stoma and PEG tube care. Dietitian consulted.  5. Severe protein calorie malnutrition Secondary to chronic illness. Dietitian consulted and following.  Continue current tube feeds.    6. Anemia of chronic disease/iron deficiency anemia Patient with  no  overt bleeding, hemoglobin trickling down likely due to dilutional effect. Anemia panel consistent with anemia of chronic disease/iron deficiency. Iron level at 13. Hemoglobin currently at 8.5.  Status post transfusion 1 unit packed red blood cells.  We will give a dose of IV Feraheme x1.  Will likely need oral iron supplementation on discharge.   7. Abnormality of left second toe/left second toe commuted fracture/osteomyelitis of second left toe Left second toe dark with some thickening of the skin and tender to palpation.  Plain films done consistent with a commuted fracture involving distal portion of the second proximal phalanx with severe dislocation of the second middle phalanx relative to the second proximal phalanx.  CRP elevated at 9.2.  Sed rate elevated at 70.  Patient was on IV antibiotics has been transitioned to oral Levaquin.  Podiatry consulted and they assessed the patient and performed closed reduction of the left second toe with splinting.  Surgical shoe ordered.  Podiatry reviewed films of the foot again and due to concerns for probable osteomyelitis and patient subsequently underwent toe amputation today 06/29/2020 without any complications.  Per podiatry clean margins with no further osteomyelitis noted and amputation has treated osteomyelitis no extended antibiotics needed for osteomyelitis.  Outpatient follow-up with podiatry.  8. Diarrhea Patient with multiple loose stools per patient and per RN early on in the hospitalization which has since resolved.  C. difficile PCR negative.  WBC normalized.  Rectal tube has been discontinued.  9.  Hypernatremia Likely secondary to dehydration from numerous voluminous diarrhea.  Resolved on D5W.  IV fluids saline lock.  Follow.    10.  Hypokalemia Secondary to GI losses.  4.2.  Follow.    DVT prophylaxis: Lovenox Code Status: Full Family Communication: Updated patient.  No family at bedside.  Disposition:   Status is:  Inpatient    Dispo: The patient is from:               Anticipated d/c is to: Home with home health therapies              Anticipated d/c date is: 1-2 days.               Patient currently with multifocal pneumonia, with stoma, cold/borderline blood pressure, on antibiotics.  Concern for possible osteomyelitis and status post amputation 06/29/2020.  Oxygen being weaned down.  Not stable for discharge.     Difficult to place patient undetermined       Consultants:   Podiatry: Dr. Posey Pronto 06/26/2020  Close reduction of left second toe with splinting per Dr. Posey Pronto 06/26/2020  Left second toe amputation per podiatry, Dr. Posey Pronto 06/29/2020  Procedures:   Chest x-ray 06/23/2020  Plain films of left second toe 06/25/2020  Transfusion 1 unit packed red blood cells 06/28/2020  Antimicrobials:   Oral doxycycline to 06/23/2020>>>> 06/27/2020  IV cefepime 06/23/2018 22x1 dose  IV Rocephin to 06/23/2020>>>> 06/27/2020  IV vancomycin x1 dose  Omnicef 06/27/2020 x1 dose>>>> 06/27/2020  Oral Levaquin 06/27/2020>>>>>   Subjective: In bed.  Denies chest pain or shortness of breath.  No abdominal pain.  Overall feeling better.  No lightheadedness or dizziness.    Objective: Vitals:   06/30/20 0453 06/30/20 0500 06/30/20 0801 06/30/20 1037  BP: (!) 95/55   (!) 93/55  Pulse: (!) 55   60  Resp: 16   17  Temp: 98.6 F (37 C)   98.1 F (36.7 C)  TempSrc: Oral   Oral  SpO2: 98%  95% 99%  Weight:  52.9 kg    Height:        Intake/Output Summary (Last 24 hours) at 06/30/2020 1218 Last data filed at 06/30/2020 0800 Gross per 24 hour  Intake 697 ml  Output 1950 ml  Net -1253 ml   Filed Weights   06/25/20 0616 06/26/20 0528 06/30/20 0500  Weight: 54 kg 52.3 kg 52.9 kg    Examination:  General exam: Cachectic.  Frail.  Chronically ill-appearing.  Trach collar on.  Stoma.   Respiratory system: Clear to auscultation anterior lung fields.  No wheezes, no crackles.  Fair air movement.  No use of accessory  muscles of respiration.   Cardiovascular system: RRR no murmurs rubs or gallops.  No JVD.  No lower extremity edema.  Gastrointestinal system: Abdomen is soft, nontender, nondistended, positive bowel sounds.  No rebound.  No guarding.  PEG tube in place.  Central nervous system: Alert and oriented. No focal neurological deficits. Extremities: Left lower extremity in postop bandage.  Skin: No rashes, lesions or ulcers Psychiatry: Judgement and insight appear normal. Mood & affect appropriate.     Data Reviewed: I have personally reviewed following labs and imaging studies  CBC: Recent Labs  Lab 06/25/20 0316 06/25/20 1652 06/26/20 0322 06/27/20 0318 06/28/20 0325 06/29/20 0300 06/30/20 0325  WBC 8.4  --  8.2 7.1 6.1 7.2 6.7  NEUTROABS 7.2  --  7.1 5.6 4.6 5.8  --   HGB 7.5*   < > 8.3* 7.4* 7.3* 9.2* 8.5*  HCT 25.6*   < > 28.0* 25.1* 23.8* 30.8* 27.8*  MCV 89.8  --  89.5 89.6 87.2 89.0 87.7  PLT 278  --  285 232 212 232 242   < > = values in this interval not displayed.    Basic Metabolic Panel: Recent Labs  Lab 06/24/20 1550 06/25/20 0316 06/25/20 1652 06/26/20 0322 06/27/20 0318 06/28/20 0325 06/29/20 0300 06/30/20 0325  NA  --  144  --  148* 136 139 140 138  K  --  4.0  --  3.0* 4.1 4.0 4.3 4.2  CL  --  107  --  110 105 101 98 99  CO2  --  26  --  28 26 30  34* 32  GLUCOSE  --  70  --  96 99 93 78 78  BUN  --  38*  --  24* 20 25* 25* 25*  CREATININE  --  0.82  --  0.70 0.57* 0.55* 0.65 0.64  CALCIUM  --  8.4*  --  8.3* 7.8* 8.0* 8.4* 8.3*  MG 2.0 2.1 2.0 2.1 1.9  --  2.0 2.0  PHOS 5.1* 3.1 1.9* 2.7  --   --   --   --     GFR: Estimated Creatinine Clearance: 69.8 mL/min (by C-G formula based on SCr of 0.64 mg/dL).  Liver Function Tests: Recent Labs  Lab 06/23/20 1510 06/25/20 0316  AST 22 23  ALT 14 15  ALKPHOS 92 65  BILITOT 0.4 0.3  PROT 6.9 6.1*  ALBUMIN 2.0* 2.5*    CBG: Recent Labs  Lab 06/29/20 1151 06/29/20 1655 06/30/20 0012  06/30/20 0612 06/30/20 1155  GLUCAP 86 102* 117* 77 115*     Recent Results (from the past 240 hour(s))  SARS Coronavirus 2 by RT PCR (hospital order, performed in [facility] hospital lab) Nasopharyngeal Nasopharyngeal Swab     Status: None   Collection Time: 06/23/20  3:10 PM   Specimen:  Nasopharyngeal Swab  Result Value Ref Range Status   SARS Coronavirus 2 NEGATIVE NEGATIVE Final    Comment: (NOTE) SARS-CoV-2 target nucleic acids are NOT DETECTED.  The SARS-CoV-2 RNA is generally detectable in upper and lower respiratory specimens during the acute phase of infection. The lowest concentration of SARS-CoV-2 viral copies this assay can detect is 250 copies / mL. A negative result does not preclude SARS-CoV-2 infection and should not be used as the sole basis for treatment or other patient management decisions.  A negative result may occur with improper specimen collection / handling, submission of specimen other than nasopharyngeal swab, presence of viral mutation(s) within the areas targeted by this assay, and inadequate number of viral copies (<250 copies / mL). A negative result must be combined with clinical observations, patient history, and epidemiological information.  Fact Sheet for Patients:   StrictlyIdeas.no  Fact Sheet for Healthcare Providers: BankingDealers.co.za  This test is not yet approved or  cleared by the [place] FDA and has been authorized for detection and/or diagnosis of SARS-CoV-2 by FDA under an Emergency Use Authorization (EUA).  This EUA will remain in effect (meaning this test can be used) for the duration of the COVID-19 declaration under Section 564(b)(1) of the Act, 21 U.S.C. section 360bbb-3(b)(1), unless the authorization is terminated or revoked sooner.  Performed at [facility], [place] [address], [place], [place] [address]   Culture, blood (routine x 2)     Status:  None   Collection Time: 06/23/20  6:13 PM   Specimen: BLOOD  Result Value Ref Range Status   Specimen Description   Final    BLOOD LEFT ANTECUBITAL Performed at [place] [address] [place], [place] [address]    Special Requests   Final    BOTTLES DRAWN AEROBIC AND ANAEROBIC Blood Culture results may not be optimal due to an inadequate volume of blood received in culture bottles Performed at [place] [address], [place], [place] [address]    Culture   Final    NO GROWTH 5 DAYS Performed at [facility], [place] [address], [place], [place] [address]    Report Status 06/28/2020 FINAL  Final  Culture, blood (routine x 2)     Status: None   Collection Time: 06/23/20  6:38 PM   Specimen: BLOOD  Result Value Ref Range Status   Specimen Description   Final    BLOOD RIGHT ANTECUBITAL Performed at [place] [address], [place], [place] [address]    Special Requests   Final    BOTTLES DRAWN AEROBIC AND ANAEROBIC Blood Culture results may not be optimal due to an inadequate volume of blood received in culture bottles Performed at [place] [address], [place], [place] [address]    Culture   Final    NO GROWTH 5 DAYS Performed at [facility], [place] [address], [place], [place] [address]    Report Status 06/28/2020 FINAL  Final  Culture, sputum-assessment     Status: None   Collection Time: 06/23/20  7:24 PM   Specimen: Expectorated Sputum  Result Value Ref Range Status   Specimen Description EXPECTORATED SPUTUM  Final   Special Requests NONE  Final   Sputum evaluation   Final    THIS SPECIMEN IS ACCEPTABLE FOR SPUTUM CULTURE Performed at [facility], [place] [address] [place], [place] [address]    Report Status 06/23/2020  FINAL  Final  Culture, respiratory     Status: None   Collection Time: 06/23/20  7:24 PM  Result Value Ref Range Status    Specimen Description   Final    EXPECTORATED SPUTUM Performed at [place] [address], [place], [place] [address]    Special Requests   Final    NONE Reflexed from [id number] Performed at [place] [address], [place], [place] [address]    Gram Stain   Final    ABUNDANT WBC PRESENT, PREDOMINANTLY PMN ABUNDANT GRAM NEGATIVE RODS MODERATE GRAM POSITIVE RODS FEW GRAM POSITIVE COCCI    Culture   Final    ABUNDANT PROVIDENCIA RETTGERI MODERATE KLEBSIELLA PNEUMONIAE WITHIN NORMAL RESPIRATORY FLORA Performed at [facility], [place] [address], [place], [place] [address]    Report Status 06/27/2020 FINAL  Final   Organism ID, Bacteria PROVIDENCIA RETTGERI  Final   Organism ID, Bacteria KLEBSIELLA PNEUMONIAE  Final      Susceptibility   Klebsiella pneumoniae - MIC*    AMPICILLIN >=32 RESISTANT Resistant     CEFAZOLIN <=4 SENSITIVE Sensitive     CEFEPIME <=0.12 SENSITIVE Sensitive     CEFTAZIDIME <=1 SENSITIVE Sensitive     CEFTRIAXONE <=0.25 SENSITIVE Sensitive     CIPROFLOXACIN <=0.25 SENSITIVE Sensitive     GENTAMICIN <=1 SENSITIVE Sensitive     IMIPENEM <=0.25 SENSITIVE Sensitive     TRIMETH/SULFA <=20 SENSITIVE Sensitive     AMPICILLIN/SULBACTAM 4 SENSITIVE Sensitive     PIP/TAZO <=4 SENSITIVE Sensitive     * MODERATE KLEBSIELLA PNEUMONIAE   Providencia rettgeri - MIC*    AMPICILLIN >=32 RESISTANT Resistant     CEFAZOLIN >=64 RESISTANT Resistant     CEFEPIME 1 SENSITIVE Sensitive     CEFTAZIDIME <=1 SENSITIVE Sensitive     CEFTRIAXONE <=0.25 SENSITIVE Sensitive     CIPROFLOXACIN <=0.25 SENSITIVE Sensitive     GENTAMICIN <=1 SENSITIVE Sensitive     IMIPENEM 1 SENSITIVE Sensitive     TRIMETH/SULFA <=20 SENSITIVE Sensitive     AMPICILLIN/SULBACTAM 16 INTERMEDIATE Intermediate     PIP/TAZO >=128 RESISTANT Resistant     * ABUNDANT PROVIDENCIA RETTGERI  C Difficile Quick Screen w PCR reflex     Status: None   Collection  Time: 06/25/20 11:35 AM   Specimen: STOOL  Result Value Ref Range Status   C Diff antigen NEGATIVE NEGATIVE Final   C Diff toxin NEGATIVE NEGATIVE Final   C Diff interpretation No C. difficile detected.  Final    Comment: Performed at [facility], [place] [address], [place], [place] [address]  Surgical pcr screen     Status: None   Collection Time: 06/28/20  6:30 PM   Specimen: Nasal Mucosa; Nasal Swab  Result Value Ref Range Status   MRSA, PCR NEGATIVE NEGATIVE Final   Staphylococcus aureus NEGATIVE NEGATIVE Final    Comment: (NOTE) The Xpert SA Assay (FDA approved for NASAL specimens in patients 12 years of age and older), is one component of a comprehensive surveillance program. It is not intended to diagnose infection nor to guide or monitor treatment. Performed at [facility], [place] [address], [place], [place] [address]          Radiology Studies: DG Foot Complete Left  Result Date: 06/29/2020 CLINICAL DATA:  Status post second toe amputation. EXAM: LEFT FOOT - COMPLETE 3+ VIEW COMPARISON:  June 26, 2020. FINDINGS: Status post amputation of the of phalanges  of the second toe. Expected postsurgical findings are seen in the soft tissues. Hallux valgus deformity of the first metatarsophalangeal joint is noted with mild osteophyte formation. No other fracture or bony abnormality is noted. IMPRESSION: Status post amputation of phalanges of the second toe. No other significant abnormality is noted. Electronically Signed   By: Marijo Conception M.D.   On: 06/29/2020 08:49   VAS [place] ABI WITH/WO TBI  Result Date: 06/29/2020 LOWER EXTREMITY DOPPLER STUDY Indications: Peripheral artery disease, and S/P LLE 2nd toe amputation -              evaluate blood flow. High Risk Factors: Past history of smoking. Other Factors: COPD, HX throat CA (s/p tracheostomy & PEG tube placement for                dysphagia).  Comparison Study: No previous exams Performing  Technologist: Hill, Jody RVT, RDMS  Examination Guidelines: A complete evaluation includes at minimum, Doppler waveform signals and systolic blood pressure reading at the level of bilateral brachial, anterior tibial, and posterior tibial arteries, when vessel segments are accessible. Bilateral testing is considered an integral part of a complete examination. Photoelectric Plethysmograph (PPG) waveforms and toe systolic pressure readings are included as required and additional duplex testing as needed. Limited examinations for reoccurring indications may be performed as noted.  ABI Findings: +--------+------------------+-----+----------+--------+ Right   Rt Pressure (mmHg)IndexWaveform  Comment  +--------+------------------+-----+----------+--------+ Brachial90                     biphasic           +--------+------------------+-----+----------+--------+ PTA     56                0.62 monophasic         +--------+------------------+-----+----------+--------+ DP      54                0.60 monophasic         +--------+------------------+-----+----------+--------+ +--------+------------------+-----+----------+---------------+ Left    Lt Pressure (mmHg)IndexWaveform  Comment         +--------+------------------+-----+----------+---------------+ Brachial                       biphasic  IV in upper arm +--------+------------------+-----+----------+---------------+ PTA     68                0.76 monophasic                +--------+------------------+-----+----------+---------------+ DP      61                0.68 monophasic                +--------+------------------+-----+----------+---------------+  Summary: Right: Resting right ankle-brachial index indicates moderate right lower extremity arterial disease. Left: Resting left ankle-brachial index indicates moderate left lower extremity arterial disease.  *See table(s) above for measurements and observations.  Electronically  signed by Jamelle Haring on 06/29/2020 at 5:06:02 PM.   Final         Scheduled Meds: . sodium chloride   Intravenous Once  . arformoterol  15 mcg Nebulization BID  . budesonide  0.5 mg Nebulization BID  . feeding supplement (OSMOLITE 1.5 CAL)  474 mL Per Tube QID  . [START ON 07/01/2020] feeding supplement (PROSource TF)  45 mL Per Tube Daily  . free water  240 mL Per Tube BID  . guaiFENesin  15 mL Per Tube Q6H  .  levofloxacin  750 mg Per Tube Daily  . loratadine  10 mg Per Tube Daily  . methadone  120 mg Per Tube Daily  . pantoprazole sodium  40 mg Per Tube Daily  . revefenacin  175 mcg Nebulization Daily   Continuous Infusions: . dextrose 5 % and 0.9% NaCl 10 mL/hr at 06/29/20 0552     LOS: 6 days    Time spent: 40 minutes    Irine Seal, MD [facility]   To contact the attending provider between 7A-7P or the covering provider during after hours 7P-7A, please log into the web site [email or web address] and access using universal  password for that web site. If you do not have the password, please call the hospital operator.  06/30/2020, 12:18 PM

## 2020-06-30 NOTE — Progress Notes (Signed)
I have reviewed and concur with students documentation.  

## 2020-06-30 NOTE — Progress Notes (Signed)
Pt ATC placed on medical air at 5L for humidification.  This is 21% (room air)

## 2020-06-30 NOTE — Plan of Care (Signed)
Plan of care reviewed and discussed with the patient. 

## 2020-07-01 DIAGNOSIS — R6520 Severe sepsis without septic shock: Secondary | ICD-10-CM | POA: Diagnosis not present

## 2020-07-01 DIAGNOSIS — A419 Sepsis, unspecified organism: Secondary | ICD-10-CM | POA: Diagnosis not present

## 2020-07-01 LAB — CBC WITH DIFFERENTIAL/PLATELET
Abs Immature Granulocytes: 0.03 10*3/uL (ref 0.00–0.07)
Basophils Absolute: 0 10*3/uL (ref 0.0–0.1)
Basophils Relative: 1 %
Eosinophils Absolute: 0.1 10*3/uL (ref 0.0–0.5)
Eosinophils Relative: 2 %
HCT: 30.2 % — ABNORMAL LOW (ref 39.0–52.0)
Hemoglobin: 9.3 g/dL — ABNORMAL LOW (ref 13.0–17.0)
Immature Granulocytes: 1 %
Lymphocytes Relative: 12 %
Lymphs Abs: 0.8 10*3/uL (ref 0.7–4.0)
MCH: 27 pg (ref 26.0–34.0)
MCHC: 30.8 g/dL (ref 30.0–36.0)
MCV: 87.8 fL (ref 80.0–100.0)
Monocytes Absolute: 0.7 10*3/uL (ref 0.1–1.0)
Monocytes Relative: 12 %
Neutro Abs: 4.5 10*3/uL (ref 1.7–7.7)
Neutrophils Relative %: 72 %
Platelets: 261 10*3/uL (ref 150–400)
RBC: 3.44 MIL/uL — ABNORMAL LOW (ref 4.22–5.81)
RDW: 16.2 % — ABNORMAL HIGH (ref 11.5–15.5)
WBC: 6.2 10*3/uL (ref 4.0–10.5)
nRBC: 0 % (ref 0.0–0.2)

## 2020-07-01 LAB — BASIC METABOLIC PANEL
Anion gap: 7 (ref 5–15)
BUN: 31 mg/dL — ABNORMAL HIGH (ref 8–23)
CO2: 33 mmol/L — ABNORMAL HIGH (ref 22–32)
Calcium: 8.5 mg/dL — ABNORMAL LOW (ref 8.9–10.3)
Chloride: 102 mmol/L (ref 98–111)
Creatinine, Ser: 0.7 mg/dL (ref 0.61–1.24)
GFR, Estimated: >60 mL/min (ref >60)
Glucose, Bld: 73 mg/dL (ref 70–99)
Potassium: 4.5 mmol/L (ref 3.5–5.1)
Sodium: 142 mmol/L (ref 135–145)

## 2020-07-01 LAB — GLUCOSE, CAPILLARY
Glucose-Capillary: 104 mg/dL — ABNORMAL HIGH (ref 70–99)
Glucose-Capillary: 171 mg/dL — ABNORMAL HIGH (ref 70–99)
Glucose-Capillary: 91 mg/dL (ref 70–99)
Glucose-Capillary: 94 mg/dL (ref 70–99)

## 2020-07-01 LAB — MAGNESIUM: Magnesium: 2.4 mg/dL (ref 1.7–2.4)

## 2020-07-01 NOTE — TOC Progression Note (Signed)
Transition of Care [facility]) - Progression Note    Patient Details  Name: Ronald Ruiz MRN: [id number] Date of Birth: 28-Apr-1956  Transition of [facility] CM/SW Contact  Lennart Pall, LCSW Phone Number: 07/01/2020, 5:02 PM  Clinical Narrative:    Alerted by MD that pt and family now want to pursue SNF - met briefly with them this afternoon to confirm and they would prefer [facility] area if possible.  TOC will begin SNF bed search process.   Expected Discharge Plan: [place] Barriers to Discharge: Continued Medical Work up  Expected Discharge Plan and Services Expected Discharge Plan: [place] In-house Referral: Clinical Social Work Discharge Planning Services: CM Consult Post Acute Care Choice: [place] arrangements for the past 2 months: Single Family Home                   DME Agency: [employer]       HH Arranged: PT,OT,RN,Nurse's Aide,Social Work [place]: [facility] [employer] Date [place]: 06/25/20 Time [place]: [place] ([place]) Interventions    Readmission Risk Interventions Readmission Risk Prevention Plan 07/04/2019  Transportation Screening Complete  Medication Review Press photographer) Complete  PCP or Specialist appointment within 3-5 days of discharge Complete  HRI or [place] Complete  SW Recovery Care/Counseling Consult Complete  [place] Not Applicable  Some recent data might be hidden

## 2020-07-01 NOTE — Progress Notes (Signed)
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 Patient noted to be hypotensive with systolic blood pressures in the 80s, tachycardic with a heart rate of 105, sats of 83% on 10 L O2 via trach collar. Patient given some saline nebs with some improvement. Patient also noted to have a leukocytosis. Chest x-ray done concerning for multifocal pneumonia. Patient received a dose of IV Solu-Medrol, placed empirically on IV antibiotics and hospitalist called for admission.   New events last 24 hours / Subjective: Patient feeling well overall.  Now on FiO2 21% with humidified air.  He denies any worsening shortness of breath or worsening cough from baseline.  Worried regarding his ambulation, looking into SNF placement  Assessment & Plan:   Principal Problem:   Severe sepsis ([place]) Active Problems:   Malignant neoplasm of glottis (HCC)   Protein-calorie malnutrition, severe   Dysphagia   COPD (chronic obstructive pulmonary disease) (HCC)   Normocytic anemia   Chronic pulmonary aspiration   Acute respiratory failure with hypoxia ([place])   Community acquired pneumonia   Diarrhea   Abnormally small toe   Hypernatremia   Closed nondisplaced fracture of lesser toe of left foot with malunion   Severe sepsis and acute respiratory failure with hypoxia secondary to multifocal pneumonia -Patient noted to be hypoxic on admission with sats of 83% on 10 L O2 via trach collar on arrival. Patient also with a leukocytosis, elevated lactic acid level, tachycardic with heart rate up to 105, chest x-ray with multifocal  pneumonia. SARS coronavirus PCR negative. Urine strep pneumococcus antigen negative, urine Legionella antigen negative. Sputum Gram stain and cultures with abundant procidentia rettgeri/moderate Klebsiella pneumonia which is resistant to ampicillin and cefazolin but sensitive to cefepime, ceftazidime, Rocephin, ciprofloxacin, gentamicin, Bactrim.  -IV Rocephin and oral doxycycline have been discontinued and patient started on Levaquin to complete a 10-day course of antibiotic treatment. Dr. Grandville Silos discussed with ID, Dr. Johnnye Sima about antibiotic treatment.  Patient also seen by pulmonary during this hospitalization.  Outpatient follow-up with pulmonary. -Now on room air   COPD -Continue Pulmicort nebs, Brovana, yupelri as recommended by pulmonary.  Pulmonary hygiene.   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 Continue stoma and PEG tube care. Dietitian consulted.  Severe protein calorie malnutrition -Secondary to chronic illness. Dietitian consulted and following.  Continue current tube feeds.    Anemia of chronic disease/iron deficiency anemia -Patient with no overt bleeding, hemoglobin trickling down likely due to dilutional effect. Anemia panel consistent with anemia of chronic disease/iron deficiency. Iron level at 13. Status post transfusion 1 unit packed red blood cells, IV Feraheme x1.  Will likely need oral iron supplementation on discharge.   Abnormality of left second toe/left second toe commuted fracture/osteomyelitis of second left toe -Left second toe dark with some thickening of the skin and tender to palpation.  Plain films done consistent with a commuted fracture involving distal portion of the second proximal phalanx with severe dislocation of the second middle phalanx relative to the second proximal  phalanx.  CRP elevated at 9.2.  Sed rate elevated at 70.  Patient was on IV antibiotics has been transitioned to oral Levaquin.  Podiatry consulted and they assessed the patient and performed closed reduction of the left second toe with splinting.  Surgical shoe ordered.  Podiatry reviewed films of the foot again and due to concerns for probable osteomyelitis and patient subsequently underwent toe amputation 06/29/2020 without any complications.  Per podiatry clean margins with no further osteomyelitis noted and amputation has treated osteomyelitis no extended antibiotics needed for osteomyelitis.  Outpatient follow-up with podiatry.  Diarrhea -Patient with multiple loose stools per patient and per RN early on in the hospitalization which has since resolved.  C. difficile PCR negative.  WBC normalized.  Rectal tube has been discontinued.  Chronic pain -Continue methadone     DVT prophylaxis:  Place and maintain sequential compression device Start: 06/30/20 0932  Code Status: Full code Family Communication: Daughter at bedside Disposition Plan:  Status is: Inpatient  Remains inpatient appropriate because:Unsafe d/c plan   Dispo: The patient is from: Home              Anticipated d/c is to: SNF              Anticipated d/c date is: 1 day              Patient currently is medically stable to d/c.   Difficult to place patient No   Antimicrobials:  Anti-infectives (From admission, onward)   Start     Dose/Rate Route Frequency Ordered Stop   06/27/20 1700  levofloxacin (LEVAQUIN) 25 MG/ML solution 750 mg        750 mg Per Tube Daily 06/27/20 1437     06/27/20 1230  cefdinir (OMNICEF) 125 MG/5ML suspension 300 mg  Status:  Discontinued        300 mg Per Tube 2 times daily 06/27/20 1140 06/27/20 1437   06/23/20 2200  cefTRIAXone (ROCEPHIN) 2 g in sodium chloride 0.9 % 100 mL IVPB  Status:  Discontinued        2 g 200 mL/hr over 30 Minutes Intravenous Every 24 hours 06/23/20 1926  06/27/20 1140   06/23/20 2200  doxycycline (VIBRA-TABS) tablet 100 mg  Status:  Discontinued        100 mg Per Tube Every 12 hours 06/23/20 1941 06/27/20 1437   06/23/20 2000  azithromycin (ZITHROMAX) 500 mg in sodium chloride 0.9 % 250 mL IVPB  Status:  Discontinued        500 mg 250 mL/hr over 60 Minutes Intravenous Every 24 hours 06/23/20 1926 06/23/20 1938   06/23/20 1845  vancomycin (VANCOREADY) IVPB 1250 mg/250 mL        1,250 mg 166.7 mL/hr over 90 Minutes Intravenous  Once 06/23/20 1839 06/23/20 2055   06/23/20 1830  ceFEPIme (MAXIPIME) 2 g in sodium chloride 0.9 % 100 mL IVPB        2 g 200 mL/hr over 30 Minutes Intravenous  Once 06/23/20 1815 06/23/20 1920        Objective: Vitals:   07/01/20 0818 07/01/20 0900 07/01/20 1154 07/01/20 1412  BP:    93/64  Pulse:  64  65  Resp:  16    Temp:    (!) 97.5 F (36.4 C)  TempSrc:    Oral  SpO2: 95% 96% 95% 93%  Weight:      Height:        Intake/Output Summary (Last 24 hours) at 07/01/2020 1529 Last data filed at 07/01/2020  1156 Gross per 24 hour  Intake 2608 ml  Output 2900 ml  Net -292 ml   Filed Weights   06/30/20 0500 06/30/20 2206 07/01/20 0500  Weight: 52.9 kg 53.6 kg 52.2 kg    Examination:  General exam: Appears calm and comfortable  Respiratory system: Clear to auscultation. Respiratory effort normal. No respiratory distress. No conversational dyspnea.  Trach collar in place with humidified air, FiO2 21% Cardiovascular system: S1 & S2 heard, RRR. No murmurs. No pedal edema. Gastrointestinal system: Abdomen is nondistended, soft and nontender. Normal bowel sounds heard. Central nervous system: Alert and oriented. No focal neurological deficits. Speech clear.  Extremities: Left foot in boot Psychiatry: Judgement and insight appear normal. Mood & affect appropriate.   Data Reviewed: I have personally reviewed following labs and imaging studies  CBC: Recent Labs  Lab 06/26/20 0322 06/27/20 0318  06/28/20 0325 06/29/20 0300 06/30/20 0325 07/01/20 0339  WBC 8.2 7.1 6.1 7.2 6.7 6.2  NEUTROABS 7.1 5.6 4.6 5.8  --  4.5  HGB 8.3* 7.4* 7.3* 9.2* 8.5* 9.3*  HCT 28.0* 25.1* 23.8* 30.8* 27.8* 30.2*  MCV 89.5 89.6 87.2 89.0 87.7 87.8  PLT 285 232 212 232 242 338   Basic Metabolic Panel: Recent Labs  Lab 06/24/20 1550 06/24/20 1550 06/25/20 0316 06/25/20 1652 06/26/20 0322 06/27/20 0318 06/28/20 0325 06/29/20 0300 06/30/20 0325 07/01/20 0339  NA  --    < > 144  --  148* 136 139 140 138 142  K  --    < > 4.0  --  3.0* 4.1 4.0 4.3 4.2 4.5  CL  --    < > 107  --  110 105 101 98 99 102  CO2  --    < > 26  --  28 26 30  34* 32 33*  GLUCOSE  --    < > 70  --  96 99 93 78 78 73  BUN  --    < > 38*  --  24* 20 25* 25* 25* 31*  CREATININE  --    < > 0.82  --  0.70 0.57* 0.55* 0.65 0.64 0.70  CALCIUM  --    < > 8.4*  --  8.3* 7.8* 8.0* 8.4* 8.3* 8.5*  MG 2.0  --  2.1 2.0 2.1 1.9  --  2.0 2.0 2.4  PHOS 5.1*  --  3.1 1.9* 2.7  --   --   --   --   --    < > = values in this interval not displayed.   GFR: Estimated Creatinine Clearance: 68.9 mL/min (by C-G formula based on SCr of 0.7 mg/dL). Liver Function Tests: Recent Labs  Lab 06/25/20 0316  AST 23  ALT 15  ALKPHOS 65  BILITOT 0.3  PROT 6.1*  ALBUMIN 2.5*   No results for input(s): LIPASE, AMYLASE in the last 168 hours. No results for input(s): AMMONIA in the last 168 hours. Coagulation Profile: No results for input(s): INR, PROTIME in the last 168 hours. Cardiac Enzymes: No results for input(s): CKTOTAL, CKMB, CKMBINDEX, TROPONINI in the last 168 hours. BNP (last 3 results) No results for input(s): PROBNP in the last 8760 hours. HbA1C: No results for input(s): HGBA1C in the last 72 hours. CBG: Recent Labs  Lab 06/30/20 1155 06/30/20 1843 07/01/20 0036 07/01/20 0535 07/01/20 1140  GLUCAP 115* 123* 171* 94 91   Lipid Profile: No results for input(s): CHOL, HDL, LDLCALC, TRIG, CHOLHDL, LDLDIRECT in the last 72  hours. Thyroid Function Tests: No results for input(s): TSH, T4TOTAL, FREET4, T3FREE, THYROIDAB in the last 72 hours. Anemia Panel: No results for input(s): VITAMINB12, FOLATE, FERRITIN, TIBC, IRON, RETICCTPCT in the last 72 hours. Sepsis Labs: No results for input(s): PROCALCITON, LATICACIDVEN in the last 168 hours.  Recent Results (from the past 240 hour(s))  SARS Coronavirus 2 by RT PCR (hospital order, performed in [facility] hospital lab) Nasopharyngeal Nasopharyngeal Swab     Status: None   Collection Time: 06/23/20  3:10 PM   Specimen: Nasopharyngeal Swab  Result Value Ref Range Status   SARS Coronavirus 2 NEGATIVE NEGATIVE Final    Comment: (NOTE) SARS-CoV-2 target nucleic acids are NOT DETECTED.  The SARS-CoV-2 RNA is generally detectable in upper and lower respiratory specimens during the acute phase of infection. The lowest concentration of SARS-CoV-2 viral copies this assay can detect is 250 copies / mL. A negative result does not preclude SARS-CoV-2 infection and should not be used as the sole basis for treatment or other patient management decisions.  A negative result may occur with improper specimen collection / handling, submission of specimen other than nasopharyngeal swab, presence of viral mutation(s) within the areas targeted by this assay, and inadequate number of viral copies (<250 copies / mL). A negative result must be combined with clinical observations, patient history, and epidemiological information.  Fact Sheet for Patients:   StrictlyIdeas.no  Fact Sheet for Healthcare Providers: BankingDealers.co.za  This test is not yet approved or  cleared by the [place] FDA and has been authorized for detection and/or diagnosis of SARS-CoV-2 by FDA under an Emergency Use Authorization (EUA).  This EUA will remain in effect (meaning this test can be used) for the duration of the COVID-19 declaration under  Section 564(b)(1) of the Act, 21 U.S.C. section 360bbb-3(b)(1), unless the authorization is terminated or revoked sooner.  Performed at [facility], [place] [address] [place], [place] [address]   Culture, blood (routine x 2)     Status: None   Collection Time: 06/23/20  6:13 PM   Specimen: BLOOD  Result Value Ref Range Status   Specimen Description   Final    BLOOD LEFT ANTECUBITAL Performed at [place] [facility] [address], [place], [place] [address]    Special Requests   Final    BOTTLES DRAWN AEROBIC AND ANAEROBIC Blood Culture results may not be optimal due to an inadequate volume of blood received in culture bottles Performed at [place] [address], [place], [place] [address]    Culture   Final    NO GROWTH 5 DAYS Performed at [facility], [place] [address], [place], [place] [address]    Report Status 06/28/2020 FINAL  Final  Culture, blood (routine x 2)     Status: None   Collection Time: 06/23/20  6:38 PM   Specimen: BLOOD  Result Value Ref Range Status   Specimen Description   Final    BLOOD RIGHT ANTECUBITAL Performed at [place] [address] [place], [place] [address]    Special Requests   Final    BOTTLES DRAWN AEROBIC AND ANAEROBIC Blood Culture results may not be optimal due to an inadequate volume of blood received in culture bottles Performed at [place] [address] [place], [place] [address]    Culture   Final    NO GROWTH 5 DAYS Performed at [facility], [place] [address],  [place], [place] [address]    Report Status 06/28/2020 FINAL  Final  Culture, sputum-assessment     Status: None   Collection Time: 06/23/20  7:24 PM   Specimen: Expectorated Sputum  Result Value Ref Range Status   Specimen Description EXPECTORATED SPUTUM  Final   Special Requests NONE  Final   Sputum evaluation   Final    THIS SPECIMEN IS ACCEPTABLE FOR  SPUTUM CULTURE Performed at [facility], [place] [address], [place], [place] [address]    Report Status 06/23/2020 FINAL  Final  Culture, respiratory     Status: None   Collection Time: 06/23/20  7:24 PM  Result Value Ref Range Status   Specimen Description   Final    EXPECTORATED SPUTUM Performed at [place] [address], [place], [place] [address]    Special Requests   Final    NONE Reflexed from [id number] Performed at [place] [address] [place], [place] [address]    Gram Stain   Final    ABUNDANT WBC PRESENT, PREDOMINANTLY PMN ABUNDANT GRAM NEGATIVE RODS MODERATE GRAM POSITIVE RODS FEW GRAM POSITIVE COCCI    Culture   Final    ABUNDANT PROVIDENCIA RETTGERI MODERATE KLEBSIELLA PNEUMONIAE WITHIN NORMAL RESPIRATORY FLORA Performed at [facility], [place] [address], [place], [place] [address]    Report Status 06/27/2020 FINAL  Final   Organism ID, Bacteria PROVIDENCIA RETTGERI  Final   Organism ID, Bacteria KLEBSIELLA PNEUMONIAE  Final      Susceptibility   Klebsiella pneumoniae - MIC*    AMPICILLIN >=32 RESISTANT Resistant     CEFAZOLIN <=4 SENSITIVE Sensitive     CEFEPIME <=0.12 SENSITIVE Sensitive     CEFTAZIDIME <=1 SENSITIVE Sensitive     CEFTRIAXONE <=0.25 SENSITIVE Sensitive     CIPROFLOXACIN <=0.25 SENSITIVE Sensitive     GENTAMICIN <=1 SENSITIVE Sensitive     IMIPENEM <=0.25 SENSITIVE Sensitive     TRIMETH/SULFA <=20 SENSITIVE Sensitive     AMPICILLIN/SULBACTAM 4 SENSITIVE Sensitive     PIP/TAZO <=4 SENSITIVE Sensitive     * MODERATE KLEBSIELLA PNEUMONIAE   Providencia rettgeri - MIC*    AMPICILLIN >=32 RESISTANT Resistant     CEFAZOLIN >=64 RESISTANT Resistant     CEFEPIME 1 SENSITIVE Sensitive     CEFTAZIDIME <=1 SENSITIVE Sensitive     CEFTRIAXONE <=0.25 SENSITIVE Sensitive     CIPROFLOXACIN <=0.25 SENSITIVE Sensitive     GENTAMICIN <=1 SENSITIVE Sensitive     IMIPENEM 1 SENSITIVE  Sensitive     TRIMETH/SULFA <=20 SENSITIVE Sensitive     AMPICILLIN/SULBACTAM 16 INTERMEDIATE Intermediate     PIP/TAZO >=128 RESISTANT Resistant     * ABUNDANT PROVIDENCIA RETTGERI  C Difficile Quick Screen w PCR reflex     Status: None   Collection Time: 06/25/20 11:35 AM   Specimen: STOOL  Result Value Ref Range Status   C Diff antigen NEGATIVE NEGATIVE Final   C Diff toxin NEGATIVE NEGATIVE Final   C Diff interpretation No C. difficile detected.  Final    Comment: Performed at [facility], [place] [address], [place], [place] [address]  Surgical pcr screen     Status: None   Collection Time: 06/28/20  6:30 PM   Specimen: Nasal Mucosa; Nasal Swab  Result Value Ref Range Status   MRSA, PCR NEGATIVE NEGATIVE Final   Staphylococcus aureus NEGATIVE NEGATIVE Final    Comment: (NOTE) The Xpert SA Assay (FDA approved for  NASAL specimens in patients 29 years of age and older), is one component of a comprehensive surveillance program. It is not intended to diagnose infection nor to guide or monitor treatment. Performed at [facility], [place] [address], [place], [place] [address]       Radiology Studies: VAS [place] ABI WITH/WO TBI  Result Date: 06/29/2020 LOWER EXTREMITY DOPPLER STUDY Indications: Peripheral artery disease, and S/P LLE 2nd toe amputation -              evaluate blood flow. High Risk Factors: Past history of smoking. Other Factors: COPD, HX throat CA (s/p tracheostomy & PEG tube placement for                dysphagia).  Comparison Study: No previous exams Performing Technologist: Hill, Jody RVT, RDMS  Examination Guidelines: A complete evaluation includes at minimum, Doppler waveform signals and systolic blood pressure reading at the level of bilateral brachial, anterior tibial, and posterior tibial arteries, when vessel segments are accessible. Bilateral testing is considered an integral part of a complete examination. Photoelectric  Plethysmograph (PPG) waveforms and toe systolic pressure readings are included as required and additional duplex testing as needed. Limited examinations for reoccurring indications may be performed as noted.  ABI Findings: +--------+------------------+-----+----------+--------+ Right   Rt Pressure (mmHg)IndexWaveform  Comment  +--------+------------------+-----+----------+--------+ Brachial90                     biphasic           +--------+------------------+-----+----------+--------+ PTA     56                0.62 monophasic         +--------+------------------+-----+----------+--------+ DP      54                0.60 monophasic         +--------+------------------+-----+----------+--------+ +--------+------------------+-----+----------+---------------+ Left    Lt Pressure (mmHg)IndexWaveform  Comment         +--------+------------------+-----+----------+---------------+ Brachial                       biphasic  IV in upper arm +--------+------------------+-----+----------+---------------+ PTA     68                0.76 monophasic                +--------+------------------+-----+----------+---------------+ DP      61                0.68 monophasic                +--------+------------------+-----+----------+---------------+  Summary: Right: Resting right ankle-brachial index indicates moderate right lower extremity arterial disease. Left: Resting left ankle-brachial index indicates moderate left lower extremity arterial disease.  *See table(s) above for measurements and observations.  Electronically signed by Jamelle Haring on 06/29/2020 at 5:06:02 PM.   Final       Scheduled Meds: . sodium chloride   Intravenous Once  . arformoterol  15 mcg Nebulization BID  . budesonide  0.5 mg Nebulization BID  . feeding supplement (OSMOLITE 1.5 CAL)  474 mL Per Tube QID  . feeding supplement (PROSource TF)  45 mL Per Tube Daily  . free water  240 mL Per Tube BID  .  guaiFENesin  15 mL Per Tube Q6H  . levofloxacin  750 mg Per Tube Daily  . loratadine  10 mg Per Tube Daily  . methadone  120 mg Per Tube Daily  . pantoprazole sodium  40 mg Per Tube Daily  . revefenacin  175 mcg Nebulization Daily   Continuous Infusions: . dextrose 5 % and 0.9% NaCl 10 mL/hr at 06/29/20 0552     LOS: 7 days      Time spent: 25 minutes   Dessa Phi, DO [facility] 07/01/2020, 3:29 PM   Available via Epic secure chat 7am-7pm After these hours, please refer to coverage provider listed on [email or web address]

## 2020-07-01 NOTE — Evaluation (Signed)
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 Patient noted to be hypotensive with systolic blood pressures in the 80s, tachycardic with a heart rate of 105, sats of 83% on 10 L O2 via trach collar. Patient given some saline nebs with some improvement. Patient also noted to have a leukocytosis. Chest x-ray done concerning for multifocal pneumonia. Patient received a dose of IV Solu-Medrol, placed empirically on IV antibiotics and hospitalist called for admission.  Pt also s/p left second toe amputation due to left second digit osteomyelitis on 06/29/20.   Clinical Impression   Pt admitted with the above diagnoses and presents with below problem list. Pt will benefit from continued acute OT to address the below listed deficits and maximize independence with basic ADLs prior to d/c to venue below. At baseline pt is independent to mod I with basic ADLs. Pt currently needs setup assistance with UB ADLs, min A with LB ADLs. Daughter present throughout session.      Follow Up Recommendations  SNF    Equipment Recommendations  Other (comment) (defer to next venue)    Recommendations for Other Services       Precautions / Restrictions Precautions Precaution Comments: trach stoma, currently on medical air trach collar but monitor sats Required Braces or Orthoses: Other Brace Other Brace: post op shoe for left second toe amputation Restrictions Weight Bearing Restrictions: No      Mobility Bed Mobility Overal bed mobility: Needs Assistance Bed Mobility: Supine to Sit     Supine to sit:  Min assist     General bed mobility comments: up in recliner at start and end of session    Transfers Overall transfer level: Needs assistance Equipment used: Rolling walker (2 wheeled) Transfers: Sit to/from Stand Sit to Stand: Min assist;From elevated surface         General transfer comment: assist to rise and steady, cues for hand placement and weight shifting    Balance Overall balance assessment: Needs assistance;History of Falls         Standing balance support: Bilateral upper extremity supported Standing balance-Leahy Scale: Poor Standing balance comment: reliant on UE support                           ADL either performed or assessed with clinical judgement   ADL Overall ADL's : Needs assistance/impaired     Grooming: Set up;Sitting   Upper Body Bathing: Set up;Sitting   Lower Body Bathing: Sit to/from stand;Minimal assistance   Upper Body Dressing : Set up;Sitting   Lower Body Dressing: Sit to/from stand;Minimal assistance   Toilet Transfer: Min guard;Ambulation;RW;Minimal assistance   Toileting- Clothing Manipulation and Hygiene: Set up;Min guard;Sitting/lateral lean;Sit to/from stand   Tub/ Shower Transfer: Min guard;Ambulation;Rolling walker;3 in 1   Functional mobility during ADLs: Min guard;Rolling walker General ADL Comments: Pt completed functional mobility into the halls a bit at min guard level utilizing rw. Needed 1 brief standing rest break at mid point. Extra time and effort.     Vision         Perception     Praxis      Pertinent Vitals/Pain Pain Assessment: Faces Faces Pain  Scale: Hurts little more Pain Location: left foot Pain Descriptors / Indicators: Aching;Sore;Grimacing Pain Intervention(s): Limited activity within patient's tolerance;Monitored during session;Repositioned     Hand Dominance     Extremity/Trunk Assessment     Lower Extremity Assessment Lower Extremity Assessment: Generalized weakness    Cervical / Trunk Assessment Cervical / Trunk Assessment: Other exceptions Cervical / Trunk Exceptions: cachexia appearance   Communication Communication Communication: Other (comment) (trach stoma)   Cognition Arousal/Alertness: Awake/alert Behavior During Therapy: WFL for tasks assessed/performed Overall Cognitive Status: Within Functional Limits for tasks assessed                                     General Comments  On RA for mobility, O2 87 after walking. With a few minutes seated rest break O2 92. Left on RA. Nursing notified. Daughter present througout session    Exercises     Shoulder Instructions      Home Living Family/patient expects to be discharged to:: Private residence Living Arrangements: Children (daughter) Available Help at Discharge: Family Type of Home: House Home Access: Stairs to enter     Home Layout: Two level;Bed/bath upstairs Alternate Level Stairs-Number of Steps: flight   Bathroom Shower/Tub: Occupational psychologist: Handicapped height     Home Equipment: None   Additional Comments: lives with daughter      Prior Functioning/Environment Level of Independence: Independent (mod I - extra time and effort)        Comments: lives with daughter, she reports working 50 hrs/week and cannot assist; pt has recently been mainly staying in his room upstairs (no downstairs bedrooms)        OT Problem List: Decreased strength;Decreased activity tolerance;Impaired balance (sitting and/or standing);Decreased knowledge of use of DME or AE;Decreased knowledge of precautions;Cardiopulmonary status limiting activity;Pain      OT Treatment/Interventions: Self-care/ADL training;Therapeutic exercise;DME and/or AE instruction;Energy conservation;Therapeutic activities;Patient/family education;Balance training    OT Goals(Current goals can be found in the care plan section) Acute Rehab OT Goals Patient Stated Goal: ST SNF for rehab  then home OT Goal Formulation: With patient/family Time For Goal Achievement: 07/15/20 Potential to Achieve Goals: Good ADL Goals Pt Will Perform Lower Body Bathing: with modified independence;sit to/from stand Pt Will Perform Lower Body Dressing: with modified independence;sit to/from stand Pt Will Transfer to Toilet: with modified independence;ambulating Pt Will Perform Toileting - Clothing Manipulation and hygiene: with modified independence;sit to/from stand Pt Will Perform Tub/Shower Transfer: with modified independence;ambulating;3 in 1;rolling walker  OT Frequency: Min 2X/week   Barriers to D/C:            Co-evaluation              AM-PAC OT "6 Clicks" Daily Activity     Outcome Measure Help from another person eating meals?: Total (PEG) Help from another person taking care of personal grooming?: A Little Help from another person toileting, which includes using toliet, bedpan, or urinal?: A Little Help from another person bathing (including washing, rinsing, drying)?: A Little Help from another person to put on and taking off regular upper body clothing?: None Help from another person to put on and taking off regular lower body clothing?: A Little 6 Click Score: 17   End of Session Equipment Utilized During Treatment: Rolling walker Nurse Communication: Other (comment) (O2 sats on RA)  Activity Tolerance: Patient limited by fatigue;Patient tolerated treatment well Patient left: in  chair;with call bell/phone within reach;with family/visitor present  OT Visit Diagnosis: Unsteadiness on feet (R26.81);Muscle weakness (generalized) (M62.81);Pain                Time: 5916-3846 OT Time Calculation (min): 19 min Charges:  OT General Charges $OT Visit: 1 Visit OT Evaluation $OT Eval Low Complexity: [place], OT Acute Rehabilitation Services Pager: [phone] Office: [phone]   Hortencia Pilar 07/01/2020, 2:00 PM

## 2020-07-01 NOTE — Evaluation (Signed)
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 Patient noted to be hypotensive with systolic blood pressures in the 80s, tachycardic with a heart rate of 105, sats of 83% on 10 L O2 via trach collar. Patient given some saline nebs with some improvement. Patient also noted to have a leukocytosis. Chest x-ray done concerning for multifocal pneumonia. Patient received a dose of IV Solu-Medrol, placed empirically on IV antibiotics and hospitalist called for admission.  Pt also s/p left second toe amputation due to left second digit osteomyelitis on 06/29/20.  Clinical Impression  Pt admitted with above diagnosis.  Pt currently with functional limitations due to the deficits listed below (see PT Problem List). Pt will benefit from skilled PT to increase their independence and safety with mobility to allow discharge to the venue listed below.  Pt mostly in his bedroom upstairs prior to admission.  Pt fatigues very quickly and presents with generalized weakness.  Pt also s/p left second toe amputation with post op shoe in place for mobility.  Pt reports dyspnea at rest and with activity today and currently on medical air trach collar.  Pt and daughter appear agreeable to SNF so pt can become more independent prior to return home.  Pt has to perform a flight to bedroom (no bedrooms downstairs) and daughter works 89 hrs/week.       Follow Up Recommendations SNF    Equipment Recommendations  Rolling walker with 5" wheels    Recommendations for Other Services       Precautions / Restrictions  Precautions Precaution Comments: trach stoma, currently on medical air trach collar but monitor sats Required Braces or Orthoses: Other Brace Other Brace: post op shoe for left second toe amputation      Mobility  Bed Mobility Overal bed mobility: Needs Assistance Bed Mobility: Supine to Sit     Supine to sit: Min assist     General bed mobility comments: assist for trunk upright    Transfers Overall transfer level: Needs assistance Equipment used: Rolling walker (2 wheeled) Transfers: Sit to/from Stand Sit to Stand: Min assist;From elevated surface         General transfer comment: assist to rise and steady, cues for hand placement and weight shifting  Ambulation/Gait Ambulation/Gait assistance: Min assist;Min guard Gait Distance (Feet): 20 Feet Assistive device: Rolling walker (2 wheeled) Gait Pattern/deviations: Step-through pattern;Decreased stride length     General Gait Details: verbal cues for weight through UEs, RW positioning, distance limited by SOB and fatigue, SpO2 88% on room air upon return to recliner, reapplied medical air trach collar and pt returned to 91% (was 91% on arrival to room with trach collar in place)  Stairs            Wheelchair Mobility    Modified Rankin (Stroke Patients Only)       Balance Overall balance assessment: Needs assistance;History of Falls         Standing balance support: Bilateral upper extremity supported Standing balance-Leahy Scale: Poor Standing balance comment: reliant on UE support  Pertinent Vitals/Pain Pain Assessment: Faces Faces Pain Scale: Hurts little more Pain Location: left foot Pain Descriptors / Indicators: Aching;Sore;Grimacing Pain Intervention(s): Repositioned;Monitored during session (elevated)    Home Living Family/patient expects to be discharged to:: Private residence Living Arrangements: Children (daughter) Available Help at Discharge:  Family Type of Home: House       Home Layout: Two level;Bed/bath upstairs Home Equipment: None      Prior Function Level of Independence: Independent         Comments: lives with daughter, she reports working 50 hrs/week and cannot assist; pt has recently been mainly staying in his room upstairs (no downstairs bedrooms)     Hand Dominance        Extremity/Trunk Assessment        Lower Extremity Assessment Lower Extremity Assessment: Generalized weakness    Cervical / Trunk Assessment Cervical / Trunk Assessment: Other exceptions Cervical / Trunk Exceptions: cachexia appearance  Communication   Communication: Other (comment) (trach stoma)  Cognition Arousal/Alertness: Awake/alert Behavior During Therapy: WFL for tasks assessed/performed Overall Cognitive Status: Within Functional Limits for tasks assessed                                        General Comments      Exercises     Assessment/Plan    PT Assessment Patient needs continued PT services  PT Problem List Decreased strength;Decreased mobility;Decreased activity tolerance;Decreased balance;Decreased knowledge of use of DME       PT Treatment Interventions DME instruction;Therapeutic exercise;Gait training;Balance training;Functional mobility training;Therapeutic activities;Patient/family education    PT Goals (Current goals can be found in the Care Plan section)  Acute Rehab PT Goals PT Goal Formulation: With patient Time For Goal Achievement: 07/15/20 Potential to Achieve Goals: Good    Frequency Min 2X/week   Barriers to discharge        Co-evaluation               AM-PAC PT "6 Clicks" Mobility  Outcome Measure Help needed turning from your back to your side while in a flat bed without using bedrails?: A Little Help needed moving from lying on your back to sitting on the side of a flat bed without using bedrails?: A Little Help needed moving to and from a bed to  a chair (including a wheelchair)?: A Little Help needed standing up from a chair using your arms (e.g., wheelchair or bedside chair)?: A Little Help needed to walk in hospital room?: A Little Help needed climbing 3-5 steps with a railing? : A Lot 6 Click Score: 17    End of Session Equipment Utilized During Treatment: Gait belt Activity Tolerance: Patient limited by fatigue Patient left: in chair;with call bell/phone within reach;with family/visitor present Nurse Communication: Mobility status PT Visit Diagnosis: Difficulty in walking, not elsewhere classified (R26.2);Muscle weakness (generalized) (M62.81);Adult, failure to thrive (R62.7)    Time: 1040-1100 PT Time Calculation (min) (ACUTE ONLY): 20 min   Charges:   PT Evaluation $PT Eval Moderate Complexity: 1 Mod        Kati PT, DPT Acute Rehabilitation Services Pager: [phone] Office: [phone]  York Ram E 07/01/2020, 1:05 PM

## 2020-07-02 DIAGNOSIS — R6520 Severe sepsis without septic shock: Secondary | ICD-10-CM | POA: Diagnosis not present

## 2020-07-02 DIAGNOSIS — A419 Sepsis, unspecified organism: Secondary | ICD-10-CM | POA: Diagnosis not present

## 2020-07-02 LAB — GLUCOSE, CAPILLARY
Glucose-Capillary: 101 mg/dL — ABNORMAL HIGH (ref 70–99)
Glucose-Capillary: 112 mg/dL — ABNORMAL HIGH (ref 70–99)
Glucose-Capillary: 149 mg/dL — ABNORMAL HIGH (ref 70–99)
Glucose-Capillary: 65 mg/dL — ABNORMAL LOW (ref 70–99)
Glucose-Capillary: 83 mg/dL (ref 70–99)
Glucose-Capillary: 84 mg/dL (ref 70–99)

## 2020-07-02 NOTE — TOC Progression Note (Signed)
Transition of Care [facility]) - Progression Note    Patient Details  Name: Collyn Ribas MRN: [id number] Date of Birth: 1955/09/24  Transition of Care [facility]) CM/SW [place], LCSW Phone Number: 07/02/2020, 2:50 PM  Clinical Narrative:    FL2 completed. Patient faxed out. No bed offers at this time.  Patient updated, [facility] insurance is in network with certain contracted SNF's. There are no contracted SNF's in the [place] area. CSW made referral to contracted SNF's close to [place] and beyond. Patient requested CSW update his daughter Amado Nash. Sallie updated.   Expected Discharge Plan: Skilled Nursing Facility Barriers to Discharge: No SNF bed  Expected Discharge Plan and Services Expected Discharge Plan: [place] In-house Referral: Clinical Social Work Discharge Planning Services: CM Consult Post Acute Care Choice: [place] arrangements for the past 2 months: Single Family Home                   DME Agency: [employer]       HH Arranged: PT,OT,RN,Nurse's Aide,Social Work [place]: [facility] [facility] [employer] Date [place]: 06/25/20 Time [place]: [place] ([place]) Interventions    Readmission Risk Interventions Readmission Risk Prevention Plan 07/04/2019  Transportation Screening Complete  Medication Review Press photographer) Complete  PCP or Specialist appointment within 3-5 days of discharge Complete  HRI or [place] Complete  SW Recovery Care/Counseling Consult Complete  [place] Not Applicable  Some recent data might be hidden

## 2020-07-02 NOTE — Progress Notes (Signed)
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 Patient noted to be hypotensive with systolic blood pressures in the 80s, tachycardic with a heart rate of 105, sats of 83% on 10 L O2 via trach collar. Patient given some saline nebs with some improvement. Patient also noted to have a leukocytosis. Chest x-ray done concerning for multifocal pneumonia. Patient received a dose of IV Solu-Medrol, placed empirically on IV antibiotics and hospitalist called for admission.   New events last 24 hours / Subjective: This morning remains on FiO2 21% with humidified air.  He denies any worsening shortness of breath.  Has no complaints overall.  Assessment & Plan:   Principal Problem:   Severe sepsis ([place]) Active Problems:   Malignant neoplasm of glottis (HCC)   Protein-calorie malnutrition, severe   Dysphagia   COPD (chronic obstructive pulmonary disease) (HCC)   Normocytic anemia   Chronic pulmonary aspiration   Acute respiratory failure with hypoxia ([place])   Community acquired pneumonia   Diarrhea   Abnormally small toe   Hypernatremia   Closed nondisplaced fracture of lesser toe of left foot with malunion   Severe sepsis and acute respiratory failure with hypoxia secondary to multifocal pneumonia -Patient noted to be hypoxic on admission with sats of 83% on 10 L O2 via trach collar on arrival. Patient also with a leukocytosis, elevated lactic acid level, tachycardic with heart rate up to 105, chest x-ray with multifocal pneumonia. SARS coronavirus PCR negative. Urine strep pneumococcus antigen  negative, urine Legionella antigen negative. Sputum Gram stain and cultures with abundant procidentia rettgeri/moderate Klebsiella pneumonia which is resistant to ampicillin and cefazolin but sensitive to cefepime, ceftazidime, Rocephin, ciprofloxacin, gentamicin, Bactrim.  -IV Rocephin and oral doxycycline have been discontinued and patient started on Levaquin to complete a 10-day course of antibiotic treatment. Dr. Grandville Silos discussed with ID, Dr. Johnnye Sima about antibiotic treatment.  Patient also seen by pulmonary during this hospitalization.  Outpatient follow-up with pulmonary. -Now on room air, trach collar  COPD -Continue Pulmicort nebs, Brovana, yupelri as recommended by pulmonary.  Pulmonary hygiene.   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 Continue stoma and PEG tube care. Dietitian consulted.  Severe protein calorie malnutrition -Secondary to chronic illness. Dietitian consulted and following.  Continue current tube feeds.    Anemia of chronic disease/iron deficiency anemia -Patient with no overt bleeding, hemoglobin trickling down likely due to dilutional effect. Anemia panel consistent with anemia of chronic disease/iron deficiency. Iron level at 13. Status post transfusion 1 unit packed red blood cells, IV Feraheme x1.  Will likely need oral iron supplementation on discharge.   Abnormality of left second toe/left second toe commuted fracture/osteomyelitis of second left toe -Left second toe dark with some thickening of the skin and tender to palpation.  Plain films done consistent with a commuted fracture involving distal portion of the second proximal phalanx with severe dislocation of the second middle phalanx relative to the second proximal phalanx.  CRP elevated at 9.2.  Sed rate elevated at 70.   Patient was on IV antibiotics  has been transitioned to oral Levaquin.  Podiatry consulted and they assessed the patient and performed closed reduction of the left second toe with splinting.  Surgical shoe ordered.  Podiatry reviewed films of the foot again and due to concerns for probable osteomyelitis and patient subsequently underwent toe amputation 06/29/2020 without any complications.  Per podiatry clean margins with no further osteomyelitis noted and amputation has treated osteomyelitis no extended antibiotics needed for osteomyelitis.  Outpatient follow-up with podiatry.  Diarrhea -Patient with multiple loose stools per patient and per RN early on in the hospitalization which has since resolved.  C. difficile PCR negative.  WBC normalized.  Rectal tube has been discontinued.  Chronic pain -Continue methadone     DVT prophylaxis:  Place and maintain sequential compression device Start: 06/30/20 2706  Code Status: Full code Family Communication: No family at bedside Disposition Plan:  Status is: Inpatient  Remains inpatient appropriate because:Unsafe d/c plan   Dispo: The patient is from: Home              Anticipated d/c is to: SNF              Anticipated d/c date is: 1 day              Patient currently is medically stable to d/c.  Awaiting SNF placement   Difficult to place patient No   Antimicrobials:  Anti-infectives (From admission, onward)   Start     Dose/Rate Route Frequency Ordered Stop   06/27/20 1700  levofloxacin (LEVAQUIN) 25 MG/ML solution 750 mg        750 mg Per Tube Daily 06/27/20 1437     06/27/20 1230  cefdinir (OMNICEF) 125 MG/5ML suspension 300 mg  Status:  Discontinued        300 mg Per Tube 2 times daily 06/27/20 1140 06/27/20 1437   06/23/20 2200  cefTRIAXone (ROCEPHIN) 2 g in sodium chloride 0.9 % 100 mL IVPB  Status:  Discontinued        2 g 200 mL/hr over 30 Minutes Intravenous Every 24 hours 06/23/20 1926 06/27/20 1140   06/23/20 2200   doxycycline (VIBRA-TABS) tablet 100 mg  Status:  Discontinued        100 mg Per Tube Every 12 hours 06/23/20 1941 06/27/20 1437   06/23/20 2000  azithromycin (ZITHROMAX) 500 mg in sodium chloride 0.9 % 250 mL IVPB  Status:  Discontinued        500 mg 250 mL/hr over 60 Minutes Intravenous Every 24 hours 06/23/20 1926 06/23/20 1938   06/23/20 1845  vancomycin (VANCOREADY) IVPB 1250 mg/250 mL        1,250 mg 166.7 mL/hr over 90 Minutes Intravenous  Once 06/23/20 1839 06/23/20 2055   06/23/20 1830  ceFEPIme (MAXIPIME) 2 g in sodium chloride 0.9 % 100 mL IVPB        2 g 200 mL/hr over 30 Minutes Intravenous  Once 06/23/20 1815 06/23/20 1920       Objective: Vitals:   07/02/20 0551 07/02/20 0600 07/02/20 0758 07/02/20 1100  BP: 113/65   102/63  Pulse: 62   72  Resp: 17   13  Temp: 97.9 F (36.6 C)   97.6 F (36.4 C)  TempSrc: Oral   Oral  SpO2: 98%  95% 95%  Weight:  51.1 kg    Height:        Intake/Output Summary (Last 24 hours) at 07/02/2020 1155 Last data filed at 07/02/2020 1150 Gross per 24  hour  Intake 1559 ml  Output 1830 ml  Net -271 ml   Filed Weights   06/30/20 2206 07/01/20 0500 07/02/20 0600  Weight: 53.6 kg 52.2 kg 51.1 kg    Examination: General exam: Appears calm and comfortable  Respiratory system: Clear to auscultation. Respiratory effort normal.  Trach collar in place Cardiovascular system: S1 & S2 heard, RRR. No pedal edema. Gastrointestinal system: Abdomen is nondistended, soft and nontender. Normal bowel sounds heard. Central nervous system: Alert and oriented. Non focal exam. Speech clear  Psychiatry: Judgement and insight appear stable. Mood & affect appropriate.     Data Reviewed: I have personally reviewed following labs and imaging studies  CBC: Recent Labs  Lab 06/26/20 0322 06/27/20 0318 06/28/20 0325 06/29/20 0300 06/30/20 0325 07/01/20 0339  WBC 8.2 7.1 6.1 7.2 6.7 6.2  NEUTROABS 7.1 5.6 4.6 5.8  --  4.5  HGB 8.3* 7.4* 7.3* 9.2*  8.5* 9.3*  HCT 28.0* 25.1* 23.8* 30.8* 27.8* 30.2*  MCV 89.5 89.6 87.2 89.0 87.7 87.8  PLT 285 232 212 232 242 458   Basic Metabolic Panel: Recent Labs  Lab 06/25/20 1652 06/25/20 1652 06/26/20 0322 06/27/20 0318 06/28/20 0325 06/29/20 0300 06/30/20 0325 07/01/20 0339  NA  --    < > 148* 136 139 140 138 142  K  --    < > 3.0* 4.1 4.0 4.3 4.2 4.5  CL  --    < > 110 105 101 98 99 102  CO2  --    < > 28 26 30  34* 32 33*  GLUCOSE  --    < > 96 99 93 78 78 73  BUN  --    < > 24* 20 25* 25* 25* 31*  CREATININE  --    < > 0.70 0.57* 0.55* 0.65 0.64 0.70  CALCIUM  --    < > 8.3* 7.8* 8.0* 8.4* 8.3* 8.5*  MG 2.0  --  2.1 1.9  --  2.0 2.0 2.4  PHOS 1.9*  --  2.7  --   --   --   --   --    < > = values in this interval not displayed.   GFR: Estimated Creatinine Clearance: 67.4 mL/min (by C-G formula based on SCr of 0.7 mg/dL). Liver Function Tests: No results for input(s): AST, ALT, ALKPHOS, BILITOT, PROT, ALBUMIN in the last 168 hours. No results for input(s): LIPASE, AMYLASE in the last 168 hours. No results for input(s): AMMONIA in the last 168 hours. Coagulation Profile: No results for input(s): INR, PROTIME in the last 168 hours. Cardiac Enzymes: No results for input(s): CKTOTAL, CKMB, CKMBINDEX, TROPONINI in the last 168 hours. BNP (last 3 results) No results for input(s): PROBNP in the last 8760 hours. HbA1C: No results for input(s): HGBA1C in the last 72 hours. CBG: Recent Labs  Lab 07/01/20 1757 07/02/20 0014 07/02/20 0628 07/02/20 0725 07/02/20 1148  GLUCAP 104* 101* 65* 84 83   Lipid Profile: No results for input(s): CHOL, HDL, LDLCALC, TRIG, CHOLHDL, LDLDIRECT in the last 72 hours. Thyroid Function Tests: No results for input(s): TSH, T4TOTAL, FREET4, T3FREE, THYROIDAB in the last 72 hours. Anemia Panel: No results for input(s): VITAMINB12, FOLATE, FERRITIN, TIBC, IRON, RETICCTPCT in the last 72 hours. Sepsis Labs: No results for input(s): PROCALCITON,  LATICACIDVEN in the last 168 hours.  Recent Results (from the past 240 hour(s))  SARS Coronavirus 2 by RT PCR (hospital order, performed in [facility] hospital lab) Nasopharyngeal Nasopharyngeal  Swab     Status: None   Collection Time: 06/23/20  3:10 PM   Specimen: Nasopharyngeal Swab  Result Value Ref Range Status   SARS Coronavirus 2 NEGATIVE NEGATIVE Final    Comment: (NOTE) SARS-CoV-2 target nucleic acids are NOT DETECTED.  The SARS-CoV-2 RNA is generally detectable in upper and lower respiratory specimens during the acute phase of infection. The lowest concentration of SARS-CoV-2 viral copies this assay can detect is 250 copies / mL. A negative result does not preclude SARS-CoV-2 infection and should not be used as the sole basis for treatment or other patient management decisions.  A negative result may occur with improper specimen collection / handling, submission of specimen other than nasopharyngeal swab, presence of viral mutation(s) within the areas targeted by this assay, and inadequate number of viral copies (<250 copies / mL). A negative result must be combined with clinical observations, patient history, and epidemiological information.  Fact Sheet for Patients:   StrictlyIdeas.no  Fact Sheet for Healthcare Providers: BankingDealers.co.za  This test is not yet approved or  cleared by the [place] FDA and has been authorized for detection and/or diagnosis of SARS-CoV-2 by FDA under an Emergency Use Authorization (EUA).  This EUA will remain in effect (meaning this test can be used) for the duration of the COVID-19 declaration under Section 564(b)(1) of the Act, 21 U.S.C. section 360bbb-3(b)(1), unless the authorization is terminated or revoked sooner.  Performed at [facility], [place] [address] [place], [place] [address]   Culture, blood (routine x 2)     Status: None   Collection Time:  06/23/20  6:13 PM   Specimen: BLOOD  Result Value Ref Range Status   Specimen Description   Final    BLOOD LEFT ANTECUBITAL Performed at [place] [address], [place], [place] [address]    Special Requests   Final    BOTTLES DRAWN AEROBIC AND ANAEROBIC Blood Culture results may not be optimal due to an inadequate volume of blood received in culture bottles Performed at [place] [address] [place], [place] [address]    Culture   Final    NO GROWTH 5 DAYS Performed at [facility], [place] [address], [place], [place] [address]    Report Status 06/28/2020 FINAL  Final  Culture, blood (routine x 2)     Status: None   Collection Time: 06/23/20  6:38 PM   Specimen: BLOOD  Result Value Ref Range Status   Specimen Description   Final    BLOOD RIGHT ANTECUBITAL Performed at [place] [address], [place], [place] [address]    Special Requests   Final    BOTTLES DRAWN AEROBIC AND ANAEROBIC Blood Culture results may not be optimal due to an inadequate volume of blood received in culture bottles Performed at [place] [address], [place], [place] [address]    Culture   Final    NO GROWTH 5 DAYS Performed at [facility], [place] [address] [place], [place] [address]    Report Status 06/28/2020 FINAL  Final  Culture, sputum-assessment     Status: None   Collection Time: 06/23/20  7:24 PM   Specimen: Expectorated Sputum  Result Value Ref Range Status   Specimen Description EXPECTORATED SPUTUM  Final   Special Requests NONE  Final   Sputum evaluation   Final    THIS SPECIMEN IS ACCEPTABLE FOR SPUTUM CULTURE Performed  at [facility], [place] [address], [place], [place] [address]    Report Status 06/23/2020 FINAL  Final  Culture, respiratory     Status: None   Collection Time: 06/23/20  7:24 PM  Result Value Ref Range Status   Specimen Description   Final     EXPECTORATED SPUTUM Performed at [place] [address], [place], [place] [address]    Special Requests   Final    NONE Reflexed from [id number] Performed at [place] [address], [place], [place] [address]    Gram Stain   Final    ABUNDANT WBC PRESENT, PREDOMINANTLY PMN ABUNDANT GRAM NEGATIVE RODS MODERATE GRAM POSITIVE RODS FEW GRAM POSITIVE COCCI    Culture   Final    ABUNDANT PROVIDENCIA RETTGERI MODERATE KLEBSIELLA PNEUMONIAE WITHIN NORMAL RESPIRATORY FLORA Performed at [facility], [place] [address] [place], [place] [address]    Report Status 06/27/2020 FINAL  Final   Organism ID, Bacteria PROVIDENCIA RETTGERI  Final   Organism ID, Bacteria KLEBSIELLA PNEUMONIAE  Final      Susceptibility   Klebsiella pneumoniae - MIC*    AMPICILLIN >=32 RESISTANT Resistant     CEFAZOLIN <=4 SENSITIVE Sensitive     CEFEPIME <=0.12 SENSITIVE Sensitive     CEFTAZIDIME <=1 SENSITIVE Sensitive     CEFTRIAXONE <=0.25 SENSITIVE Sensitive     CIPROFLOXACIN <=0.25 SENSITIVE Sensitive     GENTAMICIN <=1 SENSITIVE Sensitive     IMIPENEM <=0.25 SENSITIVE Sensitive     TRIMETH/SULFA <=20 SENSITIVE Sensitive     AMPICILLIN/SULBACTAM 4 SENSITIVE Sensitive     PIP/TAZO <=4 SENSITIVE Sensitive     * MODERATE KLEBSIELLA PNEUMONIAE   Providencia rettgeri - MIC*    AMPICILLIN >=32 RESISTANT Resistant     CEFAZOLIN >=64 RESISTANT Resistant     CEFEPIME 1 SENSITIVE Sensitive     CEFTAZIDIME <=1 SENSITIVE Sensitive     CEFTRIAXONE <=0.25 SENSITIVE Sensitive     CIPROFLOXACIN <=0.25 SENSITIVE Sensitive     GENTAMICIN <=1 SENSITIVE Sensitive     IMIPENEM 1 SENSITIVE Sensitive     TRIMETH/SULFA <=20 SENSITIVE Sensitive     AMPICILLIN/SULBACTAM 16 INTERMEDIATE Intermediate     PIP/TAZO >=128 RESISTANT Resistant     * ABUNDANT PROVIDENCIA RETTGERI  C Difficile Quick Screen w PCR reflex     Status: None   Collection Time: 06/25/20 11:35 AM    Specimen: STOOL  Result Value Ref Range Status   C Diff antigen NEGATIVE NEGATIVE Final   C Diff toxin NEGATIVE NEGATIVE Final   C Diff interpretation No C. difficile detected.  Final    Comment: Performed at [facility], [place] [address], [place], [place] [address]  Surgical pcr screen     Status: None   Collection Time: 06/28/20  6:30 PM   Specimen: Nasal Mucosa; Nasal Swab  Result Value Ref Range Status   MRSA, PCR NEGATIVE NEGATIVE Final   Staphylococcus aureus NEGATIVE NEGATIVE Final    Comment: (NOTE) The Xpert SA Assay (FDA approved for NASAL specimens in patients 43 years of age and older), is one component of a comprehensive surveillance program. It is not intended to diagnose infection nor to guide or monitor treatment. Performed at [facility], [place] [address], [place],  [address]       Radiology Studies: No results found.    Scheduled Meds: . sodium chloride   Intravenous Once  . arformoterol  15 mcg  Nebulization BID  . budesonide  0.5 mg Nebulization BID  . feeding supplement (OSMOLITE 1.5 CAL)  474 mL Per Tube QID  . feeding supplement (PROSource TF)  45 mL Per Tube Daily  . free water  240 mL Per Tube BID  . guaiFENesin  15 mL Per Tube Q6H  . levofloxacin  750 mg Per Tube Daily  . loratadine  10 mg Per Tube Daily  . methadone  120 mg Per Tube Daily  . pantoprazole sodium  40 mg Per Tube Daily  . revefenacin  175 mcg Nebulization Daily   Continuous Infusions: . dextrose 5 % and 0.9% NaCl 10 mL/hr at 06/29/20 0552     LOS: 8 days      Time spent: 20 minutes   Dessa Phi, DO [facility] 07/02/2020, 11:55 AM   Available via Epic secure chat 7am-7pm After these hours, please refer to coverage provider listed on [email or web address]

## 2020-07-02 NOTE — NC FL2 (Signed)
[place] LEVEL OF CARE SCREENING TOOL     IDENTIFICATION  Patient Name: Ronald Ruiz Birthdate: 1955-10-08 Sex: male Admission Date (Current Location): 06/23/2020  [facility] and [place] Number:  Herbalist and Address:  [facility],  [place], [place]      Provider Number: [id number]  Attending Physician Name and Address:  Dessa Phi, DO  Relative Name and Phone Number:       Current Level of Care: Hospital Recommended Level of Care: [place] Prior Approval Number:    Date Approved/Denied:   PASRR Number:   [id number]  Discharge Plan: SNF    Current Diagnoses: Patient Active Problem List   Diagnosis Date Noted  . Hypernatremia   . Closed nondisplaced fracture of lesser toe of left foot with malunion   . Diarrhea   . Abnormally small toe   . Acute respiratory failure with hypoxia ([place]) 06/23/2020  . Community acquired pneumonia 06/23/2020  . Severe sepsis ([place]) 06/23/2020  . AKI (acute kidney injury) ([place]) 07/02/2019  . COPD (chronic obstructive pulmonary disease) ([place])   . Transaminitis   . Normocytic anemia   . Chronic pulmonary aspiration   . History of throat cancer   . Aspiration into airway 06/18/2019  . Dehydration 12/03/2018  . Counseling regarding advance care planning and goals of care 08/23/2018  . COPD with chronic bronchitis ([place]) 07/31/2018  . Dysphagia 07/31/2018  . Laryngeal mass 07/31/2018  . Substance abuse ([place]) 07/31/2018  . Protein-calorie malnutrition, severe 07/23/2018  . Malignant neoplasm of glottis ([place]) 07/21/2018    Orientation RESPIRATION BLADDER Height & Weight     Self,Time,Situation,Place  Tracheostomy  (decannulated, Room Air) Continent Weight: 112 lb 10.5 oz (51.1 kg) Height:  6' (182.9 cm)  BEHAVIORAL SYMPTOMS/MOOD NEUROLOGICAL BOWEL NUTRITION STATUS      Continent Feeding tube (2 cartons (474 ml) Osmolite 1.5 QID with 45 ml Prosource TF (or  equivalent) once/day and 60 ml free water before and after each TF bolus. 240 ml free water BID to provide total water/day of 2408 ml.)  AMBULATORY STATUS COMMUNICATION OF NEEDS Skin   Extensive Assist Verbally Skin abrasions                       Personal Care Assistance Level of Assistance  Bathing,Feeding,Dressing Bathing Assistance: Limited assistance Feeding assistance: Independent Dressing Assistance: Limited assistance     Functional Limitations Info  Sight,Hearing,Speech Sight Info: Adequate Hearing Info: Adequate Speech Info: Impaired    SPECIAL CARE FACTORS FREQUENCY  PT (By licensed PT),OT (By licensed OT)     PT Frequency: 5x/week OT Frequency: 5x/week            Contractures Contractures Info: Present    Additional Factors Info  Code Status,Allergies,Psychotropic Code Status Info: Fullcode Allergies Info: Allergies: No Known Allergies           Current Medications (07/02/2020):  This is the current hospital active medication list Current Facility-Administered Medications  Medication Dose Route Frequency Provider Last Rate Last Admin  . 0.9 %  sodium chloride infusion (Manually program via Guardrails IV Fluids)   Intravenous Once [name], DPM      . acetaminophen (TYLENOL) 160 MG/5ML solution 650 mg  650 mg Per Tube Q6H PRN [name], DPM   650 mg at 07/02/20 1062   Or  . acetaminophen (TYLENOL) suppository 650 mg  650 mg Rectal Q6H PRN [name], DPM      .  albuterol (PROVENTIL) (2.5 MG/3ML) 0.083% nebulizer solution 2.5 mg  2.5 mg Nebulization Q2H PRN [name], DPM      . arformoterol (BROVANA) nebulizer solution 15 mcg  15 mcg Nebulization BID [name], DPM   15 mcg at 07/02/20 0758  . budesonide (PULMICORT) nebulizer solution 0.5 mg  0.5 mg Nebulization BID [name], DPM   0.5 mg at 07/02/20 3086  . dextrose 5 %-0.9 % sodium chloride infusion   Intravenous Continuous [name], DPM 10 mL/hr at 06/29/20 5784  Infusion Verify at 06/29/20 0552  . feeding supplement (OSMOLITE 1.5 CAL) liquid 474 mL  474 mL Per Tube QID Eugenie Filler, MD   474 mL at 07/02/20 1136  . feeding supplement (PROSource TF) liquid 45 mL  45 mL Per Tube Daily Eugenie Filler, MD   45 mL at 07/02/20 1034  . free water 240 mL  240 mL Per Tube BID Eugenie Filler, MD   240 mL at 07/02/20 1034  . guaiFENesin (ROBITUSSIN) 100 MG/5ML solution 300 mg  15 mL Per Tube Q6H [name], DPM   300 mg at 07/02/20 1136  . levofloxacin (LEVAQUIN) 25 MG/ML solution 750 mg  750 mg Per Tube Daily [name], DPM   750 mg at 07/02/20 1033  . loperamide (IMODIUM) capsule 2 mg  2 mg Oral PRN [name], DPM   2 mg at 06/26/20 0848  . loratadine (CLARITIN) tablet 10 mg  10 mg Per Tube Daily [name], DPM   10 mg at 07/02/20 1034  . methadone (DOLOPHINE) 10 MG/ML solution 120 mg  120 mg Per Tube Daily [name], DPM   120 mg at 07/02/20 1033  . ondansetron (ZOFRAN) tablet 4 mg  4 mg Oral Q6H PRN [name], DPM       Or  . ondansetron (ZOFRAN) injection 4 mg  4 mg Intravenous Q6H PRN [name], DPM      . oxyCODONE (ROXICODONE) 5 MG/5ML solution 5 mg  5 mg Per Tube Q4H PRN [name], DPM   5 mg at 07/02/20 0729  . pantoprazole sodium (PROTONIX) 40 mg/20 mL oral suspension 40 mg  40 mg Per Tube Daily [name], DPM   40 mg at 07/02/20 1033  . revefenacin (YUPELRI) nebulizer solution 175 mcg  175 mcg Nebulization Daily [name], DPM   175 mcg at 07/02/20 0805  . senna-docusate (Senokot-S) tablet 1 tablet  1 tablet Oral QHS PRN [name], DPM         Discharge Medications: Please see discharge summary for a list of discharge medications.  Relevant Imaging Results:  Relevant Lab Results:   Additional Information ONG:[id number], Vaccinated and Boosted.  Lia Hopping, LCSW

## 2020-07-03 DIAGNOSIS — R6520 Severe sepsis without septic shock: Secondary | ICD-10-CM | POA: Diagnosis not present

## 2020-07-03 DIAGNOSIS — A419 Sepsis, unspecified organism: Secondary | ICD-10-CM | POA: Diagnosis not present

## 2020-07-03 LAB — GLUCOSE, CAPILLARY
Glucose-Capillary: 123 mg/dL — ABNORMAL HIGH (ref 70–99)
Glucose-Capillary: 125 mg/dL — ABNORMAL HIGH (ref 70–99)
Glucose-Capillary: 60 mg/dL — ABNORMAL LOW (ref 70–99)
Glucose-Capillary: 62 mg/dL — ABNORMAL LOW (ref 70–99)
Glucose-Capillary: 63 mg/dL — ABNORMAL LOW (ref 70–99)
Glucose-Capillary: 84 mg/dL (ref 70–99)
Glucose-Capillary: 90 mg/dL (ref 70–99)

## 2020-07-03 NOTE — Progress Notes (Signed)
Hypoglycemic Event  CBG: 63  Treatment: tube feeding bolus given as ordered  Symptoms: none  Follow-up CBG: Time:1827 CBG Result:90  Possible Reasons for Event: poor intake  Comments/MD notified:dr choi increased ivf     Hinton Rao

## 2020-07-03 NOTE — Plan of Care (Signed)
  Problem: Health Behavior/Discharge Planning: Goal: Ability to manage health-related needs will improve Outcome: Progressing   Problem: Activity: Goal: Risk for activity intolerance will decrease Outcome: Progressing   Problem: Nutrition: Goal: Adequate nutrition will be maintained Outcome: Progressing   

## 2020-07-03 NOTE — Progress Notes (Signed)
Hypoglycemic event: Blood sugar at 1146 60, pt asymptomatic just stated he feels a little shaky. Vital signs stable. Noon bolus tube feeding given. Recheck blood sugar is 125. Notified dr Maylene Roes, orders being entered for ivf.  Pt denies any complaints except some soreness in his foot.

## 2020-07-03 NOTE — Progress Notes (Signed)
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 Patient noted to be hypotensive with systolic blood pressures in the 80s, tachycardic with a heart rate of 105, sats of 83% on 10 L O2 via trach collar. Patient given some saline nebs with some improvement. Patient also noted to have a leukocytosis. Chest x-ray done concerning for multifocal pneumonia. Patient received a dose of IV Solu-Medrol, placed empirically on IV antibiotics and hospitalist called for admission.  Pt also s/p left second toe amputation due to left second digit osteomyelitis on 06/29/20.    PT Comments    Pt very agreeable to mobilize and ambulated in hallway on 3L O2 trach collar at 28% FiO2.  Pt tolerated improved distance today compared to previous session.  Continue to recommend SNF Upon d/c.  Follow Up Recommendations  SNF     Equipment Recommendations  Rolling walker with 5" wheels    Recommendations for Other Services       Precautions / Restrictions Precautions Precaution Comments: trach stoma, currently on 28% trach collar but monitor sats Required Braces or Orthoses: Other Brace Other Brace: post op shoe for left second toe amputation    Mobility  Bed Mobility Overal bed mobility: Needs Assistance Bed Mobility: Supine to Sit     Supine to sit: Min guard     General bed mobility comments: for lines    Transfers Overall transfer level: Needs assistance Equipment used: Rolling walker (2 wheeled) Transfers: Sit to/from Stand Sit to Stand: Min assist         General transfer comment: assist to rise  and steady, cues for hand placement and weight shifting  Ambulation/Gait Ambulation/Gait assistance: Min guard Gait Distance (Feet): 120 Feet Assistive device: Rolling walker (2 wheeled) Gait Pattern/deviations: Step-through pattern;Decreased stride length     General Gait Details: verbal cues for weight through UEs, RW positioning, SpO2 91% on 3L tank 28% FiO2 upon returning to room from ambulating (was 91% on 28% FiO2 trach collar in place on arrival to room)   Stairs             Wheelchair Mobility    Modified Rankin (Stroke Patients Only)       Balance                                            Cognition Arousal/Alertness: Awake/alert Behavior During Therapy: WFL for tasks assessed/performed Overall Cognitive Status: Within Functional Limits for tasks assessed                                        Exercises      General Comments        Pertinent Vitals/Pain Pain Assessment: Faces Faces Pain Scale: Hurts a little bit Pain Location: left foot Pain Descriptors / Indicators: Aching;Sore;Grimacing Pain Intervention(s): Repositioned;Monitored during session    Home Living                      Prior Function  PT Goals (current goals can now be found in the care plan section) Progress towards PT goals: Progressing toward goals    Frequency    Min 2X/week      PT Plan Current plan remains appropriate    Co-evaluation              AM-PAC PT "6 Clicks" Mobility   Outcome Measure  Help needed turning from your back to your side while in a flat bed without using bedrails?: A Little Help needed moving from lying on your back to sitting on the side of a flat bed without using bedrails?: A Little Help needed moving to and from a bed to a chair (including a wheelchair)?: A Little Help needed standing up from a chair using your arms (e.g., wheelchair or bedside chair)?: A Little Help needed  to walk in hospital room?: A Little Help needed climbing 3-5 steps with a railing? : A Lot 6 Click Score: 17    End of Session Equipment Utilized During Treatment: Gait belt;Oxygen Activity Tolerance: Patient tolerated treatment well Patient left: in chair;with call bell/phone within reach;with family/visitor present Nurse Communication: Mobility status PT Visit Diagnosis: Difficulty in walking, not elsewhere classified (R26.2);Muscle weakness (generalized) (M62.81);Adult, failure to thrive (R62.7)     Time: 2947-6546 PT Time Calculation (min) (ACUTE ONLY): 16 min  Charges:  $Gait Training: 8-22 mins                    Jannette Spanner PT, DPT Acute Rehabilitation Services Pager: [phone] Office: [phone]  Trena Platt 07/03/2020, 4:45 PM

## 2020-07-03 NOTE — TOC Progression Note (Signed)
Transition of Care [facility]) - Progression Note    Patient Details  Name: Ronald Ruiz MRN: [id number] Date of Birth: 11-29-1955  Transition of Care [facility]) CM/SW [place], LCSW Phone Number:  07/03/2020, 12:49 PM  Clinical Narrative:    Ronald Ruiz to discharge-pt. Is currently receiving methadone for history of substance use. SNF's are reporting the facility physician cannot prescribe the methadone. Per daughter, the patient is active with the [facility] w/ Counselor Elouise Munroe. He receives a 28 day supply every visit. SNF's have not agreed to allow medications to be brought to the SNF. CSW to reach out to [facility] supervision for further guidance.   Expected Discharge Plan: Skilled Nursing Facility Barriers to Discharge: No SNF bed  Expected Discharge Plan and Services Expected Discharge Plan: [place] In-house Referral: Clinical Social Work Discharge Planning Services: CM Consult Post Acute Care Choice: [place] arrangements for the past 2 months: Single Family Home                   DME Agency: [employer]       HH Arranged: PT,OT,RN,Nurse's Aide,Social Work [place]: [facility] [facility] [employer] Date [place]: 06/25/20 Time [place]: [place] ([place]) Interventions    Readmission Risk Interventions Readmission Risk Prevention Plan 07/04/2019  Transportation Screening Complete  Medication Review Press photographer) Complete  PCP or Specialist appointment within 3-5 days of discharge Complete  HRI or [place] Complete  SW Recovery Care/Counseling Consult Complete  [place] Not Applicable  Some recent data might be hidden

## 2020-07-03 NOTE — Progress Notes (Signed)
Hypoglycemic Event  CBG: 62 (Notified by tech at 6:30 am)  Treatment: 240 cc of orange juice through peg tube at 6:35 am  Symptoms: No symptoms  Follow-up CBG: Time: 6:52 CBG Result: 84  Possible Reasons for Event: Not enough sugar. Patient also stated that blood sugars tend to increase again after receiving nutrition through his tube.   Comments/MD notified: Dessa Phi, DO    Anda Kraft

## 2020-07-03 NOTE — Progress Notes (Signed)
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 Patient noted to be hypotensive with systolic blood pressures in the 80s, tachycardic with a heart rate of 105, sats of 83% on 10 L O2 via trach collar. Patient given some saline nebs with some improvement. Patient also noted to have a leukocytosis. Chest x-ray done concerning for multifocal pneumonia. Patient received a dose of IV Solu-Medrol, placed empirically on IV antibiotics and hospitalist called for admission. Patient also underwent left second toe amputation due to osteomyelitis.   New events last 24 hours / Subjective: States that he is doing about the same.  No new complaints.  Awaiting SNF placement.  Assessment & Plan:   Principal Problem:   Severe sepsis ([place]) Active Problems:   Malignant neoplasm of glottis (HCC)   Protein-calorie malnutrition, severe   Dysphagia   COPD (chronic obstructive pulmonary disease) (HCC)   Normocytic anemia   Chronic pulmonary aspiration   Acute respiratory failure with hypoxia ([place])   Community acquired pneumonia   Diarrhea   Abnormally small toe   Hypernatremia   Closed nondisplaced fracture of lesser toe of left foot with malunion   Severe sepsis and acute respiratory failure with hypoxia secondary to multifocal pneumonia -Patient noted to be hypoxic on admission with sats of 83% on 10 L O2 via trach collar on arrival. Patient also with a leukocytosis, elevated lactic acid level, tachycardic with heart rate up to 105, chest x-ray with multifocal pneumonia. SARS coronavirus PCR negative. Urine  strep pneumococcus antigen negative, urine Legionella antigen negative. Sputum Gram stain and cultures with abundant procidentia rettgeri/moderate Klebsiella pneumonia which is resistant to ampicillin and cefazolin but sensitive to cefepime, ceftazidime, Rocephin, ciprofloxacin, gentamicin, Bactrim.  -IV Rocephin and oral doxycycline have been discontinued and patient started on Levaquin to complete a 10-day course of antibiotic treatment. Dr. Grandville Silos discussed with ID, Dr. Johnnye Sima about antibiotic treatment.  Patient also seen by pulmonary during this hospitalization.  Outpatient follow-up with pulmonary. -Now on room air, trach collar  COPD -Continue Pulmicort nebs, Brovana, yupelri as recommended by pulmonary.  Pulmonary hygiene.   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 Continue stoma and PEG tube care. Dietitian consulted.  Severe protein calorie malnutrition -Secondary to chronic illness. Dietitian consulted and following.  Continue current tube feeds.    Anemia of chronic disease/iron deficiency anemia -Patient with no overt bleeding, hemoglobin trickling down likely due to dilutional effect. Anemia panel consistent with anemia of chronic disease/iron deficiency. Iron level at 13. Status post transfusion 1 unit packed red blood cells, IV Feraheme x1.  Will likely need oral iron supplementation on discharge.   Abnormality of left second toe/left second toe commuted fracture/osteomyelitis of second left toe -Left second toe dark with some thickening of the skin and tender to palpation.  Plain films done consistent with a commuted fracture involving distal portion of the second proximal phalanx with severe dislocation of the second middle phalanx relative to the second proximal phalanx.  CRP elevated at 9.2.   Sed rate elevated at 70.  Patient  was on IV antibiotics has been transitioned to oral Levaquin.  Podiatry consulted and they assessed the patient and performed closed reduction of the left second toe with splinting.  Surgical shoe ordered.  Podiatry reviewed films of the foot again and due to concerns for probable osteomyelitis and patient subsequently underwent toe amputation 06/29/2020 without any complications.  Per podiatry clean margins with no further osteomyelitis noted and amputation has treated osteomyelitis no extended antibiotics needed for osteomyelitis.  Outpatient follow-up with podiatry.  Diarrhea -Patient with multiple loose stools per patient and per RN early on in the hospitalization which has since resolved.  C. difficile PCR negative.  WBC normalized.  Rectal tube has been discontinued.  Chronic pain -Continue methadone     DVT prophylaxis:  Place and maintain sequential compression device Start: 06/30/20 2993  Code Status: Full code Family Communication: Daughter at bedside Disposition Plan:  Status is: Inpatient  Remains inpatient appropriate because:Unsafe d/c plan   Dispo: The patient is from: Home              Anticipated d/c is to: SNF              Anticipated d/c date is: 1 day              Patient currently is medically stable to d/c.  Awaiting SNF placement   Difficult to place patient No   Antimicrobials:  Anti-infectives (From admission, onward)   Start     Dose/Rate Route Frequency Ordered Stop   06/27/20 1700  levofloxacin (LEVAQUIN) 25 MG/ML solution 750 mg  Status:  Discontinued        750 mg Per Tube Daily 06/27/20 1437 07/02/20 1336   06/27/20 1230  cefdinir (OMNICEF) 125 MG/5ML suspension 300 mg  Status:  Discontinued        300 mg Per Tube 2 times daily 06/27/20 1140 06/27/20 1437   06/23/20 2200  cefTRIAXone (ROCEPHIN) 2 g in sodium chloride 0.9 % 100 mL IVPB  Status:  Discontinued        2 g 200 mL/hr over 30 Minutes Intravenous Every 24  hours 06/23/20 1926 06/27/20 1140   06/23/20 2200  doxycycline (VIBRA-TABS) tablet 100 mg  Status:  Discontinued        100 mg Per Tube Every 12 hours 06/23/20 1941 06/27/20 1437   06/23/20 2000  azithromycin (ZITHROMAX) 500 mg in sodium chloride 0.9 % 250 mL IVPB  Status:  Discontinued        500 mg 250 mL/hr over 60 Minutes Intravenous Every 24 hours 06/23/20 1926 06/23/20 1938   06/23/20 1845  vancomycin (VANCOREADY) IVPB 1250 mg/250 mL        1,250 mg 166.7 mL/hr over 90 Minutes Intravenous  Once 06/23/20 1839 06/23/20 2055   06/23/20 1830  ceFEPIme (MAXIPIME) 2 g in sodium chloride 0.9 % 100 mL IVPB        2 g 200 mL/hr over 30 Minutes Intravenous  Once 06/23/20 1815 06/23/20 1920       Objective: Vitals:   07/03/20 0351 07/03/20 0500 07/03/20 0745 07/03/20 0829  BP:      Pulse: (!) 56     Resp: 14     Temp:      TempSrc:      SpO2: 95%  96% 97%  Weight:  53.8 kg    Height:        Intake/Output Summary (Last 24 hours) at 07/03/2020 1122 Last data filed at 07/03/2020 0932 Gross  per 24 hour  Intake 914 ml  Output 3900 ml  Net -2986 ml   Filed Weights   07/01/20 0500 07/02/20 0600 07/03/20 0500  Weight: 52.2 kg 51.1 kg 53.8 kg    Examination: General exam: Appears calm and comfortable, thin and cachectic Respiratory system: Clear to auscultation. Respiratory effort normal.  On trach collar Cardiovascular system: S1 & S2 heard, RRR. No pedal edema. Gastrointestinal system: Abdomen is nondistended, soft and nontender. Normal bowel sounds heard. Central nervous system: Alert and oriented. Non focal exam. Speech clear  Psychiatry: Judgement and insight appear stable. Mood & affect appropriate.    Data Reviewed: I have personally reviewed following labs and imaging studies  CBC: Recent Labs  Lab 06/27/20 0318 06/28/20 0325 06/29/20 0300 06/30/20 0325 07/01/20 0339  WBC 7.1 6.1 7.2 6.7 6.2  NEUTROABS 5.6 4.6 5.8  --  4.5  HGB 7.4* 7.3* 9.2* 8.5* 9.3*  HCT  25.1* 23.8* 30.8* 27.8* 30.2*  MCV 89.6 87.2 89.0 87.7 87.8  PLT 232 212 232 242 196   Basic Metabolic Panel: Recent Labs  Lab 06/27/20 0318 06/28/20 0325 06/29/20 0300 06/30/20 0325 07/01/20 0339  NA 136 139 140 138 142  K 4.1 4.0 4.3 4.2 4.5  CL 105 101 98 99 102  CO2 26 30 34* 32 33*  GLUCOSE 99 93 78 78 73  BUN 20 25* 25* 25* 31*  CREATININE 0.57* 0.55* 0.65 0.64 0.70  CALCIUM 7.8* 8.0* 8.4* 8.3* 8.5*  MG 1.9  --  2.0 2.0 2.4   GFR: Estimated Creatinine Clearance: 71 mL/min (by C-G formula based on SCr of 0.7 mg/dL). Liver Function Tests: No results for input(s): AST, ALT, ALKPHOS, BILITOT, PROT, ALBUMIN in the last 168 hours. No results for input(s): LIPASE, AMYLASE in the last 168 hours. No results for input(s): AMMONIA in the last 168 hours. Coagulation Profile: No results for input(s): INR, PROTIME in the last 168 hours. Cardiac Enzymes: No results for input(s): CKTOTAL, CKMB, CKMBINDEX, TROPONINI in the last 168 hours. BNP (last 3 results) No results for input(s): PROBNP in the last 8760 hours. HbA1C: No results for input(s): HGBA1C in the last 72 hours. CBG: Recent Labs  Lab 07/02/20 1148 07/02/20 1805 07/02/20 2357 07/03/20 0616 07/03/20 0652  GLUCAP 83 112* 149* 62* 84   Lipid Profile: No results for input(s): CHOL, HDL, LDLCALC, TRIG, CHOLHDL, LDLDIRECT in the last 72 hours. Thyroid Function Tests: No results for input(s): TSH, T4TOTAL, FREET4, T3FREE, THYROIDAB in the last 72 hours. Anemia Panel: No results for input(s): VITAMINB12, FOLATE, FERRITIN, TIBC, IRON, RETICCTPCT in the last 72 hours. Sepsis Labs: No results for input(s): PROCALCITON, LATICACIDVEN in the last 168 hours.  Recent Results (from the past 240 hour(s))  SARS Coronavirus 2 by RT PCR (hospital order, performed in [facility] hospital lab) Nasopharyngeal Nasopharyngeal Swab     Status: None   Collection Time: 06/23/20  3:10 PM   Specimen: Nasopharyngeal Swab  Result Value Ref  Range Status   SARS Coronavirus 2 NEGATIVE NEGATIVE Final    Comment: (NOTE) SARS-CoV-2 target nucleic acids are NOT DETECTED.  The SARS-CoV-2 RNA is generally detectable in upper and lower respiratory specimens during the acute phase of infection. The lowest concentration of SARS-CoV-2 viral copies this assay can detect is 250 copies / mL. A negative result does not preclude SARS-CoV-2 infection and should not be used as the sole basis for treatment or other patient management decisions.  A negative result may occur with improper specimen  collection / handling, submission of specimen other than nasopharyngeal swab, presence of viral mutation(s) within the areas targeted by this assay, and inadequate number of viral copies (<250 copies / mL). A negative result must be combined with clinical observations, patient history, and epidemiological information.  Fact Sheet for Patients:   StrictlyIdeas.no  Fact Sheet for Healthcare Providers: BankingDealers.co.za  This test is not yet approved or  cleared by the [place] FDA and has been authorized for detection and/or diagnosis of SARS-CoV-2 by FDA under an Emergency Use Authorization (EUA).  This EUA will remain in effect (meaning this test can be used) for the duration of the COVID-19 declaration under Section 564(b)(1) of the Act, 21 U.S.C. section 360bbb-3(b)(1), unless the authorization is terminated or revoked sooner.  Performed at [facility], [place] [address] [place], [place] [address]   Culture, blood (routine x 2)     Status: None   Collection Time: 06/23/20  6:13 PM   Specimen: BLOOD  Result Value Ref Range Status   Specimen Description   Final    BLOOD LEFT ANTECUBITAL Performed at [place] [address] [place], [place] [address]    Special Requests   Final    BOTTLES DRAWN AEROBIC AND ANAEROBIC Blood Culture results may  not be optimal due to an inadequate volume of blood received in culture bottles Performed at [facility] [address], [place], [place] [address]    Culture   Final    NO GROWTH 5 DAYS Performed at [facility], [place] [address], [place], [place] [address]    Report Status 06/28/2020 FINAL  Final  Culture, blood (routine x 2)     Status: None   Collection Time: 06/23/20  6:38 PM   Specimen: BLOOD  Result Value Ref Range Status   Specimen Description   Final    BLOOD RIGHT ANTECUBITAL Performed at [place] [address] [place], [place] [address]    Special Requests   Final    BOTTLES DRAWN AEROBIC AND ANAEROBIC Blood Culture results may not be optimal due to an inadequate volume of blood received in culture bottles Performed at [place] [address], [place], [place] [address]    Culture   Final    NO GROWTH 5 DAYS Performed at [facility], [place] [address], [place], [place] [address]    Report Status 06/28/2020 FINAL  Final  Culture, sputum-assessment     Status: None   Collection Time: 06/23/20  7:24 PM   Specimen: Expectorated Sputum  Result Value Ref Range Status   Specimen Description EXPECTORATED SPUTUM  Final   Special Requests NONE  Final   Sputum evaluation   Final    THIS SPECIMEN IS ACCEPTABLE FOR SPUTUM CULTURE Performed at [facility], [place] [address], [place], [place] [address]    Report Status 06/23/2020 FINAL  Final  Culture, respiratory     Status: None   Collection Time: 06/23/20  7:24 PM  Result Value Ref Range Status   Specimen Description   Final    EXPECTORATED SPUTUM Performed at [place] [address] [place], [place] [address]    Special Requests   Final    NONE Reflexed from [id number] Performed at [place] [address], [place],  [address]    Gram Stain   Final    ABUNDANT WBC PRESENT,  PREDOMINANTLY PMN  ABUNDANT GRAM NEGATIVE RODS MODERATE GRAM POSITIVE RODS FEW GRAM POSITIVE COCCI    Culture   Final    ABUNDANT PROVIDENCIA RETTGERI MODERATE KLEBSIELLA PNEUMONIAE WITHIN NORMAL RESPIRATORY FLORA Performed at [facility], [place] [address] [place], [place] [address]    Report Status 06/27/2020 FINAL  Final   Organism ID, Bacteria PROVIDENCIA RETTGERI  Final   Organism ID, Bacteria KLEBSIELLA PNEUMONIAE  Final      Susceptibility   Klebsiella pneumoniae - MIC*    AMPICILLIN >=32 RESISTANT Resistant     CEFAZOLIN <=4 SENSITIVE Sensitive     CEFEPIME <=0.12 SENSITIVE Sensitive     CEFTAZIDIME <=1 SENSITIVE Sensitive     CEFTRIAXONE <=0.25 SENSITIVE Sensitive     CIPROFLOXACIN <=0.25 SENSITIVE Sensitive     GENTAMICIN <=1 SENSITIVE Sensitive     IMIPENEM <=0.25 SENSITIVE Sensitive     TRIMETH/SULFA <=20 SENSITIVE Sensitive     AMPICILLIN/SULBACTAM 4 SENSITIVE Sensitive     PIP/TAZO <=4 SENSITIVE Sensitive     * MODERATE KLEBSIELLA PNEUMONIAE   Providencia rettgeri - MIC*    AMPICILLIN >=32 RESISTANT Resistant     CEFAZOLIN >=64 RESISTANT Resistant     CEFEPIME 1 SENSITIVE Sensitive     CEFTAZIDIME <=1 SENSITIVE Sensitive     CEFTRIAXONE <=0.25 SENSITIVE Sensitive     CIPROFLOXACIN <=0.25 SENSITIVE Sensitive     GENTAMICIN <=1 SENSITIVE Sensitive     IMIPENEM 1 SENSITIVE Sensitive     TRIMETH/SULFA <=20 SENSITIVE Sensitive     AMPICILLIN/SULBACTAM 16 INTERMEDIATE Intermediate     PIP/TAZO >=128 RESISTANT Resistant     * ABUNDANT PROVIDENCIA RETTGERI  C Difficile Quick Screen w PCR reflex     Status: None   Collection Time: 06/25/20 11:35 AM   Specimen: STOOL  Result Value Ref Range Status   C Diff antigen NEGATIVE NEGATIVE Final   C Diff toxin NEGATIVE NEGATIVE Final   C Diff interpretation No C. difficile detected.  Final    Comment: Performed at [facility], [place] [address], [place], [place] [address]  Surgical pcr screen      Status: None   Collection Time: 06/28/20  6:30 PM   Specimen: Nasal Mucosa; Nasal Swab  Result Value Ref Range Status   MRSA, PCR NEGATIVE NEGATIVE Final   Staphylococcus aureus NEGATIVE NEGATIVE Final    Comment: (NOTE) The Xpert SA Assay (FDA approved for NASAL specimens in patients 78 years of age and older), is one component of a comprehensive surveillance program. It is not intended to diagnose infection nor to guide or monitor treatment. Performed at [facility], [place] [address] [place], [place] [address]       Radiology Studies: No results found.    Scheduled Meds: . sodium chloride   Intravenous Once  . arformoterol  15 mcg Nebulization BID  . budesonide  0.5 mg Nebulization BID  . feeding supplement (OSMOLITE 1.5 CAL)  474 mL Per Tube QID  . feeding supplement (PROSource TF)  45 mL Per Tube Daily  . free water  240 mL Per Tube BID  . guaiFENesin  15 mL Per Tube Q6H  . loratadine  10 mg Per Tube Daily  . methadone  120 mg Per Tube Daily  . pantoprazole sodium  40 mg Per Tube Daily  . revefenacin  175 mcg Nebulization Daily   Continuous Infusions: . dextrose 5 % and 0.9% NaCl 10 mL/hr at 06/29/20 0552     LOS: 9 days  Time spent: 20 minutes   Dessa Phi, DO [facility] 07/03/2020, 11:22 AM   Available via Epic secure chat 7am-7pm After these hours, please refer to coverage provider listed on [email or web address]

## 2020-07-04 DIAGNOSIS — A419 Sepsis, unspecified organism: Secondary | ICD-10-CM | POA: Diagnosis not present

## 2020-07-04 DIAGNOSIS — R6520 Severe sepsis without septic shock: Secondary | ICD-10-CM | POA: Diagnosis not present

## 2020-07-04 LAB — GLUCOSE, CAPILLARY
Glucose-Capillary: 110 mg/dL — ABNORMAL HIGH (ref 70–99)
Glucose-Capillary: 57 mg/dL — ABNORMAL LOW (ref 70–99)
Glucose-Capillary: 69 mg/dL — ABNORMAL LOW (ref 70–99)
Glucose-Capillary: 70 mg/dL (ref 70–99)
Glucose-Capillary: 79 mg/dL (ref 70–99)
Glucose-Capillary: 90 mg/dL (ref 70–99)

## 2020-07-04 MED ORDER — OSMOLITE 1.5 CAL PO LIQD
711.0000 mL | Freq: Four times a day (QID) | ORAL | Status: DC
  Administered 2020-07-04 (×2): 711 mL
  Administered 2020-07-05 (×2): 474 mL
  Administered 2020-07-05 (×2): 711 mL
  Filled 2020-07-04 (×9): qty 711

## 2020-07-04 NOTE — Plan of Care (Signed)
  Problem: Health Behavior/Discharge Planning: Goal: Ability to manage health-related needs will improve Outcome: Progressing   Problem: Activity: Goal: Risk for activity intolerance will decrease Outcome: Progressing   

## 2020-07-04 NOTE — Progress Notes (Signed)
Hypoglycemic Event  CBG: 69 (Notified from nurse tech Sarah at 0610 AM)  Treatment: Osmolite 1.5 CAL 474 mL at 0612  Symptoms: No symptoms at this time.   Follow-up CBG: Time: 0642 CBG Result:79  Possible Reasons for Event: Potentially not enough oral intake or not enough nutrition.   Comments/MD notified: Lovey Newcomer, NP ([facility] Floor Coverage) at [phone].    Anda Kraft

## 2020-07-04 NOTE — Progress Notes (Signed)
Nutrition Follow-up  DOCUMENTATION CODES:   Severe malnutrition in context of chronic illness,Underweight  INTERVENTION:   Tube Feeding via PEG: Change to reported home regimen Increase to 711 mL (3 cartons) of Osmolite 1.5 4 times daily; spread out feedings to 0600, 1100, 1600, 2200 D/C Pro-Source TF 45 mL daily Provides 4260 kcals, 179 g of protein and 2172 mL of free water Provides 578 g of carbohydrate per day (increased from 386 g); 145 g of carbohydrate per feeding (increased from 96 g). 54% calories from carobydrates   NUTRITION DIAGNOSIS:   Severe Malnutrition related to chronic illness,cancer and cancer related treatments as evidenced by severe fat depletion,severe muscle depletion.  Being addressed via TF   GOAL:   Patient will meet greater than or equal to 90% of their needs  Progressing  MONITOR:   TF tolerance,Labs,Weight trends,Skin  REASON FOR ASSESSMENT:   Consult Assessment of nutrition requirement/status  ASSESSMENT:   65 y.o. 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 He presented to the ED due to shortness of breath that began 2 days PTA and progressively worsened. EMS was called to the home and he was noted to be 80% on room air.  Dietitian Consult received to re-evaluate TF given hypoglycemic episodes  Current TF order 474 mL (2 cartons) Osmolite 1.5 4 times daily  with Pro-Source 45 mL daily. Pt tolerating regimen expect for hypoglycemia.  Noted per chart review, pt has been utilizing 3 cartons of Osmolite 1.5 4 times daily.   Plan to resume this reported home regimen to see if this might help with hypoglycemia by increasing total calories and thus increasing total carbohydrates. With this change, plan to also adjust regimen, spreading  feedings out slightly to see if this helps as well. If not, will have to readdress. It is unclear at this time why pt continues to have hypoglycemic episodes despite TF and D5 infusion  Other TF regimen options if change does not improve hypoglycemia include: combination of bolus feedings in conjunction with nocturnal feedings, addition of fiber containing formula, increasing bolus frequency to 5 or 6 times per day  +formed stool today  Labs: CBGs 60-125 Meds: D5-NS at 75 ml/hr, methadone, imodium prn   Diet Order:   Diet Order            Diet NPO time specified  Diet effective now                 EDUCATION NEEDS:   No education needs have been identified at this time  Skin:  Skin Assessment: Skin Integrity Issues: Skin Integrity Issues:: Other (Comment),Incisions Incisions: L2nd toe amputation (2/7) Other: MASD to perineum  Last BM:  2/4  Height:   Ht Readings from Last 1 Encounters:  06/30/20 6' (1.829 m)    Weight:   Wt Readings from Last 1 Encounters:  07/04/20 51.5 kg    Ideal Body Weight:  80.9 kg  BMI:  Body mass index is 15.41 kg/m.  Estimated Nutritional Needs:   Kcal:  2500-2700 kcal  Protein:  120-135 grams  Fluid:  >/= 2.5 L/day   Kerman Passey MS, RDN, LDN, CNSC Registered Dietitian III Clinical Nutrition RD Pager and On-Call Pager Number Located in [place]

## 2020-07-04 NOTE — Progress Notes (Signed)
PROGRESS NOTE    Brannan Cassedy  EXN:[id number] DOB: 04/25/1956 DOA: 06/23/2020 PCP: [name], MD     Brief Narrative:  Ronald Ruiz is a 65 year old gentleman history of COPD, laryngeal cancer status post radiation resection with tracheostomy since decannulated, oropharyngeal dysphagia with chronic pulmonary aspiration currently PEG dependent, substance use disorder on chronic methadone presenting for worsening shortness of breath. Patient noted to be hypotensive with systolic blood pressures in the 80s, tachycardic with a heart rate of 105, sats of 83% on 10 L O2 via trach collar. Patient given some saline nebs with some improvement. Patient also noted to have a leukocytosis. Chest x-ray done concerning for multifocal pneumonia. Patient received a dose of IV Solu-Medrol, placed empirically on IV antibiotics and hospitalist called for admission. Patient also underwent left second toe amputation due to osteomyelitis.   New events last 24 hours / Subjective: He voices no new complaints or concerns today.  Awaiting SNF placement.  Assessment & Plan:   Principal Problem:   Severe sepsis ([place]) Active Problems:   Malignant neoplasm of glottis (HCC)   Protein-calorie malnutrition, severe   Dysphagia   COPD (chronic obstructive pulmonary disease) (HCC)   Normocytic anemia   Chronic pulmonary aspiration   Acute respiratory failure with hypoxia ([place])   Community acquired pneumonia   Diarrhea   Abnormally small toe   Hypernatremia   Closed nondisplaced fracture of lesser toe of left foot with malunion   Severe sepsis and acute respiratory failure with hypoxia secondary to multifocal pneumonia -Patient noted to be hypoxic on admission with sats of 83% on 10 L O2 via trach collar on arrival. Patient also with a leukocytosis, elevated lactic acid level, tachycardic with heart rate up to 105, chest x-ray with multifocal pneumonia. SARS coronavirus PCR negative. Urine strep  pneumococcus antigen negative, urine Legionella antigen negative. Sputum Gram stain and cultures with abundant procidentia rettgeri/moderate Klebsiella pneumonia which is resistant to ampicillin and cefazolin but sensitive to cefepime, ceftazidime, Rocephin, ciprofloxacin, gentamicin, Bactrim.  -IV Rocephin and oral doxycycline have been discontinued and patient started on Levaquin to complete a 10-day course of antibiotic treatment. Dr. Grandville Silos discussed with ID, Dr. Johnnye Sima about antibiotic treatment.  Patient also seen by pulmonary during this hospitalization.  Outpatient follow-up with pulmonary. -Now on room air, trach collar  COPD -Continue Pulmicort nebs, Brovana, yupelri as recommended by pulmonary.  Pulmonary hygiene.   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 Continue stoma and PEG tube care. Dietitian consulted.  Severe protein calorie malnutrition -Secondary to chronic illness. Dietitian consulted and following.  Continue current tube feeds.   -Reconsulted dietitian for evaluation of tube feeding due to continued hypoglycemic episodes -Remains on D5 IV fluid  Anemia of chronic disease/iron deficiency anemia -Patient with no overt bleeding, hemoglobin trickling down likely due to dilutional effect. Anemia panel consistent with anemia of chronic disease/iron deficiency. Iron level at 13. Status post transfusion 1 unit packed red blood cells, IV Feraheme x1.  Will likely need oral iron supplementation on discharge.   Abnormality of left second toe/left second toe commuted fracture/osteomyelitis of second left toe -Left second toe dark with some thickening of the skin and tender to palpation.  Plain films done consistent with a commuted fracture involving distal portion of the second proximal phalanx with  severe dislocation of the second middle phalanx relative to the second proximal phalanx.  CRP elevated at 9.2.  Sed rate elevated at 70.  Patient was on IV antibiotics has been transitioned to oral Levaquin.  Podiatry consulted and they assessed the patient and performed closed reduction of the left second toe with splinting.  Surgical shoe ordered.  Podiatry reviewed films of the foot again and due to concerns for probable osteomyelitis and patient subsequently underwent toe amputation 06/29/2020 without any complications.  Per podiatry clean margins with no further osteomyelitis noted and amputation has treated osteomyelitis no extended antibiotics needed for osteomyelitis.  Outpatient follow-up with podiatry.  Diarrhea -Patient with multiple loose stools per patient and per RN early on in the hospitalization which has since resolved.  C. difficile PCR negative.  WBC normalized.  Rectal tube has been discontinued.  Chronic pain -Continue methadone     DVT prophylaxis:  Place and maintain sequential compression device Start: 06/30/20 7846  Code Status: Full code Family Communication: No family at bedside Disposition Plan:  Status is: Inpatient  Remains inpatient appropriate because:Unsafe d/c plan   Dispo: The patient is from: Home              Anticipated d/c is to: SNF              Anticipated d/c date is: 1 day              Patient currently is medically stable to d/c.  Awaiting SNF placement   Difficult to place patient No   Antimicrobials:  Anti-infectives (From admission, onward)   Start     Dose/Rate Route Frequency Ordered Stop   06/27/20 1700  levofloxacin (LEVAQUIN) 25 MG/ML solution 750 mg  Status:  Discontinued        750 mg Per Tube Daily 06/27/20 1437 07/02/20 1336   06/27/20 1230  cefdinir (OMNICEF) 125 MG/5ML suspension 300 mg  Status:  Discontinued        300 mg Per Tube 2 times daily 06/27/20 1140 06/27/20 1437   06/23/20 2200  cefTRIAXone (ROCEPHIN) 2 g in  sodium chloride 0.9 % 100 mL IVPB  Status:  Discontinued        2 g 200 mL/hr over 30 Minutes Intravenous Every 24 hours 06/23/20 1926 06/27/20 1140   06/23/20 2200  doxycycline (VIBRA-TABS) tablet 100 mg  Status:  Discontinued        100 mg Per Tube Every 12 hours 06/23/20 1941 06/27/20 1437   06/23/20 2000  azithromycin (ZITHROMAX) 500 mg in sodium chloride 0.9 % 250 mL IVPB  Status:  Discontinued        500 mg 250 mL/hr over 60 Minutes Intravenous Every 24 hours 06/23/20 1926 06/23/20 1938   06/23/20 1845  vancomycin (VANCOREADY) IVPB 1250 mg/250 mL        1,250 mg 166.7 mL/hr over 90 Minutes Intravenous  Once 06/23/20 1839 06/23/20 2055   06/23/20 1830  ceFEPIme (MAXIPIME) 2 g in sodium chloride 0.9 % 100 mL IVPB        2 g 200 mL/hr over 30 Minutes Intravenous  Once 06/23/20 1815 06/23/20 1920       Objective: Vitals:   07/04/20 0748 07/04/20 0752 07/04/20 0753 07/04/20 0755  BP:      Pulse:      Resp:      Temp:      TempSrc:      SpO2: 100% 100% 100% 100%  Weight:      Height:        Intake/Output Summary (Last 24  hours) at 07/04/2020 1037 Last data filed at 07/04/2020 0835 Gross per 24 hour  Intake 2828.67 ml  Output 2825 ml  Net 3.67 ml   Filed Weights   07/02/20 0600 07/03/20 0500 07/04/20 0500  Weight: 51.1 kg 53.8 kg 51.5 kg    Examination: General exam: Appears calm and comfortable, thin and frail Respiratory system: Clear to auscultation. Respiratory effort normal.  Trach collar in place Cardiovascular system: S1 & S2 heard, RRR. No pedal edema. Gastrointestinal system: Abdomen is nondistended, soft and nontender. Normal bowel sounds heard. Central nervous system: Alert and oriented. Non focal exam.  Psychiatry: Judgement and insight appear stable. Mood & affect appropriate.    Data Reviewed: I have personally reviewed following labs and imaging studies  CBC: Recent Labs  Lab 06/28/20 0325 06/29/20 0300 06/30/20 0325 07/01/20 0339  WBC 6.1 7.2  6.7 6.2  NEUTROABS 4.6 5.8  --  4.5  HGB 7.3* 9.2* 8.5* 9.3*  HCT 23.8* 30.8* 27.8* 30.2*  MCV 87.2 89.0 87.7 87.8  PLT 212 232 242 235   Basic Metabolic Panel: Recent Labs  Lab 06/28/20 0325 06/29/20 0300 06/30/20 0325 07/01/20 0339  NA 139 140 138 142  K 4.0 4.3 4.2 4.5  CL 101 98 99 102  CO2 30 34* 32 33*  GLUCOSE 93 78 78 73  BUN 25* 25* 25* 31*  CREATININE 0.55* 0.65 0.64 0.70  CALCIUM 8.0* 8.4* 8.3* 8.5*  MG  --  2.0 2.0 2.4   GFR: Estimated Creatinine Clearance: 68 mL/min (by C-G formula based on SCr of 0.7 mg/dL). Liver Function Tests: No results for input(s): AST, ALT, ALKPHOS, BILITOT, PROT, ALBUMIN in the last 168 hours. No results for input(s): LIPASE, AMYLASE in the last 168 hours. No results for input(s): AMMONIA in the last 168 hours. Coagulation Profile: No results for input(s): INR, PROTIME in the last 168 hours. Cardiac Enzymes: No results for input(s): CKTOTAL, CKMB, CKMBINDEX, TROPONINI in the last 168 hours. BNP (last 3 results) No results for input(s): PROBNP in the last 8760 hours. HbA1C: No results for input(s): HGBA1C in the last 72 hours. CBG: Recent Labs  Lab 07/03/20 1804 07/03/20 1827 07/03/20 2354 07/04/20 0559 07/04/20 0642  GLUCAP 63* 90 123* 69* 79   Lipid Profile: No results for input(s): CHOL, HDL, LDLCALC, TRIG, CHOLHDL, LDLDIRECT in the last 72 hours. Thyroid Function Tests: No results for input(s): TSH, T4TOTAL, FREET4, T3FREE, THYROIDAB in the last 72 hours. Anemia Panel: No results for input(s): VITAMINB12, FOLATE, FERRITIN, TIBC, IRON, RETICCTPCT in the last 72 hours. Sepsis Labs: No results for input(s): PROCALCITON, LATICACIDVEN in the last 168 hours.  Recent Results (from the past 240 hour(s))  C Difficile Quick Screen w PCR reflex     Status: None   Collection Time: 06/25/20 11:35 AM   Specimen: STOOL  Result Value Ref Range Status   C Diff antigen NEGATIVE NEGATIVE Final   C Diff toxin NEGATIVE NEGATIVE Final    C Diff interpretation No C. difficile detected.  Final    Comment: Performed at [facility], [place] [address] [place], [place] [address]  Surgical pcr screen     Status: None   Collection Time: 06/28/20  6:30 PM   Specimen: Nasal Mucosa; Nasal Swab  Result Value Ref Range Status   MRSA, PCR NEGATIVE NEGATIVE Final   Staphylococcus aureus NEGATIVE NEGATIVE Final    Comment: (NOTE) The Xpert SA Assay (FDA approved for NASAL specimens in patients 59 years of age  and older), is one component of a comprehensive surveillance program. It is not intended to diagnose infection nor to guide or monitor treatment. Performed at [facility], [place] [address] [place], [place] [address]       Radiology Studies: No results found.    Scheduled Meds: . arformoterol  15 mcg Nebulization BID  . budesonide  0.5 mg Nebulization BID  . feeding supplement (OSMOLITE 1.5 CAL)  474 mL Per Tube QID  . feeding supplement (PROSource TF)  45 mL Per Tube Daily  . free water  240 mL Per Tube BID  . guaiFENesin  15 mL Per Tube Q6H  . loratadine  10 mg Per Tube Daily  . methadone  120 mg Per Tube Daily  . pantoprazole sodium  40 mg Per Tube Daily  . revefenacin  175 mcg Nebulization Daily   Continuous Infusions: . dextrose 5 % and 0.9% NaCl 75 mL/hr at 07/04/20 0200     LOS: 10 days      Time spent: 10 minutes   Dessa Phi, DO [facility] 07/04/2020, 10:37 AM   Available via Epic secure chat 7am-7pm After these hours, please refer to coverage provider listed on [email or web address]

## 2020-07-04 NOTE — Plan of Care (Signed)

## 2020-07-05 DIAGNOSIS — R6520 Severe sepsis without septic shock: Secondary | ICD-10-CM | POA: Diagnosis not present

## 2020-07-05 DIAGNOSIS — A419 Sepsis, unspecified organism: Secondary | ICD-10-CM | POA: Diagnosis not present

## 2020-07-05 LAB — GLUCOSE, CAPILLARY
Glucose-Capillary: 115 mg/dL — ABNORMAL HIGH (ref 70–99)
Glucose-Capillary: 65 mg/dL — ABNORMAL LOW (ref 70–99)
Glucose-Capillary: 81 mg/dL (ref 70–99)

## 2020-07-05 NOTE — Plan of Care (Signed)
  Problem: Health Behavior/Discharge Planning: Goal: Ability to manage health-related needs will improve Outcome: Progressing   Problem: Clinical Measurements: Goal: Respiratory complications will improve Outcome: Progressing   Problem: Coping: Goal: Level of anxiety will decrease Outcome: Progressing   Problem: Pain Managment: Goal: General experience of comfort will improve Outcome: Progressing   

## 2020-07-05 NOTE — Progress Notes (Signed)
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 Patient noted to be hypotensive with systolic blood pressures in the 80s, tachycardic with a heart rate of 105, sats of 83% on 10 L O2 via trach collar. Patient given some saline nebs with some improvement. Patient also noted to have a leukocytosis. Chest x-ray done concerning for multifocal pneumonia. Patient received a dose of IV Solu-Medrol, placed empirically on IV antibiotics and hospitalist called for admission. Patient also underwent left second toe amputation due to osteomyelitis.  Hospitalization further complicated by hypoglycemic episodes and SNF placement  New events last 24 hours / Subjective: Having some diarrhea, tube feedings were increased yesterday due to hypoglycemic episode.  Assessment & Plan:   Principal Problem:   Severe sepsis ([place]) Active Problems:   Malignant neoplasm of glottis (HCC)   Protein-calorie malnutrition, severe   Dysphagia   COPD (chronic obstructive pulmonary disease) (HCC)   Normocytic anemia   Chronic pulmonary aspiration   Acute respiratory failure with hypoxia ([place])   Community acquired pneumonia   Diarrhea   Abnormally small toe   Hypernatremia   Closed nondisplaced fracture of lesser toe of left foot with malunion   Severe sepsis and acute respiratory failure with hypoxia secondary to multifocal pneumonia -Patient noted to be hypoxic on admission with sats of 83% on 10 L O2 via trach collar on arrival. Patient also with a leukocytosis, elevated lactic acid level, tachycardic with heart rate up to  105, chest x-ray with multifocal pneumonia. SARS coronavirus PCR negative. Urine strep pneumococcus antigen negative, urine Legionella antigen negative. Sputum Gram stain and cultures with abundant procidentia rettgeri/moderate Klebsiella pneumonia which is resistant to ampicillin and cefazolin but sensitive to cefepime, ceftazidime, Rocephin, ciprofloxacin, gentamicin, Bactrim.  -IV Rocephin and oral doxycycline have been discontinued and patient started on Levaquin to complete a 10-day course of antibiotic treatment. Dr. Grandville Silos discussed with ID, Dr. Johnnye Sima about antibiotic treatment.  Patient also seen by pulmonary during this hospitalization.  Outpatient follow-up with pulmonary. -Now on room air, trach collar  COPD -Continue Pulmicort nebs, Brovana, yupelri as recommended by pulmonary.  Pulmonary hygiene.   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 Continue stoma and PEG tube care. Dietitian consulted.  Severe protein calorie malnutrition -Secondary to chronic illness. Dietitian consulted and following.  Continue current tube feeds.   -Reconsulted dietitian for evaluation of tube feeding due to hypoglycemic episodes -Remains on D5 IV fluid  Anemia of chronic disease/iron deficiency anemia -Patient with no overt bleeding, hemoglobin trickling down likely due to dilutional effect. Anemia panel consistent with anemia of chronic disease/iron deficiency. Iron level at 13. Status post transfusion 1 unit packed red blood cells, IV Feraheme x1.  Will likely need oral iron supplementation on discharge.   Abnormality of left second toe/left second toe commuted fracture/osteomyelitis of second left toe -Left second toe dark with some thickening of the skin and tender to palpation.  Plain films done consistent with a  commuted fracture involving distal portion of the second proximal phalanx with severe dislocation of the second  middle phalanx relative to the second proximal phalanx.  CRP elevated at 9.2.  Sed rate elevated at 70.  Patient was on IV antibiotics has been transitioned to oral Levaquin.  Podiatry consulted and they assessed the patient and performed closed reduction of the left second toe with splinting.  Surgical shoe ordered.  Podiatry reviewed films of the foot again and due to concerns for probable osteomyelitis and patient subsequently underwent toe amputation 06/29/2020 without any complications.  Per podiatry clean margins with no further osteomyelitis noted and amputation has treated osteomyelitis no extended antibiotics needed for osteomyelitis.  Outpatient follow-up with podiatry.  Diarrhea -Patient with multiple loose stools per patient and per RN early on in the hospitalization which has since resolved.  C. difficile PCR negative.  WBC normalized.  Rectal tube has been discontinued. -Monitor  Chronic pain -Continue methadone     DVT prophylaxis:  Place and maintain sequential compression device Start: 06/30/20 0833  Code Status: Full code Family Communication: No family at bedside Disposition Plan:  Status is: Inpatient  Remains inpatient appropriate because:Unsafe d/c plan   Dispo: The patient is from: Home              Anticipated d/c is to: SNF              Anticipated d/c date is: 1 day              Patient currently is medically stable to d/c.  Awaiting SNF placement   Difficult to place patient No   Antimicrobials:  Anti-infectives (From admission, onward)   Start     Dose/Rate Route Frequency Ordered Stop   06/27/20 1700  levofloxacin (LEVAQUIN) 25 MG/ML solution 750 mg  Status:  Discontinued        750 mg Per Tube Daily 06/27/20 1437 07/02/20 1336   06/27/20 1230  cefdinir (OMNICEF) 125 MG/5ML suspension 300 mg  Status:  Discontinued        300 mg Per Tube 2  times daily 06/27/20 1140 06/27/20 1437   06/23/20 2200  cefTRIAXone (ROCEPHIN) 2 g in sodium chloride 0.9 % 100 mL IVPB  Status:  Discontinued        2 g 200 mL/hr over 30 Minutes Intravenous Every 24 hours 06/23/20 1926 06/27/20 1140   06/23/20 2200  doxycycline (VIBRA-TABS) tablet 100 mg  Status:  Discontinued        100 mg Per Tube Every 12 hours 06/23/20 1941 06/27/20 1437   06/23/20 2000  azithromycin (ZITHROMAX) 500 mg in sodium chloride 0.9 % 250 mL IVPB  Status:  Discontinued        500 mg 250 mL/hr over 60 Minutes Intravenous Every 24 hours 06/23/20 1926 06/23/20 1938   06/23/20 1845  vancomycin (VANCOREADY) IVPB 1250 mg/250 mL        1,250 mg 166.7 mL/hr over 90 Minutes Intravenous  Once 06/23/20 1839 06/23/20 2055   06/23/20 1830  ceFEPIme (MAXIPIME) 2 g in sodium chloride 0.9 % 100 mL IVPB        2 g 200 mL/hr over 30 Minutes Intravenous  Once 06/23/20 1815 06/23/20 1920       Objective: Vitals:   07/05/20 0903 07/05/20 0906 07/05/20 0907 07/05/20 0912  BP:      Pulse:      Resp:      Temp:      TempSrc:      SpO2: 99% 100% 100% 100%  Weight:      Height:  Intake/Output Summary (Last 24 hours) at 07/05/2020 1019 Last data filed at 07/05/2020 1000 Gross per 24 hour  Intake 5363.68 ml  Output 3200 ml  Net 2163.68 ml   Filed Weights   07/02/20 0600 07/03/20 0500 07/04/20 0500  Weight: 51.1 kg 53.8 kg 51.5 kg    Examination: General exam: Appears calm and comfortable, thin Respiratory system: Clear to auscultation. Respiratory effort normal.  Appears without distress.  Trach collar present Cardiovascular system: S1 & S2 heard, RRR. No pedal edema. Gastrointestinal system: Abdomen is nondistended, soft and nontender. Normal bowel sounds heard. Central nervous system: Alert and oriented. Non focal exam. Speech clear  Psychiatry: Judgement and insight appear stable. Mood & affect appropriate.    Data Reviewed: I have personally reviewed following labs  and imaging studies  CBC: Recent Labs  Lab 06/29/20 0300 06/30/20 0325 07/01/20 0339  WBC 7.2 6.7 6.2  NEUTROABS 5.8  --  4.5  HGB 9.2* 8.5* 9.3*  HCT 30.8* 27.8* 30.2*  MCV 89.0 87.7 87.8  PLT 232 242 086   Basic Metabolic Panel: Recent Labs  Lab 06/29/20 0300 06/30/20 0325 07/01/20 0339  NA 140 138 142  K 4.3 4.2 4.5  CL 98 99 102  CO2 34* 32 33*  GLUCOSE 78 78 73  BUN 25* 25* 31*  CREATININE 0.65 0.64 0.70  CALCIUM 8.4* 8.3* 8.5*  MG 2.0 2.0 2.4   GFR: Estimated Creatinine Clearance: 68 mL/min (by C-G formula based on SCr of 0.7 mg/dL). Liver Function Tests: No results for input(s): AST, ALT, ALKPHOS, BILITOT, PROT, ALBUMIN in the last 168 hours. No results for input(s): LIPASE, AMYLASE in the last 168 hours. No results for input(s): AMMONIA in the last 168 hours. Coagulation Profile: No results for input(s): INR, PROTIME in the last 168 hours. Cardiac Enzymes: No results for input(s): CKTOTAL, CKMB, CKMBINDEX, TROPONINI in the last 168 hours. BNP (last 3 results) No results for input(s): PROBNP in the last 8760 hours. HbA1C: No results for input(s): HGBA1C in the last 72 hours. CBG: Recent Labs  Lab 07/04/20 1156 07/04/20 1404 07/04/20 1757 07/04/20 2342 07/05/20 0634  GLUCAP 57* 70 90 110* 115*   Lipid Profile: No results for input(s): CHOL, HDL, LDLCALC, TRIG, CHOLHDL, LDLDIRECT in the last 72 hours. Thyroid Function Tests: No results for input(s): TSH, T4TOTAL, FREET4, T3FREE, THYROIDAB in the last 72 hours. Anemia Panel: No results for input(s): VITAMINB12, FOLATE, FERRITIN, TIBC, IRON, RETICCTPCT in the last 72 hours. Sepsis Labs: No results for input(s): PROCALCITON, LATICACIDVEN in the last 168 hours.  Recent Results (from the past 240 hour(s))  C Difficile Quick Screen w PCR reflex     Status: None   Collection Time: 06/25/20 11:35 AM   Specimen: STOOL  Result Value Ref Range Status   C Diff antigen NEGATIVE NEGATIVE Final   C Diff  toxin NEGATIVE NEGATIVE Final   C Diff interpretation No C. difficile detected.  Final    Comment: Performed at [facility], [place] [address] [place], [place] [address]  Surgical pcr screen     Status: None   Collection Time: 06/28/20  6:30 PM   Specimen: Nasal Mucosa; Nasal Swab  Result Value Ref Range Status   MRSA, PCR NEGATIVE NEGATIVE Final   Staphylococcus aureus NEGATIVE NEGATIVE Final    Comment: (NOTE) The Xpert SA Assay (FDA approved for NASAL specimens in patients 61 years of age and older), is one component of a comprehensive surveillance program. It is not intended  to diagnose infection nor to guide or monitor treatment. Performed at [facility], [place] [address] [place], [place] [address]       Radiology Studies: No results found.    Scheduled Meds: . arformoterol  15 mcg Nebulization BID  . budesonide  0.5 mg Nebulization BID  . feeding supplement (OSMOLITE 1.5 CAL)  711 mL Per Tube QID  . free water  240 mL Per Tube BID  . guaiFENesin  15 mL Per Tube Q6H  . loratadine  10 mg Per Tube Daily  . methadone  120 mg Per Tube Daily  . pantoprazole sodium  40 mg Per Tube Daily  . revefenacin  175 mcg Nebulization Daily   Continuous Infusions: . dextrose 5 % and 0.9% NaCl 75 mL/hr at 07/05/20 0243     LOS: 11 days      Time spent: 10 minutes   Dessa Phi, DO [facility] 07/05/2020, 10:19 AM   Available via Epic secure chat 7am-7pm After these hours, please refer to coverage provider listed on [email or web address]

## 2020-07-06 DIAGNOSIS — R6520 Severe sepsis without septic shock: Secondary | ICD-10-CM | POA: Diagnosis not present

## 2020-07-06 DIAGNOSIS — A419 Sepsis, unspecified organism: Secondary | ICD-10-CM | POA: Diagnosis not present

## 2020-07-06 DIAGNOSIS — L8990 Pressure ulcer of unspecified site, unspecified stage: Secondary | ICD-10-CM | POA: Insufficient documentation

## 2020-07-06 LAB — GLUCOSE, CAPILLARY
Glucose-Capillary: 71 mg/dL (ref 70–99)
Glucose-Capillary: 73 mg/dL (ref 70–99)
Glucose-Capillary: 75 mg/dL (ref 70–99)
Glucose-Capillary: 96 mg/dL (ref 70–99)

## 2020-07-06 MED ORDER — KATE FARMS STANDARD 1.4 PO LIQD
650.0000 mL | Freq: Every day | ORAL | Status: DC
  Administered 2020-07-06 – 2020-07-07 (×6): 650 mL
  Administered 2020-07-07: 325 mL
  Administered 2020-07-07 – 2020-07-08 (×3): 650 mL
  Filled 2020-07-06 (×11): qty 650

## 2020-07-06 MED ORDER — FREE WATER
120.0000 mL | Freq: Every day | Status: DC
  Administered 2020-07-06 – 2020-07-07 (×3): 120 mL
  Administered 2020-07-07: 60 mL
  Administered 2020-07-07 – 2020-07-08 (×5): 120 mL

## 2020-07-06 NOTE — Progress Notes (Signed)
Nutrition Follow-up  DOCUMENTATION CODES:   Severe malnutrition in context of chronic illness,Underweight  INTERVENTION:  - will change TF regimen to 2 cartons (650 ml) [employer] 1.4 x5/day with 60 ml water before and 60 ml water after each TF bolus. - d/c additional 240 ml free water BID. - this regimen will provide 4550 kcal, 200 grams protein, 510 grams carbohydrate, and 3540 ml free water.     NUTRITION DIAGNOSIS:   Severe Malnutrition related to chronic illness,cancer and cancer related treatments as evidenced by severe fat depletion,severe muscle depletion. -ongoing  GOAL:   Patient will meet greater than or equal to 90% of their needs -met with TF regimen  MONITOR:   TF tolerance,Labs,Weight trends,Skin  REASON FOR ASSESSMENT:   Consult Enteral/tube feeding initiation and management  ASSESSMENT:   65 y.o. 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 He presented to the ED due to shortness of breath that began 2 days PTA and progressively worsened. EMS was called to the home and he was noted to be 80% on room air.  Patient currently ordered 3 cartons (711 ml) Osmolite 1.5 QID. This regimen is providing 4260 kcal, 179 grams protein, 578 grams carbohydrate, and 2172 ml free water.   Messaged received from RN that patient is refusing Osmolite 1.5 and wants to switch back to [employer] d/t diarrhea right after TF bolus administration.   Ensure that TF is room temperature and that each carton of TF is administered over 15 minutes; requires 45 minutes per feeding with current regimen.  Cold TF or TF boluses administered more quickly can lead to diarrhea.   Weight has been mainly stable throughout hospitalization.    Patient is from home and notes indicate plan for  d/c to SNF at time of discharge.    Labs reviewed; CBGs: 96, 73, 71 mg/dl. Medications reviewed; 40 mg protonix per PEG/day. IVF; D5-NS @ 75 ml/hr (306 kcal/24 hours).    Diet Order:   Diet Order            Diet NPO time specified  Diet effective now                 EDUCATION NEEDS:   No education needs have been identified at this time  Skin:  Skin Assessment: Skin Integrity Issues: Skin Integrity Issues:: Other (Comment),Incisions Incisions: L2nd toe amputation (2/7) Other: MASD to perineum  Last BM:  2/14 (type 7 x2)  Height:   Ht Readings from Last 1 Encounters:  06/30/20 6' (1.829 m)    Weight:   Wt Readings from Last 1 Encounters:  07/06/20 51.8 kg     Estimated Nutritional Needs:  Kcal:  2500-2700 kcal Protein:  120-135 grams Fluid:  >/= 2.5 L/day     Jarome Matin, MS, RD, LDN, CNSC Inpatient Clinical Dietitian RD pager # available in [place]  After hours/weekend pager # available in [facility]

## 2020-07-06 NOTE — Progress Notes (Signed)
cbg 73, patient asymptomatic, refusing tube feed this morning due to intolerance and immediate diarrhea afterwards, gave 266ml's of apple juice with morning robitussin and pain medication, will continue to monitor.

## 2020-07-06 NOTE — Consult Note (Addendum)
[facility] Nurse Consult Note: Reason for Consult: Consult requested for bilat ischium and coccyx.  Pt is extremely emaciated with protruding coccyx bone.  Wound type: Coccyx with previously noted moisture associated skin damage and partial thickness skin loss to bilat buttocks and perineum, and stage 2 pressure injury, present on admission according to nursing flow sheet.  Has evolved to stage 3 pressure injury; 1.5X1.5X.2cm, 50% red/50% yellow and dry Left hip with stage 1 pressure injury; 6X6cm red and nonblanchable intact skin Right ischium is dark red but no pressure injury noted. Pressure Injury POA: Yes Dressing procedure/placement/frequency: Foam dressing to coccyx to protect from further injury.  Please re-consult if further assistance is needed.  Thank-you,  Julien Girt MSN, [place], [place], [place], [place]

## 2020-07-06 NOTE — Plan of Care (Signed)
  Problem: Pain Managment: Goal: General experience of comfort will improve Outcome: Progressing   Problem: Safety: Goal: Ability to remain free from injury will improve Outcome: Progressing   

## 2020-07-06 NOTE — Progress Notes (Signed)
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 Patient noted to be hypotensive with systolic blood pressures in the 80s, tachycardic with a heart rate of 105, sats of 83% on 10 L O2 via trach collar. Patient given some saline nebs with some improvement. Patient also noted to have a leukocytosis. Chest x-ray done concerning for multifocal pneumonia. Patient received a dose of IV Solu-Medrol, placed empirically on IV antibiotics and hospitalist called for admission. Patient also underwent left second toe amputation due to osteomyelitis.  Hospitalization further complicated by hypoglycemic episodes and SNF placement  New events last 24 hours / Subjective: Does not check blood sugar at home so unaware if having hypoglycemic episodes at home. Has no symptoms with hypoglycemia. His blood sugar ranging 50s-110s.   Assessment & Plan:   Principal Problem:   Severe sepsis ([place]) Active Problems:   Malignant neoplasm of glottis (HCC)   Protein-calorie malnutrition, severe   Dysphagia   COPD (chronic obstructive pulmonary disease) (HCC)   Normocytic anemia   Chronic pulmonary aspiration   Acute respiratory failure with hypoxia ([place])   Community acquired pneumonia   Diarrhea   Abnormally small toe   Hypernatremia   Closed nondisplaced fracture of lesser toe of left foot with malunion   Pressure injury of skin   Severe sepsis and acute respiratory failure with hypoxia secondary to multifocal pneumonia -Patient noted to be hypoxic on admission with sats of 83% on 10 L O2 via trach collar on arrival.  Patient also with a leukocytosis, elevated lactic acid level, tachycardic with heart rate up to 105, chest x-ray with multifocal pneumonia. SARS coronavirus PCR negative. Urine strep pneumococcus antigen negative, urine Legionella antigen negative. Sputum Gram stain and cultures with abundant procidentia rettgeri/moderate Klebsiella pneumonia which is resistant to ampicillin and cefazolin but sensitive to cefepime, ceftazidime, Rocephin, ciprofloxacin, gentamicin, Bactrim.  -IV Rocephin and oral doxycycline have been discontinued and patient started on Levaquin to complete a 10-day course of antibiotic treatment. Dr. Grandville Silos discussed with ID, Dr. Johnnye Sima about antibiotic treatment.  Patient also seen by pulmonary during this hospitalization.  Outpatient follow-up with pulmonary. -Now on room air, trach collar  COPD -Continue Pulmicort nebs, Brovana, yupelri as recommended by pulmonary.  Pulmonary hygiene.   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 Continue stoma and PEG tube care. Dietitian consulted.  Severe protein calorie malnutrition -Secondary to chronic illness. Dietitian consulted and following.  Continue current tube feeds.   -Reconsulted dietitian for evaluation of tube feeding due to hypoglycemic episodes -Remains on D5 IV fluid  Anemia of chronic disease/iron deficiency anemia -Patient with no overt bleeding, hemoglobin trickling down likely due to dilutional effect. Anemia panel consistent with anemia of chronic disease/iron deficiency. Iron level at 13. Status post transfusion 1 unit packed red blood cells, IV Feraheme x1.  Will likely need oral iron supplementation on discharge.   Abnormality of left second toe/left second toe commuted fracture/osteomyelitis of second left toe -Left second toe dark  with some thickening of the skin and tender to palpation.  Plain films  done consistent with a commuted fracture involving distal portion of the second proximal phalanx with severe dislocation of the second middle phalanx relative to the second proximal phalanx.  CRP elevated at 9.2.  Sed rate elevated at 70.  Patient was on IV antibiotics has been transitioned to oral Levaquin.  Podiatry consulted and they assessed the patient and performed closed reduction of the left second toe with splinting.  Surgical shoe ordered.  Podiatry reviewed films of the foot again and due to concerns for probable osteomyelitis and patient subsequently underwent toe amputation 06/29/2020 without any complications.  Per podiatry clean margins with no further osteomyelitis noted and amputation has treated osteomyelitis no extended antibiotics needed for osteomyelitis.  Outpatient follow-up with podiatry.  Diarrhea -Patient with multiple loose stools per patient and per RN early on in the hospitalization which has since resolved.  C. difficile PCR negative.  WBC normalized.  Rectal tube has been discontinued. -Monitor  Chronic pain -Continue methadone     DVT prophylaxis:  Place and maintain sequential compression device Start: 06/30/20 0833  Code Status: Full code Family Communication: No family at bedside Disposition Plan:  Status is: Inpatient  Remains inpatient appropriate because:Unsafe d/c plan   Dispo: The patient is from: Home              Anticipated d/c is to: SNF              Anticipated d/c date is: 1 day              Patient currently is medically stable to d/c.  Awaiting SNF placement, barrier due to methadone use.    Difficult to place patient Yes   Antimicrobials:  Anti-infectives (From admission, onward)   Start     Dose/Rate Route Frequency Ordered Stop   06/27/20 1700  levofloxacin (LEVAQUIN) 25 MG/ML solution 750 mg  Status:  Discontinued        750 mg Per Tube Daily 06/27/20 1437  07/02/20 1336   06/27/20 1230  cefdinir (OMNICEF) 125 MG/5ML suspension 300 mg  Status:  Discontinued        300 mg Per Tube 2 times daily 06/27/20 1140 06/27/20 1437   06/23/20 2200  cefTRIAXone (ROCEPHIN) 2 g in sodium chloride 0.9 % 100 mL IVPB  Status:  Discontinued        2 g 200 mL/hr over 30 Minutes Intravenous Every 24 hours 06/23/20 1926 06/27/20 1140   06/23/20 2200  doxycycline (VIBRA-TABS) tablet 100 mg  Status:  Discontinued        100 mg Per Tube Every 12 hours 06/23/20 1941 06/27/20 1437   06/23/20 2000  azithromycin (ZITHROMAX) 500 mg in sodium chloride 0.9 % 250 mL IVPB  Status:  Discontinued        500 mg 250 mL/hr over 60 Minutes Intravenous Every 24 hours 06/23/20 1926 06/23/20 1938   06/23/20 1845  vancomycin (VANCOREADY) IVPB 1250 mg/250 mL        1,250 mg 166.7 mL/hr over 90 Minutes Intravenous  Once 06/23/20 1839 06/23/20 2055   06/23/20 1830  ceFEPIme (MAXIPIME) 2 g in sodium chloride 0.9 % 100 mL IVPB        2 g 200 mL/hr over 30 Minutes Intravenous  Once 06/23/20 1815 06/23/20 1920       Objective: Vitals:   07/05/20 2059 07/06/20 0017 07/06/20 0500 07/06/20 0801  BP: 118/63     Pulse: 80 81 96   Resp: 16 18 18  Temp: 98.1 F (36.7 C)     TempSrc: Oral     SpO2: 96% 96% 92% 96%  Weight:   51.8 kg   Height:        Intake/Output Summary (Last 24 hours) at 07/06/2020 1257 Last data filed at 07/06/2020 0600 Gross per 24 hour  Intake 1679.95 ml  Output 2425 ml  Net -745.05 ml   Filed Weights   07/03/20 0500 07/04/20 0500 07/06/20 0500  Weight: 53.8 kg 51.5 kg 51.8 kg    Examination: General exam: Appears calm and comfortable, cachetic  Respiratory system: Clear to auscultation. Respiratory effort normal. On trach collar  Cardiovascular system: S1 & S2 heard, RRR. No pedal edema. Gastrointestinal system: Abdomen is nondistended, soft and nontender. Normal bowel sounds heard. Central nervous system: Alert and oriented. Non focal exam. Speech  clear  Psychiatry: Judgement and insight appear stable. Mood & affect appropriate.    Data Reviewed: I have personally reviewed following labs and imaging studies  CBC: Recent Labs  Lab 06/30/20 0325 07/01/20 0339  WBC 6.7 6.2  NEUTROABS  --  4.5  HGB 8.5* 9.3*  HCT 27.8* 30.2*  MCV 87.7 87.8  PLT 242 517   Basic Metabolic Panel: Recent Labs  Lab 06/30/20 0325 07/01/20 0339  NA 138 142  K 4.2 4.5  CL 99 102  CO2 32 33*  GLUCOSE 78 73  BUN 25* 31*  CREATININE 0.64 0.70  CALCIUM 8.3* 8.5*  MG 2.0 2.4   GFR: Estimated Creatinine Clearance: 68.3 mL/min (by C-G formula based on SCr of 0.7 mg/dL). Liver Function Tests: No results for input(s): AST, ALT, ALKPHOS, BILITOT, PROT, ALBUMIN in the last 168 hours. No results for input(s): LIPASE, AMYLASE in the last 168 hours. No results for input(s): AMMONIA in the last 168 hours. Coagulation Profile: No results for input(s): INR, PROTIME in the last 168 hours. Cardiac Enzymes: No results for input(s): CKTOTAL, CKMB, CKMBINDEX, TROPONINI in the last 168 hours. BNP (last 3 results) No results for input(s): PROBNP in the last 8760 hours. HbA1C: No results for input(s): HGBA1C in the last 72 hours. CBG: Recent Labs  Lab 07/05/20 1210 07/05/20 1758 07/06/20 0002 07/06/20 0616 07/06/20 1130  GLUCAP 65* 81 96 73 71   Lipid Profile: No results for input(s): CHOL, HDL, LDLCALC, TRIG, CHOLHDL, LDLDIRECT in the last 72 hours. Thyroid Function Tests: No results for input(s): TSH, T4TOTAL, FREET4, T3FREE, THYROIDAB in the last 72 hours. Anemia Panel: No results for input(s): VITAMINB12, FOLATE, FERRITIN, TIBC, IRON, RETICCTPCT in the last 72 hours. Sepsis Labs: No results for input(s): PROCALCITON, LATICACIDVEN in the last 168 hours.  Recent Results (from the past 240 hour(s))  Surgical pcr screen     Status: None   Collection Time: 06/28/20  6:30 PM   Specimen: Nasal Mucosa; Nasal Swab  Result Value Ref Range Status    MRSA, PCR NEGATIVE NEGATIVE Final   Staphylococcus aureus NEGATIVE NEGATIVE Final    Comment: (NOTE) The Xpert SA Assay (FDA approved for NASAL specimens in patients 42 years of age and older), is one component of a comprehensive surveillance program. It is not intended to diagnose infection nor to guide or monitor treatment. Performed at [facility], [place] [address], [place], [place] [address]       Radiology Studies: No results found.    Scheduled Meds: . arformoterol  15 mcg Nebulization BID  . budesonide  0.5 mg Nebulization BID  . feeding supplement (OSMOLITE 1.5 CAL)  711 mL Per Tube QID  . free water  240 mL Per Tube BID  . guaiFENesin  15 mL Per Tube Q6H  . loratadine  10 mg Per Tube Daily  . methadone  120 mg Per Tube Daily  . pantoprazole sodium  40 mg Per Tube Daily  . revefenacin  175 mcg Nebulization Daily   Continuous Infusions: . dextrose 5 % and 0.9% NaCl 75 mL/hr at 07/06/20 0613     LOS: 12 days      Time spent: 15 minutes   Dessa Phi, DO [facility] 07/06/2020, 12:57 PM   Available via Epic secure chat 7am-7pm After these hours, please refer to coverage provider listed on [email or web address]

## 2020-07-07 DIAGNOSIS — R6520 Severe sepsis without septic shock: Secondary | ICD-10-CM | POA: Diagnosis not present

## 2020-07-07 DIAGNOSIS — A419 Sepsis, unspecified organism: Secondary | ICD-10-CM | POA: Diagnosis not present

## 2020-07-07 LAB — GLUCOSE, CAPILLARY
Glucose-Capillary: 102 mg/dL — ABNORMAL HIGH (ref 70–99)
Glucose-Capillary: 68 mg/dL — ABNORMAL LOW (ref 70–99)
Glucose-Capillary: 71 mg/dL (ref 70–99)
Glucose-Capillary: 75 mg/dL (ref 70–99)
Glucose-Capillary: 84 mg/dL (ref 70–99)
Glucose-Capillary: 95 mg/dL (ref 70–99)

## 2020-07-07 MED ORDER — DEXTROSE 10 % IV SOLN: INTRAVENOUS | Status: DC

## 2020-07-07 NOTE — Progress Notes (Signed)
Hypoglycemic Event  CBG: 68 @ 00:16  Treatment: Tube Feed Bolus  Symptoms: none  Follow-up CBG: Time: 00:59 CBG Result: 102  Possible Reasons for Event: poor intake  Comments/MD notified: no    Nelida Meuse

## 2020-07-07 NOTE — TOC Progression Note (Addendum)
Transition of Care [facility]) - Progression Note    Patient Details  Name: Ronald Ruiz MRN: [id number] Date of Birth: [birth year]  Transition of Care [facility]) CM/SW [place], [place] Phone Number:  07/07/2020, 4:12 PM  Clinical Narrative:    CSW discussed Methadone Medication is a barrier to SNF placement. CSW reached out to a total of 41 SNF's across several counties in network with the [facility]. Patient and daughter express disappointment however understand the barrier. At this point, the patient will transition back home with his daughter Ronald Ruiz with [facility] services in place with all disciplines. Patient will discharge on new Tube [employer] brand. Patient will also need humidified oxygen. Patient is active with [facility].CSW made a referral for the TF and oxygen. Physician will need to complete an order form each. CSW will provide the order form when available.    Patient express concerns about the center that provides his Methadone. He reports they have stopped administering the methadone dosage due to his hospitalization. The physician at the clinic would like to speak with our attending physician about his dosage and reinstatement after discharge. CSW notified the physician. CSW informed to follow up with the oncoming physician.    Expected Discharge Plan: [place] Barriers to Discharge: No SNF bed  Expected Discharge Plan and Services Expected Discharge Plan: [place] In-house Referral: Clinical Social Work Discharge Planning Services: CM Consult Post Acute Care Choice: [place] arrangements for the past 2 months: Single Family Home                   DME Agency: [employer]       HH Arranged: PT,OT,RN,Nurse's Aide,Social Work [employer] Agency: [facility] [facility] [employer] Date [place]: 06/25/20 Time [place]: [place]  ([place]) Interventions    Readmission Risk Interventions Readmission Risk Prevention Plan 07/04/2019  Transportation Screening Complete  Medication Review Press photographer) Complete  PCP or Specialist appointment within 3-5 days of discharge Complete  HRI or [place] Complete  SW Recovery Care/Counseling Consult Complete  [place] Not Applicable  Some recent data might be hidden

## 2020-07-07 NOTE — Progress Notes (Signed)
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 Patient noted to be hypotensive with systolic blood pressures in the 80s, tachycardic with a heart rate of 105, sats of 83% on 10 L O2 via trach collar. Patient given some saline nebs with some improvement. Patient also noted to have a leukocytosis. Chest x-ray done concerning for multifocal pneumonia. Patient received a dose of IV Solu-Medrol, placed empirically on IV antibiotics and hospitalist called for admission.  Pt also s/p left second toe amputation due to left second digit osteomyelitis on 06/29/20.   OT comments  Patient progressing well with mobility, min G and min cues for safety with walker management and hand placement during stand to sit. Pt request to not use trach collar with functional ambulation, desat to 89% however once back to recliner and on 5L 28% FiO2 pt quickly recover to 92%. Pt requiring max A for donning shoes prior to functional ambulation. Continue with POC   Follow Up Recommendations  SNF    Equipment Recommendations  Tub/shower seat       Precautions / Restrictions Precautions Precaution Comments: trach stoma, currently on 28% trach collar but monitor sats Required Braces or Orthoses: Other Brace Other Brace: post op shoe for left second toe amputation       Mobility Bed Mobility               General bed mobility comments: in recliner  Transfers Overall transfer level: Needs assistance Equipment used: Rolling walker (2 wheeled) Transfers: Sit to/from Stand Sit to Stand: Min guard          General transfer comment: no physical assistance to power up to standing, min G for safety and cues to reach back when sitting in recliner    Balance Overall balance assessment: Needs assistance;History of Falls Sitting-balance support: Feet supported Sitting balance-Leahy Scale: Good     Standing balance support: Bilateral upper extremity supported Standing balance-Leahy Scale: Poor Standing balance comment: reliant on UE support                           ADL either performed or assessed with clinical judgement   ADL Overall ADL's : Needs assistance/impaired                     Lower Body Dressing: Maximal assistance;Sitting/lateral leans Lower Body Dressing Details (indicate cue type and reason): to don shoes "I can't reach that far" does attempt to help slide R heel into sneaker Toilet Transfer: Min guard;Ambulation;RW Toilet Transfer Details (indicate cue type and reason): simulated with functional ambulation and transfer to recliner chair, min G and min cues for safety when turning with walker to slow down to minimize risk of fall         Functional mobility during ADLs: Min guard;Rolling walker                 Cognition Arousal/Alertness: Awake/alert Behavior During Therapy: WFL for tasks assessed/performed Overall Cognitive Status: Within Functional Limits for tasks assessed  General Comments patient request to try ambulation without trach collar, desat to 89% post activity. returned to 5L and 28% FiO2 once in recliner with quick recovery to 92%    Pertinent Vitals/ Pain       Pain Assessment: Faces Faces Pain Scale: No hurt         Frequency  Min 2X/week        Progress Toward Goals  OT Goals(current goals can now be found in the care plan section)  Progress towards OT goals: Progressing toward goals  Acute Rehab OT Goals Patient Stated Goal: ST SNF for rehab  then home OT Goal Formulation: With patient/family Time For Goal Achievement: 07/15/20 Potential to Achieve Goals: Good ADL Goals Pt Will Perform Lower Body Bathing: with modified independence;sit to/from stand Pt Will Perform Lower Body Dressing: with modified independence;sit to/from stand Pt Will Transfer to Toilet: with modified independence;ambulating Pt Will Perform Toileting - Clothing Manipulation and hygiene: with modified independence;sit to/from stand Pt Will Perform Tub/Shower Transfer: with modified independence;ambulating;3 in 1;rolling walker  Plan Discharge plan remains appropriate       AM-PAC OT "6 Clicks" Daily Activity     Outcome Measure   Help from another person eating meals?: Total (PEG) Help from another person taking care of personal grooming?: A Little Help from another person toileting, which includes using toliet, bedpan, or urinal?: A Little Help from another person bathing (including washing, rinsing, drying)?: A Little Help from another person to put on and taking off regular upper body clothing?: None Help from another person to put on and taking off regular lower body clothing?: A Lot 6 Click Score: 16    End of Session Equipment Utilized During Treatment: Rolling walker  OT Visit Diagnosis: Unsteadiness on feet (R26.81);Muscle weakness (generalized) (M62.81);Pain Pain - Right/Left: Left Pain - part of body: Ankle and joints of foot   Activity Tolerance Patient tolerated treatment well   Patient Left in chair;with call bell/phone within reach   Nurse Communication Mobility status        Time: 0569-7948 OT Time Calculation (min): 16 min  Charges: OT General Charges $OT Visit: 1 Visit OT Treatments $Self Care/Home Management : 8-22 mins  Delbert Phenix OT OT pager: [place] 07/07/2020, 9:45 AM

## 2020-07-07 NOTE — Progress Notes (Signed)
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 Patient noted to be hypotensive with systolic blood pressures in the 80s, tachycardic with a heart rate of 105, sats of 83% on 10 L O2 via trach collar. Patient given some saline nebs with some improvement. Patient also noted to have a leukocytosis. Chest x-ray done concerning for multifocal pneumonia. Patient received a dose of IV Solu-Medrol, placed empirically on IV antibiotics and hospitalist called for admission.  Pt also s/p left second toe amputation due to left second digit osteomyelitis on 06/29/20.    PT Comments    Pt progressing, improving activity tolerance. amb on RA per his request today. SpO2=93-88%. One standing rest during amb x 380'. Continue to follow in acute setting  Follow Up Recommendations  SNF;Other (comment) (vs-HH?- difficult placement)     Equipment Recommendations  Other (comment) (rollator)    Recommendations for Other Services       Precautions / Restrictions Precautions Precaution Comments: on Trach collar at rest. amb on RA, monitor sats Required Braces or Orthoses: Other Brace Other Brace: post op shoe for left second toe amputation Restrictions Weight Bearing Restrictions: No    Mobility  Bed Mobility Overal bed mobility: Modified Independent Bed Mobility: Supine to Sit           General bed mobility comments: in recliner    Transfers Overall transfer level: Needs assistance Equipment used: Rolling walker (2 wheeled) Transfers: Sit to/from Stand Sit to Stand: Min guard         General  transfer comment: no physical assistance to power up to standing, min G for safety and cues to reach back when sitting in recliner  Ambulation/Gait Ambulation/Gait assistance: Min guard Gait Distance (Feet): 380 Feet Assistive device: Rolling walker (2 wheeled) Gait Pattern/deviations: Step-through pattern;Decreased stride length     General Gait Details: verbal cues for posture,   Stairs             Wheelchair Mobility    Modified Rankin (Stroke Patients Only)       Balance Overall balance assessment: Needs assistance;History of Falls Sitting-balance support: Feet supported Sitting balance-Leahy Scale: Good     Standing balance support: Bilateral upper extremity supported Standing balance-Leahy Scale: Fair Standing balance comment: briefly able to static stand, don mask without UEs upport; reliant on UEs for dynamic tasks                            Cognition Arousal/Alertness: Awake/alert Behavior During Therapy: WFL for tasks assessed/performed Overall Cognitive Status: Within Functional Limits for tasks assessed                                 General Comments: pleasant and agreeale to amb      Exercises      General Comments General comments (skin integrity, edema, etc.): pt requests amb on RA      Pertinent Vitals/Pain Pain Assessment: No/denies pain Faces Pain Scale: No hurt    Home Living  Prior Function            PT Goals (current goals can now be found in the care plan section) Acute Rehab PT Goals Patient Stated Goal: ST SNF for rehab then home PT Goal Formulation: With patient Time For Goal Achievement: 07/15/20 Potential to Achieve Goals: Good Progress towards PT goals: Progressing toward goals    Frequency    Min 2X/week      PT Plan Current plan remains appropriate    Co-evaluation              AM-PAC PT "6 Clicks" Mobility   Outcome Measure  Help needed  turning from your back to your side while in a flat bed without using bedrails?: None Help needed moving from lying on your back to sitting on the side of a flat bed without using bedrails?: None Help needed moving to and from a bed to a chair (including a wheelchair)?: A Little Help needed standing up from a chair using your arms (e.g., wheelchair or bedside chair)?: A Little Help needed to walk in hospital room?: A Little Help needed climbing 3-5 steps with a railing? : A Lot 6 Click Score: 19    End of Session Equipment Utilized During Treatment: Gait belt Activity Tolerance: Patient tolerated treatment well Patient left: in chair;with call bell/phone within reach   PT Visit Diagnosis: Difficulty in walking, not elsewhere classified (R26.2);Muscle weakness (generalized) (M62.81);Adult, failure to thrive (R62.7)     Time: 2919-1660 PT Time Calculation (min) (ACUTE ONLY): 15 min  Charges:  $Gait Training: 8-22 mins                     Baxter Flattery, PT  Acute Rehab Dept ([place]) [phone] Pager [phone]  07/07/2020    [facility] 07/07/2020, 12:31 PM

## 2020-07-07 NOTE — Progress Notes (Signed)
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 Patient noted to be hypotensive with systolic blood pressures in the 80s, tachycardic with a heart rate of 105, sats of 83% on 10 L O2 via trach collar. Patient given some saline nebs with some improvement. Patient also noted to have a leukocytosis. Chest x-ray done concerning for multifocal pneumonia. Patient received a dose of IV Solu-Medrol, placed empirically on IV antibiotics and hospitalist called for admission. Patient also underwent left second toe amputation due to osteomyelitis.  Hospitalization further complicated by hypoglycemic episodes and SNF placement  New events last 24 hours / Subjective: Sitting in recliner this morning.  No new complaints.  Had hypoglycemic episode this morning.  Assessment & Plan:   Principal Problem:   Severe sepsis ([place]) Active Problems:   Malignant neoplasm of glottis (HCC)   Protein-calorie malnutrition, severe   Dysphagia   COPD (chronic obstructive pulmonary disease) (HCC)   Normocytic anemia   Chronic pulmonary aspiration   Acute respiratory failure with hypoxia ([place])   Community acquired pneumonia   Diarrhea   Abnormally small toe   Hypernatremia   Closed nondisplaced fracture of lesser toe of left foot with malunion   Pressure injury of skin   Severe sepsis and acute respiratory failure with hypoxia secondary to multifocal pneumonia -Patient noted to be hypoxic on admission with sats of 83% on 10 L O2 via trach collar on arrival. Patient also with a leukocytosis, elevated lactic acid level,  tachycardic with heart rate up to 105, chest x-ray with multifocal pneumonia. SARS coronavirus PCR negative. Urine strep pneumococcus antigen negative, urine Legionella antigen negative. Sputum Gram stain and cultures with abundant procidentia rettgeri/moderate Klebsiella pneumonia which is resistant to ampicillin and cefazolin but sensitive to cefepime, ceftazidime, Rocephin, ciprofloxacin, gentamicin, Bactrim.  -IV Rocephin and oral doxycycline have been discontinued and patient started on Levaquin to complete a 10-day course of antibiotic treatment. Dr. Grandville Silos discussed with ID, Dr. Johnnye Sima about antibiotic treatment.  Patient also seen by pulmonary during this hospitalization.  Outpatient follow-up with pulmonary. -Now on room air, trach collar  COPD -Continue Pulmicort nebs, Brovana, yupelri as recommended by pulmonary.  Pulmonary hygiene.   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 Continue stoma and PEG tube care. Dietitian consulted.  Severe protein calorie malnutrition -Secondary to chronic illness. Dietitian consulted and following.  Continue current tube feeds.   -Reconsulted dietitian for evaluation of tube feeding due to hypoglycemic episodes -Change to D10 IV fluid  Anemia of chronic disease/iron deficiency anemia -Patient with no overt bleeding, hemoglobin trickling down likely due to dilutional effect. Anemia panel consistent with anemia of chronic disease/iron deficiency. Iron level at 13. Status post transfusion 1 unit packed red blood cells, IV Feraheme x1.  Will likely need oral iron supplementation on discharge.   Abnormality of left second toe/left second toe commuted fracture/osteomyelitis of second left toe -Left second toe dark with some thickening of the skin and tender to palpation.   Plain films done consistent with a commuted fracture involving distal portion of the  second proximal phalanx with severe dislocation of the second middle phalanx relative to the second proximal phalanx.  CRP elevated at 9.2.  Sed rate elevated at 70.  Patient was on IV antibiotics has been transitioned to oral Levaquin.  Podiatry consulted and they assessed the patient and performed closed reduction of the left second toe with splinting.  Surgical shoe ordered.  Podiatry reviewed films of the foot again and due to concerns for probable osteomyelitis and patient subsequently underwent toe amputation 06/29/2020 without any complications.  Per podiatry clean margins with no further osteomyelitis noted and amputation has treated osteomyelitis no extended antibiotics needed for osteomyelitis.  Outpatient follow-up with podiatry.  Diarrhea -Patient with multiple loose stools per patient and per RN early on in the hospitalization which has since resolved.  C. difficile PCR negative.  WBC normalized.  Rectal tube has been discontinued. -Monitor  Chronic pain -Continue methadone     DVT prophylaxis:  Place and maintain sequential compression device Start: 06/30/20 0833  Code Status: Full code Family Communication: No family at bedside Disposition Plan:  Status is: Inpatient  Remains inpatient appropriate because:Unsafe d/c plan   Dispo: The patient is from: Home              Anticipated d/c is to: SNF              Anticipated d/c date is: 1 day              Patient currently is not medically stable to d/c.  Awaiting SNF placement, barrier due to methadone use.  Remains on D10 due to recurrent hypoglycemic episodes.   Difficult to place patient Yes   Antimicrobials:  Anti-infectives (From admission, onward)   Start     Dose/Rate Route Frequency Ordered Stop   06/27/20 1700  levofloxacin (LEVAQUIN) 25 MG/ML solution 750 mg  Status:  Discontinued        750 mg Per Tube Daily 06/27/20 1437  07/02/20 1336   06/27/20 1230  cefdinir (OMNICEF) 125 MG/5ML suspension 300 mg  Status:  Discontinued        300 mg Per Tube 2 times daily 06/27/20 1140 06/27/20 1437   06/23/20 2200  cefTRIAXone (ROCEPHIN) 2 g in sodium chloride 0.9 % 100 mL IVPB  Status:  Discontinued        2 g 200 mL/hr over 30 Minutes Intravenous Every 24 hours 06/23/20 1926 06/27/20 1140   06/23/20 2200  doxycycline (VIBRA-TABS) tablet 100 mg  Status:  Discontinued        100 mg Per Tube Every 12 hours 06/23/20 1941 06/27/20 1437   06/23/20 2000  azithromycin (ZITHROMAX) 500 mg in sodium chloride 0.9 % 250 mL IVPB  Status:  Discontinued        500 mg 250 mL/hr over 60 Minutes Intravenous Every 24 hours 06/23/20 1926 06/23/20 1938   06/23/20 1845  vancomycin (VANCOREADY) IVPB 1250 mg/250 mL        1,250 mg 166.7 mL/hr over 90 Minutes Intravenous  Once 06/23/20 1839 06/23/20 2055   06/23/20 1830  ceFEPIme (MAXIPIME) 2 g in sodium chloride 0.9 % 100 mL IVPB        2 g 200 mL/hr over 30 Minutes Intravenous  Once 06/23/20 1815 06/23/20 1920       Objective: Vitals:   07/07/20 0025 07/07/20 0403 07/07/20 0540 07/07/20 0749  BP: 98/60  (!) 108/57   Pulse: 64 75 65   Resp: 16 18 17    Temp:  98.4 F (36.9 C)   TempSrc:   Oral   SpO2: 100% 96% 98% 92%  Weight:      Height:        Intake/Output Summary (Last 24 hours) at 07/07/2020 1102 Last data filed at 07/07/2020 8676 Gross per 24 hour  Intake 3271.51 ml  Output 3580 ml  Net -308.49 ml   Filed Weights   07/03/20 0500 07/04/20 0500 07/06/20 0500  Weight: 53.8 kg 51.5 kg 51.8 kg    Examination: General exam: Appears calm and comfortable, cachectic and malnourished Respiratory system: Clear to auscultation. Respiratory effort normal.  On trach collar Cardiovascular system: S1 & S2 heard, RRR. No pedal edema. Gastrointestinal system: Abdomen is nondistended, soft and nontender. Normal bowel sounds heard. Central nervous system: Alert and oriented. Non  focal exam. Speech clear  Extremities: Symmetric in appearance bilaterally  Skin: No rashes, lesions or ulcers on exposed skin  Psychiatry: Judgement and insight appear stable. Mood & affect appropriate.    Data Reviewed: I have personally reviewed following labs and imaging studies  CBC: Recent Labs  Lab 07/01/20 0339  WBC 6.2  NEUTROABS 4.5  HGB 9.3*  HCT 30.2*  MCV 87.8  PLT 195   Basic Metabolic Panel: Recent Labs  Lab 07/01/20 0339  NA 142  K 4.5  CL 102  CO2 33*  GLUCOSE 73  BUN 31*  CREATININE 0.70  CALCIUM 8.5*  MG 2.4   GFR: Estimated Creatinine Clearance: 68.3 mL/min (by C-G formula based on SCr of 0.7 mg/dL). Liver Function Tests: No results for input(s): AST, ALT, ALKPHOS, BILITOT, PROT, ALBUMIN in the last 168 hours. No results for input(s): LIPASE, AMYLASE in the last 168 hours. No results for input(s): AMMONIA in the last 168 hours. Coagulation Profile: No results for input(s): INR, PROTIME in the last 168 hours. Cardiac Enzymes: No results for input(s): CKTOTAL, CKMB, CKMBINDEX, TROPONINI in the last 168 hours. BNP (last 3 results) No results for input(s): PROBNP in the last 8760 hours. HbA1C: No results for input(s): HGBA1C in the last 72 hours. CBG: Recent Labs  Lab 07/06/20 1130 07/06/20 1737 07/07/20 0016 07/07/20 0059 07/07/20 0538  GLUCAP 71 75 68* 102* 75   Lipid Profile: No results for input(s): CHOL, HDL, LDLCALC, TRIG, CHOLHDL, LDLDIRECT in the last 72 hours. Thyroid Function Tests: No results for input(s): TSH, T4TOTAL, FREET4, T3FREE, THYROIDAB in the last 72 hours. Anemia Panel: No results for input(s): VITAMINB12, FOLATE, FERRITIN, TIBC, IRON, RETICCTPCT in the last 72 hours. Sepsis Labs: No results for input(s): PROCALCITON, LATICACIDVEN in the last 168 hours.  Recent Results (from the past 240 hour(s))  Surgical pcr screen     Status: None   Collection Time: 06/28/20  6:30 PM   Specimen: Nasal Mucosa; Nasal Swab   Result Value Ref Range Status   MRSA, PCR NEGATIVE NEGATIVE Final   Staphylococcus aureus NEGATIVE NEGATIVE Final    Comment: (NOTE) The Xpert SA Assay (FDA approved for NASAL specimens in patients 46 years of age and older), is one component of a comprehensive surveillance program. It is not intended to diagnose infection nor to guide or monitor treatment. Performed at [facility], [place] [address] [place], [place] [address]       Radiology Studies: No results found.    Scheduled Meds: . arformoterol  15 mcg Nebulization BID  . budesonide  0.5 mg Nebulization BID  . feeding supplement (KATE FARMS STANDARD 1.4)  650 mL Per Tube 5 X Daily  .  free water  120 mL Per Tube 5 X Daily  . guaiFENesin  15 mL Per Tube Q6H  . loratadine  10 mg Per Tube Daily  . methadone  120 mg Per Tube Daily  . pantoprazole sodium  40 mg Per Tube Daily  . revefenacin  175 mcg Nebulization Daily   Continuous Infusions: . dextrose 50 mL/hr at 07/07/20 0918     LOS: 13 days      Time spent: 15 minutes   Dessa Phi, DO [facility] 07/07/2020, 11:02 AM   Available via Epic secure chat 7am-7pm After these hours, please refer to coverage provider listed on [email or web address]

## 2020-07-08 DIAGNOSIS — T17908A Unspecified foreign body in respiratory tract, part unspecified causing other injury, initial encounter: Secondary | ICD-10-CM

## 2020-07-08 DIAGNOSIS — A419 Sepsis, unspecified organism: Secondary | ICD-10-CM | POA: Diagnosis not present

## 2020-07-08 DIAGNOSIS — Z8521 Personal history of malignant neoplasm of larynx: Secondary | ICD-10-CM

## 2020-07-08 DIAGNOSIS — R1310 Dysphagia, unspecified: Secondary | ICD-10-CM | POA: Diagnosis not present

## 2020-07-08 DIAGNOSIS — G894 Chronic pain syndrome: Secondary | ICD-10-CM

## 2020-07-08 DIAGNOSIS — J9601 Acute respiratory failure with hypoxia: Secondary | ICD-10-CM | POA: Diagnosis not present

## 2020-07-08 DIAGNOSIS — L89303 Pressure ulcer of unspecified buttock, stage 3: Secondary | ICD-10-CM

## 2020-07-08 DIAGNOSIS — E162 Hypoglycemia, unspecified: Secondary | ICD-10-CM

## 2020-07-08 LAB — GLUCOSE, CAPILLARY
Glucose-Capillary: 57 mg/dL — ABNORMAL LOW (ref 70–99)
Glucose-Capillary: 79 mg/dL (ref 70–99)
Glucose-Capillary: 83 mg/dL (ref 70–99)
Glucose-Capillary: 90 mg/dL (ref 70–99)
Glucose-Capillary: 96 mg/dL (ref 70–99)

## 2020-07-08 MED ORDER — KATE FARMS STANDARD 1.4 PO LIQD
650.0000 mL | Freq: Every day | ORAL | Status: DC
  Administered 2020-07-08 – 2020-07-10 (×10): 650 mL
  Filled 2020-07-08 (×9): qty 650

## 2020-07-08 MED ORDER — FREE WATER
120.0000 mL | Freq: Every day | Status: DC
  Administered 2020-07-08 – 2020-07-10 (×9): 120 mL

## 2020-07-08 NOTE — TOC Progression Note (Signed)
Transition of Care [facility]) - Progression Note    Patient Details  Name: Ronald Ruiz MRN: [id number] Date of Birth: May 25, 1955  Transition of Care [facility]) CM/SW [place], [place] Phone Number: 07/08/2020, 11:07 AM  Clinical Narrative:    Patient gave CSW permission to reach out to the Elouise Munroe at the [facility] (methadone clinic). He reports the agency wanted to follow up with the patient about establishing care through another physician due to the patient current medical status. The center will coordinate a plan with the patient and his daughter about ongoing care at the center. Shanon Brow hopes they can continue to service the patient until another physician is established.He will discuss with the clinic physician tomorrow and provide an update.  Patient updated at bedside.   Expected Discharge Plan: [place] Barriers to Discharge: Continued Medical Work up  Expected Discharge Plan and Services Expected Discharge Plan: [place] In-house Referral: Clinical Social Work Discharge Planning Services: CM Consult Post Acute Care Choice: [place] arrangements for the past 2 months: Single Family Home                   DME Agency: [employer]       HH Arranged: PT,OT,RN,Ruiz's Aide,Social Work [place]: [facility] [facility] [employer] Date [place]: 06/25/20 Time [place]: [place] ([place]) Interventions    Readmission Risk Interventions Readmission Risk Prevention Plan 07/04/2019  Transportation Screening Complete  Medication Review Press photographer) Complete  PCP or Specialist appointment within 3-5 days of discharge Complete  HRI or [place] Complete  SW Recovery Care/Counseling Consult Complete  [place] Not Applicable  Some recent data might be hidden

## 2020-07-08 NOTE — Progress Notes (Signed)
PROGRESS NOTE  Ronald Ruiz TIR:[id number] DOB: [birth year]   PCP: [name], MD  Patient is from: Home  DOA: 06/23/2020 LOS: 30  Chief complaints: Shortness of breath  Brief Narrative / Interim history: 65 year old M with PMH of COPD, laryngeal cancer s/p right, resection and tracheostomy, oropharyngeal dysphagia with chronic aspiration and PEG dependence and substance use disorder on chronic methadone presenting with shortness of breath and admitted for severe sepsis and acute respiratory failure with hypoxia due to multifocal pneumonia.  He also had left second toe osteomyelitis, and underwent amputation.  Hospital course complicated by recurrent hypoglycemia on tube feed.  Subjective: Seen and examined earlier this morning.  No major events overnight of this morning.  Had hypoglycemia to 57 at 5 AM this morning.  Remains on D10 infusion.  Denies chest pain, dyspnea, GI or UTI symptoms.  [name] at bedside.  Objective: Vitals:   07/08/20 0746 07/08/20 0749 07/08/20 1146 07/08/20 1400  BP:    100/60  Pulse:    65  Resp:    15  Temp:    98.2 F (36.8 C)  TempSrc:    Oral  SpO2: 92% 92% 93% 97%  Weight:      Height:        Intake/Output Summary (Last 24 hours) at 07/08/2020 1426 Last data filed at 07/08/2020 1147 Gross per 24 hour  Intake 3322.75 ml  Output 5220 ml  Net -1897.25 ml   Filed Weights   07/03/20 0500 07/04/20 0500 07/06/20 0500  Weight: 53.8 kg 51.5 kg 51.8 kg    Examination:  GENERAL: Frail looking elderly male. HEENT: MMM.  Vision and hearing grossly intact.  NECK: Supple.  Tracheostomy. RESP:   No IWOB.  Fair aeration bilaterally. CVS:  RRR. Heart sounds normal.  ABD/GI/GU: BS+. Abd soft, NTND.  PEG tube in place. MSK/EXT:  Moves extremities.  Significant muscle mass and subcu fat loss. SKIN: no apparent skin lesion or wound NEURO: Awake, alert and oriented appropriately.  No apparent focal neuro deficit. PSYCH: Calm. Normal  affect.   Procedures:  06/29/2020-left second toe amputation by podiatry  Microbiology summarized: 2/1-COVID-19 PCR nonreactive. 2/3-C. difficile PCR nonreactive. 2/1-respiratory GS and culture with procidentia rettgeri resistant to Ancef, Amp, Unasyn and Zosyn but sensitive to cefepime, ceftazidime, Rocephin, ciprofloxacin, gentamicin, Bactrim. 2/1-blood cultures NGTD.   Assessment & Plan: Severe sepsis and acute respiratory failure with hypoxia secondary tomultifocal pneumonia: Was saturating at 87% on 10 L via trach collar on arrival.  Currently on room air. -Completed 10 days of antibiotic course with CTX and doxy, then Levaquin through 2/10. -Outpatient follow-up with pulmonology  Chronic COPD: Stable -Antibiotics as above -Continue Pulmicort, Brovana, yupelri as recommended by pulmonary.  -Pulmonary hygiene patient follow-up with pulm.  History of throat cancer s/p resection and tracheostomy Oropharyngeal dysphagia with chronic pulmonary aspiration now PEG dependent -Keep n.p.o.  -Continue stoma and PEG tube care.  -Appreciate help by dietitian  Anemia of chronic disease/iron deficiency anemia -Patient with no overt bleeding, hemoglobin trickling down likely due to dilutional effect. Anemia panel consistent with anemia of chronic disease/iron deficiency. Iron level at 13. Status post transfusion 1 unit packed red blood cells, IV Feraheme x1. Will likely need oral iron supplementation on discharge.   Left second toe comminuted fracture/osteomyelitis  S/p amputation with clean margins per podiatry on 2/7.   -Outpatient follow-up with podiatry.  Diarrhea: Resolved.  C. difficile PCR nonreactive. -Monitor  Chronic pain syndrome: On methadone outpatient. -Continue home methadone methadone and  as needed oxycodone.  Severe protein calorie malnutrition/recurrent hypoglycemia -Appreciate help by dietitian about his tube feed. -Change to D10 IV fluid -Check labs for  refeeding syndrome Body mass index is 15.49 kg/m. Nutrition Problem: Severe Malnutrition Etiology: chronic illness,cancer and cancer related treatments Signs/Symptoms: severe fat depletion,severe muscle depletion Interventions: Tube feeding,Prostat   Stage III coccygeal pressure skin injury: POA Pressure Injury 06/23/20 Coccyx Stage 3 -  Full thickness tissue loss. Subcutaneous fat may be visible but bone, tendon or muscle are NOT exposed. Quarter sized open area on coccyx, pink with yellow slough, previously documented as an open wound ([place] (Active)  06/23/20 (wound was assessed on admission but documented under wound, not pressure) 0339 (unknown exact time of first assessment)  Location: Coccyx  Location Orientation:   Staging: Stage 3 -  Full thickness tissue loss. Subcutaneous fat may be visible but bone, tendon or muscle are NOT exposed.  Wound Description (Comments): Quarter sized open area on coccyx, pink with yellow slough, previously documented as an open wound (documenting as a pressure injury due to location on bony prominence.  Present on Admission: Yes   DVT prophylaxis:  Place and maintain sequential compression device Start: 06/30/20 1610  Code Status: Full code Family Communication: Updated patient's daughter at bedside. Level of care: Med-Surg Status is: Inpatient  Remains inpatient appropriate because:IV treatments appropriate due to intensity of illness or inability to take PO and Inpatient level of care appropriate due to severity of illness   Dispo: The patient is from: Home              Anticipated d/c is to: Home              Anticipated d/c date is: 1 day              Patient currently is not medically stable to d/c.   Difficult to place patient No       Consultants:  PCCM Infectious disease Podiatry   Sch Meds:  Scheduled Meds: . arformoterol  15 mcg Nebulization BID  . budesonide  0.5 mg Nebulization BID  . feeding supplement (KATE FARMS  STANDARD 1.4)  650 mL Per Tube 5 X Daily  . free water  120 mL Per Tube 5 X Daily  . guaiFENesin  15 mL Per Tube Q6H  . loratadine  10 mg Per Tube Daily  . methadone  120 mg Per Tube Daily  . pantoprazole sodium  40 mg Per Tube Daily  . revefenacin  175 mcg Nebulization Daily   Continuous Infusions: . dextrose 50 mL/hr at 07/08/20 0546   PRN Meds:.acetaminophen (TYLENOL) oral liquid 160 mg/5 mL **OR** acetaminophen, albuterol, loperamide, ondansetron **OR** ondansetron (ZOFRAN) IV, oxyCODONE, senna-docusate  Antimicrobials: Anti-infectives (From admission, onward)   Start     Dose/Rate Route Frequency Ordered Stop   06/27/20 1700  levofloxacin (LEVAQUIN) 25 MG/ML solution 750 mg  Status:  Discontinued        750 mg Per Tube Daily 06/27/20 1437 07/02/20 1336   06/27/20 1230  cefdinir (OMNICEF) 125 MG/5ML suspension 300 mg  Status:  Discontinued        300 mg Per Tube 2 times daily 06/27/20 1140 06/27/20 1437   06/23/20 2200  cefTRIAXone (ROCEPHIN) 2 g in sodium chloride 0.9 % 100 mL IVPB  Status:  Discontinued        2 g 200 mL/hr over 30 Minutes Intravenous Every 24 hours 06/23/20 1926 06/27/20 1140   06/23/20 2200  doxycycline (VIBRA-TABS)  tablet 100 mg  Status:  Discontinued        100 mg Per Tube Every 12 hours 06/23/20 1941 06/27/20 1437   06/23/20 2000  azithromycin (ZITHROMAX) 500 mg in sodium chloride 0.9 % 250 mL IVPB  Status:  Discontinued        500 mg 250 mL/hr over 60 Minutes Intravenous Every 24 hours 06/23/20 1926 06/23/20 1938   06/23/20 1845  vancomycin (VANCOREADY) IVPB 1250 mg/250 mL        1,250 mg 166.7 mL/hr over 90 Minutes Intravenous  Once 06/23/20 1839 06/23/20 2055   06/23/20 1830  ceFEPIme (MAXIPIME) 2 g in sodium chloride 0.9 % 100 mL IVPB        2 g 200 mL/hr over 30 Minutes Intravenous  Once 06/23/20 1815 06/23/20 1920       I have personally reviewed the following labs and images: CBC: No results for input(s): WBC, NEUTROABS, HGB, HCT, MCV, PLT in  the last 168 hours. BMP &GFR No results for input(s): NA, K, CL, CO2, GLUCOSE, BUN, CREATININE, CALCIUM, MG, PHOS in the last 168 hours.  Invalid input(s):  GFRCG Estimated Creatinine Clearance: 68.3 mL/min (by C-G formula based on SCr of 0.7 mg/dL). Liver & Pancreas: No results for input(s): AST, ALT, ALKPHOS, BILITOT, PROT, ALBUMIN in the last 168 hours. No results for input(s): LIPASE, AMYLASE in the last 168 hours. No results for input(s): AMMONIA in the last 168 hours. Diabetic: No results for input(s): HGBA1C in the last 72 hours. Recent Labs  Lab 07/07/20 1807 07/07/20 2316 07/08/20 0533 07/08/20 0632 07/08/20 1130  GLUCAP 95 84 57* 96 83   Cardiac Enzymes: No results for input(s): CKTOTAL, CKMB, CKMBINDEX, TROPONINI in the last 168 hours. No results for input(s): PROBNP in the last 8760 hours. Coagulation Profile: No results for input(s): INR, PROTIME in the last 168 hours. Thyroid Function Tests: No results for input(s): TSH, T4TOTAL, FREET4, T3FREE, THYROIDAB in the last 72 hours. Lipid Profile: No results for input(s): CHOL, HDL, LDLCALC, TRIG, CHOLHDL, LDLDIRECT in the last 72 hours. Anemia Panel: No results for input(s): VITAMINB12, FOLATE, FERRITIN, TIBC, IRON, RETICCTPCT in the last 72 hours. Urine analysis:    Component Value Date/Time   COLORURINE YELLOW 07/02/2019 0827   APPEARANCEUR HAZY (A) 07/02/2019 0827   LABSPEC 1.014 07/02/2019 0827   PHURINE 5.0 07/02/2019 0827   GLUCOSEU NEGATIVE 07/02/2019 0827   HGBUR NEGATIVE 07/02/2019 0827   BILIRUBINUR NEGATIVE 07/02/2019 0827   KETONESUR NEGATIVE 07/02/2019 0827   PROTEINUR NEGATIVE 07/02/2019 0827   NITRITE NEGATIVE 07/02/2019 0827   LEUKOCYTESUR TRACE (A) 07/02/2019 0827   Sepsis Labs: Invalid input(s): PROCALCITONIN, [place]  Microbiology: Recent Results (from the past 240 hour(s))  Surgical pcr screen     Status: None   Collection Time: 06/28/20  6:30 PM   Specimen: Nasal Mucosa; Nasal  Swab  Result Value Ref Range Status   MRSA, PCR NEGATIVE NEGATIVE Final   Staphylococcus aureus NEGATIVE NEGATIVE Final    Comment: (NOTE) The Xpert SA Assay (FDA approved for NASAL specimens in patients 44 years of age and older), is one component of a comprehensive surveillance program. It is not intended to diagnose infection nor to guide or monitor treatment. Performed at [facility], [place] [address], [place], [place] [address]     Radiology Studies: No results found.    Lucia Harm T. [place]  If 7PM-7AM, please contact night-coverage [email or web address] 07/08/2020, 2:26 PM

## 2020-07-08 NOTE — Progress Notes (Signed)
Nutrition Follow-up  DOCUMENTATION CODES:   Severe malnutrition in context of chronic illness,Underweight  INTERVENTION:  - continue 2 cartons (650 ml) [employer] 1.4 x5/day with 60 ml water before and 60 ml water after each TF bolus.  - recommend K, Mg, and Phos be drawn 2/17 to rule out refeeding.    NUTRITION DIAGNOSIS:   Severe Malnutrition related to chronic illness,cancer and cancer related treatments as evidenced by severe fat depletion,severe muscle depletion. -ongoing  GOAL:   Patient will meet greater than or equal to 90% of their needs -met with TF regimen  MONITOR:   TF tolerance,Labs,Weight trends,Skin  REASON FOR ASSESSMENT:   Consult Other (Comment) (persistent hypoglycemia)  ASSESSMENT:   65 y.o. 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 He presented to the ED due to shortness of breath that began 2 days PTA and progressively worsened. EMS was called to the home and he was noted to be 80% on room air.  Patient sitting in chair and daughter at bedside. Patient with stoma from previous trach and able to cover this site to vocalize. He has PEG in place from PTA and remains NPO.   Patient is receiving bolus TF regimen of 2 cartons (650 ml) [employer] 1.4 x5/day with 60 ml water before and 60 ml water after each TF bolus. This regimen provides 4550 kcal, 200 grams protein, 510 grams carbohydrate, and 3540 ml free water  Patient denies feelings of hunger and denies any tolerance issues with current TF regimen. He and daughter are pleased with current regimen overall but concerned about persistent hypoglycemic events, which often occur at the very end of night shift (0500-0600 each day).    Able to communicate with MD about plans to adjust timing of TF  boluses and request to check serum K, Mg, and Phos d/t concern for possible refeeding due to significant increase in TF volume and grams of carb starting 2/12.    Labs reviewed; CBGs: 57 mg/dl at 0533, 96 mg/dl at 0632, 83 mg/dl at 1130.  Medications reviewed; 40 mg protonix/day.   IVF; D10 @ 50 ml/hr (408 kcal and 120 grams dextrose/24 hours) started 2/15 at 0800.   Diet Order:   Diet Order            Diet NPO time specified  Diet effective now                 EDUCATION NEEDS:   No education needs have been identified at this time  Skin:  Skin Assessment: Skin Integrity Issues: Skin Integrity Issues:: Incisions,Other (Comment),Stage III Stage III: coccyx (2/1) Incisions: L 2nd toe amputation (2/7) Other: MASD to perineum  Last BM:  2/14  Height:   Ht Readings from Last 1 Encounters:  06/30/20 6' (1.829 m)    Weight:   Wt Readings from Last 1 Encounters:  07/06/20 51.8 kg    Estimated Nutritional Needs:  Kcal:  2500-2700 kcal Protein:  120-135 grams Fluid:  >/= 2.5 L/day      Jarome Matin, MS, RD, LDN, CNSC Inpatient Clinical Dietitian RD pager # available in [place]  After hours/weekend pager # available in [facility]

## 2020-07-08 NOTE — Progress Notes (Signed)
Hypoglycemic Event  CBG:57  Treatment: tube feed bolus  Symptoms: none  Follow-up CBG: Time:06:35 CBG Result:96  Possible Reasons for Event: poor intake  Comments/MD notified: n/a    Nelida Meuse

## 2020-07-09 DIAGNOSIS — R5381 Other malaise: Secondary | ICD-10-CM

## 2020-07-09 DIAGNOSIS — J9601 Acute respiratory failure with hypoxia: Secondary | ICD-10-CM | POA: Diagnosis not present

## 2020-07-09 DIAGNOSIS — T17908A Unspecified foreign body in respiratory tract, part unspecified causing other injury, initial encounter: Secondary | ICD-10-CM | POA: Diagnosis not present

## 2020-07-09 DIAGNOSIS — A419 Sepsis, unspecified organism: Secondary | ICD-10-CM | POA: Diagnosis not present

## 2020-07-09 DIAGNOSIS — R1310 Dysphagia, unspecified: Secondary | ICD-10-CM | POA: Diagnosis not present

## 2020-07-09 LAB — RENAL FUNCTION PANEL
Albumin: 2.2 g/dL — ABNORMAL LOW (ref 3.5–5.0)
Anion gap: 10 (ref 5–15)
BUN: 34 mg/dL — ABNORMAL HIGH (ref 8–23)
CO2: 30 mmol/L (ref 22–32)
Calcium: 8.4 mg/dL — ABNORMAL LOW (ref 8.9–10.3)
Chloride: 100 mmol/L (ref 98–111)
Creatinine, Ser: 0.72 mg/dL (ref 0.61–1.24)
GFR, Estimated: >60 mL/min (ref >60)
Glucose, Bld: 98 mg/dL (ref 70–99)
Phosphorus: 3.4 mg/dL (ref 2.5–4.6)
Potassium: 4.5 mmol/L (ref 3.5–5.1)
Sodium: 140 mmol/L (ref 135–145)

## 2020-07-09 LAB — GLUCOSE, CAPILLARY
Glucose-Capillary: 76 mg/dL (ref 70–99)
Glucose-Capillary: 84 mg/dL (ref 70–99)
Glucose-Capillary: 85 mg/dL (ref 70–99)
Glucose-Capillary: 88 mg/dL (ref 70–99)

## 2020-07-09 LAB — CBC
HCT: 28.3 % — ABNORMAL LOW (ref 39.0–52.0)
Hemoglobin: 8.4 g/dL — ABNORMAL LOW (ref 13.0–17.0)
MCH: 27.7 pg (ref 26.0–34.0)
MCHC: 29.7 g/dL — ABNORMAL LOW (ref 30.0–36.0)
MCV: 93.4 fL (ref 80.0–100.0)
Platelets: 243 10*3/uL (ref 150–400)
RBC: 3.03 MIL/uL — ABNORMAL LOW (ref 4.22–5.81)
RDW: 19.7 % — ABNORMAL HIGH (ref 11.5–15.5)
WBC: 5.1 10*3/uL (ref 4.0–10.5)
nRBC: 0 % (ref 0.0–0.2)

## 2020-07-09 LAB — MAGNESIUM: Magnesium: 2 mg/dL (ref 1.7–2.4)

## 2020-07-09 NOTE — Progress Notes (Signed)
SATURATION QUALIFICATIONS: (This note is used to comply with regulatory documentation for home oxygen)  Patient Saturations on Room Air at Rest = 94%  Patient Saturations on Room Air while Ambulating = 87%  Patient Saturations on 2 Liters of oxygen while Ambulating = 91%  Please briefly explain why patient needs home oxygen:during ambulation patient had moments where his saturation would drop to as low as 87 while one room air.

## 2020-07-09 NOTE — Progress Notes (Signed)
PROGRESS NOTE  Foch Rosenwald RKY:[id number] DOB: 1956/03/23   PCP: [name], MD  Patient is from: Home  DOA: 06/23/2020 LOS: 51  Chief complaints: Shortness of breath  Brief Narrative / Interim history: 65 year old M with PMH of COPD, laryngeal cancer s/p right, resection and tracheostomy, oropharyngeal dysphagia with chronic aspiration and PEG dependence and substance use disorder on chronic methadone presenting with shortness of breath and admitted for severe sepsis and acute respiratory failure with hypoxia due to multifocal pneumonia.  He also had left second toe osteomyelitis, and underwent amputation.  Hospital course complicated by recurrent hypoglycemia on tube feed requiring D10 infusion.  Subjective: Seen and examined earlier this morning.  No major events overnight of this morning.  No episode of hypoglycemia overnight after adjusting tube feed yesterday.  However, he is a still on D10 infusion.  He denies chest pain, dyspnea, nausea, vomiting or abdominal pain.  Objective: Vitals:   07/09/20 0752 07/09/20 1248 07/09/20 1248 07/09/20 1353  BP:   (!) 90/50   Pulse:   61 (!) 102  Resp:   15   Temp:   98.4 F (36.9 C)   TempSrc:   Oral   SpO2: 96% 94% 90% 90%  Weight:      Height:        Intake/Output Summary (Last 24 hours) at 07/09/2020 1437 Last data filed at 07/09/2020 1040 Gross per 24 hour  Intake 2513.25 ml  Output 4800 ml  Net -2286.75 ml   Filed Weights   07/03/20 0500 07/04/20 0500 07/06/20 0500  Weight: 53.8 kg 51.5 kg 51.8 kg    Examination:  GENERAL: Frail looking elderly male. HEENT: MMM.  Vision and hearing grossly intact.  NECK: Supple.  Tracheostomy in place. RESP:  No IWOB.  Fair aeration bilaterally. CVS:  RRR. Heart sounds normal.  ABD/GI/GU: BS+. Abd soft, NTND.  PEG tube in place. MSK/EXT:  Moves extremities.  Significant muscle mass and subcu fat loss. SKIN: no apparent skin lesion or wound NEURO: Awake, alert and oriented  appropriately.  No apparent focal neuro deficit. PSYCH: Calm. Normal affect.  Procedures:  06/29/2020-left second toe amputation by podiatry  Microbiology summarized: 2/1-COVID-19 PCR nonreactive. 2/3-C. difficile PCR nonreactive. 2/1-respiratory GS and culture with procidentia rettgeri resistant to Ancef, Amp, Unasyn and Zosyn but sensitive to cefepime, ceftazidime, Rocephin, ciprofloxacin, gentamicin, Bactrim. 2/1-blood cultures NGTD.   Assessment & Plan: Severe sepsis and acute respiratory failure with hypoxia secondary tomultifocal pneumonia: Was saturating at 87% on 10 L via trach collar on arrival.  Currently on room air but desaturated to 87% with ambulation on room air requiring 2 L to recover to 91%. -Completed 10 days of antibiotic course with CTX and doxy, then Levaquin through 2/10. -Needs 2 L oxygen for home -Outpatient follow-up with pulmonology  Chronic COPD: Stable -Antibiotics as above -Continue Pulmicort, Brovana, yupelri as recommended by pulmonary.  -Pulmonary hygiene patient follow-up with pulm.  History of throat cancer s/p resection and tracheostomy Oropharyngeal dysphagia with chronic pulmonary aspiration now PEG dependent -Keep n.p.o.  -Continue stoma and PEG tube care.  -Appreciate help by dietitian  Anemia of chronic disease/iron deficiency anemia: H&H stable after 1 unit of PRBC and IV Feraheme. Recent Labs    06/24/20 0331 06/25/20 0316 06/25/20 1652 06/26/20 0322 06/27/20 0318 06/28/20 0325 06/29/20 0300 06/30/20 0325 07/01/20 0339 07/09/20 0320  HGB 8.5* 7.5* 8.0* 8.3* 7.4* 7.3* 9.2* 8.5* 9.3* 8.4*  -Monitor intermittently -Optimize nutrition  Left second toe comminuted fracture/osteomyelitis  S/p amputation  with clean margins per podiatry on 2/7.   -Outpatient follow-up with podiatry.  Diarrhea: Resolved.  C. difficile PCR nonreactive.  Chronic pain syndrome: On methadone outpatient. -Continue home methadone methadone and as  needed oxycodone.  Debility/physical deconditioning -Home health PT/OT, rolling walker and 3 in 1 commode ordered.  Severe protein calorie malnutrition/recurrent hypoglycemia -Appreciate help by dietitian about his tube feed.  No episode of hypoglycemia overnight -Discontinue D10 and monitor CBG over the next 24 hours. Body mass index is 15.49 kg/m. Nutrition Problem: Severe Malnutrition Etiology: chronic illness,cancer and cancer related treatments Signs/Symptoms: severe fat depletion,severe muscle depletion Interventions: Tube feeding,Prostat   Stage III coccygeal pressure skin injury: POA Pressure Injury 06/23/20 Coccyx Stage 3 -  Full thickness tissue loss. Subcutaneous fat may be visible but bone, tendon or muscle are NOT exposed. Quarter sized open area on coccyx, pink with yellow slough, previously documented as an open wound (documen (Active)  06/23/20 (wound was assessed on admission but documented under wound, not pressure) 0339 (unknown exact time of first assessment)  Location: Coccyx  Location Orientation:   Staging: Stage 3 -  Full thickness tissue loss. Subcutaneous fat may be visible but bone, tendon or muscle are NOT exposed.  Wound Description (Comments): Quarter sized open area on coccyx, pink with yellow slough, previously documented as an open wound (documenting as a pressure injury due to location on bony prominence.  Present on Admission: Yes   DVT prophylaxis:  Place and maintain sequential compression device Start: 06/30/20 8295  Code Status: Full code Family Communication: Updated patient's daughter at bedside on 2/16. Level of care: Med-Surg Status is: Inpatient  Remains inpatient appropriate because:Inpatient level of care appropriate due to severity of illness and Ensure stable CBG off IV dextrose infusion prior to discharge   Dispo: The patient is from: Home              Anticipated d/c is to: Home              Anticipated d/c date is: 1 day               Patient currently is not medically stable to d/c.   Difficult to place patient No       Consultants:  PCCM-signed off Infectious disease-signed off Podiatry-signed off   Sch Meds:  Scheduled Meds: . arformoterol  15 mcg Nebulization BID  . budesonide  0.5 mg Nebulization BID  . feeding supplement ([employer] STANDARD 1.4)  650 mL Per Tube 5 X Daily  . free water  120 mL Per Tube 5 X Daily  . guaiFENesin  15 mL Per Tube Q6H  . loratadine  10 mg Per Tube Daily  . methadone  120 mg Per Tube Daily  . pantoprazole sodium  40 mg Per Tube Daily  . revefenacin  175 mcg Nebulization Daily   Continuous Infusions:  PRN Meds:.acetaminophen (TYLENOL) oral liquid 160 mg/5 mL **OR** acetaminophen, albuterol, loperamide, ondansetron **OR** ondansetron (ZOFRAN) IV, oxyCODONE, senna-docusate  Antimicrobials: Anti-infectives (From admission, onward)   Start     Dose/Rate Route Frequency Ordered Stop   06/27/20 1700  levofloxacin (LEVAQUIN) 25 MG/ML solution 750 mg  Status:  Discontinued        750 mg Per Tube Daily 06/27/20 1437 07/02/20 1336   06/27/20 1230  cefdinir (OMNICEF) 125 MG/5ML suspension 300 mg  Status:  Discontinued        300 mg Per Tube 2 times daily 06/27/20 1140 06/27/20 1437   06/23/20  2200  cefTRIAXone (ROCEPHIN) 2 g in sodium chloride 0.9 % 100 mL IVPB  Status:  Discontinued        2 g 200 mL/hr over 30 Minutes Intravenous Every 24 hours 06/23/20 1926 06/27/20 1140   06/23/20 2200  doxycycline (VIBRA-TABS) tablet 100 mg  Status:  Discontinued        100 mg Per Tube Every 12 hours 06/23/20 1941 06/27/20 1437   06/23/20 2000  azithromycin (ZITHROMAX) 500 mg in sodium chloride 0.9 % 250 mL IVPB  Status:  Discontinued        500 mg 250 mL/hr over 60 Minutes Intravenous Every 24 hours 06/23/20 1926 06/23/20 1938   06/23/20 1845  vancomycin (VANCOREADY) IVPB 1250 mg/250 mL        1,250 mg 166.7 mL/hr over 90 Minutes Intravenous  Once 06/23/20 1839 06/23/20 2055    06/23/20 1830  ceFEPIme (MAXIPIME) 2 g in sodium chloride 0.9 % 100 mL IVPB        2 g 200 mL/hr over 30 Minutes Intravenous  Once 06/23/20 1815 06/23/20 1920       I have personally reviewed the following labs and images: CBC: Recent Labs  Lab 07/09/20 0320  WBC 5.1  HGB 8.4*  HCT 28.3*  MCV 93.4  PLT 243   BMP &GFR Recent Labs  Lab 07/09/20 0320  NA 140  K 4.5  CL 100  CO2 30  GLUCOSE 98  BUN 34*  CREATININE 0.72  CALCIUM 8.4*  MG 2.0  PHOS 3.4   Estimated Creatinine Clearance: 68.3 mL/min (by C-G formula based on SCr of 0.72 mg/dL). Liver & Pancreas: Recent Labs  Lab 07/09/20 0320  ALBUMIN 2.2*   No results for input(s): LIPASE, AMYLASE in the last 168 hours. No results for input(s): AMMONIA in the last 168 hours. Diabetic: No results for input(s): HGBA1C in the last 72 hours. Recent Labs  Lab 07/08/20 1130 07/08/20 1747 07/08/20 2344 07/09/20 0512 07/09/20 1113  GLUCAP 83 79 90 85 84   Cardiac Enzymes: No results for input(s): CKTOTAL, CKMB, CKMBINDEX, TROPONINI in the last 168 hours. No results for input(s): PROBNP in the last 8760 hours. Coagulation Profile: No results for input(s): INR, PROTIME in the last 168 hours. Thyroid Function Tests: No results for input(s): TSH, T4TOTAL, FREET4, T3FREE, THYROIDAB in the last 72 hours. Lipid Profile: No results for input(s): CHOL, HDL, LDLCALC, TRIG, CHOLHDL, LDLDIRECT in the last 72 hours. Anemia Panel: No results for input(s): VITAMINB12, FOLATE, FERRITIN, TIBC, IRON, RETICCTPCT in the last 72 hours. Urine analysis:    Component Value Date/Time   COLORURINE YELLOW 07/02/2019 0827   APPEARANCEUR HAZY (A) 07/02/2019 0827   LABSPEC 1.014 07/02/2019 0827   PHURINE 5.0 07/02/2019 0827   GLUCOSEU NEGATIVE 07/02/2019 0827   HGBUR NEGATIVE 07/02/2019 0827   BILIRUBINUR NEGATIVE 07/02/2019 0827   KETONESUR NEGATIVE 07/02/2019 0827   PROTEINUR NEGATIVE 07/02/2019 0827   NITRITE NEGATIVE 07/02/2019 0827    LEUKOCYTESUR TRACE (A) 07/02/2019 0827   Sepsis Labs: Invalid input(s): PROCALCITONIN, Flowella  Microbiology: No results found for this or any previous visit (from the past 240 hour(s)).  Radiology Studies: No results found.    Alaisha Eversley T. [place]  If 7PM-7AM, please contact night-coverage [email or web address] 07/09/2020, 2:37 PM

## 2020-07-09 NOTE — TOC Progression Note (Addendum)
Transition of Care [facility]) - Progression Note    Patient Details  Name: Ronald Ruiz MRN: [id number] Date of Birth: August 02, 1955  Transition of Care [facility] CM/SW [place], LCSW Phone Number: 07/09/2020, 11:49 AM  Clinical Narrative:    Patient anticipated discharge date is 2/18.  [facility] given oxygen orders for humidified oxygen, TF orders and DME orders for a 3 IN1 and a RW.   Patient will discharge home with home health services PT/OT/RN/Nurse aide, SW and Speech.  CSW confirm the Methadone center phsycian will see the patient tomorrow between 7am-9:30am otherwise the patient will not be able to see the physician again until next Tuesday. Daughter Amado Nash will transport him. CSW notified the physician and the nurse the patient will need to d/c before 9:00 am tomorrow.   -nurse to provide saturation screen for the oxygen order   Expected Discharge Plan: [place] Barriers to Discharge: Continued Medical Work up  Expected Discharge Plan and Services Expected Discharge Plan: [place] In-house Referral: Clinical Social Work Discharge Planning Services: CM Consult Post Acute Care Choice: [place] arrangements for the past 2 months: Single Family Home                 DME Arranged: Oxygen,3-N-1,Walker rolling,Tube feeding DME Agency: [facility] Date DME Agency Contacted: 07/07/20 Time DME Agency Contacted: [phone] Representative spoke with at DME Agency: [place] Arranged: Speech Therapy,PT,OT,Nurse's [place] Work [employer] Agency: [facility] [employer] [facility] [employer] Date [place]: 07/08/20 Time [place]: 1326 Representative spoke with at [place]: [place] ([place]) Interventions    Readmission Risk Interventions Readmission Risk Prevention Plan 07/04/2019  Transportation Screening Complete  Medication Review [employer]) Complete  PCP or  Specialist appointment within 3-5 days of discharge Complete  HRI or [place] Complete  SW Recovery Care/Counseling Consult Complete  [place] Not Applicable  Some recent data might be hidden

## 2020-07-10 ENCOUNTER — Telehealth: Payer: Self-pay | Admitting: *Deleted

## 2020-07-10 DIAGNOSIS — T17908A Unspecified foreign body in respiratory tract, part unspecified causing other injury, initial encounter: Secondary | ICD-10-CM | POA: Diagnosis not present

## 2020-07-10 DIAGNOSIS — A419 Sepsis, unspecified organism: Secondary | ICD-10-CM | POA: Diagnosis not present

## 2020-07-10 DIAGNOSIS — J9601 Acute respiratory failure with hypoxia: Secondary | ICD-10-CM | POA: Diagnosis not present

## 2020-07-10 DIAGNOSIS — J449 Chronic obstructive pulmonary disease, unspecified: Secondary | ICD-10-CM | POA: Diagnosis not present

## 2020-07-10 LAB — GLUCOSE, CAPILLARY: Glucose-Capillary: 92 mg/dL (ref 70–99)

## 2020-07-10 MED ORDER — LOPERAMIDE HCL 1 MG/7.5ML PO LIQD: 2.0000 mg | ORAL | 0 refills | Status: DC | PRN

## 2020-07-10 MED ORDER — PANTOPRAZOLE SODIUM 40 MG PO PACK: 40.0000 mg | PACK | Freq: Every day | ORAL | 2 refills | Status: AC

## 2020-07-10 MED ORDER — METHADONE HCL 10 MG/ML PO CONC: 120.0000 mg | Freq: Every day | ORAL | 0 refills | Status: DC

## 2020-07-10 MED ORDER — REVEFENACIN 175 MCG/3ML IN SOLN: 175.0000 ug | Freq: Every day | RESPIRATORY_TRACT | 1 refills | Status: DC

## 2020-07-10 MED ORDER — KATE FARMS STANDARD 1.4 PO LIQD: 650.0000 mL | Freq: Every day | ORAL | 0 refills | Status: DC

## 2020-07-10 NOTE — Discharge Summary (Signed)
Physician Discharge Summary  Ronald Ruiz RCV:[id number] DOB: 1956-02-29 DOA: 06/23/2020  PCP: Ronald Fraise, MD  Admit date: 06/23/2020 Discharge date: 07/10/2020  Admitted From: Home Disposition: Home  Recommendations for Outpatient Follow-up:  1. Follow ups as below. 2. Recommend ambulatory referral to palliative care 3. Please obtain CBC/BMP/Mag at follow up 4. Please follow up on the following pending results: None  Home Health: PT/OT/RN/SLP/CSW/Aide Equipment/Devices: 2 L oxygen, rolling walker and 3 in 1 commode  Discharge Condition: Stable CODE STATUS: Full code   Follow-up Information    Ronald Garfinkel, MD Follow up on 07/30/2020.   Specialty: Pulmonary Disease Why: Appt at 9:45 AM. Please arrive at 9:30 for check in.  Contact information: [address] [place] [address] [phone]        Ronald Fraise, MD. Schedule an appointment as soon as possible for a visit in 1 week(s).   Specialty: Family Medicine Contact information: [place] [address] [phone]        Ronald Ruiz, DPM. Schedule an appointment as soon as possible for a visit in 1 week(s).   Specialty: Podiatry Contact information: [address] [place] [address] [phone]        [facility] Follow up.   Specialty: [place] Why: This agency will provide home health services. The agency will contact you prior to the first visit.  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  He also had left second toe  osteomyelitis, and underwent amputation.  Hospital course complicated by recurrent hypoglycemia on tube feed requiring D10 infusion.  Hypoglycemia resolved after adjustment to his tube feed regimen.  He remained euglycemic off his D10 infusion for 24 hours prior to discharge.  He was evaluated by therapy who recommended SNF. However, TOC was not able to secure SNF after reaching out to a total of 41 SNF's across several counties in network with his insurance.  Eventually, patient and family decided to go home with home health and DME.  Patient was discharged early in the morning to follow up in methadone clinic.  Apparently, his clinic couldn't see him or work him in. Dr. Baird Ruiz from his methadone clinic reached out to me asking if we can send prescription for methadone to his pharmacy to last him through 07/17/2020.  I have electronically sent this prescription to his pharmacy. Dr. Baird Ruiz to call his daughter and let her know.    Discharge Diagnoses:  Severe sepsis and acute respiratory failure with hypoxia secondary tomultifocal pneumonia: Was saturating at 87% on 10 L via trach collar on arrival.  Currently on room air but desaturated to 87% with ambulation on room air requiring 2 L to recover to 91%. -Completed 10 days of antibiotic course with CTX and doxy, then Levaquin through 2/10. -Discharged on 2 L oxygen for home use  Chronic COPD: Stable -Continue Pulmicort and yupelri -Pulmonology follow-up as above.  History of throat Ruiz s/p resection and tracheostomy Oropharyngeal dysphagia with chronic pulmonary aspiration now PEG dependent -Continue stoma and PEG tube care.  Ronald Ruiz  as below.  Anemia of chronic disease/iron deficiency anemia: H&H stable after 1 unit of PRBC and IV Feraheme. Recent Labs (within last 365 days)              Recent Labs    06/24/20 0331 06/25/20 0316 06/25/20 1652 06/26/20 0322 06/27/20 0318 06/28/20 0325 06/29/20 0300 06/30/20 0325  07/01/20 0339 07/09/20 0320  HGB 8.5* 7.5* 8.0* 8.3* 7.4* 7.3* 9.2* 8.5* 9.3* 8.4*    -Recheck CBC at follow-up.  Left second toe comminuted fracture/osteomyelitis  S/p amputation with clean margins per podiatry on 2/7.   -Outpatient follow-up with podiatry in 1 to 2 weeks as above  Diarrhea: Resolved.  C. difficile PCR nonreactive.  Chronic pain syndrome: On methadone outpatient. -Electronically sent Rx for methadone 120 mg daily for 7 days per request by Dr. Baird Ruiz from his methadone clinic  Debility/physical deconditioning -Home health PT/OT, rolling walker and 3 in 1 commode ordered.  Severe protein calorie malnutrition/recurrent hypoglycemia -Hypoglycemia resolved after adjusting his tube feed.  Remained euglycemic without D10 infusion. Body mass index is 15.49 kg/m. Nutrition Problem: Severe Malnutrition Etiology: chronic illness,Ruiz and Ruiz related treatments Signs/Symptoms: severe fat depletion,severe muscle depletion Interventions: Tube feeding,Prostat  Stage III coccygeal pressure skin injury: POA Pressure Injury 06/23/20 Coccyx Stage 3 -  Full thickness tissue loss. Subcutaneous fat may be visible but bone, tendon or muscle are NOT exposed. Quarter sized open area on coccyx, pink with yellow slough, previously documented as an open wound (documen (Active)  06/23/20 (wound was assessed on admission but documented under wound, not pressure) 0339 (unknown exact time of first assessment)  Location: Coccyx  Location Orientation:   Staging: Stage 3 -  Full thickness tissue loss. Subcutaneous fat may be visible but bone, tendon or muscle are NOT exposed.  Wound Description (Comments): Quarter sized open area on coccyx, pink with yellow slough, previously documented as an open wound (documenting as a pressure injury due to location on bony prominence.  Present on Admission: Yes  -Dressing/wound care as below.  Discharge Exam: Vitals:   07/10/20 0800 07/10/20  0809  BP:    Pulse:    Resp:    Temp:    SpO2: 99% 96%   GENERAL: Frail looking elderly male.  No apparent distress. HEENT: MMM.  Vision and hearing grossly intact.  NECK: Supple.  Tracheostomy RESP: On 2 L.  No IWOB.  Fair aeration bilaterally. CVS:  RRR. Heart sounds normal.  ABD/GI/GU: Bowel sounds present. Soft. Non tender.  PEG tube in place. MSK/EXT:  Moves extremities.  Significant muscle mass and subcu fat loss. SKIN: Pressure skin injury as above.  Dressing and Ace wrap over left foot. NEURO: Awake, alert and oriented appropriately.  No apparent focal neuro deficit. PSYCH: Calm. Normal affect.  Discharge Instructions  Discharge Instructions    Call MD for:  difficulty breathing, headache or visual disturbances   Complete by: As directed    Call MD for:  extreme fatigue   Complete by: As directed    Call MD for:  persistant dizziness or light-headedness   Complete by: As directed    Call MD for:  redness, tenderness, or signs of infection (pain, swelling, redness, odor or green/yellow discharge around incision site)   Complete by: As directed    Call MD for:  severe uncontrolled pain   Complete by: As directed    Call MD for:  temperature >100.4   Complete by: As directed    Discharge instructions   Complete  by: As directed    It has been a pleasure taking care of you!  You were hospitalized due to pneumonia and left second toe infection.  You have had amputation of the left second toe.  You were treated with antibiotics for pneumonia.  Your symptoms improved to the point we think it is safe to let you go home and follow-up with your primary care doctor, pulmonology and podiatry in 1 to 2 weeks.  We have made adjustment to your home medications and tube feed during this hospitalization.  Please review your new medication list and the directions on your medications before you take them.   Take care,   Discharge wound care:   Complete by: As directed    Foam dressing  to coccyx, change Q 3 days or PRN soiling   Increase activity slowly   Complete by: As directed      Allergies as of 07/10/2020   No Known Allergies     Medication List    STOP taking these medications   free water Soln     TAKE these medications   albuterol 108 (90 Base) MCG/ACT inhaler Commonly known as: VENTOLIN HFA Inhale 1-2 puffs into the lungs every 6 (six) hours as needed for wheezing.   budesonide 0.5 MG/2ML nebulizer solution Commonly known as: PULMICORT INHALE CONTENTS OF 1 VIAL VIA NEBULIZER 2 TIMES DAILY What changed: See the new instructions.   ipratropium-albuterol 0.5-2.5 (3) MG/3ML Soln Commonly known as: DUONEB TAKE 3 MLS BY NEBULIZATION EVERY 6 (SIX) HOURS AS NEEDED. What changed: reasons to take this   Loperamide HCl 1 MG/7.5ML Liqd Commonly known as: [facility] Loperamide HCl Place 15 mLs (2 mg total) into feeding tube every 4 (four) hours as needed (diarrhea).   methadone 10 MG/ML solution Commonly known as: DOLOPHINE Place 12 mLs (120 mg total) into feeding tube daily for 7 days.   pantoprazole sodium 40 mg/20 mL Pack Commonly known as: PROTONIX Place 20 mLs (40 mg total) into feeding tube daily.   ProMod Liqd Give 30 mL Promod twice daily via PEG. What changed: Another medication with the same name was changed. Make sure you understand how and when to take each.   feeding supplement (KATE FARMS STANDARD 1.4) Liqd liquid Place 650 mLs into feeding tube 5 (five) times daily. What changed:   how much to take  how to take this  when to take this  additional instructions   revefenacin 175 MCG/3ML nebulizer solution Commonly known as: YUPELRI Take 3 mLs (175 mcg total) by nebulization daily.            Durable Medical Equipment  (From admission, onward)         Start     Ordered   07/09/20 1424  For home use only DME Walker rolling  Once       Question Answer Comment  Walker: With 5 Inch Wheels   Patient needs a walker to treat  with the following condition Generalized weakness      07/09/20 1424   07/09/20 1424  For home use only DME Bedside commode  Once       Question:  Patient needs a bedside commode to treat with the following condition  Answer:  Generalized weakness   07/09/20 1424   07/07/20 1525  For home use only DME oxygen  Once       Question Answer Comment  Length of Need Lifetime   Mode or (Route) Mask   Liters per  Minute 2   Frequency Continuous (stationary and portable oxygen unit needed)   Oxygen conserving device Yes   Oxygen delivery system Gas      07/07/20 1525           Discharge Care Instructions  (From admission, onward)         Start     Ordered   07/10/20 0000  Discharge wound care:       Comments: Foam dressing to coccyx, change Q 3 days or PRN soiling   07/10/20 0710          Consultations:  PCCM  Podiatry  Procedures/Studies:  06/29/2020-left second toe amputation by podiatry   DG Chest Portable 1 View  Result Date: 06/23/2020 CLINICAL DATA:  Cough.  Shortness of breath. EXAM: PORTABLE CHEST 1 VIEW COMPARISON:  July 02, 2019. FINDINGS: The heart size and mediastinal contours are within normal limits. No pneumothorax is noted. Increased bilateral lung opacities are noted concerning for multifocal pneumonia. Small pleural effusions may be present. The visualized skeletal structures are unremarkable. IMPRESSION: Increased bilateral lung opacities are noted concerning for multifocal pneumonia. Electronically Signed   By: Marijo Conception M.D.   On: 06/23/2020 15:40   DG Foot Complete Left  Result Date: 06/29/2020 CLINICAL DATA:  Status post second toe amputation. EXAM: LEFT FOOT - COMPLETE 3+ VIEW COMPARISON:  June 26, 2020. FINDINGS: Status post amputation of the of phalanges of the second toe. Expected postsurgical findings are seen in the soft tissues. Hallux valgus deformity of the first metatarsophalangeal joint is noted with mild osteophyte formation. No  other fracture or bony abnormality is noted. IMPRESSION: Status post amputation of phalanges of the second toe. No other significant abnormality is noted. Electronically Signed   By: Marijo Conception M.D.   On: 06/29/2020 08:49   DG Foot Complete Left  Result Date: 06/26/2020 CLINICAL DATA:  65 year old male status post reduction of second toe fracture. EXAM: LEFT FOOT - COMPLETE 3+ VIEW COMPARISON:  Earlier radiograph dated 06/25/2020. FINDINGS: Persistent displaced fracture of the proximal phalanx of the second toe with probable impaction. Apparent cortical irregularity of the base of the proximal phalanx of the third toe, age indeterminate. There is no dislocation. Mild osteopenia. There is cortical irregularity of the head of the first metatarsal and mild hallux valgus. The soft tissues are unremarkable. IMPRESSION: 1. Persistent displaced fracture of the proximal phalanx of the second toe. 2. Cortical irregularity of the base of the proximal phalanx of the third toe, age indeterminate. Electronically Signed   By: Anner Crete M.D.   On: 06/26/2020 19:13   DG Toe 2nd Left  Result Date: 06/25/2020 CLINICAL DATA:  Left second toe pain. EXAM: LEFT SECOND TOE COMPARISON:  None. FINDINGS: Comminuted fracture is seen involving the distal portion of the second proximal phalanx. There is severe dislocation of the second middle phalanx relative to the second proximal phalanx. IMPRESSION: Comminuted fracture is seen involving the distal portion of the second proximal phalanx with severe dislocation of the second middle phalanx relative to the second proximal phalanx. Electronically Signed   By: Marijo Conception M.D.   On: 06/25/2020 15:28   VAS [place] ABI WITH/WO TBI  Result Date: 06/29/2020 LOWER EXTREMITY DOPPLER STUDY Indications: Peripheral artery disease, and S/P LLE 2nd toe amputation -              evaluate blood flow. High Risk Factors: Past history of smoking. Other Factors: COPD, HX throat CA (s/p  tracheostomy & PEG tube placement for                dysphagia).  Comparison Study: No previous exams Performing Technologist: Hill, Jody RVT, RDMS  Examination Guidelines: A complete evaluation includes at minimum, Doppler waveform signals and systolic blood pressure reading at the level of bilateral brachial, anterior tibial, and posterior tibial arteries, when vessel segments are accessible. Bilateral testing is considered an integral part of a complete examination. Photoelectric Plethysmograph (PPG) waveforms and toe systolic pressure readings are included as required and additional duplex testing as needed. Limited examinations for reoccurring indications may be performed as noted.  ABI Findings: +--------+------------------+-----+----------+--------+ Right   Rt Pressure (mmHg)IndexWaveform  Comment  +--------+------------------+-----+----------+--------+ Brachial90                     biphasic           +--------+------------------+-----+----------+--------+ PTA     56                0.62 monophasic         +--------+------------------+-----+----------+--------+ DP      54                0.60 monophasic         +--------+------------------+-----+----------+--------+ +--------+------------------+-----+----------+---------------+ Left    Lt Pressure (mmHg)IndexWaveform  Comment         +--------+------------------+-----+----------+---------------+ Brachial                       biphasic  IV in upper arm +--------+------------------+-----+----------+---------------+ PTA     68                0.76 monophasic                +--------+------------------+-----+----------+---------------+ DP      61                0.68 monophasic                +--------+------------------+-----+----------+---------------+  Summary: Right: Resting right ankle-brachial index indicates moderate right lower extremity arterial disease. Left: Resting left ankle-brachial index indicates  moderate left lower extremity arterial disease.  *See table(s) above for measurements and observations.  Electronically signed by Jamelle Haring on 06/29/2020 at 5:06:02 PM.   Final        The results of significant diagnostics from this hospitalization (including imaging, microbiology, ancillary and laboratory) are listed below for reference.     Microbiology: No results found for this or any previous visit (from the past 240 hour(s)).   Labs:  CBC: Recent Labs  Lab 07/09/20 0320  WBC 5.1  HGB 8.4*  HCT 28.3*  MCV 93.4  PLT 243   BMP &GFR Recent Labs  Lab 07/09/20 0320  NA 140  K 4.5  CL 100  CO2 30  GLUCOSE 98  BUN 34*  CREATININE 0.72  CALCIUM 8.4*  MG 2.0  PHOS 3.4   Estimated Creatinine Clearance: 68.3 mL/min (by C-G formula based on SCr of 0.72 mg/dL). Liver & Pancreas: Recent Labs  Lab 07/09/20 0320  ALBUMIN 2.2*   No results for input(s): LIPASE, AMYLASE in the last 168 hours. No results for input(s): AMMONIA in the last 168 hours. Diabetic: No results for input(s): HGBA1C in the last 72 hours. Recent Labs  Lab 07/09/20 0512 07/09/20 1113 07/09/20 1831 07/09/20 2350 07/10/20 0557  GLUCAP 85 84 88 76 92   Cardiac Enzymes: No results for input(s): CKTOTAL,  CKMB, CKMBINDEX, TROPONINI in the last 168 hours. No results for input(s): PROBNP in the last 8760 hours. Coagulation Profile: No results for input(s): INR, PROTIME in the last 168 hours. Thyroid Function Tests: No results for input(s): TSH, T4TOTAL, FREET4, T3FREE, THYROIDAB in the last 72 hours. Lipid Profile: No results for input(s): CHOL, HDL, LDLCALC, TRIG, CHOLHDL, LDLDIRECT in the last 72 hours. Anemia Panel: No results for input(s): VITAMINB12, FOLATE, FERRITIN, TIBC, IRON, RETICCTPCT in the last 72 hours. Urine analysis:    Component Value Date/Time   COLORURINE YELLOW 07/02/2019 0827   APPEARANCEUR HAZY (A) 07/02/2019 0827   LABSPEC 1.014 07/02/2019 0827   PHURINE 5.0  07/02/2019 0827   GLUCOSEU NEGATIVE 07/02/2019 0827   HGBUR NEGATIVE 07/02/2019 0827   BILIRUBINUR NEGATIVE 07/02/2019 0827   KETONESUR NEGATIVE 07/02/2019 0827   PROTEINUR NEGATIVE 07/02/2019 0827   NITRITE NEGATIVE 07/02/2019 0827   LEUKOCYTESUR TRACE (A) 07/02/2019 0827   Sepsis Labs: Invalid input(s): PROCALCITONIN, LACTICIDVEN   Time coordinating discharge: 45 minutes  SIGNED:  Mercy Riding, MD  [facility] 07/10/2020, 1:37 PM  If 7PM-7AM, please contact night-coverage [email or web address]

## 2020-07-10 NOTE — Telephone Encounter (Addendum)
07/10/2020  This patient was discharged from [facility] today. He had been admitted for 17 days with acute hypoxic resp failure/pneumona. Recommendation was for SNF, but no facility would accept him because he takes methadone. Dr Livia Snellen is listed as PCP but he hasn't been seen since 06/14/2018. Unsure if he has another PCP or just hasn't been following up.   Forwarding to [facility] clinical staff for [facility] outreach and to schedule hospital f/u with PCP if still a Patient at [facility]. Place CCM referral if appropriate.   Chong Sicilian, BSN, RN-BC Embedded Chronic Care Manager [facility] [place] Management Direct Dial: [phone]

## 2020-07-10 NOTE — Plan of Care (Signed)

## 2020-07-10 NOTE — Telephone Encounter (Signed)
Transition Care Management Unsuccessful Follow-up Telephone Call  Date of discharge and from where:  07/10/20 from [facility]  Attempts:  1st Attempt  Reason for unsuccessful TCM follow-up call:  Left voice message

## 2020-07-11 ENCOUNTER — Other Ambulatory Visit: Payer: Self-pay | Admitting: Student

## 2020-07-11 MED ORDER — METHADONE HCL 10 MG/ML PO CONC
120.0000 mg | Freq: Every day | ORAL | 0 refills | Status: DC
Start: 2020-07-11 — End: 2020-07-14

## 2020-07-11 MED FILL — METHADONE INTENSOL 10 MG/ML: 10 | 7 days supply | Qty: 84 | Fill #0

## 2020-07-11 NOTE — Progress Notes (Signed)
[facility] [place] [facility].  [name] called [facility]. Harriet Pho, RPH reached out to me through secure chart asking if I can send prescription to [place]. [facility] [place] and asked [facility]. Rohini to cancel the prescription.  I sent a new prescription [facility].

## 2020-07-13 NOTE — Telephone Encounter (Signed)
Transition Care Management Unsuccessful Follow-up Telephone Call  Date of discharge and from where:  07/10/2020 [facility]   Attempts:  2nd Attempt  Reason for unsuccessful TCM follow-up call:  Left voice message

## 2020-07-14 ENCOUNTER — Other Ambulatory Visit: Payer: Self-pay | Admitting: Student

## 2020-07-14 MED ORDER — METHADONE HCL 10 MG/ML PO CONC: 120.0000 mg | Freq: Every day | ORAL | 0 refills | Status: AC

## 2020-07-14 NOTE — Progress Notes (Unsigned)
Notified by pharmacy that the medication comes in bottle of 30 mls which makes it difficult to dispense 84 limbs. mls. Changed Rx to 60 mls (5 days worth) since he has upcoming apt with his pain clinic in 4 days.

## 2020-07-14 NOTE — Telephone Encounter (Signed)
Transition Care Management Unsuccessful Follow-up Telephone Call  Date of discharge and from where:  07/10/20, [facility]  Attempts:  3rd Attempt  Reason for unsuccessful TCM follow-up call:  Left voice message

## 2020-07-17 ENCOUNTER — Ambulatory Visit: Payer: 59 | Admitting: Neurology

## 2020-07-24 ENCOUNTER — Other Ambulatory Visit: Payer: Self-pay | Admitting: *Deleted

## 2020-07-24 ENCOUNTER — Ambulatory Visit (INDEPENDENT_AMBULATORY_CARE_PROVIDER_SITE_OTHER): Payer: 59

## 2020-07-24 ENCOUNTER — Other Ambulatory Visit: Payer: Self-pay

## 2020-07-24 DIAGNOSIS — Z89422 Acquired absence of other left toe(s): Secondary | ICD-10-CM

## 2020-07-24 DIAGNOSIS — Z8521 Personal history of malignant neoplasm of larynx: Secondary | ICD-10-CM

## 2020-07-24 DIAGNOSIS — J9601 Acute respiratory failure with hypoxia: Secondary | ICD-10-CM

## 2020-07-24 DIAGNOSIS — L89153 Pressure ulcer of sacral region, stage 3: Secondary | ICD-10-CM | POA: Diagnosis not present

## 2020-07-24 DIAGNOSIS — R1312 Dysphagia, oropharyngeal phase: Secondary | ICD-10-CM

## 2020-07-24 DIAGNOSIS — J189 Pneumonia, unspecified organism: Secondary | ICD-10-CM | POA: Diagnosis not present

## 2020-07-24 DIAGNOSIS — M84478D Pathological fracture, left toe(s), subsequent encounter for fracture with routine healing: Secondary | ICD-10-CM

## 2020-07-24 DIAGNOSIS — G894 Chronic pain syndrome: Secondary | ICD-10-CM

## 2020-07-24 DIAGNOSIS — A419 Sepsis, unspecified organism: Secondary | ICD-10-CM

## 2020-07-24 DIAGNOSIS — Z9981 Dependence on supplemental oxygen: Secondary | ICD-10-CM

## 2020-07-24 DIAGNOSIS — R918 Other nonspecific abnormal finding of lung field: Secondary | ICD-10-CM

## 2020-07-24 DIAGNOSIS — D509 Iron deficiency anemia, unspecified: Secondary | ICD-10-CM

## 2020-07-24 DIAGNOSIS — C320 Malignant neoplasm of glottis: Secondary | ICD-10-CM

## 2020-07-24 DIAGNOSIS — J440 Chronic obstructive pulmonary disease with acute lower respiratory infection: Secondary | ICD-10-CM

## 2020-07-24 DIAGNOSIS — Z87891 Personal history of nicotine dependence: Secondary | ICD-10-CM

## 2020-07-24 DIAGNOSIS — M869 Osteomyelitis, unspecified: Secondary | ICD-10-CM

## 2020-07-24 DIAGNOSIS — Z7951 Long term (current) use of inhaled steroids: Secondary | ICD-10-CM

## 2020-07-24 DIAGNOSIS — E43 Unspecified severe protein-calorie malnutrition: Secondary | ICD-10-CM

## 2020-07-27 ENCOUNTER — Encounter (HOSPITAL_COMMUNITY): Payer: Self-pay

## 2020-07-27 ENCOUNTER — Ambulatory Visit: Payer: 59 | Admitting: Podiatry

## 2020-07-27 ENCOUNTER — Inpatient Hospital Stay (HOSPITAL_COMMUNITY)
Admission: EM | Admit: 2020-07-27 | Discharge: 2020-07-31 | DRG: 177 | Disposition: A | Discharge status: Home Health | Payer: 59 | Attending: Internal Medicine | Admitting: Internal Medicine

## 2020-07-27 ENCOUNTER — Emergency Department (HOSPITAL_COMMUNITY): Payer: 59

## 2020-07-27 DIAGNOSIS — T148XXA Other injury of unspecified body region, initial encounter: Secondary | ICD-10-CM

## 2020-07-27 DIAGNOSIS — R1310 Dysphagia, unspecified: Secondary | ICD-10-CM

## 2020-07-27 DIAGNOSIS — R64 Cachexia: Secondary | ICD-10-CM | POA: Diagnosis present

## 2020-07-27 DIAGNOSIS — R918 Other nonspecific abnormal finding of lung field: Secondary | ICD-10-CM | POA: Diagnosis present

## 2020-07-27 DIAGNOSIS — Z923 Personal history of irradiation: Secondary | ICD-10-CM

## 2020-07-27 DIAGNOSIS — L03116 Cellulitis of left lower limb: Secondary | ICD-10-CM | POA: Diagnosis present

## 2020-07-27 DIAGNOSIS — E162 Hypoglycemia, unspecified: Secondary | ICD-10-CM | POA: Diagnosis not present

## 2020-07-27 DIAGNOSIS — J9622 Acute and chronic respiratory failure with hypercapnia: Secondary | ICD-10-CM | POA: Diagnosis present

## 2020-07-27 DIAGNOSIS — L89159 Pressure ulcer of sacral region, unspecified stage: Secondary | ICD-10-CM | POA: Diagnosis present

## 2020-07-27 DIAGNOSIS — R001 Bradycardia, unspecified: Secondary | ICD-10-CM | POA: Diagnosis not present

## 2020-07-27 DIAGNOSIS — Z931 Gastrostomy status: Secondary | ICD-10-CM | POA: Diagnosis not present

## 2020-07-27 DIAGNOSIS — J189 Pneumonia, unspecified organism: Secondary | ICD-10-CM

## 2020-07-27 DIAGNOSIS — D638 Anemia in other chronic diseases classified elsewhere: Secondary | ICD-10-CM | POA: Diagnosis present

## 2020-07-27 DIAGNOSIS — J449 Chronic obstructive pulmonary disease, unspecified: Secondary | ICD-10-CM | POA: Diagnosis present

## 2020-07-27 DIAGNOSIS — E43 Unspecified severe protein-calorie malnutrition: Secondary | ICD-10-CM | POA: Diagnosis present

## 2020-07-27 DIAGNOSIS — Z20822 Contact with and (suspected) exposure to covid-19: Secondary | ICD-10-CM | POA: Diagnosis present

## 2020-07-27 DIAGNOSIS — C320 Malignant neoplasm of glottis: Secondary | ICD-10-CM | POA: Diagnosis present

## 2020-07-27 DIAGNOSIS — G894 Chronic pain syndrome: Secondary | ICD-10-CM | POA: Diagnosis present

## 2020-07-27 DIAGNOSIS — L97529 Non-pressure chronic ulcer of other part of left foot with unspecified severity: Secondary | ICD-10-CM | POA: Diagnosis present

## 2020-07-27 DIAGNOSIS — J9601 Acute respiratory failure with hypoxia: Secondary | ICD-10-CM | POA: Diagnosis not present

## 2020-07-27 DIAGNOSIS — J690 Pneumonitis due to inhalation of food and vomit: Principal | ICD-10-CM | POA: Diagnosis present

## 2020-07-27 DIAGNOSIS — Z7951 Long term (current) use of inhaled steroids: Secondary | ICD-10-CM

## 2020-07-27 DIAGNOSIS — R1312 Dysphagia, oropharyngeal phase: Secondary | ICD-10-CM | POA: Diagnosis present

## 2020-07-27 DIAGNOSIS — Z9981 Dependence on supplemental oxygen: Secondary | ICD-10-CM

## 2020-07-27 DIAGNOSIS — Z681 Body mass index (BMI) 19 or less, adult: Secondary | ICD-10-CM | POA: Diagnosis not present

## 2020-07-27 DIAGNOSIS — R911 Solitary pulmonary nodule: Secondary | ICD-10-CM | POA: Diagnosis present

## 2020-07-27 DIAGNOSIS — R54 Age-related physical debility: Secondary | ICD-10-CM | POA: Diagnosis present

## 2020-07-27 DIAGNOSIS — Z8701 Personal history of pneumonia (recurrent): Secondary | ICD-10-CM | POA: Diagnosis not present

## 2020-07-27 DIAGNOSIS — Y95 Nosocomial condition: Secondary | ICD-10-CM | POA: Diagnosis present

## 2020-07-27 DIAGNOSIS — Z8521 Personal history of malignant neoplasm of larynx: Secondary | ICD-10-CM | POA: Diagnosis not present

## 2020-07-27 DIAGNOSIS — T17908A Unspecified foreign body in respiratory tract, part unspecified causing other injury, initial encounter: Secondary | ICD-10-CM | POA: Diagnosis present

## 2020-07-27 DIAGNOSIS — J441 Chronic obstructive pulmonary disease with (acute) exacerbation: Secondary | ICD-10-CM | POA: Diagnosis present

## 2020-07-27 DIAGNOSIS — Z85819 Personal history of malignant neoplasm of unspecified site of lip, oral cavity, and pharynx: Secondary | ICD-10-CM | POA: Diagnosis not present

## 2020-07-27 DIAGNOSIS — Z87891 Personal history of nicotine dependence: Secondary | ICD-10-CM

## 2020-07-27 DIAGNOSIS — E860 Dehydration: Secondary | ICD-10-CM | POA: Diagnosis present

## 2020-07-27 DIAGNOSIS — Z89422 Acquired absence of other left toe(s): Secondary | ICD-10-CM | POA: Diagnosis not present

## 2020-07-27 DIAGNOSIS — L97521 Non-pressure chronic ulcer of other part of left foot limited to breakdown of skin: Secondary | ICD-10-CM | POA: Diagnosis not present

## 2020-07-27 DIAGNOSIS — Z7189 Other specified counseling: Secondary | ICD-10-CM | POA: Diagnosis not present

## 2020-07-27 DIAGNOSIS — L8990 Pressure ulcer of unspecified site, unspecified stage: Secondary | ICD-10-CM | POA: Diagnosis present

## 2020-07-27 DIAGNOSIS — Z515 Encounter for palliative care: Secondary | ICD-10-CM | POA: Diagnosis not present

## 2020-07-27 DIAGNOSIS — L89303 Pressure ulcer of unspecified buttock, stage 3: Secondary | ICD-10-CM | POA: Diagnosis not present

## 2020-07-27 DIAGNOSIS — J9621 Acute and chronic respiratory failure with hypoxia: Secondary | ICD-10-CM | POA: Diagnosis not present

## 2020-07-27 DIAGNOSIS — Z79899 Other long term (current) drug therapy: Secondary | ICD-10-CM

## 2020-07-27 DIAGNOSIS — Z79891 Long term (current) use of opiate analgesic: Secondary | ICD-10-CM

## 2020-07-27 LAB — COMPREHENSIVE METABOLIC PANEL
ALT: 12 U/L (ref 0–44)
AST: 17 U/L (ref 15–41)
Albumin: 2.6 g/dL — ABNORMAL LOW (ref 3.5–5.0)
Alkaline Phosphatase: 84 U/L (ref 38–126)
Anion gap: 8 (ref 5–15)
BUN: 28 mg/dL — ABNORMAL HIGH (ref 8–23)
CO2: 32 mmol/L (ref 22–32)
Calcium: 9.1 mg/dL (ref 8.9–10.3)
Chloride: 98 mmol/L (ref 98–111)
Creatinine, Ser: 0.88 mg/dL (ref 0.61–1.24)
GFR, Estimated: >60 mL/min (ref >60)
Glucose, Bld: 83 mg/dL (ref 70–99)
Potassium: 4.4 mmol/L (ref 3.5–5.1)
Sodium: 138 mmol/L (ref 135–145)
Total Bilirubin: 0.4 mg/dL (ref 0.3–1.2)
Total Protein: 7.4 g/dL (ref 6.5–8.1)

## 2020-07-27 LAB — BLOOD GAS, VENOUS
Acid-Base Excess: 6 mmol/L — ABNORMAL HIGH (ref 0.0–2.0)
Bicarbonate: 32.5 mmol/L — ABNORMAL HIGH (ref 20.0–28.0)
O2 Saturation: 31.7 %
Patient temperature: 98.6
pCO2, Ven: 60.3 mmHg — ABNORMAL HIGH (ref 44.0–60.0)
pH, Ven: 7.351 (ref 7.250–7.430)
pO2, Ven: 24.8 mmHg — CL (ref 32.0–45.0)

## 2020-07-27 LAB — CBC WITH DIFFERENTIAL/PLATELET
Abs Immature Granulocytes: 0.11 10*3/uL — ABNORMAL HIGH (ref 0.00–0.07)
Basophils Absolute: 0 10*3/uL (ref 0.0–0.1)
Basophils Relative: 0 %
Eosinophils Absolute: 0 10*3/uL (ref 0.0–0.5)
Eosinophils Relative: 0 %
HCT: 36.2 % — ABNORMAL LOW (ref 39.0–52.0)
Hemoglobin: 10.8 g/dL — ABNORMAL LOW (ref 13.0–17.0)
Immature Granulocytes: 1 %
Lymphocytes Relative: 4 %
Lymphs Abs: 0.7 10*3/uL (ref 0.7–4.0)
MCH: 27.3 pg (ref 26.0–34.0)
MCHC: 29.8 g/dL — ABNORMAL LOW (ref 30.0–36.0)
MCV: 91.6 fL (ref 80.0–100.0)
Monocytes Absolute: 0.8 10*3/uL (ref 0.1–1.0)
Monocytes Relative: 4 %
Neutro Abs: 17.5 10*3/uL — ABNORMAL HIGH (ref 1.7–7.7)
Neutrophils Relative %: 91 %
Platelets: 293 10*3/uL (ref 150–400)
RBC: 3.95 MIL/uL — ABNORMAL LOW (ref 4.22–5.81)
RDW: 19.3 % — ABNORMAL HIGH (ref 11.5–15.5)
WBC: 19.1 10*3/uL — ABNORMAL HIGH (ref 4.0–10.5)
nRBC: 0 % (ref 0.0–0.2)

## 2020-07-27 LAB — TROPONIN I (HIGH SENSITIVITY)
Troponin I (High Sensitivity): 8 ng/L (ref <18)
Troponin I (High Sensitivity): 9 ng/L (ref <18)

## 2020-07-27 LAB — RESP PANEL BY RT-PCR (FLU A&B, COVID) ARPGX2
Influenza A by PCR: NEGATIVE
Influenza B by PCR: NEGATIVE
SARS Coronavirus 2 by RT PCR: NEGATIVE

## 2020-07-27 LAB — BRAIN NATRIURETIC PEPTIDE: B Natriuretic Peptide: 113.6 pg/mL — ABNORMAL HIGH (ref 0.0–100.0)

## 2020-07-27 LAB — LACTIC ACID, PLASMA
Lactic Acid, Venous: 1.2 mmol/L (ref 0.5–1.9)
Lactic Acid, Venous: 0.8 mmol/L (ref 0.5–1.9)

## 2020-07-27 LAB — PROCALCITONIN: Procalcitonin: 0.19 ng/mL

## 2020-07-27 MED ORDER — ALBUTEROL SULFATE HFA 108 (90 BASE) MCG/ACT IN AERS
4.0000 | INHALATION_SPRAY | Freq: Once | RESPIRATORY_TRACT | Status: AC
  Administered 2020-07-27: 4 via RESPIRATORY_TRACT
  Filled 2020-07-27: qty 6.7

## 2020-07-27 MED ORDER — IOHEXOL 350 MG/ML SOLN
100.0000 mL | Freq: Once | INTRAVENOUS | Status: AC | PRN
  Administered 2020-07-27: 100 mL via INTRAVENOUS

## 2020-07-27 MED ORDER — VANCOMYCIN HCL IN DEXTROSE 1-5 GM/200ML-% IV SOLN
1000.0000 mg | Freq: Once | INTRAVENOUS | Status: AC
  Administered 2020-07-27: 1000 mg via INTRAVENOUS
  Filled 2020-07-27: qty 200

## 2020-07-27 MED ORDER — PIPERACILLIN-TAZOBACTAM 3.375 G IVPB
3.3750 g | Freq: Three times a day (TID) | INTRAVENOUS | Status: DC
  Administered 2020-07-28: 3.375 g via INTRAVENOUS
  Filled 2020-07-27 (×2): qty 50

## 2020-07-27 MED ORDER — SODIUM CHLORIDE 0.9 % IV SOLN
2.0000 g | Freq: Once | INTRAVENOUS | Status: AC
  Administered 2020-07-27: 2 g via INTRAVENOUS
  Filled 2020-07-27: qty 2

## 2020-07-27 NOTE — ED Notes (Signed)
Pt has a feeding tube in place, lower abdomen midline, pt has a stoma to the front of his throat.

## 2020-07-27 NOTE — ED Triage Notes (Signed)
Pt BIB EMS  Per EMS pt has been having shortness of breath since this morning. Since coming from the hospital Feb 18, he has been on 2L/min per nasal cannula. He turned it up to 3L/min today, upon EMS arrival 88% O2 saturation on 3L/min. Pt is currently on 5L/min at 98%. Ambulatory with assistance. Pt has a boot to his left foot due to a second toe amputation. Pt has a pressure wound to his sacrum. Pt states that he has home health care. Daughter is able to provide additional information.    Vitals 118/71 Hr 100 CBG 102 97.4 F  16 R

## 2020-07-27 NOTE — ED Notes (Signed)
External male condom catheter placed on patient

## 2020-07-27 NOTE — Progress Notes (Signed)
Spoke with Pt regarding his O2 delivery device.  Pt is currently wearing 4Lpm nasal cannula.  Offered to set Pt up on aerosol trach collar for O2 and humidity delivery via his stoma site.  Pt prefers to keep his nasal cannula on at this time.  Pt currently is not having any trouble with secretion clearance.  Pt agreeable to aerosol trach collar should he begin having trouble with secretion clearance.

## 2020-07-27 NOTE — H&P (Signed)
Ronald Ruiz KYH:[id number] DOB: 01/16/1956 DOA: 07/27/2020     PCP: [name], MD   Outpatient Specialists:    Pulmonary   Dr.Mannam  Oncology   Dr. Irene Limbo  Podiatry [name], DPM  Patient arrived to ER on 07/27/20 at 1604 Referred by Attending Milton Ferguson, MD   Patient coming from: home      Chief Complaint:   Chief Complaint  Patient presents with  . Shortness of Breath    HPI: Ronald Ruiz is a 65 y.o. 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  Has been on 2 L of oxygen at home and turn it up to 3 but when EMS arrived his oxygen saturation was 88% required up to 5 L to maintain O2 sats in mid 90s.  Non productive cough worsening today.  No chest pain no fevers or chills no nausea no vomiting patient has chronic dysphagia status post PEG tube. In the ER noted to have pressure wound to his sacrum.  Last admission was in February 2022 with shortness of breath and was found to have multifocal pneumonia.  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  Throughout hospital stay he had recurrent hypoglycemia recommendation was SNF . However, TOC was not able to secure SNF after reaching out to a total of 41 SNF's across several counties in network with his insurance.  Eventually, patient and family decided to go home with home health and DME.  Of note CT scan from 2021 showed waxing and waning pulmonary opacities likely secondary to recurrent aspiration pneumonia.  As well as large irregular masslike focus of consolidation of a peripheral right lower lobe He has had some cavitary lesions there as well  Infectious risk factors:  Reports  shortness of breath, cough,       Has    been vaccinated against COVID  and boosted   Initial COVID TEST  NEGATIVE   Lab Results  Component Value Date   SARSCOV2NAA NEGATIVE 07/27/2020   [place] NEGATIVE 06/23/2020   [place] NEGATIVE 07/02/2019    Regarding pertinent Chronic problems  Chronic pain syndrome on methadone as an outpatient  PAD ABI feb 2022       malnutrion-   BMI Readings from Last 1 Encounters:  07/06/20 15.49 kg/m       COPD -  followed by pulmonology    on baseline oxygen 2L,       Chronic anemia - baseline hg Hemoglobin & Hematocrit  Recent Labs    07/01/20 0339 07/09/20 0320 07/27/20 1635  HGB 9.3* 8.4* 10.8*     While in ER: Chest x-ray showing persistent bilateral opacities with some new left basilar opacities Started on cefepime    ED Triage Vitals  Enc Vitals Group     BP 07/27/20 1657 110/65     Pulse Rate 07/27/20 1615 93     Resp 07/27/20 1615 18     Temp 07/27/20 1657 98.8 F (37.1 C)     Temp Source 07/27/20 1657 Oral     SpO2 07/27/20 1615 93 %     Weight --      Height --      Head Circumference --      Peak Flow --  Pain Score 07/27/20 1618 0     Pain Loc --      Pain Edu? --      Excl. in [place]? --   TMAX(24)@     _________________________________________ Significant initial  Findings: Abnormal Labs Reviewed  CBC WITH DIFFERENTIAL/PLATELET - Abnormal; Notable for the following components:      Result Value   WBC 19.1 (*)    RBC 3.95 (*)    Hemoglobin 10.8 (*)    HCT 36.2 (*)    MCHC 29.8 (*)    RDW 19.3 (*)    Neutro Abs 17.5 (*)    Abs Immature Granulocytes 0.11 (*)    All other components within normal limits  COMPREHENSIVE METABOLIC PANEL - Abnormal; Notable for the following components:   BUN 28 (*)    Albumin 2.6 (*)    All other components within normal limits   ____________________________________________ Ordered    CXR -persistent opacities with some new opacities noted   CTA chest -  , no PE,  evidence of infiltrate and  chronic aspiration   _________________________ Troponin 8  ECG: Ordered Personally reviewed by me showing: HR : 87 Rhythm: NSR,   nonspecific changes  QTC 472  ____________    WBC     Component Value Date/Time   WBC 19.1 (H) 07/27/2020 1635   LYMPHSABS 0.7 07/27/2020 1635   LYMPHSABS 2.6 06/14/2018 1451   MONOABS 0.8 07/27/2020 1635   EOSABS 0.0 07/27/2020 1635   EOSABS 0.4 06/14/2018 1451   BASOSABS 0.0 07/27/2020 1635   BASOSABS 0.1 06/14/2018 1451       Lactic Acid, Venous    Component Value Date/Time   LATICACIDVEN 1.2 07/27/2020 1634     Procalcitonin 0.19     Results for orders placed or performed during the hospital encounter of 07/27/20  Resp Panel by RT-PCR (Flu A&B, Covid) Nasopharyngeal Swab     Status: None   Collection Time: 07/27/20  7:00 PM   Specimen: Nasopharyngeal Swab; Nasopharyngeal(NP) swabs in vial transport medium  Result Value Ref Range Status   SARS Coronavirus 2 by RT PCR NEGATIVE NEGATIVE Final         Influenza A by PCR NEGATIVE NEGATIVE Final   Influenza B by PCR NEGATIVE NEGATIVE Final            _______________________________________________ Hospitalist was called for admission for acute respiratory failure secondary to aspiration pneumonia.  The following Work up has been ordered so far:  Orders Placed This Encounter  Procedures  . Resp Panel by RT-PCR (Flu A&B, Covid) Nasopharyngeal Swab  . DG Chest Portable 1 View  . CBC with Differential  . Comprehensive metabolic panel  . Brain natriuretic peptide  . Lactic acid, plasma  . Lactic acid, plasma  . Height and weight  . Consult to hospitalist  . Airborne and Contact precautions  . ED EKG     Following Medications were ordered in ER: Medications  albuterol (VENTOLIN HFA) 108 (90 Base) MCG/ACT inhaler 4 puff (4 puffs Inhalation Given 07/27/20 1701)  ceFEPIme (MAXIPIME) 2 g in sodium chloride 0.9 % 100 mL IVPB (0 g Intravenous Stopped 07/27/20 1819)  vancomycin  (VANCOCIN) IVPB 1000 mg/200 mL premix (0 mg Intravenous Stopped 07/27/20 1914)        Consult Orders  (From admission, onward)         Start     Ordered   07/27/20 1913  Consult to hospitalist  Once  Provider:  (Not yet assigned)  Question Answer Comment  Place call to: [facility]   Reason for Consult Admit      07/27/20 1912          OTHER Significant initial  Findings:  labs showing:    Recent Labs  Lab 07/27/20 1635  NA 138  K 4.4  CO2 32  GLUCOSE 83  BUN 28*  CREATININE 0.88  CALCIUM 9.1    Cr     Up from baseline see below Lab Results  Component Value Date   CREATININE 0.88 07/27/2020   CREATININE 0.72 07/09/2020   CREATININE 0.70 07/01/2020    Recent Labs  Lab 07/27/20 1635  AST 17  ALT 12  ALKPHOS 84  BILITOT 0.4  PROT 7.4  ALBUMIN 2.6*   Lab Results  Component Value Date   CALCIUM 9.1 07/27/2020   PHOS 3.4 07/09/2020          Plt: Lab Results  Component Value Date   PLT 293 07/27/2020       Venous  Blood Gas result:  pH 7.351  PCO2 60       Recent Labs  Lab 07/27/20 1635  WBC 19.1*  NEUTROABS 17.5*  HGB 10.8*  HCT 36.2*  MCV 91.6  PLT 293    HG/HCT  stable,       Component Value Date/Time   HGB 10.8 (L) 07/27/2020 1635   HGB 10.9 (L) 01/28/2020 1443   HGB 14.5 06/14/2018 1451   HCT 36.2 (L) 07/27/2020 1635   HCT 42.6 06/14/2018 1451   MCV 91.6 07/27/2020 1635   MCV 93 06/14/2018 1451        Cultures:    Component Value Date/Time   SDES EXPECTORATED SPUTUM 06/23/2020 1924   SDES  06/23/2020 1924    EXPECTORATED SPUTUM Performed at [facility], [place] [address], [place], [place] [address]    [place] 06/23/2020 1924   SPECREQUEST  06/23/2020 1924    NONE Reflexed from [id number] Performed at [facility], [place] [address], [place], [place] [address]    CULT  06/23/2020 1924    ABUNDANT PROVIDENCIA RETTGERI MODERATE KLEBSIELLA PNEUMONIAE WITHIN NORMAL  RESPIRATORY FLORA Performed at [facility], [place] [address] [place], [place] [address]    REPTSTATUS 06/23/2020 FINAL 06/23/2020 1924   REPTSTATUS 06/27/2020 FINAL 06/23/2020 1924     Radiological Exams on Admission: DG Chest Portable 1 View  Result Date: 07/27/2020 CLINICAL DATA:  Shortness of breath. EXAM: PORTABLE CHEST 1 VIEW COMPARISON:  Most recent radiograph 06/23/2020.  Chest CT 03/03/2020 FINDINGS: Emphysema with chronic hyperinflation and bronchial thickening. Persistent patchy bilateral airspace opacities. New patchy airspace disease at the left lung base. There is biapical pleuroparenchymal scarring. Heart is normal in size. Blunting of the costophrenic angles was seen previously and likely related to hyperinflation. No large effusion. No pneumothorax. No pulmonary edema. IMPRESSION: 1. Persistent heterogeneous bilateral lung opacities since radiographs last month, with new left basilar airspace disease. Differential considerations include pneumonia (including aspiration) versus malignancy. 2. Emphysema with chronic hyperinflation. Electronically Signed   By: Keith Rake M.D.   On: 07/27/2020 17:32   _______________________________________________________________________________________________________ Latest  Blood pressure 108/62, pulse 68, temperature 98.8 F (37.1 C), resp. rate 12, SpO2 92 %.   Review of Systems:    Pertinent positives include:   fatigue, weight loss   shortness of breath at rest. productive cough Constitutional:  No weight loss, night sweats, Fevers, chills,  HEENT:  No headaches, Difficulty swallowing,Tooth/dental problems,Sore throat,  No sneezing, itching, ear ache, nasal congestion, post nasal drip,  Cardio-vascular:  No chest pain, Orthopnea, PND, anasarca, dizziness, palpitations.no Bilateral lower extremity swelling  GI:  No heartburn, indigestion, abdominal pain, nausea, vomiting, diarrhea, change in bowel habits, loss of appetite,  melena, blood in stool, hematemesis Resp:   No coughing up of blood.No change in color of mucus.No wheezing. Skin:  no rash or lesions. No jaundice GU:  no dysuria, change in color of urine, no urgency or frequency. No straining to urinate.  No flank pain.  Musculoskeletal:  No joint pain or no joint swelling. No decreased range of motion. No back pain.  Psych:  No change in mood or affect. No depression or anxiety. No memory loss.  Neuro: no localizing neurological complaints, no tingling, no weakness, no double vision, no gait abnormality, no slurred speech, no confusion  All systems reviewed and apart from [place] all are negative _______________________________________________________________________________________________ Past Medical History:   Past Medical History:  Diagnosis Date  . Bronchitis 03/2018  . COPD (chronic obstructive pulmonary disease) ([place])   . Herpes   . laryngeal ca dx'd 06/2018  . Lung nodule 2020  . Substance abuse ([place])    opiate addiction, been on methadone for 11 years      Past Surgical History:  Procedure Laterality Date  . AMPUTATION TOE Left 06/29/2020   Procedure: AMPUTATION TOE left second toe;  Surgeon: [name], DPM;  Location: [facility];  Service: Podiatry;  Laterality: Left;  . DIRECT LARYNGOSCOPY N/A 07/21/2018   Procedure: DIRECT LARYNGOSCOPY, TRACHEOSTOMY, BIOPSY;  Surgeon: Leta Baptist, MD;  Location: [facility];  Service: ENT;  Laterality: N/A;  . IR GASTROSTOMY TUBE MOD SED  08/02/2018    Social History:  Ambulatory    walker       reports that he has quit smoking. His smoking use included cigarettes. He has a 100.00 pack-year smoking history. He has never used smokeless tobacco. He reports previous alcohol use. He reports previous drug use.   Family History:   Family History  Problem Relation Age of Onset  . Alcohol abuse Father   . Cancer Father   . Cirrhosis Father   . Drug abuse Brother     ______________________________________________________________________________________________ Allergies: No Known Allergies   Prior to Admission medications   Medication Sig Start Date End Date Taking? Authorizing Provider  albuterol (PROVENTIL HFA;VENTOLIN HFA) 108 (90 Base) MCG/ACT inhaler Inhale 1-2 puffs into the lungs every 6 (six) hours as needed for wheezing.  03/30/18 07/27/20 Yes [provider]  budesonide (PULMICORT) 0.5 MG/2ML nebulizer solution INHALE CONTENTS OF 1 VIAL VIA NEBULIZER 2 TIMES DAILY Patient taking differently: Take 0.5 mg by nebulization 2 (two) times daily. 10/02/18  Yes Mannam, Praveen, MD  methadone (DOLOPHINE) 10 MG/ML solution Take 120 mg by mouth daily.   Yes [provider]  Nutritional Supplements (FEEDING SUPPLEMENT, KATE FARMS STANDARD 1.4,) LIQD liquid Place 650 mLs into feeding tube 5 (five) times daily. 07/10/20 08/09/20 Yes Mercy Riding, MD  pantoprazole sodium (PROTONIX) 40 mg/20 mL PACK Place 20 mLs (40 mg total) into feeding tube daily. 07/10/20 10/08/20 Yes Gonfa, Charlesetta Ivory, MD  ipratropium-albuterol (DUONEB) 0.5-2.5 (3) MG/3ML SOLN TAKE 3 MLS BY NEBULIZATION EVERY 6 (SIX) HOURS AS NEEDED. Patient not taking: Reported on 07/27/2020 07/31/19   Marshell Garfinkel, MD  Loperamide HCl ([employer] LOPERAMIDE HCL) 1 MG/7.5ML LIQD Place 15 mLs (2 mg total) into feeding tube every  4 (four) hours as needed (diarrhea). Patient not taking: Reported on 07/27/2020 07/10/20   Mercy Riding, MD  Nutritional Supplements (PROMOD) LIQD Give 30 mL Promod twice daily via PEG. Patient taking differently: Place 30 mLs into feeding tube 2 (two) times daily. 08/22/18   [name], MD  revefenacin [facility]) 175 MCG/3ML nebulizer solution Take 3 mLs (175 mcg total) by nebulization daily. Patient not taking: Reported on 07/27/2020 07/10/20   Mercy Riding, MD    ___________________________________________________________________________________________________ Physical  Exam: Vitals with BMI 07/27/2020 07/27/2020 07/27/2020  Height - - -  Weight - - -  BMI - - -  Systolic 564 332 951  Diastolic 62 62 68  Pulse 68 77 77     1. General:  in No  Acute distress   Chronically ill  Cachectic -appearing 2. Psychological: Alert and Oriented 3. Head/ENT:   Dry Mucous Membranes                          Head Non traumatic, neck supple                            Poor Dentition 4. SKIN  decreased Skin turgor,  Skin clean Dry Left foot sp resection with wound on the side of the foot, pressure ulcers on the buttocks and great toe 5. Heart: Regular rate and rhythm no  Murmur, no Rub or gallop 6. Lungs:  Some  wheezes and crackles   7. Abdomen: Soft,  non-tender, Non distended  bowel sounds present, PEG tube 8. Lower extremities: no clubbing, cyanosis 2+edema 9. Neurologically Grossly intact, moving all 4 extremities equally   10. MSK: Normal range of motion    Chart has been reviewed  ______________________________________________________________________________________________  Assessment/Plan  65 y.o. 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 Aspiration pneumonia of both lower lobes due to gastric secretions (HCC) Chronic severe  aspiration resulting in long-term lung changes And no progressive respiratory failure. Given aspiration pneumonia cover broadly with Zosyn Await results of sputum cultures. May benefit from pulmonology consult in a.m. given significant progressive lung disease secondary to chronic aspiration Respiratory care consult ordered may need deep suctioning  . COPD  -COPD may be contributing to acute respiratory failure. Continue home medications as needed  albuterol Respiratory therapy Hold off on steroids for tonight  . Acute respiratory failure with hypoxia (HCC) With chronic hypercapnia Patient is a new oxygen requirement.  this patient has acute respiratory failure with Hypoxia and  Hypercarbia as documented by the presence of following: O2 saturatio< 90% on RA  pCO2 >50  Likely due to:   Pneumonia,   COPD  Provide O2 therapy and titrate as needed  Continuous pulse ox  check Pulse ox with ambulation prior to discharge   . Chronic pulmonary aspiration hold tube feeds for today per physical exam patient seems to have significant aspiration need to reassess keep n.p.o.  . Dehydration -rehydrate and follow fluid status   . History of throat cancer . Malignant neoplasm of glottis (HCC)-email oncology.  Patient will likely need goals of care discussion given progressive decrease in functional status   . Pressure injury of skin -wound care  consult  . Protein-calorie malnutrition, severe -check prealbumin order nutritional consult administer thiamine  Chronic pain syndrome continue methadone   Dysphagia keep n.p.o.  Foot wound.  Recently operated on by podiatrist in February.  Would recommend podiatry consult in a.m. to reassess given some wound formation on the side left foot. Continue wound care Obtain plain images to evaluate for any presence of osteomyelitis.  Other plan as per orders.  DVT prophylaxis:    Lovenox       Code Status:    Code Status: Prior FULL CODE as per patient   I had personally discussed CODE STATUS with patient    Family Communication:   Family not at  Bedside    Disposition Plan:    likely will need placement for rehabilitation                           Following barriers for discharge:                            Electrolytes corrected                               Anemia stable                             Pain controlled with PO medications                               Afebrile, white count  improving able to transition to PO antibiotics                                                        Will need consultants to evaluate patient prior to discharge                     Would benefit from PT/OT eval prior to DC  Ordered                   Swallow eval - SLP ordered                                       Transition of care consulted                   Nutrition    consulted                  Wound care  consulted                   Palliative care    consulted                                       Consults called:    Emailed oncology, please call Pulmonology in AM given severe lung disease due to aspirtioan, please consult Podiatry in AM re Left foot wound  Admission status:  ED Disposition  ED Disposition Condition Comment   Admit  Hospital Area: [facility] [[id number]]  Level of Care: Telemetry [5]  Admit to tele based on following criteria: Other see comments  Comments: dyspnea  May admit patient to [facility] or [facility] if equivalent level of care is available:: No  Covid Evaluation: Confirmed COVID Negative  Diagnosis: COPD exacerbation [facility]) [[id number]]  Admitting Physician: Toy Baker [[id number]]  Attending Physician: Toy Baker [[id number]]  Estimated length of stay: past midnight tomorrow  Certification:: I certify this patient will need inpatient services for at least 2 midnights        inpatient     I Expect 2 midnight stay secondary to severity of patient's current illness need for inpatient interventions justified by the following:  hemodynamic instability despite optimal treatment ( hypoxia, hypercapnia)  Severe lab/radiological/exam abnormalities including:     and extensive comorbidities including:     Chronic pain     COPD/asthma      malignancy,   . History of amputation .    That are currently affecting medical management.   I expect  patient to be hospitalized for 2 midnights requiring inpatient medical  care.  Patient is at high risk for adverse outcome (such as loss of life or disability) if not treated.  Indication for inpatient stay as follows:    Hemodynamic instability despite maximal medical therapy,    inability to maintain oral hydration    New or worsening hypoxia  Need for IV antibiotics, IV fluids,      Level of care     tele  For 24H     Lab Results  Component Value Date   [place] NEGATIVE 07/27/2020     Precautions: admitted as   Covid Negative   PPE: Used by the provider:   P100  eye Goggles,  Gloves    Lovelyn Sheeran 07/27/2020, 12:53 AM    [facility]     after 2 AM please page floor coverage PA If 7AM-7PM, please contact the day team taking care of the patient using [email or web address]   Patient was evaluated in the context of the global COVID-19 pandemic, which necessitated consideration that the patient might be at risk for infection with the SARS-CoV-2 virus that causes COVID-19. Institutional protocols and algorithms that pertain to the evaluation of patients at risk for COVID-19 are in a state of rapid change based on information released by regulatory bodies including the CDC and federal and state organizations. These policies and algorithms were followed during the patient's care.

## 2020-07-27 NOTE — ED Notes (Signed)
Family at bedside. 

## 2020-07-27 NOTE — Progress Notes (Signed)
Pharmacy Antibiotic Note  Ronald Ruiz is a 65 y.o. male admitted on 07/27/2020 with aspiration PNA.  Pharmacy has been consulted for Zosyn dosing.  Plan: Zosyn 3.375g IV q8h (4 hour infusion).     Temp (24hrs), Avg:98.8 F (37.1 C), Min:98.8 F (37.1 C), Max:98.8 F (37.1 C)  Recent Labs  Lab 07/27/20 1634 07/27/20 1635 07/27/20 1854  WBC  --  19.1*  --   CREATININE  --  0.88  --   LATICACIDVEN 1.2  --  0.8    CrCl cannot be calculated (Unknown ideal weight.).    No Known Allergies  Antimicrobials this admission: 3/7 Cefepime & Vancomycin x 1 dose 3/8 Zosyn >>   Dose adjustments this admission:  Microbiology results: Blood Cx ordered Sputum Cx ordered Strep pneumo ordered Legionella ordered  Thank you for allowing pharmacy to be a part of this patient's care.  Minda Ditto PharmD 07/27/2020 8:21 PM

## 2020-07-27 NOTE — ED Provider Notes (Signed)
[facility] DEPT Provider Note   CSN: [id number] Arrival date & time: 07/27/20  1604     History Chief Complaint  Patient presents with  . Shortness of Breath    Ronald Ruiz is a 65 y.o. male.  HPI 66 year old male with a history of laryngeal cancer with residual stoma, oropharyngeal dysphagia with chronic aspiration, PEG dependence, substance use disorder on chronic methadone, COPD, lung nodule presents to the ER with complaints of shortness of breath which began last night.  Patient complains of a nonproductive cough which began overnight, worsening into today.  He denies any chest pain, fevers, chills, nausea, vomiting, or any other associated symptoms.  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  He is normally at 2 L nasal cannula baseline, EMS noted that he was hypoxic on 3 L, increased to 5 L, sats improved to 98%.  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    Past Medical History:  Diagnosis Date  . Bronchitis 03/2018  . COPD (chronic obstructive pulmonary disease) ([place])   . Herpes   . laryngeal ca dx'd 06/2018  . Lung nodule 2020  . Substance abuse ([place])    opiate addiction, been on methadone for 11 years    Patient Active Problem List   Diagnosis Date Noted  . Pressure injury of skin 07/06/2020  . Hypernatremia   . Closed nondisplaced fracture of lesser toe of left foot with malunion   . Diarrhea   . Abnormally small toe   . Acute respiratory failure with hypoxia ([place]) 06/23/2020  . Community acquired pneumonia 06/23/2020  . Severe sepsis ([place]) 06/23/2020  . AKI (acute kidney injury) ([place]) 07/02/2019  . COPD (chronic obstructive pulmonary disease) ([place])   . Transaminitis   . Normocytic anemia   . Chronic pulmonary aspiration   . History of  throat cancer   . Aspiration into airway 06/18/2019  . Dehydration 12/03/2018  . Counseling regarding advance care planning and goals of care 08/23/2018  . COPD with chronic bronchitis ([place]) 07/31/2018  . Dysphagia 07/31/2018  . Laryngeal mass 07/31/2018  . Substance abuse ([place]) 07/31/2018  . Protein-calorie malnutrition, severe 07/23/2018  . 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 AMPUTATION TOE Left 06/29/2020   Procedure: AMPUTATION TOE left second toe;  Surgeon: [name], DPM;  Location: [facility];  Service: Podiatry;  Laterality: Left;  . DIRECT LARYNGOSCOPY N/A 07/21/2018   Procedure: DIRECT LARYNGOSCOPY, TRACHEOSTOMY, BIOPSY;  Surgeon: Leta Baptist, MD;  Location: [facility];  Service: ENT;  Laterality: N/A;  . IR GASTROSTOMY TUBE MOD SED  08/02/2018       Family History  Problem Relation Age of Onset  . Alcohol abuse Father   . Cancer Father   . Cirrhosis Father   . Drug abuse Brother     Social History   Tobacco Use  . Smoking status: Former Smoker    Packs/day: 2.00    Years: 50.00    Pack years: 100.00    Types: Cigarettes  . Smokeless tobacco: Never Used  . Tobacco comment: 07/06/18 at 10 cigs per day, quit 06/16/18  Vaping Use  . Vaping Use: Former  . Start date: 05/23/2017  . Substances: Nicotine  Substance Use Topics  . Alcohol use:  Not Currently  . Drug use: Not Currently    Comment: hx of opiod abuse    Home Medications Prior to Admission medications   Medication Sig Start Date End Date Taking? Authorizing Provider  acetaminophen (TYLENOL) 160 MG/5ML liquid Take 500 mg by mouth every 4 (four) hours as needed for fever.   Yes [provider]  albuterol (PROVENTIL HFA;VENTOLIN HFA) 108 (90 Base) MCG/ACT inhaler Inhale 1-2 puffs into the lungs every 6 (six) hours as needed for wheezing.  03/30/18 07/27/20 Yes [provider]  budesonide (PULMICORT) 0.5 MG/2ML nebulizer solution INHALE  CONTENTS OF 1 VIAL VIA NEBULIZER 2 TIMES DAILY Patient taking differently: Take 0.5 mg by nebulization 2 (two) times daily. 10/02/18  Yes Mannam, Praveen, MD  methadone (DOLOPHINE) 10 MG/ML solution Take 120 mg by mouth daily.   Yes [provider]  Nutritional Supplements (FEEDING SUPPLEMENT, KATE FARMS STANDARD 1.4,) LIQD liquid Place 650 mLs into feeding tube 5 (five) times daily. 07/10/20 08/09/20 Yes Mercy Riding, MD  Nutritional Supplements (PROMOD) LIQD Give 30 mL Promod twice daily via PEG. Patient taking differently: Place 30 mLs into feeding tube 2 (two) times daily. 08/22/18  Yes [name], MD  pantoprazole sodium (PROTONIX) 40 mg/20 mL PACK Place 20 mLs (40 mg total) into feeding tube daily. 07/10/20 10/08/20 Yes Gonfa, Charlesetta Ivory, MD  ipratropium-albuterol (DUONEB) 0.5-2.5 (3) MG/3ML SOLN TAKE 3 MLS BY NEBULIZATION EVERY 6 (SIX) HOURS AS NEEDED. Patient not taking: No sig reported 07/31/19   Mannam, Hart Robinsons, MD  Loperamide HCl ([employer] LOPERAMIDE HCL) 1 MG/7.5ML LIQD Place 15 mLs (2 mg total) into feeding tube every 4 (four) hours as needed (diarrhea). Patient not taking: No sig reported 07/10/20   Mercy Riding, MD  revefenacin (YUPELRI) 175 MCG/3ML nebulizer solution Take 3 mLs (175 mcg total) by nebulization daily. Patient not taking: No sig reported 07/10/20   Mercy Riding, MD    Allergies    Patient has no known allergies.  Review of Systems   Review of Systems  Constitutional: Negative for chills and fever.  HENT: Negative for ear pain and sore throat.   Eyes: Negative for pain and visual disturbance.  Respiratory: Positive for cough and shortness of breath.   Cardiovascular: Negative for chest pain and palpitations.  Gastrointestinal: Negative for abdominal pain and vomiting.  Genitourinary: Negative for dysuria and hematuria.  Musculoskeletal: Negative for arthralgias and back pain.  Skin: Negative for color change and rash.  Neurological: Negative for seizures  and syncope.  All other systems reviewed and are negative.   Physical Exam Updated Vital Signs BP 111/62   Pulse 68   Temp 98.8 F (37.1 C)   Resp 16   SpO2 98%   Physical Exam Vitals and nursing note reviewed.  Constitutional:      General: He is not in acute distress.    Appearance: He is well-developed and well-nourished. He is not ill-appearing, toxic-appearing or diaphoretic.     Comments: Very cachectic in appearance  HENT:     Head: Normocephalic and atraumatic.  Eyes:     Conjunctiva/sclera: Conjunctivae normal.  Cardiovascular:     Rate and Rhythm: Normal rate and regular rhythm.     Heart sounds: No murmur heard.   Pulmonary:     Effort: Pulmonary effort is normal. No respiratory distress.     Breath sounds: Examination of the right-upper field reveals rhonchi. Examination of the left-upper field reveals rhonchi. Rhonchi present. No decreased breath sounds, wheezing  or rales.     Comments: Stoma without surrounding cellulitis or discharge Abdominal:     Palpations: Abdomen is soft.     Tenderness: There is no abdominal tenderness.  Musculoskeletal:        General: No edema.     Cervical back: Neck supple.     Comments: 2+ edema to lower extremities bilaterally.  Patient with a boot on his left foot with evidence of recent amputation.  Skin:    General: Skin is warm and dry.  Neurological:     General: No focal deficit present.     Mental Status: He is alert.  Psychiatric:        Mood and Affect: Mood and affect and mood normal.        Behavior: Behavior normal.     ED Results / Procedures / Treatments   Labs (all labs ordered are listed, but only abnormal results are displayed) Labs Reviewed  CBC WITH DIFFERENTIAL/PLATELET - Abnormal; Notable for the following components:      Result Value   WBC 19.1 (*)    RBC 3.95 (*)    Hemoglobin 10.8 (*)    HCT 36.2 (*)    MCHC 29.8 (*)    RDW 19.3 (*)    Neutro Abs 17.5 (*)    Abs Immature Granulocytes  0.11 (*)    All other components within normal limits  COMPREHENSIVE METABOLIC PANEL - Abnormal; Notable for the following components:   BUN 28 (*)    Albumin 2.6 (*)    All other components within normal limits  BRAIN NATRIURETIC PEPTIDE - Abnormal; Notable for the following components:   B Natriuretic Peptide 113.6 (*)    All other components within normal limits  RESP PANEL BY RT-PCR (FLU A&B, COVID) ARPGX2  MRSA PCR SCREENING  EXPECTORATED SPUTUM ASSESSMENT W GRAM STAIN, RFLX TO RESP C  CULTURE, BLOOD (ROUTINE X 2)  CULTURE, BLOOD (ROUTINE X 2)  LACTIC ACID, PLASMA  LACTIC ACID, PLASMA  BLOOD GAS, VENOUS  HIV ANTIBODY (ROUTINE TESTING W REFLEX)  PROCALCITONIN  LEGIONELLA PNEUMOPHILA SEROGP 1 UR AG  STREP PNEUMONIAE URINARY ANTIGEN  PROCALCITONIN  TROPONIN I (HIGH SENSITIVITY)  TROPONIN I (HIGH SENSITIVITY)    EKG None  Radiology DG Chest Portable 1 View  Result Date: 07/27/2020 CLINICAL DATA:  Shortness of breath. EXAM: PORTABLE CHEST 1 VIEW COMPARISON:  Most recent radiograph 06/23/2020.  Chest CT 03/03/2020 FINDINGS: Emphysema with chronic hyperinflation and bronchial thickening. Persistent patchy bilateral airspace opacities. New patchy airspace disease at the left lung base. There is biapical pleuroparenchymal scarring. Heart is normal in size. Blunting of the costophrenic angles was seen previously and likely related to hyperinflation. No large effusion. No pneumothorax. No pulmonary edema. IMPRESSION: 1. Persistent heterogeneous bilateral lung opacities since radiographs last month, with new left basilar airspace disease. Differential considerations include pneumonia (including aspiration) versus malignancy. 2. Emphysema with chronic hyperinflation. Electronically Signed   By: Keith Rake M.D.   On: 07/27/2020 17:32    Procedures Procedures   Medications Ordered in ED Medications  iohexol (OMNIPAQUE) 350 MG/ML injection 100 mL (has no administration in time  range)  albuterol (VENTOLIN HFA) 108 (90 Base) MCG/ACT inhaler 4 puff (4 puffs Inhalation Given 07/27/20 1701)  ceFEPIme (MAXIPIME) 2 g in sodium chloride 0.9 % 100 mL IVPB (0 g Intravenous Stopped 07/27/20 1819)  vancomycin (VANCOCIN) IVPB 1000 mg/200 mL premix (0 mg Intravenous Stopped 07/27/20 1914)    ED Course  I have reviewed the  triage vital signs and the nursing notes.  Pertinent labs & imaging results that were available during my care of the patient were reviewed by me and considered in my medical decision making (see chart for details).  Clinical Course as of 07/27/20 2012  Mon Jul 28, 2430  [id number] 65 year old male with a history of COPD, stoma secondary to laryngeal cancer presents with shortness of breath.  On arrival, he is very cachectic appearing, not tachypneic on my exam, though does have rales in the upper lobes.  Vitals on arrival with no fever, tachycardia, on 5 L currently at 95%.  No evidence of hypotension.  DDx includes pneumonia, PE, ACS, sepsis, heart failure  Plan for basic labs, septic work-up with lactic acid, blood cultures, D-dimer, troponin, Covid test, BNP given significant lower extremity edema.  We will treat for spinal acquired pneumonia given recent hospitalization.  Albuterol for COPD provided [MB]  1901 IMPRESSION: 1. Persistent heterogeneous bilateral lung opacities since radiographs last month, with new left basilar airspace disease. Differential considerations include pneumonia (including aspiration) versus malignancy. 2. Emphysema with chronic hyperinflation.   [MB]  1901 WBC(!): 19.1 [MB]  2000 I spoke about the plan for admission with the patient, he and his daughter are agreeable to this.  Dr. Roderic Palau spoke with Dr. Roel Cluck about admission for this patient.  Requested CT PE study which he ordered.  Patient remained stable for admission at this time.  This was a shared visit with my supervising physician Dr. Roderic Palau who independently saw and  evaluated the patient & provided guidance in evaluation/management/disposition ,in agreement with care  [MB]    Clinical Course User Index [MB] Lyndel Safe   MDM Rules/Calculators/A&P                          Final Clinical Impression(s) / ED Diagnoses Final diagnoses:  Hospital-acquired pneumonia    Rx / DC Orders ED Discharge Orders    None       Lyndel Safe 07/27/20 2012    Milton Ferguson, MD 07/28/20 2308

## 2020-07-27 NOTE — Progress Notes (Incomplete)
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  The patient was undergoing outpatient evaluation for lung nodules and a vocal cord lesion and presented to the emergency room with worsening dyspnea.  ENT was consulted for his vocal cord mass.  Due to the location of the laryngeal mass, the patient was in danger of developing an upper airway obstruction.  A tracheostomy was placed to protect his airway.  A biopsy of the mass was performed.  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  The patient tells me that he was having persistent upper respiratory symptoms with dyspnea and cough since about November 2019.  He was on several rounds of antibiotics and steroids without significant improvement.  He reports a 36-month history of hoarseness in his voice.  He has lost about 40 pounds over the past 6 months.  He also reports having a sore throat.  Denies difficulty swallowing to admission.  Breathing is better since placement of tracheostomy.  He has not had any fevers or chills.  Denies chest pain.  Denies nausea, vomiting, constipation, diarrhea.  He is on NG tube feeding at this  time.  Working with speech therapy on swallowing.  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  The patient is widowed and currently lives alone.  Wife died about 15 months ago secondary to lung cancer. Upon discharge, he plans to go live with 1 of his daughters in [place], [place].  He has 3 daughters who live locally.  1 of his daughters is an Therapist, sports who works on the oncology unit at [employer].   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 The patient's last visit with [place] was on 01/28/2020. The pt reports that he is doing well overall.  The pt reports ***  Lab results today 07/28/2020 of CBC w/diff and CMP is as follows: all values are WNL except for ***  On review of systems, pt reports *** and denies *** and any other symptoms.  MEDICAL HISTORY:  Past Medical History:  Diagnosis Date  . Bronchitis 03/2018  . COPD (chronic obstructive pulmonary disease) ([place])   . Herpes   . laryngeal ca dx'd 06/2018  . Lung nodule 2020  . Substance abuse ([place])    opiate addiction, been on methadone for 11 years    SURGICAL HISTORY: Past Surgical History:  Procedure Laterality Date  . AMPUTATION TOE Left 06/29/2020   Procedure: AMPUTATION TOE left second toe;  Surgeon: [name], DPM;  Location: [facility];  Service: Podiatry;  Laterality: Left;  . DIRECT LARYNGOSCOPY N/A 07/21/2018   Procedure: DIRECT LARYNGOSCOPY, TRACHEOSTOMY, BIOPSY;  Surgeon:  Leta Baptist, MD;  Location: [facility];  Service: ENT;  Laterality: N/A;  . IR GASTROSTOMY TUBE MOD SED  08/02/2018    SOCIAL HISTORY: Social History   Socioeconomic History  . Marital status: Widowed    Spouse name: Not on file  . Number of children: Not on file  . Years of education: Not on file  . Highest education level: Not on file   Occupational History  . Not on file  Tobacco Use  . Smoking status: Former Smoker    Packs/day: 2.00    Years: 50.00    Pack years: 100.00    Types: Cigarettes  . Smokeless tobacco: Never Used  . Tobacco comment: 07/06/18 at 10 cigs per day, quit 06/16/18  Vaping Use  . Vaping Use: Former  . Start date: 05/23/2017  . Substances: Nicotine  Substance and Sexual Activity  . Alcohol use: Not Currently  . Drug use: Not Currently    Comment: hx of opiod abuse  . Sexual activity: Not Currently    Birth control/protection: None  Other Topics Concern  . Not on file  Social History Narrative   Right Handed   Lives in a two story home   Social Determinants of Health   Financial Resource Strain: Not on file  Food Insecurity: Not on file  Transportation Needs: Not on file  Physical Activity: Not on file  Stress: Not on file  Social Connections: Not on file  Intimate Partner Violence: Not on file    FAMILY HISTORY: Family History  Problem Relation Age of Onset  . Alcohol abuse Father   . Cancer Father   . Cirrhosis Father   . Drug abuse Brother     ALLERGIES:  has No Known Allergies.  MEDICATIONS:  Current Outpatient Medications  Medication Sig Dispense Refill  . albuterol (PROVENTIL HFA;VENTOLIN HFA) 108 (90 Base) MCG/ACT inhaler Inhale 1-2 puffs into the lungs every 6 (six) hours as needed for wheezing.     . budesonide (PULMICORT) 0.5 MG/2ML nebulizer solution INHALE CONTENTS OF 1 VIAL VIA NEBULIZER 2 TIMES DAILY (Patient taking differently: Take 0.5 mg by nebulization 2 (two) times daily.) 360 mL 5  . ipratropium-albuterol (DUONEB) 0.5-2.5 (3) MG/3ML SOLN TAKE 3 MLS BY NEBULIZATION EVERY 6 (SIX) HOURS AS NEEDED. (Patient taking differently: Take 3 mLs by nebulization every 6 (six) hours as needed (shortness of breath).) 150 mL 2  . Loperamide HCl (CVS LOPERAMIDE HCL) 1 MG/7.5ML LIQD Place 15 mLs (2 mg total) into feeding tube every 4 (four) hours as needed (diarrhea). 118 mL  0  . Nutritional Supplements (FEEDING SUPPLEMENT, KATE FARMS STANDARD 1.4,) LIQD liquid Place 650 mLs into feeding tube 5 (five) times daily. 97500 mL 0  . Nutritional Supplements (PROMOD) LIQD Give 30 mL Promod twice daily via PEG. 2 Bottle 2  . pantoprazole sodium (PROTONIX) 40 mg/20 mL PACK Place 20 mLs (40 mg total) into feeding tube daily. 600 mL 2  . revefenacin (YUPELRI) 175 MCG/3ML nebulizer solution Take 3 mLs (175 mcg total) by nebulization daily. 90 mL 1   No current facility-administered medications for this visit.    REVIEW OF SYSTEMS:   10 Point review of Systems was done is negative except as noted above.  PHYSICAL EXAMINATION: ECOG PERFORMANCE STATUS: 1 - Symptomatic but completely ambulatory  There were no vitals filed for this visit. There were no vitals filed for this visit. .There is no height or weight on file to calculate BMI.   *** GENERAL:alert, in  no acute distress and comfortable SKIN: no acute rashes, no significant lesions EYES: conjunctiva are pink and non-injected, sclera anicteric OROPHARYNX: MMM, no exudates, no oropharyngeal erythema or ulceration NECK: supple, no JVD LYMPH:  no palpable lymphadenopathy in the cervical, axillary or inguinal regions LUNGS: clear to auscultation b/l with normal respiratory effort HEART: regular rate & rhythm ABDOMEN:  normoactive bowel sounds , non tender, not distended. Extremity: no pedal edema PSYCH: alert & oriented x 3 with fluent speech NEURO: no focal motor/sensory deficits  LABORATORY DATA:  I have reviewed the data as listed  . CBC Latest Ref Rng & Units 07/09/2020 07/01/2020 06/30/2020  WBC 4.0 - 10.5 K/uL 5.1 6.2 6.7  Hemoglobin 13.0 - 17.0 g/dL 8.4(L) 9.3(L) 8.5(L)  Hematocrit 39.0 - 52.0 % 28.3(L) 30.2(L) 27.8(L)  Platelets 150 - 400 K/uL 243 261 242    . CMP Latest Ref Rng & Units 07/09/2020 07/01/2020 06/30/2020  Glucose 70 - 99 mg/dL 98 73 78  BUN 8 - 23 mg/dL 34(H) 31(H) 25(H)  Creatinine 0.61 -  1.24 mg/dL 0.72 0.70 0.64  Sodium 135 - 145 mmol/L 140 142 138  Potassium 3.5 - 5.1 mmol/L 4.5 4.5 4.2  Chloride 98 - 111 mmol/L 100 102 99  CO2 22 - 32 mmol/L 30 33(H) 32  Calcium 8.9 - 10.3 mg/dL 8.4(L) 8.5(L) 8.3(L)  Total Protein 6.5 - 8.1 g/dL - - -  Total Bilirubin 0.3 - 1.2 mg/dL - - -  Alkaline Phos 38 - 126 U/L - - -  AST 15 - 41 U/L - - -  ALT 0 - 44 U/L - - -     RADIOGRAPHIC STUDIES: I have personally reviewed the radiological images as listed and agreed with the findings in the report. DG Foot Complete Left  Result Date: 06/29/2020 CLINICAL DATA:  Status post second toe amputation. EXAM: LEFT FOOT - COMPLETE 3+ VIEW COMPARISON:  June 26, 2020. FINDINGS: Status post amputation of the of phalanges of the second toe. Expected postsurgical findings are seen in the soft tissues. Hallux valgus deformity of the first metatarsophalangeal joint is noted with mild osteophyte formation. No other fracture or bony abnormality is noted. IMPRESSION: Status post amputation of phalanges of the second toe. No other significant abnormality is noted. Electronically Signed   By: Marijo Conception M.D.   On: 06/29/2020 08:49   VAS [place] ABI WITH/WO TBI  Result Date: 06/29/2020 LOWER EXTREMITY DOPPLER STUDY Indications: Peripheral artery disease, and S/P LLE 2nd toe amputation -              evaluate blood flow. High Risk Factors: Past history of smoking. Other Factors: COPD, HX throat CA (s/p tracheostomy & PEG tube placement for                dysphagia).  Comparison Study: No previous exams Performing Technologist: Hill, Jody RVT, RDMS  Examination Guidelines: A complete evaluation includes at minimum, Doppler waveform signals and systolic blood pressure reading at the level of bilateral brachial, anterior tibial, and posterior tibial arteries, when vessel segments are accessible. Bilateral testing is considered an integral part of a complete examination. Photoelectric Plethysmograph (PPG) waveforms and  toe systolic pressure readings are included as required and additional duplex testing as needed. Limited examinations for reoccurring indications may be performed as noted.  ABI Findings: +--------+------------------+-----+----------+--------+ Right   Rt Pressure (mmHg)IndexWaveform  Comment  +--------+------------------+-----+----------+--------+ Almon Hercules  biphasic           +--------+------------------+-----+----------+--------+ PTA     56                0.62 monophasic         +--------+------------------+-----+----------+--------+ DP      54                0.60 monophasic         +--------+------------------+-----+----------+--------+ +--------+------------------+-----+----------+---------------+ Left    Lt Pressure (mmHg)IndexWaveform  Comment         +--------+------------------+-----+----------+---------------+ Brachial                       biphasic  IV in upper arm +--------+------------------+-----+----------+---------------+ PTA     68                0.76 monophasic                +--------+------------------+-----+----------+---------------+ DP      61                0.68 monophasic                +--------+------------------+-----+----------+---------------+  Summary: Right: Resting right ankle-brachial index indicates moderate right lower extremity arterial disease. Left: Resting left ankle-brachial index indicates moderate left lower extremity arterial disease.  *See table(s) above for measurements and observations.  Electronically signed by Jamelle Haring on 06/29/2020 at 5:06:02 PM.   Final      ASSESSMENT & PLAN:   65 y.o. male with  1. 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  Discussed with Dr. Benjamine Mola at bedside patient is not a candidate for larynx sparing surgery. He has lost about 40 pounds and is  nutritionally quite depleted. Heavy smoking history of 1 to 2 packs/day with COPD /emphysema.  Not on home oxygen. 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 Has tolerated his tracheostomy well and is been able to speak with Passy-Muir valve. Has had issues with swallowing and is still needing tube feeding.  07/17/18 PET/CT revealed he has multiple pulmonary nodules none of them are FDG avid. No FDG avid cervical lymphadenopathy. FDG avid laryngeal tumor noted.  10/16/18 CT Chest revealed "Since the prior CT, the patient has developed tree-in-bud type opacities, bronchial wall thickening, and bronchial mucous plugging, in the left lower lobe consistent with infection/inflammation. This is suspicious for MAI infection. The nodule previously measured in left lower lobe on the prior study has decreased in size as has and a smaller adjacent nodule. These findings all support a benign inflammatory etiology. 2. Nodules right middle lobe, with a dominant nodule measuring 1 cm, are all stable. 3. Stable changes of centrilobular emphysema. 4. Three-vessel coronary artery calcifications and aortic atherosclerosis, also stable. Aortic Atherosclerosis and Emphysema."  S/p 6 weekly doses of Carboplatin and Taxol with concurrent RT, completed 10/16/18. He received IMRT total dose of 70 Gray in 35 fractions.  01/22/2019 NM PET Scan Whole Body ([id number]) revealed "1. Essentially resolved hypermetabolism and abnormal soft tissue in the glottis, with residual low level uptake in the left glottis, favor post treatment change. 2. Extensive patchy and nodular foci of consolidation throughout both lungs with associated intense hypermetabolism, new since 10/16/2018 chest CT, nonspecific but favoring multifocal pneumonia. 3. New mild hypermetabolism within bilateral hilar  and bilateral mediastinal lymph nodes, nonspecific, potentially reactive in  the setting of suspected pneumonia, with metastatic disease not entirely excluded. 4. New small dependent left pleural effusion. 5. Recommend follow-up PET-CT or chest CT with IV contrast in 3 months. 6. No evidence of hypermetabolic metastatic disease in the neck, abdomen, pelvis or skeleton. 7. Aortic Atherosclerosis (ICD10-I70.0) and Emphysema (ICD10-J43.9)."  12/31/2019 PET/CT ([id number]) revealed "1. Resolution of prior hypermetabolic consolidative regions in the lungs shown on 01/22/2019, with new hypermetabolic areas of probable consolidation posteromedially in the right lower lobe (subtended by obstructed/plugged bronchi) and with various hypermetabolic irregular nodules in both lungs. Given the pattern, wall metastatic disease is certainly a possibility, active atypical pneumonia with hypermetabolic involvement is also a possibility. The consolidation along the inferior pulmonary ligament on the right is reduced compared to the recent CT chest from 11/29/2019 the medial right lower lobe consolidation is increased. Surveillance chest CT would likely be valuable in assessing the natural history of the pulmonary infiltrates and helping in differentiating infectious from malignant etiologies. 2. No recurrence in the neck. 3. No findings of malignancy involving the abdomen/pelvis or skeleton. 4. Aortic Atherosclerosis (ICD10-I70.0) and Emphysema (ICD10-J43.9). Coronary atherosclerosis. 5. Mild splenomegaly without abnormal splenic activity. 6. Healing nondisplaced left anterior fourth rib fracture, not readily apparent on 11/29/2019."  PLAN: -Discussed pt labwork today, 07/28/2020; ***   -Discussed again recommendations for complete smoking cessation -Will see back in ***    FOLLOW UP: ***   The total time spent in the appointment was *** minutes and more than 50% was on counseling and direct patient cares.   All of the patient's questions were answered with apparent satisfaction. The  patient knows to call the clinic with any problems, questions or concerns.    Sullivan Lone MD [facility] [place] AAHIVMS [facility] Hematology/Oncology Physician [facility]  (Office):       [phone] (Work cell):  [phone] (Fax):           [phone]  07/27/2020 3:33 PM   I, Reinaldo Raddle, am acting as scribe for Dr. Sullivan Lone, MD.

## 2020-07-27 NOTE — ED Notes (Signed)
Pt coughed up copious amount of yellow pus like discharge from stoma in front of neck, provider made aware.

## 2020-07-27 NOTE — Progress Notes (Signed)
A consult was received from an ED physician for Vancomycin per pharmacy dosing.  The patient's profile has been reviewed for ht/wt/allergies/indication/available labs. Previous weight in February 114 lb, requested current weight.     A one time order has been placed for Vancomycin 1gm.  Further antibiotics/pharmacy consults should be ordered by admitting physician if indicated.                       Thank you,  Minda Ditto PharmD 07/27/2020  6:00 PM

## 2020-07-28 ENCOUNTER — Inpatient Hospital Stay: Payer: 59 | Admitting: Hematology

## 2020-07-28 ENCOUNTER — Inpatient Hospital Stay: Payer: 59 | Attending: Hematology

## 2020-07-28 ENCOUNTER — Inpatient Hospital Stay (HOSPITAL_COMMUNITY): Payer: 59

## 2020-07-28 ENCOUNTER — Other Ambulatory Visit: Payer: Self-pay

## 2020-07-28 DIAGNOSIS — J9621 Acute and chronic respiratory failure with hypoxia: Secondary | ICD-10-CM

## 2020-07-28 DIAGNOSIS — J690 Pneumonitis due to inhalation of food and vomit: Secondary | ICD-10-CM | POA: Diagnosis present

## 2020-07-28 DIAGNOSIS — Z515 Encounter for palliative care: Secondary | ICD-10-CM

## 2020-07-28 LAB — CBC WITH DIFFERENTIAL/PLATELET
Abs Immature Granulocytes: 0.04 10*3/uL (ref 0.00–0.07)
Basophils Absolute: 0 10*3/uL (ref 0.0–0.1)
Basophils Relative: 0 %
Eosinophils Absolute: 0 10*3/uL (ref 0.0–0.5)
Eosinophils Relative: 0 %
HCT: 31.9 % — ABNORMAL LOW (ref 39.0–52.0)
Hemoglobin: 9.9 g/dL — ABNORMAL LOW (ref 13.0–17.0)
Immature Granulocytes: 0 %
Lymphocytes Relative: 5 %
Lymphs Abs: 0.6 10*3/uL — ABNORMAL LOW (ref 0.7–4.0)
MCH: 28.1 pg (ref 26.0–34.0)
MCHC: 31 g/dL (ref 30.0–36.0)
MCV: 90.6 fL (ref 80.0–100.0)
Monocytes Absolute: 0.7 10*3/uL (ref 0.1–1.0)
Monocytes Relative: 5 %
Neutro Abs: 11.8 10*3/uL — ABNORMAL HIGH (ref 1.7–7.7)
Neutrophils Relative %: 90 %
Platelets: 258 10*3/uL (ref 150–400)
RBC: 3.52 MIL/uL — ABNORMAL LOW (ref 4.22–5.81)
RDW: 19.4 % — ABNORMAL HIGH (ref 11.5–15.5)
WBC: 13.2 10*3/uL — ABNORMAL HIGH (ref 4.0–10.5)
nRBC: 0 % (ref 0.0–0.2)

## 2020-07-28 LAB — COMPREHENSIVE METABOLIC PANEL
ALT: 13 U/L (ref 0–44)
AST: 18 U/L (ref 15–41)
Albumin: 2 g/dL — ABNORMAL LOW (ref 3.5–5.0)
Alkaline Phosphatase: 72 U/L (ref 38–126)
Anion gap: 5 (ref 5–15)
BUN: 22 mg/dL (ref 8–23)
CO2: 31 mmol/L (ref 22–32)
Calcium: 8.7 mg/dL — ABNORMAL LOW (ref 8.9–10.3)
Chloride: 101 mmol/L (ref 98–111)
Creatinine, Ser: 0.73 mg/dL (ref 0.61–1.24)
GFR, Estimated: >60 mL/min (ref >60)
Glucose, Bld: 75 mg/dL (ref 70–99)
Potassium: 4.1 mmol/L (ref 3.5–5.1)
Sodium: 137 mmol/L (ref 135–145)
Total Bilirubin: 0.6 mg/dL (ref 0.3–1.2)
Total Protein: 6.4 g/dL — ABNORMAL LOW (ref 6.5–8.1)

## 2020-07-28 LAB — CK: Total CK: 23 U/L — ABNORMAL LOW (ref 49–397)

## 2020-07-28 LAB — PHOSPHORUS: Phosphorus: 3.3 mg/dL (ref 2.5–4.6)

## 2020-07-28 LAB — EXPECTORATED SPUTUM ASSESSMENT W GRAM STAIN, RFLX TO RESP C

## 2020-07-28 LAB — STREP PNEUMONIAE URINARY ANTIGEN: Strep Pneumo Urinary Antigen: NEGATIVE

## 2020-07-28 LAB — SEDIMENTATION RATE: Sed Rate: 80 mm/hr — ABNORMAL HIGH (ref 0–16)

## 2020-07-28 LAB — C-REACTIVE PROTEIN: CRP: 12.3 mg/dL — ABNORMAL HIGH (ref <1.0)

## 2020-07-28 LAB — TSH: TSH: 1.877 u[IU]/mL (ref 0.350–4.500)

## 2020-07-28 LAB — PROCALCITONIN: Procalcitonin: 0.21 ng/mL

## 2020-07-28 LAB — MAGNESIUM: Magnesium: 2.2 mg/dL (ref 1.7–2.4)

## 2020-07-28 LAB — PREALBUMIN: Prealbumin: 6.7 mg/dL — ABNORMAL LOW (ref 18–38)

## 2020-07-28 LAB — MRSA PCR SCREENING: MRSA by PCR: NEGATIVE

## 2020-07-28 LAB — HIV ANTIBODY (ROUTINE TESTING W REFLEX): HIV Screen 4th Generation wRfx: NONREACTIVE

## 2020-07-28 MED ORDER — ENOXAPARIN SODIUM 40 MG/0.4ML ~~LOC~~ SOLN
40.0000 mg | SUBCUTANEOUS | Status: DC
  Administered 2020-07-28 – 2020-07-31 (×4): 40 mg via SUBCUTANEOUS
  Filled 2020-07-28 (×4): qty 0.4

## 2020-07-28 MED ORDER — ACETAMINOPHEN 325 MG PO TABS
650.0000 mg | ORAL_TABLET | Freq: Four times a day (QID) | ORAL | Status: DC | PRN
  Administered 2020-07-28 – 2020-07-30 (×2): 650 mg via ORAL
  Filled 2020-07-28 (×3): qty 2

## 2020-07-28 MED ORDER — ONDANSETRON HCL 4 MG/2ML IJ SOLN: 4.0000 mg | Freq: Four times a day (QID) | INTRAMUSCULAR | Status: DC | PRN

## 2020-07-28 MED ORDER — VANCOMYCIN HCL 1000 MG/200ML IV SOLN: 1000.0000 mg | INTRAVENOUS | Status: DC

## 2020-07-28 MED ORDER — CHLORHEXIDINE GLUCONATE 0.12 % MT SOLN
15.0000 mL | Freq: Two times a day (BID) | OROMUCOSAL | Status: DC
  Administered 2020-07-28 – 2020-07-31 (×6): 15 mL via OROMUCOSAL
  Filled 2020-07-28 (×6): qty 15

## 2020-07-28 MED ORDER — SODIUM CHLORIDE 0.9 % IV SOLN
3.0000 g | Freq: Four times a day (QID) | INTRAVENOUS | Status: DC
  Administered 2020-07-28 – 2020-07-30 (×8): 3 g via INTRAVENOUS
  Filled 2020-07-28 (×2): qty 3
  Filled 2020-07-28 (×2): qty 8
  Filled 2020-07-28 (×4): qty 3
  Filled 2020-07-28: qty 8

## 2020-07-28 MED ORDER — THIAMINE HCL 100 MG PO TABS
100.0000 mg | ORAL_TABLET | Freq: Every day | ORAL | Status: DC
  Administered 2020-07-29 – 2020-07-31 (×3): 100 mg
  Filled 2020-07-28 (×3): qty 1

## 2020-07-28 MED ORDER — BUDESONIDE 0.5 MG/2ML IN SUSP
0.5000 mg | Freq: Two times a day (BID) | RESPIRATORY_TRACT | Status: DC
  Administered 2020-07-28 – 2020-07-31 (×7): 0.5 mg via RESPIRATORY_TRACT
  Filled 2020-07-28 (×7): qty 2

## 2020-07-28 MED ORDER — FLUTICASONE PROPIONATE 50 MCG/ACT NA SUSP
2.0000 | Freq: Every day | NASAL | Status: DC
  Filled 2020-07-28: qty 16

## 2020-07-28 MED ORDER — BISACODYL 10 MG RE SUPP: 10.0000 mg | Freq: Every day | RECTAL | Status: DC | PRN

## 2020-07-28 MED ORDER — ARFORMOTEROL TARTRATE 15 MCG/2ML IN NEBU
15.0000 ug | INHALATION_SOLUTION | Freq: Two times a day (BID) | RESPIRATORY_TRACT | Status: DC
  Administered 2020-07-28 – 2020-07-31 (×7): 15 ug via RESPIRATORY_TRACT
  Filled 2020-07-28 (×7): qty 2

## 2020-07-28 MED ORDER — IPRATROPIUM-ALBUTEROL 0.5-2.5 (3) MG/3ML IN SOLN
3.0000 mL | Freq: Four times a day (QID) | RESPIRATORY_TRACT | Status: DC
  Administered 2020-07-28 – 2020-07-31 (×12): 3 mL via RESPIRATORY_TRACT
  Filled 2020-07-28 (×10): qty 3

## 2020-07-28 MED ORDER — IPRATROPIUM BROMIDE 0.02 % IN SOLN
0.5000 mg | Freq: Four times a day (QID) | RESPIRATORY_TRACT | Status: DC
  Administered 2020-07-28 (×2): 0.5 mg via RESPIRATORY_TRACT
  Filled 2020-07-28 (×2): qty 2.5

## 2020-07-28 MED ORDER — ORAL CARE MOUTH RINSE
15.0000 mL | Freq: Two times a day (BID) | OROMUCOSAL | Status: DC
  Administered 2020-07-28 – 2020-07-31 (×6): 15 mL via OROMUCOSAL

## 2020-07-28 MED ORDER — ALBUTEROL SULFATE HFA 108 (90 BASE) MCG/ACT IN AERS
1.0000 | INHALATION_SPRAY | RESPIRATORY_TRACT | Status: DC | PRN
  Filled 2020-07-28: qty 6.7

## 2020-07-28 MED ORDER — ONDANSETRON HCL 4 MG PO TABS: 4.0000 mg | ORAL_TABLET | Freq: Four times a day (QID) | ORAL | Status: DC | PRN

## 2020-07-28 MED ORDER — PANTOPRAZOLE SODIUM 40 MG PO PACK
40.0000 mg | PACK | Freq: Every day | ORAL | Status: DC
  Administered 2020-07-28 – 2020-07-31 (×4): 40 mg
  Filled 2020-07-28 (×4): qty 20

## 2020-07-28 MED ORDER — ALBUMIN HUMAN 25 % IV SOLN
25.0000 g | Freq: Four times a day (QID) | INTRAVENOUS | Status: AC
  Administered 2020-07-28 – 2020-07-30 (×8): 25 g via INTRAVENOUS
  Filled 2020-07-28 (×8): qty 100

## 2020-07-28 MED ORDER — THIAMINE HCL 100 MG/ML IJ SOLN
100.0000 mg | Freq: Every day | INTRAMUSCULAR | Status: DC
  Administered 2020-07-28: 100 mg via INTRAVENOUS
  Filled 2020-07-28: qty 2

## 2020-07-28 MED ORDER — ACETAMINOPHEN 650 MG RE SUPP: 650.0000 mg | Freq: Four times a day (QID) | RECTAL | Status: DC | PRN

## 2020-07-28 MED ORDER — SODIUM CHLORIDE 0.9 % IV SOLN
75.0000 mL/h | INTRAVENOUS | Status: AC
  Administered 2020-07-28: 75 mL/h via INTRAVENOUS

## 2020-07-28 MED ORDER — LEVALBUTEROL HCL 0.63 MG/3ML IN NEBU
0.6300 mg | INHALATION_SOLUTION | Freq: Four times a day (QID) | RESPIRATORY_TRACT | Status: DC
  Administered 2020-07-28: 0.63 mg via RESPIRATORY_TRACT
  Filled 2020-07-28: qty 3

## 2020-07-28 MED ORDER — GUAIFENESIN 100 MG/5ML PO SOLN
10.0000 mL | Freq: Two times a day (BID) | ORAL | Status: DC
  Administered 2020-07-28 – 2020-07-31 (×7): 200 mg
  Filled 2020-07-28 (×4): qty 10
  Filled 2020-07-28: qty 20
  Filled 2020-07-28 (×2): qty 10

## 2020-07-28 MED ORDER — MORPHINE SULFATE (PF) 2 MG/ML IV SOLN
1.0000 mg | Freq: Once | INTRAVENOUS | Status: AC
  Administered 2020-07-28: 1 mg via INTRAVENOUS
  Filled 2020-07-28: qty 1

## 2020-07-28 MED ORDER — LORATADINE 10 MG PO TABS
10.0000 mg | ORAL_TABLET | Freq: Every day | ORAL | Status: DC
  Administered 2020-07-28 – 2020-07-31 (×4): 10 mg
  Filled 2020-07-28 (×4): qty 1

## 2020-07-28 MED ORDER — METHADONE HCL 10 MG/ML PO CONC
120.0000 mg | Freq: Every day | ORAL | Status: DC
Start: 2020-07-28 — End: 2020-07-31
  Administered 2020-07-28 – 2020-07-31 (×4): 120 mg via ORAL
  Filled 2020-07-28 (×6): qty 12

## 2020-07-28 NOTE — Progress Notes (Signed)
Patient MEWS score turned yellow due to pulse and BP.  MD made aware, will initiate yellow VS.  Patient in NAD and sitting up in chair at this time.   07/28/20 1304  Assess: MEWS Score  Temp (!) 97.2 F (36.2 C)  BP (!) 93/44  Pulse Rate (!) 49  Resp 16  SpO2 100 %  O2 Device Nasal Cannula  O2 Flow Rate (L/min) 2 L/min  Assess: MEWS Score  MEWS Temp 0  MEWS Systolic 1  MEWS Pulse 1  MEWS RR 0  MEWS LOC 0  MEWS Score 2  MEWS Score Color Yellow  Assess: if the MEWS score is Yellow or Red  Were vital signs taken at a resting state? Yes  Focused Assessment No change from prior assessment  Early Detection of Sepsis Score *See Row Information* Low  MEWS guidelines implemented *See Row Information* Yes  Treat  MEWS Interventions Other (Comment) (MD aware of soft BP and low pulse)  Pain Scale 0-10  Pain Score 5  Pain Type Acute pain  Pain Location Buttocks  Pain Orientation Left  Pain Descriptors / Indicators Sore  Pain Frequency Intermittent  Pain Onset Progressive  Patients Stated Pain Goal 3  Pain Intervention(s) Repositioned  Multiple Pain Sites No  Take Vital Signs  Increase Vital Sign Frequency  Yellow: Q 2hr X 2 then Q 4hr X 2, if remains yellow, continue Q 4hrs  Escalate  MEWS: Escalate Yellow: discuss with charge nurse/RN and consider discussing with provider and RRT  Notify: Charge Nurse/RN  Name of Charge Nurse/RN Notified Marissa Long  Date Charge Nurse/RN Notified 07/28/20  Time Charge Nurse/RN Notified 1331  Notify: Provider  Provider Name/Title Dr. Grandville Silos  Date Provider Notified 07/28/20  Time Provider Notified 1331  Notification Type Page

## 2020-07-28 NOTE — Plan of Care (Signed)

## 2020-07-28 NOTE — Evaluation (Signed)
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 EMS increased to 5L to allow pt to reach mid 90s.   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   Clinical Impression   Patient is currently requiring assistance with ADLs including minimal assist with toileting, maximum assist with LE dressing, minimal assist with bathing while seated, and full setup assist with UE dressing and seated grooming, all of which is below patient's typical baseline of being Modified independent.  During this evaluation, patient was limited by general debility, LE weakness, and impaired activity tolerance, which has the potential to impact patient's safety and independence during functional mobility, as well as performance for ADLs. [place] "6-clicks" Daily Activity Inpatient Short Form score of 15/24 indicates 56.46% ADL impairment this session. Patient lives with his daughter, who is able to provide PRN supervision and assistance as she works ~50 hours/week.  Patient demonstrates good rehab potential, and should benefit from continued skilled occupational therapy services while in acute care to maximize safety, independence and quality of life at home.  Continued occupational therapy services in the home is recommended.  ?   Follow Up Recommendations  Home health OT    Equipment Recommendations  Tub/shower seat    Recommendations for Other  Services       Precautions / Restrictions Precautions Precautions: Fall Precaution Comments: on Trach collar at rest. amb on RA, monitor sats, PEG tube-watch gait belt placement, sacral wound, r great toe necrotic at tip. Required Braces or Orthoses: Other Brace Other Brace: post op shoe for left second toe amputation Restrictions Weight Bearing Restrictions: No LLE Weight Bearing: Weight bearing as tolerated Other Position/Activity Restrictions: in postop shoe      Mobility Bed Mobility Overal bed mobility: Needs Assistance Bed Mobility: Supine to Sit     Supine to sit: Min guard;HOB elevated     General bed mobility comments: cues for technique and to manage lines Patient Response: Cooperative  Transfers Overall transfer level: Needs assistance Equipment used: Rolling walker (2 wheeled) Transfers: Sit to/from Stand Sit to Stand: Min assist         General transfer comment: assistance to stand with RW, cues for technique and hand placement.    Balance Overall balance assessment: History of Falls;Needs assistance Sitting-balance support: No upper extremity supported;Feet supported Sitting balance-Leahy Scale: Good     Standing balance support: Bilateral upper extremity supported;During functional activity Standing balance-Leahy Scale: Poor Standing balance comment: Reliant on BUE support on RW.                           ADL either performed or assessed with clinical judgement   ADL Overall ADL's : Needs assistance/impaired Eating/Feeding: NPO Eating/Feeding Details (indicate cue type and reason): PEG feeds only. Grooming: Set up;Sitting   Upper Body Bathing: Set up;Sitting   Lower Body Bathing: Sit to/from stand;Minimal assistance   Upper Body Dressing : Set up;Sitting   Lower Body Dressing: Maximal assistance;Sitting/lateral leans Lower Body Dressing  Details (indicate cue type and reason): Total Assist to don sock to RT foot and Max As to don  ortho shoe to LT.   Toilet Transfer Details (indicate cue type and reason): Please see Mobility section for pt t/f to chair. Pt denied need to void. Toileting- Clothing Manipulation and Hygiene: Sitting/lateral lean;Sit to/from stand;Minimal assistance       Functional mobility during ADLs: Minimal assistance       Vision Patient Visual Report: No change from baseline Vision Assessment?: No apparent visual deficits     Perception     Praxis      Pertinent Vitals/Pain Pain Assessment: No/denies pain Pain Location: left foot- pt denied pain at rest and stated that pain comes and goes. Pain Intervention(s): Monitored during session     Hand Dominance Right   Extremity/Trunk Assessment Upper Extremity Assessment Upper Extremity Assessment: Defer to OT evaluation   Lower Extremity Assessment Lower Extremity Assessment: Generalized weakness   Cervical / Trunk Assessment Cervical / Trunk Assessment: Normal Cervical / Trunk Exceptions: cachexia appearance   Communication Communication Communication: Tracheostomy   Cognition Arousal/Alertness: Awake/alert Behavior During Therapy: WFL for tasks assessed/performed Overall Cognitive Status: Within Functional Limits for tasks assessed                                 General Comments: Stated month as "february". otherwise oriented.   General Comments  R foot edema and great toe necrotic tissue on tip, frail and atrophy noted    Exercises     Shoulder Instructions      Home Living Family/patient expects to be discharged to:: Private residence Living Arrangements: Children Available Help at Discharge: Family Type of Home: House Home Access: Stairs to enter Technical brewer of Steps: 2 Entrance Stairs-Rails: None Home Layout: Two level;Bed/bath upstairs Alternate Level Stairs-Number of Steps: full flight with landing.  Bilateral rails up first 1/2 of flight to landing, then rail on LT for remainder  of flight   Bathroom Shower/Tub: Walk-in shower   Bathroom Toilet: Handicapped height     Home Equipment: Walker - 2 wheels;Hand held shower head   Additional Comments: Adjustable bed without rails.      Prior Functioning/Environment Level of Independence: Independent with assistive device(s)        Comments: lives with daughter, she reports working 50 hrs/week; pt has recently been mainly staying in his room upstairs (no downstairs bedrooms). Pt's daughter stated that they are seacrhing for a 1 story home but no luck yet. Pt reports that he ambulates with his RW as needed, and endorases more than 1 fall in last year.        OT Problem List: Decreased strength;Decreased activity tolerance;Impaired balance (sitting and/or standing);Decreased knowledge of use of DME or AE;Decreased knowledge of precautions;Cardiopulmonary status limiting activity;Pain      OT Treatment/Interventions: Self-care/ADL training;Therapeutic exercise;DME and/or AE instruction;Energy conservation;Therapeutic activities;Patient/family education;Balance training    OT Goals(Current goals can be found in the care plan section) Acute Rehab OT Goals Patient Stated Goal: Increase strength in order to go up/down stairs at home. OT Goal Formulation: With patient/family Time For Goal Achievement: 08/11/20 Potential to Achieve Goals: Good ADL Goals Pt Will Perform Lower Body Dressing: with adaptive equipment;with modified independence Pt Will Transfer to Toilet: with modified independence;ambulating Pt Will Perform Toileting - Clothing Manipulation and hygiene: with modified independence;with adaptive equipment Additional ADL Goal #1: Patient will tolerate BUE home exercise program  10-15 reps within pain-free ranges, in an unsupported seated position, in order to improve upper body strength, endurance and core stability needed to complete home ADLs. Additional ADL Goal #2: Pt will engage in 8 min standing functional  activities without loss of balance, in order to demonstrate improved activity tolerance and balance needed to perform ADLs safely at home.  OT Frequency: Min 2X/week   Barriers to D/C:            Co-evaluation PT/OT/SLP Co-Evaluation/Treatment: Yes Reason for Co-Treatment: Complexity of the patient's impairments (multi-system involvement);For patient/therapist safety   OT goals addressed during session: ADL's and self-care      AM-PAC OT "6 Clicks" Daily Activity     Outcome Measure Help from another person eating meals?: Total (PEG) Help from another person taking care of personal grooming?: A Little Help from another person toileting, which includes using toliet, bedpan, or urinal?: A Little Help from another person bathing (including washing, rinsing, drying)?: A Little Help from another person to put on and taking off regular upper body clothing?: A Little Help from another person to put on and taking off regular lower body clothing?: A Lot 6 Click Score: 15   End of Session Equipment Utilized During Treatment: Rolling walker;Gait belt Nurse Communication: Mobility status  Activity Tolerance: Patient tolerated treatment well Patient left: in chair;with call bell/phone within reach;with family/visitor present;with chair alarm set  OT Visit Diagnosis: Unsteadiness on feet (R26.81);Muscle weakness (generalized) (M62.81);Pain Pain - Right/Left: Left Pain - part of body: Ankle and joints of foot                Time: 3818-2993 OT Time Calculation (min): 27 min Charges:  OT General Charges $OT Visit: 1 Visit OT Evaluation $OT Eval Low Complexity: 1 Low OT Treatments $Self Care/Home Management : 8-22 mins  Anderson Malta, OT Acute Rehab Services Office: [phone] 07/28/2020  Julien Girt 07/28/2020, 1:01 PM

## 2020-07-28 NOTE — Consult Note (Signed)
Palliative Care Consult Note  Reason for consult: Goals of care in light of recurrent aspiration  Palliative care consult received.  Chart reviewed including personal review of pertinent labs and imaging.  Briefly, Ronald Ruiz is a 65 year old male with PMHx laryngeal cancer s/p trach/radiation (decennulated but stoma remains) chronic respiratory failure, PEG for nutrition, substance abuse, and chronic aspiration who is admitted with aspiration PNA.    I met today with Ronald Ruiz.  He reports that being as independent as possible and spending time with family are the most important things to him.  He has 3 daughters (lives with one of them).    We discussed clinical course as well as wishes moving forward in regard to advanced directives.  Discussed recurrent aspiration.  He indicates that he can feel aspiration events and attributes them to bolus tube feed regurgitation.  Discussed plan outlined by PCCM to try to minimize this.  Concepts specific to code status, care plan this hosptitalization, and rehospitalization discussed.  We discussed difference between a aggressive medical intervention path and a palliative, comfort focused care path.  Values and goals of care important to patient and family were attempted to be elicited.  Questions and concerns addressed.   PMT will continue to support holistically.  Recommendations: - Full code/full scope - He understands his situation and the seriousness of recurrent aspiration but is hopeful for some recovery and stabilization of function.  He would like to see how he does with changes to his tube feeds prior to consideration for any changes to his goals.  He is invested in a plan for continuation of aggressive interventions at this point.   - He would like to name one of his daughters (whom he lives with) as his HCPOA.  Will place referral to spiritual care to facilitate.  Start time 1715 End time 1815 Total time 75 minutes  Greater than  50%  of this time was spent counseling and coordinating care related to the above assessment and plan.  Micheline Rough, MD [facility] [phone]

## 2020-07-28 NOTE — Progress Notes (Signed)
Pharmacy Antibiotic Note  Ronald Ruiz is a 65 y.o. male admitted on 07/27/2020 with aspiration PNA.  Pharmacy has been consulted for vancomycin dosing.  vanc 1 gm given in ED earlier.  Plan: Vancomycin 1gm IV q24h (AUC 528.8, Scr 0.88, TBW) Continue zosyn 3.375gm IV q8h Daily Scr  Follow renal function, cultures and clinical course  Weight: 49.2 kg (108 lb 7.5 oz)  Temp (24hrs), Avg:98.7 F (37.1 C), Min:98.4 F (36.9 C), Max:98.8 F (37.1 C)  Recent Labs  Lab 07/27/20 1634 07/27/20 1635 07/27/20 1854  WBC  --  19.1*  --   CREATININE  --  0.88  --   LATICACIDVEN 1.2  --  0.8    Estimated Creatinine Clearance: 59 mL/min (by C-G formula based on SCr of 0.88 mg/dL).    No Known Allergies  Antimicrobials this admission: 3/7 Cefepime x1 3/7 vancomycin >> 3/8 Zosyn >>    Dose adjustments this admission:  Microbiology results: Blood Cx ordered Sputum Cx ordered Strep pneumo ordered Legionella ordered  Thank you for allowing pharmacy to be a part of this patient's care.  Dolly Rias RPh 07/28/2020, 1:00 AM

## 2020-07-28 NOTE — Consult Note (Signed)
NAME:  Ronald Ruiz, MRN:  [id number], DOB:  [birth year], LOS: 1 ADMISSION DATE:  07/27/2020, CONSULTATION DATE:  07/28/20 REFERRING MD:  Dr. Grandville Silos, CHIEF COMPLAINT:  SOB  Brief History:  [name] admitted 3/7 with reports of shortness of breath and weakness. Admitted with concern for recurrent aspiration PNA.    History of Present Illness:  [name] who presented to [facility] On 3/7 with reports of shortness of breath. He has a hx of laryngeal cancer s/p trach / radiation (since decannulated but has stoma as did not have enough sub-Q tissue for it to close), chronic hypoxic respiratory failure, PEG dependent for feeding, substance abuse on methadone & chronic aspiration followed by Dr. Vaughan Browner.   The patient was brought in by EMS with reports of SOB that began 3/6 pm.  He was recently admitted from 2/1-2/18 for PNA. That hospitalization was notable for a second left toe comminuted fracture with osteomyelitis s/p amputation by podiatry, sacral ulcer, malnutrition, persistent hypoglycemia and hypoxic resp failure in the setting of klebsiella & providencia PNA.  He was discharged on 2L [place] O2.    The patient reports he was unable to get the [facility] he was discharged on as it was 1200$.  He denies new fevers, chills, cough / sputum production. Notes that he is aware of his aspiration when he does his bolus feedings. He can feel it coming up and then he coughs it out of his trach stoma.  He notes he has slowed his feedings to see if this helps but they are concerned he isn't getting enough calories.  He does not eat or drink by mouth.   He began feeling short of breath and weak on 3/6 evening.  AM of 3/7 he felt SOB & turned it up his O2 prior to EMS arrival to 3L.  On EMS arrival, he was 88% on 3L.  EMS further increased O2 to 5L with sats of 98% on arrival to ER.       Initial work up notable for VBG 7.351 / 60 pCO2, albumin 2, CRP 12.3, PCT 0.21, WBC 13.2, Hgb 9.9, ESR 80 and pre-albumin 6.7.  CXR was  concerning for persistent heterogenous bilateral lung opacities since last month, emphysema and chronic hyperinflation.  CTA of the chest was evaluated on presentation 3/7 which was negative for PE, but showed waxing & waning of multifocal areas of consolidation seen on prior study. Overall, there has been significant progression of the basilar predominant airspace disease since prior study, findings favor progressive infection or aspiration.  Can not exclude metastatic disease.  Mucoid material throughout the mainstem bronchi and lower lobe airways.  He was admitted per [facility] and treated with IV unasyn for PNA coverage.    PCCM consulted for pulmonary evaluation.   Past Medical History:  Substance Abuse - on methadone  Lung Nodule Laryngeal Cancer  Herpes COPD - followed by Dr. Vaughan Browner   [facility] Events:  3/07 Admit  3/08 PCCM consulted for recurrent aspiration   Consults:    Procedures:     Significant Diagnostic Tests:   CTA PE 3/7 >> negative for PE, waxing & waning of multifocal areas of consolidation seen on prior study. Overall, there has been significant progression of the basilar predominant airspace disease since prior study, findings favor progressive infection or aspiration.  Can not exclude metastatic disease.  Mucoid material throughout the mainstem bronchi and lower lobe airways.   Micro Data:  COVID 3/7 >> negative  Influenza  A/B 3/7 >> negative  U. Strep Antigen 3/8 >> negative   Antimicrobials:  Cefepime 3/7 >> 3/7  Zosyn 3/8 x1  Vanco 3/8 x1  Unasyn 3/8 >>   Interim History / Subjective:  Afebrile  No acute complaints. States he does not feel as sick as he did last admission.  Daughter at bedside.   Objective   Blood pressure 105/61, pulse (!) 56, temperature 97.7 F (36.5 C), temperature source Oral, resp. rate 16, weight 49.2 kg, SpO2 99 %.        Intake/Output Summary (Last 24 hours) at 07/28/2020 1227 Last data filed at 07/28/2020  0946 Gross per 24 hour  Intake 650.64 ml  Output 825 ml  Net -174.36 ml   Filed Weights   07/27/20 2222  Weight: 49.2 kg    Examination: General: chronically ill appearing adult male sitting up in chair in NAD  HEENT: MM pink/moist, pupils 46mm, stoma midline c/d/i, temporal wasting Neuro: AAOx4, speech clear (uses tissue over stoma with pressure to phonate), MAE CV: s1s2 RRR, no m/r/g PULM: non-labored on [place] O2, lungs bilaterally diminished  GI: soft, bsx4 active  Extremities: warm/dry, 1-2+ BLE edema  Skin: no rashes or lesions  Resolved Hospital Problem list     Assessment & Plan:   Acute on Chronic Hypoxic Respiratory Failure  Recurrent Aspiration PNA  Possible Component AECOPD Laryngeal Cancer s/p Radiation, Trach (decannulated) Severe Protein Calorie Malnutrition   Discussion: 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 Irene Limbo. Trach since removed but stoma never healed with lack of soft tissue mass.  He continued to smoke prior to last admission but has since quit.  He is PEG dependent (07/24/3028) for feeding but has issues with suspected recurrent aspiration. He is followed by Dr. Vaughan Browner and was most recently seen on 03/11/20, at that time was noted to have multiple CT scans with migrating areas of consolidation consistent with ongoing aspiration events. 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  Suspect he has recurrent aspiration with bolus feedings but must also consider metastatic disease (most recent PET CT 12/2019 with residual hypermetabolic lesions). PCT only mildly elevated 0.2, ESR 80 (could be from recurrent aspiration).    -consider nocturnal feeding of 1/2 of daily dose of tube feeding and divide the remaining bolus doses throughout the day > would ask nutritionist to help with regimen.  Will need home feeding pump.   -upright positioning with feeding / aspiration precautions  -unasyn, consider 5 days total abx  -wean O2 for sats  -stop continuous O2 reading > Q4 assessment  -consider repeat PET imaging as outpatient to evaluate nodules / infiltrates  -consider evaluation of methadone dosing for reduction, ? How long he has been on current dose and with his weight loss does it have an increased impact and set him up for aspiration -continue brovana + pulmicort BID while inpatient  -continue duoneb, pulmicort as outpatient.  Yupelri not covered / too costly.  -follow intermittent CXR  -pulmonary hygiene -mobilize  -follow up with Dr. Vaughan Browner planned 3/10 > will cancel, & will need reschedule  -could consider GI study with contrast to assess for reflex   Best practice (evaluated daily)  Diet: NPO / PEG feeding  Pain/Anxiety/Delirium protocol (if indicated): n/a  VAP protocol (if indicated): n/a  DVT prophylaxis: per primary  GI prophylaxis: per primary  Glucose control: per primary  Mobility: As tolerated  Disposition:per primary   Goals of Care:  Last date of multidisciplinary goals of care discussion: per primary  Family and staff present:  Summary of discussion:  Follow up goals of care discussion due:  Code Status: Full Code   Labs   CBC: Recent Labs  Lab 07/27/20 1635 07/28/20 0059  WBC 19.1* 13.2*  NEUTROABS 17.5* 11.8*  HGB 10.8* 9.9*  HCT 36.2* 31.9*  MCV 91.6 90.6  PLT 293 258    Basic Metabolic Panel: Recent Labs  Lab 07/27/20 1635 07/28/20 0059  NA 138 137  K 4.4 4.1  CL 98 101  CO2 32 31  GLUCOSE 83 75  BUN 28* 22  CREATININE 0.88 0.73  CALCIUM 9.1 8.7*  MG  --  2.2  PHOS  --  3.3   GFR: Estimated Creatinine Clearance: 64.9 mL/min (by C-G formula based on SCr of 0.73 mg/dL). Recent Labs  Lab 07/27/20 1634 07/27/20 1635 07/27/20 1854 07/27/20 1900 07/28/20 0059  PROCALCITON  --   --   --  0.19 0.21  WBC  --  19.1*  --   --  13.2*  LATICACIDVEN 1.2   --  0.8  --   --     Liver Function Tests: Recent Labs  Lab 07/27/20 1635 07/28/20 0059  AST 17 18  ALT 12 13  ALKPHOS 84 72  BILITOT 0.4 0.6  PROT 7.4 6.4*  ALBUMIN 2.6* 2.0*   No results for input(s): LIPASE, AMYLASE in the last 168 hours. No results for input(s): AMMONIA in the last 168 hours.  ABG    Component Value Date/Time   HCO3 32.5 (H) 07/27/2020 2030   O2SAT 31.7 07/27/2020 2030     Coagulation Profile: No results for input(s): INR, PROTIME in the last 168 hours.  Cardiac Enzymes: Recent Labs  Lab 07/28/20 0050  CKTOTAL 23*    HbA1C: No results found for: HGBA1C  CBG: No results for input(s): GLUCAP in the last 168 hours.  Review of Systems: Positives in [place]   Gen: Denies fever, chills, weight change, fatigue, night sweats, weakness HEENT: Denies blurred vision, double vision, hearing loss, tinnitus, sinus congestion, rhinorrhea, sore throat, neck stiffness, dysphagia PULM: Denies shortness of breath, cough, sputum production, hemoptysis, wheezing CV: Denies chest pain, edema, orthopnea, paroxysmal nocturnal dyspnea, palpitations GI: Denies abdominal pain, nausea, vomiting, diarrhea, hematochezia, melena, constipation, change in bowel habits GU: Denies dysuria, hematuria, polyuria, oliguria, urethral discharge Endocrine: Denies hot or cold intolerance, polyuria, polyphagia or appetite change Derm: Denies rash, dry skin, scaling or peeling skin change Heme: Denies easy bruising, bleeding, bleeding gums Neuro: Denies headache, numbness, weakness, slurred speech, loss of memory or consciousness    Past Medical History:  He,  has a past medical history of Bronchitis (03/2018), COPD (chronic obstructive pulmonary disease) (HCC), Herpes, laryngeal ca (dx'd 06/2018), Lung nodule (2020), and Substance abuse (HCC).   Surgical History:   Past Surgical History:  Procedure Laterality Date  . AMPUTATION TOE Left 06/29/2020   Procedure: AMPUTATION TOE left  second toe;  Surgeon: Candelaria Stagers, DPM;  Location: [facility];  Service: Podiatry;  Laterality: Left;  . DIRECT LARYNGOSCOPY N/A 07/21/2018   Procedure: DIRECT LARYNGOSCOPY, TRACHEOSTOMY, BIOPSY;  Surgeon: Newman Pies, MD;  Location: [facility] OR;  Service: ENT;  Laterality: N/A;  . IR GASTROSTOMY TUBE MOD SED  08/02/2018     Social History:   reports that he has quit smoking. His smoking use included  cigarettes. He has a 100.00 pack-year smoking history. He has never used smokeless tobacco. He reports previous alcohol use. He reports previous drug use.   Family History:  His family history includes Alcohol abuse in his father; Cancer in his father; Cirrhosis in his father; Drug abuse in his brother.   Allergies No Known Allergies   Home Medications  Prior to Admission medications   Medication Sig Start Date End Date Taking? Authorizing Provider  acetaminophen (TYLENOL) 160 MG/5ML liquid Take 500 mg by mouth every 4 (four) hours as needed for fever.   Yes [provider]  albuterol (PROVENTIL HFA;VENTOLIN HFA) 108 (90 Base) MCG/ACT inhaler Inhale 1-2 puffs into the lungs every 6 (six) hours as needed for wheezing.  03/30/18 07/27/20 Yes [provider]  budesonide (PULMICORT) 0.5 MG/2ML nebulizer solution INHALE CONTENTS OF 1 VIAL VIA NEBULIZER 2 TIMES DAILY Patient taking differently: Take 0.5 mg by nebulization 2 (two) times daily. 10/02/18  Yes Mannam, Praveen, MD  methadone (DOLOPHINE) 10 MG/ML solution Take 120 mg by mouth daily.   Yes [provider]  Nutritional Supplements (FEEDING SUPPLEMENT, KATE FARMS STANDARD 1.4,) LIQD liquid Place 650 mLs into feeding tube 5 (five) times daily. 07/10/20 08/09/20 Yes Mercy Riding, MD  Nutritional Supplements (PROMOD) LIQD Give 30 mL Promod twice daily via PEG. Patient taking differently: Place 30 mLs into feeding tube 2 (two) times daily. 08/22/18  Yes [name], MD  pantoprazole sodium (PROTONIX) 40 mg/20 mL PACK Place 20 mLs  (40 mg total) into feeding tube daily. 07/10/20 10/08/20 Yes Gonfa, Charlesetta Ivory, MD  ipratropium-albuterol (DUONEB) 0.5-2.5 (3) MG/3ML SOLN TAKE 3 MLS BY NEBULIZATION EVERY 6 (SIX) HOURS AS NEEDED. Patient not taking: No sig reported 07/31/19   Mannam, Hart Robinsons, MD  Loperamide HCl ([employer] LOPERAMIDE HCL) 1 MG/7.5ML LIQD Place 15 mLs (2 mg total) into feeding tube every 4 (four) hours as needed (diarrhea). Patient not taking: No sig reported 07/10/20   Mercy Riding, MD  revefenacin (YUPELRI) 175 MCG/3ML nebulizer solution Take 3 mLs (175 mcg total) by nebulization daily. Patient not taking: No sig reported 07/10/20   Mercy Riding, MD     Critical care time: n/a    Noe Gens, MSN, APRN, NP-C, AGACNP-BC  Pulmonary & Critical Care 07/28/2020, 12:27 PM   Please see [email or web address] for pager details.   From 7A-7P if no response, please call [phone] After hours, please call ELink [phone]

## 2020-07-28 NOTE — Consult Note (Addendum)
WOC Nurse Consult Note: Consult requested for sacrum/Peg tube/left foot Reason for Consult: Pt is very emeciated with protruding sacrum bone.  They had a stage 3 pressure injury previously noted in Feb as present on admission, this has greatly improved.  There are 2 open wounds separated by a narrow strip of skin: total area is approx.5X.5X.1cm, 80% red, 20% yellow, small amt tan drainage.   Pressure Injury POA: Yes Pef tube site assessed; there are no open wounds, maceration, or leakage.  No role for topical treatment.  Left foot recently had surgery performed by Dr Posey Pronto of the podiatry service on 2/7. Pt has a full thickness wound to left outer foot which is yellow and may be a skin substitute location; it is difficult to determine by appearance.  5X2.5X.2cm, mod amt tan drainage Left inner foot with full thickness necrotic wound; 9X2cm, 100% black, small amt tan drainage.   Right tip of great toe with dry eschar; no topical treatment indicated.  Pt could benefit from consult to podiatry team for further plan of care since left inner foot has necrotic area; secure chat message sent to the primary team to inform them. Dressing procedure/placement/frequency: Topical treatment orders provided for bedside nurses to perform as follows to protect from further injury: Foam dressing to sacrum, change Q 3 days or PRN soiling. Apply xeroform gauze to inner and outer left foot wounds Q day, then cover with gauze and kerlex. Please re-consult if further assistance is needed.  Thank-you,  Julien Girt MSN, [place], [place], [place], [place]

## 2020-07-28 NOTE — Evaluation (Signed)
Physical Therapy Evaluation Patient Details Name: Ronald Ruiz MRN: [id number] DOB: 1956/03/22 Today's Date: 07/28/2020   History of Present Illness  65 year old gentleman history of COPD, laryngeal cancer status post radiation resection with tracheostomy since decannulated, oropharyngeal dysphagia with chronic pulmonary aspiration currently PEG dependent, substance use disorder on chronic methadone presenting on 07/27/2020 for worsening shortness of breath and O2 desaturation no higher than 88% when pt increased his home O2 from 2 to 3L. EMS increased to 5L to allow pt to reach mid 90s.   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  Clinical Impression  Pt presents with dependencies in mobility secondary to the above diagnosis. Pt is generally deconditioned and frail. Pt presents with right foot edema and necrotic tissue on the tip of the great toe. Pt is using a left postop shoe due to recent MT amputation. Pt has a supportive daughter who can assist at home however she works and he will be left alone. Pt is currently min assist with mobility. Pt reports a history of falls while using the bathroom at home. Pt will continue to benefit from acute skilled PT to maximize mobility and independence for the next venue of care. If plan is for d/c home then recommend HHPT services.    Follow Up Recommendations Supervision for mobility/OOB;SNF    Equipment Recommendations  None recommended by PT    Recommendations for Other Services       Precautions / Restrictions Precautions Precautions: Fall Precaution Comments: on Trach collar at rest. amb on RA, monitor sats, PEG tube-watch gait belt placement, sacral wound, r great toe necrotic at tip. Required Braces or Orthoses: Other Brace Other Brace: post op shoe for left second toe amputation Restrictions Weight Bearing Restrictions: No LLE Weight Bearing: Weight bearing as  tolerated Other Position/Activity Restrictions: in postop shoe      Mobility  Bed Mobility Overal bed mobility: Needs Assistance Bed Mobility: Supine to Sit     Supine to sit: Min guard;HOB elevated     General bed mobility comments: cues for technique and to manage lines    Transfers Overall transfer level: Needs assistance Equipment used: Rolling walker (2 wheeled) Transfers: Sit to/from Stand Sit to Stand: Min assist         General transfer comment: assistance to stand with RW, cues for technique and hand placement.  Ambulation/Gait Ambulation/Gait assistance: Min assist Gait Distance (Feet): 2 Feet Assistive device: Rolling walker (2 wheeled) Gait Pattern/deviations: Step-through pattern;Decreased stride length Gait velocity: decreased   General Gait Details: took a couple of sie steps and pivoted over to the recliner. Pt reported fatigue and agreeable to use portable O2 during next session to advance gait distance.  Stairs            Wheelchair Mobility    Modified Rankin (Stroke Patients Only)       Balance Overall balance assessment: History of Falls;Needs assistance Sitting-balance support: No upper extremity supported;Feet supported Sitting balance-Leahy Scale: Good     Standing balance support: Bilateral upper extremity supported;During functional activity Standing balance-Leahy Scale: Poor Standing balance comment: Reliant on BUE support on RW.                             Pertinent Vitals/Pain Pain Assessment: No/denies pain Pain Location: left foot- pt denied pain at rest and stated that pain comes and goes. Pain Intervention(s):  Monitored during session    Home Living Family/patient expects to be discharged to:: Private residence Living Arrangements: Children Available Help at Discharge: Family Type of Home: House Home Access: Stairs to enter Entrance Stairs-Rails: None Entrance Stairs-Number of Steps: 2 Home Layout:  Two level;Bed/bath upstairs Home Equipment: Walker - 2 wheels;Hand held shower head Additional Comments: Adjustable bed without rails.    Prior Function Level of Independence: Independent with assistive device(s)         Comments: lives with daughter, she reports working 50 hrs/week; pt has recently been mainly staying in his room upstairs (no downstairs bedrooms). Pt's daughter stated that they are seacrhing for a 1 story home but no luck yet. Pt reports that he ambulates with his RW as needed, and endorases more than 1 fall in last year.     Hand Dominance   Dominant Hand: Right    Extremity/Trunk Assessment   Upper Extremity Assessment Upper Extremity Assessment: Defer to OT evaluation    Lower Extremity Assessment Lower Extremity Assessment: Generalized weakness    Cervical / Trunk Assessment Cervical / Trunk Assessment: Normal Cervical / Trunk Exceptions: cachexia appearance  Communication   Communication: Tracheostomy  Cognition Arousal/Alertness: Awake/alert Behavior During Therapy: WFL for tasks assessed/performed Overall Cognitive Status: Within Functional Limits for tasks assessed                                 General Comments: Stated month as "february". otherwise oriented.      General Comments General comments (skin integrity, edema, etc.): R foot edema and great toe necrotic tissue on tip, frail and atrophy noted    Exercises     Assessment/Plan    PT Assessment Patient needs continued PT services  PT Problem List Decreased strength;Decreased mobility;Decreased activity tolerance;Decreased balance;Decreased knowledge of use of DME;Decreased range of motion;Decreased safety awareness;Decreased skin integrity;Decreased knowledge of precautions       PT Treatment Interventions DME instruction;Therapeutic exercise;Gait training;Balance training;Functional mobility training;Therapeutic activities;Patient/family education    PT Goals  (Current goals can be found in the Care Plan section)  Acute Rehab PT Goals Patient Stated Goal: to walk and get stronger PT Goal Formulation: With patient Time For Goal Achievement: 08/11/20 Potential to Achieve Goals: Good    Frequency Min 3X/week   Barriers to discharge        Co-evaluation   Reason for Co-Treatment: Complexity of the patient's impairments (multi-system involvement);For patient/therapist safety   OT goals addressed during session: ADL's and self-care       AM-PAC PT "6 Clicks" Mobility  Outcome Measure Help needed turning from your back to your side while in a flat bed without using bedrails?: A Little Help needed moving from lying on your back to sitting on the side of a flat bed without using bedrails?: A Little Help needed moving to and from a bed to a chair (including a wheelchair)?: A Little Help needed standing up from a chair using your arms (e.g., wheelchair or bedside chair)?: A Little Help needed to walk in hospital room?: A Little Help needed climbing 3-5 steps with a railing? : A Lot 6 Click Score: 17    End of Session Equipment Utilized During Treatment: Gait belt Activity Tolerance: Patient tolerated treatment well Patient left: in chair;with call bell/phone within reach Nurse Communication: Mobility status PT Visit Diagnosis: Difficulty in walking, not elsewhere classified (R26.2);Muscle weakness (generalized) (M62.81);Adult, failure to thrive (R62.7)  Time: 6237-6283 PT Time Calculation (min) (ACUTE ONLY): 22 min   Charges:   PT Evaluation $PT Eval Moderate Complexity: 1 Mod          Lelon Mast 07/28/2020, 1:03 PM

## 2020-07-28 NOTE — Progress Notes (Signed)
PROGRESS NOTE    Ronald Ruiz  LYY:[id number] DOB: 08-06-1955 DOA: 07/27/2020 PCP: [name], MD    Chief Complaint  Patient presents with  . 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 CT angio chest done on admission negative for PE, waxing and waning of multifocal areas of consolidation seen on prior study, significant progression of basilar predominant airspace disease since prior exam, findings favor progressive infection or aspiration pneumonia. Metastatic disease difficult to exclude. Tracheostomy with significant mucoid material throughout the mainstem bronchi and lower lobe airways. Patient placed empirically on IV antibiotics. PCCM consultation and podiatry consultation pending.   Assessment & Plan:   Active Problems:   Acute respiratory failure with hypoxia (HCC)   Malignant neoplasm of glottis (HCC)   Protein-calorie malnutrition, severe   Dysphagia   Dehydration   Chronic pulmonary aspiration   History of throat cancer   Pressure injury of skin   COPD exacerbation (HCC)   Aspiration pneumonia of both lower lobes due to gastric secretions (HCC)   1 acute on chronic respiratory failure with hypoxia likely secondary to recurrent aspiration pneumonia Presented back with acute on chronic respiratory failure with hypoxia felt likely secondary to recurrent aspiration pneumonia as noted by findings on CT chest.  No PE noted. 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 Patient noted to have increased O2 requirements from baseline on admission. Tube feeds on hold. MRSA PCR negative. Continue Pulmicort. Place on [place], Flonase, Robitussin twice daily per PEG, scheduled duo nebs, Claritin, PPI. Chest PT. Frequent suctioning. May need 3% saline nebs if unable to clear secretions. -Discontinue vancomycin and Zosyn. -Placed on IV Unasyn. -May need to change bolus feeds to nighttime feeds at a decreased dose in discussion with dietitian. -Likely hold tube feeds today and resume in 24 hours if continued improvement. -Due to recurrent aspiration pneumonia, findings noted on CT chest consult with pulmonary for further evaluation and management. Follow.  2. Severe protein calorie malnutrition Patient frail, cachectic, emaciated, temporal wasting. Patient on tube feeds with recurrent aspiration pneumonia. Prealbumin levels at 6.7. Placed on IV albumin x48 hours. Hold tube feeds secondary to problem #1 for the next 24 hours and resume tomorrow. Dietitian consulted.  3. COPD Continue Pulmicort. Placed on [place], scheduled DuoNebs, Claritin, PPI, Robitussin, Flonase. Consult with pulmonary.  4. Chronic aspiration Hold tube feeds for today. Likely will need to place on nighttime tube feeds at a decreased dose. Discussed with dietitian. Follow.  5. Dehydration IV fluids. Gentle hydration.  6. History of throat cancer/malignant neoplasm of the glottitis Oncology informed of admission per admitting physician. Palliative care consultation pending for goals of care.  7. Chronic pain syndrome Continue home regimen methadone.  8. Asymptomatic bradycardia Check a EKG. Follow.  9. Left foot wound Status post recent amputation of left second toe per podiatry, Dr. Posey Pronto. Patient stated outpatient follow-up was scheduled for yesterday  however had presented to the ED and as such unable to make appointment. Plain films of left foot obtained which are  consistent with a cellulitis. Patient seen by wound care nurse who recommended evaluation by podiatry team. Podiatry team consulted via secure chat and will assess patient later on today.  10. Pressure injury, POA Pressure Injury 06/23/20 Coccyx Stage 3 -  Full thickness tissue loss. Subcutaneous fat may be visible but bone, tendon or muscle are NOT exposed. Quarter sized open area on coccyx, pink with yellow slough, previously documented as an open wound (documen (Active)  06/23/20 (wound was assessed on admission but documented under wound, not pressure) 0339 (unknown exact time of first assessment)  Location: Coccyx  Location Orientation:   Staging: Stage 3 -  Full thickness tissue loss. Subcutaneous fat may be visible but bone, tendon or muscle are NOT exposed.  Wound Description (Comments): Quarter sized open area on coccyx, pink with yellow slough, previously documented as an open wound (documenting as a pressure injury due to location on bony prominence.  Present on Admission: Yes        DVT prophylaxis: Lovenox Code Status: Full Family Communication: Updated patient. No family at bedside. Disposition:   Status is: Inpatient    Dispo: The patient is from: Home.              Anticipated d/c is to: SNF (unable to get SNF during last hospitalization) versus home with home health              Patient currently with acute on chronic respiratory failure secondary to recurrent aspiration pneumonia, on IV antibiotics, consultations pending, not stable for discharge.   Difficult to place patient undetermined.       Consultants:   PCCM pending  Podiatry pending  Wound care RN  Procedures:   CT angiogram chest 07/27/2020  Chest x-ray 07/27/2020  Plain films of the left foot 07/28/2020  Antimicrobials:   IV vancomycin 07/27/2020>>>> 07/28/2020  IV Zosyn  07/28/2020>>>> 07/28/2020 x 1 dose  IV Unasyn 07/28/2020  IV cefepime 3/7/2022x1 dose   Subjective: Sleeping but arousable. Pleasant. States feeling somewhat better since admission unable to speak due to history of laryngeal cancer status post tracheostomy with decannulation. Denies chest pain. Denies abdominal pain..  Objective: Vitals:   07/28/20 0313 07/28/20 0436 07/28/20 0806 07/28/20 0853  BP:  93/60 105/61   Pulse: (!) 54 (!) 54 (!) 56   Resp: 16 18 16    Temp:  97.8 F (36.6 C) 97.7 F (36.5 C)   TempSrc:  Oral Oral   SpO2: 97% 98% 95% 99%  Weight:        Intake/Output Summary (Last 24 hours) at 07/28/2020 1106 Last data filed at 07/28/2020 0946 Gross per 24 hour  Intake 650.64 ml  Output 825 ml  Net -174.36 ml   Filed Weights   07/27/20 2222  Weight: 49.2 kg    Examination:  General exam: Frail. Cachectic. Emaciated. Temporal wasting. Respiratory system: Coarse rhonchorous breath sounds anterior lung fields. No wheezing. No crackles. Trach stoma midline. Cardiovascular system: Bradycardia. No JVD, murmurs, rubs, gallops or clicks. No pedal edema. Gastrointestinal system: Abdomen is nondistended, soft and nontender. No organomegaly or masses felt. Normal bowel sounds heard. PEG tube intact. Central nervous system: Alert and oriented. No focal neurological deficits. Extremities: 1+ bilateral pedal edema. Left foot wrapped in bandage. Skin: No rashes, lesions or ulcers Psychiatry: Judgement and insight appear normal. Mood & affect appropriate.     Data Reviewed: I have personally reviewed following labs and imaging studies  CBC: Recent Labs  Lab 07/27/20 1635 07/28/20 0059  WBC 19.1* 13.2*  NEUTROABS 17.5* 11.8*  HGB 10.8* 9.9*  HCT 36.2* 31.9*  MCV 91.6 90.6  PLT 293 175    Basic Metabolic Panel: Recent Labs  Lab 07/27/20 1635 07/28/20 0059  NA 138 137  K 4.4 4.1  CL 98 101  CO2 32 31  GLUCOSE 83 75  BUN 28* 22  CREATININE 0.88 0.73  CALCIUM 9.1  8.7*  MG  --  2.2  PHOS  --  3.3    GFR: Estimated Creatinine Clearance: 64.9 mL/min (by C-G formula based on SCr of 0.73 mg/dL).  Liver Function Tests: Recent Labs  Lab 07/27/20 1635 07/28/20 0059  AST 17 18  ALT 12 13  ALKPHOS 84 72  BILITOT 0.4 0.6  PROT 7.4 6.4*  ALBUMIN 2.6* 2.0*    CBG: No results for input(s): GLUCAP in the last 168 hours.   Recent Results (from the past 240 hour(s))  Resp Panel by RT-PCR (Flu A&B, Covid) Nasopharyngeal Swab     Status: None   Collection Time: 07/27/20  7:00 PM   Specimen: Nasopharyngeal Swab; Nasopharyngeal(NP) swabs in vial transport medium  Result Value Ref Range Status   SARS Coronavirus 2 by RT PCR NEGATIVE NEGATIVE Final    Comment: (NOTE) SARS-CoV-2 target nucleic acids are NOT DETECTED.  The SARS-CoV-2 RNA is generally detectable in upper respiratory specimens during the acute phase of infection. The lowest concentration of SARS-CoV-2 viral copies this assay can detect is 138 copies/mL. A negative result does not preclude SARS-Cov-2 infection and should not be used as the sole basis for treatment or other patient management decisions. A negative result may occur with  improper specimen collection/handling, submission of specimen other than nasopharyngeal swab, presence of viral mutation(s) within the areas targeted by this assay, and inadequate number of viral copies(<138 copies/mL). A negative result must be combined with clinical observations, patient history, and epidemiological information. The expected result is Negative.  Fact Sheet for Patients:  EntrepreneurPulse.com.au  Fact Sheet for Healthcare Providers:  IncredibleEmployment.be  This test is no t yet approved or cleared by the [place] FDA and  has been authorized for detection and/or diagnosis of SARS-CoV-2 by FDA under an Emergency Use Authorization (EUA). This EUA will remain  in effect (meaning this test can  be used) for the duration of the COVID-19 declaration under Section 564(b)(1) of the Act, 21 U.S.C.section 360bbb-3(b)(1), unless the authorization is terminated  or revoked sooner.       Influenza A by PCR NEGATIVE NEGATIVE Final   Influenza B by PCR NEGATIVE NEGATIVE Final    Comment: (NOTE) The Xpert Xpress SARS-CoV-2/FLU/RSV plus assay is intended as an aid in the diagnosis of influenza from Nasopharyngeal swab specimens and should not be used as a sole basis for treatment. Nasal washings and aspirates are unacceptable for Xpert Xpress SARS-CoV-2/FLU/RSV testing.  Fact Sheet for Patients: EntrepreneurPulse.com.au  Fact Sheet for Healthcare Providers: IncredibleEmployment.be  This test is not yet approved or cleared by the [place] FDA and has been authorized for detection and/or diagnosis of SARS-CoV-2 by FDA under an Emergency Use Authorization (EUA). This EUA will remain in effect (meaning this test can be used) for the duration of the COVID-19 declaration under Section 564(b)(1) of the Act, 21 U.S.C. section 360bbb-3(b)(1), unless the authorization is terminated or revoked.  Performed at [facility], [place] [address], [place], [place] [address]   MRSA PCR Screening     Status: None   Collection Time: 07/28/20  2:00 AM   Specimen: Nasal Mucosa; Nasopharyngeal  Result Value Ref Range Status   MRSA by PCR NEGATIVE NEGATIVE Final    Comment:        The GeneXpert MRSA Assay (FDA approved for NASAL specimens only), is one component of a comprehensive MRSA colonization surveillance program. It is not intended to diagnose MRSA infection nor to guide or monitor treatment for MRSA infections. Performed at [facility], [place] [address] [place], [place] [address]          Radiology Studies: CT Angio Chest PE W and/or Wo Contrast  Result Date: 07/27/2020 CLINICAL DATA:  Short of breath  since this morning, productive drainage from tracheostomy EXAM: CT ANGIOGRAPHY CHEST WITH CONTRAST TECHNIQUE: Multidetector CT imaging of the chest was performed using the standard protocol during bolus administration of intravenous contrast. Multiplanar CT image reconstructions and MIPs were obtained to evaluate the vascular anatomy. CONTRAST:  196mL OMNIPAQUE IOHEXOL 350 MG/ML SOLN COMPARISON:  07/27/2020, 03/03/2020 FINDINGS: Cardiovascular: This is a technically adequate evaluation of the pulmonary vasculature. No filling defects or pulmonary emboli. The heart is unremarkable without pericardial effusion. Extensive atherosclerosis of the coronary vasculature. The thoracic aorta is unremarkable without aneurysm or dissection. Mild atherosclerosis of the aortic arch. Mediastinum/Nodes: No pathologic adenopathy. The thyroid and esophagus are grossly normal. Tracheostomy is identified. There is mucoid material within the mainstem bronchi bilaterally. Lungs/Pleura: Extensive background emphysema is again identified. There is waxing and waning appearance of the multiple nodular areas of consolidation seen previously. In area of dense consolidation within the lateral basilar segment of the right lower lobe on prior study measuring 8.8 x 4.0 cm has near completely resolved, with small residual cavitary areas remaining, measuring 2.1 x 1.1 cm image 94/6. The small nodule with central cavitation in the right lower lobe on prior study image 122/3 has increased in size now measuring 2.4 x 1.8 cm. New dense areas of consolidation are seen within the left lower lobe and lingula. There is progressive bilateral bronchiectasis. Overall, the appearance suggests progressive infection, including aspiration or atypical organisms such as fungal pneumonia. Given history of head and neck cancer, metastatic disease would be difficult to exclude, and close follow-up is warranted. There is no effusion or pneumothorax. Upper Abdomen:  Percutaneous gastrostomy tube is identified. No acute upper abdominal findings. Musculoskeletal: The patient is markedly cachectic. There are no acute or destructive bony lesions. Reconstructed images demonstrate no additional findings. Review of the MIP images confirms the above findings. IMPRESSION: 1. No evidence of pulmonary embolus. 2. Waxing and waning of multifocal areas of consolidation seen on prior study. Overall, there has been significant progression of the basilar predominant airspace disease since prior exam. The findings favor progressive infection or aspiration pneumonia. Metastatic disease would be difficult to exclude, and close imaging follow-up is recommended. 3. Tracheostomy, with significant mucoid material throughout the mainstem bronchi and lower lobe airways. 4. Aortic Atherosclerosis (ICD10-I70.0). Coronary artery atherosclerosis. Electronically Signed   By: Randa Ngo M.D.   On: 07/27/2020 21:51   DG Chest Portable 1 View  Result Date: 07/27/2020 CLINICAL DATA:  Shortness of breath. EXAM: PORTABLE CHEST 1 VIEW COMPARISON:  Most recent radiograph 06/23/2020.  Chest CT 03/03/2020 FINDINGS: Emphysema with chronic hyperinflation and bronchial thickening. Persistent patchy bilateral airspace opacities. New patchy airspace disease at the left lung base. There is biapical pleuroparenchymal scarring. Heart is normal in size. Blunting of the costophrenic angles was seen previously and likely related to hyperinflation. No large effusion. No  pneumothorax. No pulmonary edema. IMPRESSION: 1. Persistent heterogeneous bilateral lung opacities since radiographs last month, with new left basilar airspace disease. Differential considerations include pneumonia (including aspiration) versus malignancy. 2. Emphysema with chronic hyperinflation. Electronically Signed   By: Keith Rake M.D.   On: 07/27/2020 17:32   DG Foot 2 Views Left  Result Date: 07/28/2020 CLINICAL DATA:  Left foot wound EXAM:  LEFT FOOT - 2 VIEW COMPARISON:  06/29/2020 FINDINGS: Two view radiograph left foot demonstrates surgical changes of second digit amputation distal to the second metatarsophalangeal joint. Moderate first ray bunion deformity is seen with moderate degenerative arthritis of the first metatarsophalangeal joint. No acute fracture or dislocation. No destructive osseous lesion. There is moderate soft tissue swelling of the left forefoot diffusely. Moderate bimalleolar soft tissue swelling is incidentally noted. IMPRESSION: Status post second digit amputation. Diffuse soft tissue swelling. No focal destructive osseous lesion identified to suggest focal osteomyelitis. Electronically Signed   By: Fidela Salisbury MD   On: 07/28/2020 02:57        Scheduled Meds: . arformoterol  15 mcg Nebulization BID  . budesonide  0.5 mg Nebulization BID  . enoxaparin (LOVENOX) injection  40 mg Subcutaneous Q24H  . fluticasone  2 spray Each Nare Daily  . guaiFENesin  10 mL Per Tube BID  . ipratropium  0.5 mg Nebulization Q6H  . levalbuterol  0.63 mg Nebulization Q6H  . loratadine  10 mg Per Tube Daily  . methadone  120 mg Oral Daily  . pantoprazole sodium  40 mg Per Tube Daily  . [START ON 07/29/2020] thiamine  100 mg Per Tube Daily   Continuous Infusions: . sodium chloride 75 mL/hr (07/28/20 0600)  . albumin human    . ampicillin-sulbactam (UNASYN) IV 3 g (07/28/20 0944)     LOS: 1 day    Time spent: 40 minutes    Irine Seal, MD [facility]   To contact the attending provider between 7A-7P or the covering provider during after hours 7P-7A, please log into the web site [email or web address] and access using universal [place] password for that web site. If you do not have the password, please call the hospital operator.  07/28/2020, 11:06 AM

## 2020-07-28 NOTE — Progress Notes (Signed)
Initial Nutrition Assessment  DOCUMENTATION CODES:   Severe malnutrition in context of chronic illness,Underweight  INTERVENTION:   -Recommend addition of Reglan to aid in GI motility  3/9: trial new regimen to lower total volumes of individual feeds -Initiate [employer] 1.4 @ 100 ml/hr x 16 hours (1600 ml or 5 cartons)  Run 1800-1000.   -Provide 325  ml (1 cartons) [employer] TID via PEG  At 1200, 1430 and 1630. -Free water flushes of 30 ml before and after each bolus (180 ml)   Goal will be to run [employer] 1.4 @ 120 ml/hr x 16 hours (1800-1000) and bolus feeds of 325 ml [employer] 1.4 TID via PEG. Provides 4095 kcals, 180g protein and 2286 ml H2O.  NUTRITION DIAGNOSIS:   Severe Malnutrition related to chronic illness,cancer and cancer related treatments as evidenced by severe fat depletion,severe muscle depletion.  GOAL:   Patient will meet greater than or equal to 90% of their needs  MONITOR:   Labs,Weight trends,TF tolerance,I & O's  REASON FOR ASSESSMENT:   Consult Assessment of nutrition requirement/status,Enteral/tube feeding initiation and management  ASSESSMENT:   65 y.o. 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 Admitted for aspiration pneumonia.  Patient in room, states he feels "so-so". Pt's daughter at bedside. Per pt, he has been tolerating [place] but his daughter states that he has stated it has been "coming back up" as in like reflux d/t the high volume amounts of each of his feedings. Pt and daughter report he is never lying flat and sleeps elevated.  Pt and daughter both in agreement to trial a combination of bolus and continuous pump feedings to lower his feeding volumes.   Also wonder if Reglan could help with motility of  feeds.  Per Dr. [place] Silos, tube feedings to be held today and can resume tomorrow. Discussed starting on night feed regimen.  If unable to tolerate this combination regimen, may need to switch to 24 hours continuous feeds to lessen aspiration risk.   With home regimen of 650 ml [employer] 1.4 fives times daily, this has provided 4550 kcals and 200g protein.   Per weight records, pt has lost 5 lbs since 2/14 (4% wt loss x almost a month, insignificant for time frame).   Medications: Thiamine  Labs reviewed:    NUTRITION - FOCUSED PHYSICAL EXAM:  Flowsheet Row Most Recent Value  Orbital Region Severe depletion  Upper Arm Region Severe depletion  Thoracic and Lumbar Region Severe depletion  Buccal Region Severe depletion  Temple Region Severe depletion  Clavicle Bone Region Severe depletion  Clavicle and Acromion Bone Region Severe depletion  Scapular Bone Region Severe depletion  Dorsal Hand Severe depletion  Patellar Region Severe depletion  Anterior Thigh Region Severe depletion  Posterior Calf Region Severe depletion  Edema (RD Assessment) Severe       Diet Order:   Diet Order            Diet NPO time specified  Diet effective now                 EDUCATION NEEDS:   Education needs have been addressed  Skin:   stg 3 sacrum, skin tears, left necrotic wound on foot  Last BM:  3/7  Height:   Ht Readings from Last 1 Encounters:  07/28/20  6' (1.829 m)    Weight:   Wt Readings from Last 1 Encounters:  07/28/20 49.2 kg    BMI:  Body mass index is 14.71 kg/m.  Estimated Nutritional Needs:   Kcal:  2800-3000  Protein:  160-180g  Fluid:  >2.5L/day  Clayton Bibles, MS, RD, LDN Inpatient Clinical Dietitian Contact information available via Amion

## 2020-07-29 DIAGNOSIS — Z7189 Other specified counseling: Secondary | ICD-10-CM

## 2020-07-29 DIAGNOSIS — Z85819 Personal history of malignant neoplasm of unspecified site of lip, oral cavity, and pharynx: Secondary | ICD-10-CM

## 2020-07-29 LAB — CBC WITH DIFFERENTIAL/PLATELET
Abs Immature Granulocytes: 0.06 10*3/uL (ref 0.00–0.07)
Basophils Absolute: 0 10*3/uL (ref 0.0–0.1)
Basophils Relative: 0 %
Eosinophils Absolute: 0 10*3/uL (ref 0.0–0.5)
Eosinophils Relative: 0 %
HCT: 37.9 % — ABNORMAL LOW (ref 39.0–52.0)
Hemoglobin: 11.3 g/dL — ABNORMAL LOW (ref 13.0–17.0)
Immature Granulocytes: 1 %
Lymphocytes Relative: 6 %
Lymphs Abs: 0.7 10*3/uL (ref 0.7–4.0)
MCH: 27.4 pg (ref 26.0–34.0)
MCHC: 29.8 g/dL — ABNORMAL LOW (ref 30.0–36.0)
MCV: 91.8 fL (ref 80.0–100.0)
Monocytes Absolute: 0.5 10*3/uL (ref 0.1–1.0)
Monocytes Relative: 4 %
Neutro Abs: 11.2 10*3/uL — ABNORMAL HIGH (ref 1.7–7.7)
Neutrophils Relative %: 89 %
Platelets: 262 10*3/uL (ref 150–400)
RBC: 4.13 MIL/uL — ABNORMAL LOW (ref 4.22–5.81)
RDW: 19.4 % — ABNORMAL HIGH (ref 11.5–15.5)
WBC: 12.4 10*3/uL — ABNORMAL HIGH (ref 4.0–10.5)
nRBC: 0 % (ref 0.0–0.2)

## 2020-07-29 LAB — GLUCOSE, CAPILLARY: Glucose-Capillary: 86 mg/dL (ref 70–99)

## 2020-07-29 LAB — RENAL FUNCTION PANEL
Albumin: 3.1 g/dL — ABNORMAL LOW (ref 3.5–5.0)
Anion gap: 16 — ABNORMAL HIGH (ref 5–15)
BUN: 25 mg/dL — ABNORMAL HIGH (ref 8–23)
CO2: 24 mmol/L (ref 22–32)
Calcium: 9.2 mg/dL (ref 8.9–10.3)
Chloride: 103 mmol/L (ref 98–111)
Creatinine, Ser: 0.81 mg/dL (ref 0.61–1.24)
GFR, Estimated: >60 mL/min (ref >60)
Glucose, Bld: 43 mg/dL — CL (ref 70–99)
Phosphorus: 4.7 mg/dL — ABNORMAL HIGH (ref 2.5–4.6)
Potassium: 4 mmol/L (ref 3.5–5.1)
Sodium: 143 mmol/L (ref 135–145)

## 2020-07-29 LAB — PROCALCITONIN: Procalcitonin: 0.1 ng/mL

## 2020-07-29 LAB — RHEUMATOID FACTOR: Rheumatoid fact SerPl-aCnc: 340 IU/mL — ABNORMAL HIGH (ref <14.0)

## 2020-07-29 LAB — GLOMERULAR BASEMENT MEMBRANE ANTIBODIES: GBM Ab: 3 units (ref 0–20)

## 2020-07-29 LAB — MAGNESIUM: Magnesium: 2.3 mg/dL (ref 1.7–2.4)

## 2020-07-29 MED ORDER — KATE FARMS STANDARD 1.4 PO LIQD
325.0000 mL | Freq: Three times a day (TID) | ORAL | Status: DC
  Administered 2020-07-29 – 2020-07-31 (×7): 325 mL
  Filled 2020-07-29 (×9): qty 325

## 2020-07-29 MED ORDER — KATE FARMS STANDARD 1.4 PO LIQD
1600.0000 mL | ORAL | Status: DC
  Administered 2020-07-29: 1600 mL
  Filled 2020-07-29 (×4): qty 1625

## 2020-07-29 MED ORDER — DEXTROSE 50 % IV SOLN
1.0000 | Freq: Once | INTRAVENOUS | Status: AC
  Administered 2020-07-29: 50 mL via INTRAVENOUS
  Filled 2020-07-29: qty 50

## 2020-07-29 MED ORDER — KATE FARMS STANDARD 1.4 PO LIQD
1600.0000 mL | ORAL | Status: DC
  Filled 2020-07-29: qty 1625

## 2020-07-29 NOTE — Progress Notes (Signed)
PROGRESS NOTE    Ronald Ruiz  FBP:[id number] DOB: May 26, 1955 DOA: 07/27/2020 PCP: [name], MD    Chief Complaint  Patient presents with  . 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 CT angio chest done on admission negative for PE, waxing and waning of multifocal areas of consolidation seen on prior study, significant progression of basilar predominant airspace disease since prior exam, findings favor progressive infection or aspiration pneumonia. Metastatic disease difficult to exclude. Tracheostomy with significant mucoid material throughout the mainstem bronchi and lower lobe airways. Patient placed empirically on IV antibiotics. PCCM consultation and podiatry consultation following   Assessment & Plan:   Active Problems:   Malignant neoplasm of glottis (HCC)   Protein-calorie malnutrition, severe   Dysphagia   Dehydration   Chronic pulmonary aspiration   History of throat cancer   Acute respiratory failure with hypoxia (HCC)   Pressure injury of skin   COPD exacerbation (HCC)   Aspiration pneumonia of both lower lobes due to gastric secretions (HCC)  Acute on chronic respiratory failure with hypoxia likely secondary to recurrent aspiration pneumonia - Presented back with acute on chronic respiratory failure with hypoxia felt likely secondary to recurrent aspiration pneumonia as noted by findings on CT chest. No  PE noted. 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 Patient noted to have increased O2 requirements from baseline on admission. - Tube feeds on hold initially, resuming today at a higher frequency given hypoglycemia as below -Vancomycin and Zosyn previously discontinued, currently on day 2 of unasyn -transition to Augmentin via PEG tube for DC in the next 24 to 48 hours -Dietary following, increasing to bolus feeds as well as overnight continuous feed for calorie needs given severe protein caloric malnutrition.  Severe protein calorie malnutrition Patient frail, cachectic, emaciated, temporal wasting.  Increase tube feeds as above.  COPD without acute exacerbation - Continue Pulmicort. Placed on Brovana, scheduled DuoNebs, Claritin, PPI, Robitussin, Flonase. -Acute hypoxia as above likely in the setting of aspiration due to tube feeds  Acute on chronic aspiration Resume tube feeds per dietary, bolus during the day with continuous feeds at night, education on the patient's part for remaining upright and avoiding any reclined or supine position which would increase risk of aspiration.  History of throat cancer/malignant neoplasm of the glottitis Oncology informed of admission per admitting physician. Palliative care consultation pending for goals of care.  Chronic pain syndrome Continue home regimen methadone.  Asymptomatic bradycardia Check a EKG. Follow.  Left foot wound/cellulitis Status post recent amputation of left second toe per podiatry w/ Dr. Posey Pronto. Plain films of left foot obtained which are consistent with a cellulitis. Patient seen by wound care nurse who recommended evaluation by podiatry team.  - Podiatry team previously consulted via secure chat - awaiting recommendations.  Pressure injury, POA Pressure Injury 06/23/20 Coccyx Stage 3 -  Full thickness tissue loss. Subcutaneous fat may be  visible but bone, tendon or muscle are NOT exposed. Quarter sized open area on coccyx, pink with yellow  slough, previously documented as an open wound (documen (Active)  06/23/20 (wound was assessed on admission but documented under wound, not pressure) 0339 (unknown exact time of first assessment)  Location: Coccyx  Location Orientation:   Staging: Stage 3 -  Full thickness tissue loss. Subcutaneous fat may be visible but bone, tendon or muscle are NOT exposed.  Wound Description (Comments): Quarter sized open area on coccyx, pink with yellow slough, previously documented as an open wound (documenting as a pressure injury due to location on bony prominence.  Present on Admission: Yes    DVT prophylaxis: Lovenox Code Status: Full Family Communication: Updated patient. No family at bedside. Disposition:   Status is: Inpatient    Dispo: The patient is from: Home.              Anticipated d/c is to: Home with home health per patient wishes              Patient currently with acute on chronic respiratory failure secondary to recurrent aspiration pneumonia, on IV antibiotics, consultations pending, not stable for discharge.   Consultants:   PCCM   Podiatry  Wound care RN  Procedures:   CT angiogram chest 07/27/2020  Chest x-ray 07/27/2020  Plain films of the left foot 07/28/2020  Antimicrobials:   IV vancomycin 07/27/2020>>>> 07/28/2020  IV Zosyn 07/28/2020>>>> 07/28/2020 x 1 dose  IV Unasyn 07/28/2020  IV cefepime 3/7/2022x1 dose   Subjective: No acute issues or events overnight denies nausea vomiting diarrhea constipation headache fevers or chills.  Objective: Vitals:   07/28/20 2152 07/29/20 0057 07/29/20 0300 07/29/20 0455  BP: 116/61 123/65  129/79  Pulse: (!) 55 63  74  Resp: 20 16  16   Temp:  97.7 F (36.5 C)    TempSrc:      SpO2: 95% 97% 97% 94%  Weight:      Height:        Intake/Output Summary (Last 24 hours) at 07/29/2020 0709 Last data filed at 07/29/2020  6144 Gross per 24 hour  Intake 1373.66 ml  Output 1625 ml  Net -251.34 ml   Filed Weights   07/27/20 2222 07/28/20 1429  Weight: 49.2 kg 49.2 kg    Examination:  General exam: Frail. Cachectic. Emaciated. Temporal wasting. Respiratory system: Coarse rhonchorous breath sounds anterior lung fields. No wheezing. No crackles. Trach stoma midline without erythema or purulence. Cardiovascular system: Bradycardia. No JVD, murmurs, rubs, gallops or clicks. No pedal edema. Gastrointestinal system: Abdomen is nondistended, soft and nontender. No organomegaly or masses felt. Normal bowel sounds heard. PEG tube intact. Central nervous system: Alert and oriented. No focal neurological deficits. Extremities: 1+ bilateral pedal edema. Left foot wrapped in bandage. Skin: No rashes, lesions or ulcers Psychiatry: Judgement and insight appear normal. Mood & affect appropriate.     Data Reviewed: I have personally reviewed following labs and imaging studies  CBC: Recent Labs  Lab 07/27/20 1635 07/28/20 0059 07/29/20 0525  WBC 19.1* 13.2* 12.4*  NEUTROABS 17.5* 11.8* 11.2*  HGB 10.8* 9.9* 11.3*  HCT 36.2* 31.9* 37.9*  MCV 91.6 90.6 91.8  PLT 293 258 315    Basic Metabolic Panel: Recent Labs  Lab 07/27/20 1635 07/28/20 0059 07/29/20 0525  NA 138 137 143  K 4.4 4.1 4.0  CL 98 101 103  CO2 32 31 24  GLUCOSE 83 75 43*  BUN 28* 22 25*  CREATININE 0.88 0.73 0.81  CALCIUM 9.1 8.7* 9.2  MG  --  2.2 2.3  PHOS  --  3.3 4.7*    GFR: Estimated Creatinine Clearance: 64.1 mL/min (by C-G formula based on SCr of 0.81 mg/dL).  Liver Function Tests: Recent Labs  Lab 07/27/20 1635 07/28/20 0059 07/29/20 0525  AST 17 18  --   ALT 12 13  --   ALKPHOS 84 72  --   BILITOT 0.4 0.6  --   PROT 7.4 6.4*  --   ALBUMIN 2.6* 2.0* 3.1*    CBG: No results for input(s): GLUCAP in the last 168 hours.   Recent Results (from the past 240 hour(s))  Resp Panel by RT-PCR (Flu A&B, Covid)  Nasopharyngeal Swab     Status: None   Collection Time: 07/27/20  7:00 PM   Specimen: Nasopharyngeal Swab; Nasopharyngeal(NP) swabs in vial transport medium  Result Value Ref Range Status   SARS Coronavirus 2 by RT PCR NEGATIVE NEGATIVE Final    Comment: (NOTE) SARS-CoV-2 target nucleic acids are NOT DETECTED.  The SARS-CoV-2 RNA is generally detectable in upper respiratory specimens during the acute phase of infection. The lowest concentration of SARS-CoV-2 viral copies this assay can detect is 138 copies/mL. A negative result does not preclude SARS-Cov-2 infection and should not be used as the sole basis for treatment or other patient management decisions. A negative result may occur with  improper specimen collection/handling, submission of specimen other than nasopharyngeal swab, presence of viral mutation(s) within the areas targeted by this assay, and inadequate number of viral copies(<138 copies/mL). A negative result must be combined with clinical observations, patient history, and epidemiological information. The expected result is Negative.  Fact Sheet for Patients:  EntrepreneurPulse.com.au  Fact Sheet for Healthcare Providers:  IncredibleEmployment.be  This test is no t yet approved or cleared by the [place] FDA and  has been authorized for detection and/or diagnosis of SARS-CoV-2 by FDA under an Emergency Use Authorization (EUA). This EUA will remain  in effect (meaning this test can be used) for the duration of the COVID-19 declaration under Section 564(b)(1) of the Act, 21 U.S.C.section 360bbb-3(b)(1), unless the authorization is terminated  or revoked sooner.       Influenza A by PCR NEGATIVE NEGATIVE Final   Influenza B by PCR NEGATIVE NEGATIVE Final    Comment: (NOTE) The Xpert Xpress SARS-CoV-2/FLU/RSV plus assay is intended as an aid in the diagnosis of influenza from Nasopharyngeal swab specimens and should not be  used as a sole basis for treatment. Nasal washings and aspirates are unacceptable for Xpert Xpress SARS-CoV-2/FLU/RSV testing.  Fact Sheet for Patients: EntrepreneurPulse.com.au  Fact Sheet for Healthcare Providers: IncredibleEmployment.be  This test is not yet approved or cleared by the [place] FDA and has been authorized for detection and/or diagnosis of SARS-CoV-2 by FDA under an Emergency Use Authorization (EUA). This EUA will remain in effect (meaning this test can be used) for the duration of the COVID-19 declaration under Section 564(b)(1) of the Act, 21 U.S.C. section 360bbb-3(b)(1), unless the authorization is terminated or revoked.  Performed at [facility], [place] [address] [place], [place] [address]   MRSA PCR Screening     Status: None   Collection Time: 07/28/20  2:00 AM   Specimen: Nasal Mucosa; Nasopharyngeal  Result Value Ref Range Status   MRSA by PCR NEGATIVE NEGATIVE Final    Comment:        The GeneXpert MRSA Assay (FDA approved for NASAL specimens only), is one component of a comprehensive MRSA colonization surveillance program. It is not intended to diagnose MRSA  infection nor to guide or monitor treatment for MRSA infections. Performed at [facility], [place] [address] [place], [place] [address]   Expectorated Sputum Assessment w Gram Stain, Rflx to Resp Cult     Status: None   Collection Time: 07/28/20  1:57 PM  Result Value Ref Range Status   Specimen Description EXPECTORATED SPUTUM  Final   Special Requests NONE  Final   Sputum evaluation   Final    THIS SPECIMEN IS ACCEPTABLE FOR SPUTUM CULTURE Performed at [facility], [place] [address] [place], [place] [address]    Report Status 07/28/2020 FINAL  Final  Culture, Respiratory w Gram Stain     Status: None (Preliminary result)   Collection Time: 07/28/20  1:57 PM  Result Value Ref Range Status    Specimen Description   Final    EXPECTORATED SPUTUM Performed at [place] [address] [place], [place] [address]    Special Requests   Final    NONE Reflexed from [id number] Performed at [place] [address] [place], [place] [address]    Gram Stain   Final    ABUNDANT WBC PRESENT, PREDOMINANTLY PMN ABUNDANT GRAM POSITIVE RODS MODERATE GRAM POSITIVE COCCI Performed at [facility], [place] [address], [place], [place] [address]    Culture PENDING  Incomplete   Report Status PENDING  Incomplete         Radiology Studies: CT Angio Chest PE W and/or Wo Contrast  Result Date: 07/27/2020 CLINICAL DATA:  Short of breath since this morning, productive drainage from tracheostomy EXAM: CT ANGIOGRAPHY CHEST WITH CONTRAST TECHNIQUE: Multidetector CT imaging of the chest was performed using the standard protocol during bolus administration of intravenous contrast. Multiplanar CT image reconstructions and MIPs were obtained to evaluate the vascular anatomy. CONTRAST:  141mL OMNIPAQUE IOHEXOL 350 MG/ML SOLN COMPARISON:  07/27/2020, 03/03/2020 FINDINGS: Cardiovascular: This is a technically adequate evaluation of the pulmonary vasculature. No filling defects or pulmonary emboli. The heart is unremarkable without pericardial effusion. Extensive atherosclerosis of the coronary vasculature. The thoracic aorta is unremarkable without aneurysm or dissection. Mild atherosclerosis of the aortic arch. Mediastinum/Nodes: No pathologic adenopathy. The thyroid and esophagus are grossly normal. Tracheostomy is identified. There is mucoid material within the mainstem bronchi bilaterally. Lungs/Pleura: Extensive background emphysema is again identified. There is waxing and waning appearance of the multiple nodular areas of consolidation seen previously. In area of dense consolidation within the lateral basilar segment of the right lower lobe on prior study measuring  8.8 x 4.0 cm has near completely resolved, with small residual cavitary areas remaining, measuring 2.1 x 1.1 cm image 94/6. The small nodule with central cavitation in the right lower lobe on prior study image 122/3 has increased in size now measuring 2.4 x 1.8 cm. New dense areas of consolidation are seen within the left lower lobe and lingula. There is progressive bilateral bronchiectasis. Overall, the appearance suggests progressive infection, including aspiration or atypical organisms such as fungal pneumonia. Given history of head and neck cancer, metastatic disease would be difficult to exclude, and close follow-up is warranted. There is no effusion or pneumothorax. Upper Abdomen: Percutaneous gastrostomy tube is identified. No acute upper abdominal findings. Musculoskeletal: The patient is markedly cachectic. There are no acute or destructive bony lesions. Reconstructed images demonstrate no additional findings. Review of the MIP images confirms the above findings. IMPRESSION: 1. No evidence of pulmonary embolus. 2. Waxing and waning of multifocal areas of  consolidation seen on prior study. Overall, there has been significant progression of the basilar predominant airspace disease since prior exam. The findings favor progressive infection or aspiration pneumonia. Metastatic disease would be difficult to exclude, and close imaging follow-up is recommended. 3. Tracheostomy, with significant mucoid material throughout the mainstem bronchi and lower lobe airways. 4. Aortic Atherosclerosis (ICD10-I70.0). Coronary artery atherosclerosis. Electronically Signed   By: Randa Ngo M.D.   On: 07/27/2020 21:51   DG Chest Portable 1 View  Result Date: 07/27/2020 CLINICAL DATA:  Shortness of breath. EXAM: PORTABLE CHEST 1 VIEW COMPARISON:  Most recent radiograph 06/23/2020.  Chest CT 03/03/2020 FINDINGS: Emphysema with chronic hyperinflation and bronchial thickening. Persistent patchy bilateral airspace opacities.  New patchy airspace disease at the left lung base. There is biapical pleuroparenchymal scarring. Heart is normal in size. Blunting of the costophrenic angles was seen previously and likely related to hyperinflation. No large effusion. No pneumothorax. No pulmonary edema. IMPRESSION: 1. Persistent heterogeneous bilateral lung opacities since radiographs last month, with new left basilar airspace disease. Differential considerations include pneumonia (including aspiration) versus malignancy. 2. Emphysema with chronic hyperinflation. Electronically Signed   By: Keith Rake M.D.   On: 07/27/2020 17:32   DG Foot 2 Views Left  Result Date: 07/28/2020 CLINICAL DATA:  Left foot wound EXAM: LEFT FOOT - 2 VIEW COMPARISON:  06/29/2020 FINDINGS: Two view radiograph left foot demonstrates surgical changes of second digit amputation distal to the second metatarsophalangeal joint. Moderate first ray bunion deformity is seen with moderate degenerative arthritis of the first metatarsophalangeal joint. No acute fracture or dislocation. No destructive osseous lesion. There is moderate soft tissue swelling of the left forefoot diffusely. Moderate bimalleolar soft tissue swelling is incidentally noted. IMPRESSION: Status post second digit amputation. Diffuse soft tissue swelling. No focal destructive osseous lesion identified to suggest focal osteomyelitis. Electronically Signed   By: Fidela Salisbury MD   On: 07/28/2020 02:57        Scheduled Meds: . arformoterol  15 mcg Nebulization BID  . budesonide  0.5 mg Nebulization BID  . chlorhexidine  15 mL Mouth Rinse BID  . enoxaparin (LOVENOX) injection  40 mg Subcutaneous Q24H  . fluticasone  2 spray Each Nare Daily  . guaiFENesin  10 mL Per Tube BID  . ipratropium-albuterol  3 mL Nebulization Q6H  . loratadine  10 mg Per Tube Daily  . mouth rinse  15 mL Mouth Rinse q12n4p  . methadone  120 mg Oral Daily  . pantoprazole sodium  40 mg Per Tube Daily  . thiamine  100  mg Per Tube Daily   Continuous Infusions: . albumin human 25 g (07/29/20 0534)  . ampicillin-sulbactam (UNASYN) IV 3 g (07/29/20 0450)     LOS: 2 days    Time spent: 40 minutes  Little Ishikawa, DO [facility]  Pager: Secure chat  07/29/2020, 7:09 AM

## 2020-07-29 NOTE — Progress Notes (Signed)
Pt did CPT at this time.

## 2020-07-29 NOTE — Progress Notes (Signed)
Pt refused CPT at this time.

## 2020-07-29 NOTE — Plan of Care (Signed)

## 2020-07-29 NOTE — Progress Notes (Signed)
Received call from lab with critical glucose of 43.  Contacted on call provider with value and update on patients status.  New orders received for D50 25gm.  Medication administered per orders and will recheck within 30 minutes.  Report given to oncoming nurse to monitor and follow-up.

## 2020-07-30 ENCOUNTER — Telehealth: Payer: Self-pay | Admitting: Internal Medicine

## 2020-07-30 ENCOUNTER — Ambulatory Visit: Payer: 59 | Admitting: Pulmonary Disease

## 2020-07-30 DIAGNOSIS — L97529 Non-pressure chronic ulcer of other part of left foot with unspecified severity: Secondary | ICD-10-CM | POA: Diagnosis not present

## 2020-07-30 DIAGNOSIS — Z89422 Acquired absence of other left toe(s): Secondary | ICD-10-CM

## 2020-07-30 DIAGNOSIS — J9621 Acute and chronic respiratory failure with hypoxia: Secondary | ICD-10-CM | POA: Diagnosis not present

## 2020-07-30 LAB — LEGIONELLA PNEUMOPHILA SEROGP 1 UR AG: L. pneumophila Serogp 1 Ur Ag: NEGATIVE

## 2020-07-30 LAB — ANCA TITERS
Atypical P-ANCA titer: <1:20 {titer}
C-ANCA: <1:20 {titer}
P-ANCA: <1:20 {titer}

## 2020-07-30 LAB — MPO/PR-3 (ANCA) ANTIBODIES
ANCA Proteinase 3: <3.5 U/mL (ref 0.0–3.5)
Myeloperoxidase Abs: <9 U/mL (ref 0.0–9.0)

## 2020-07-30 MED ORDER — ACETAMINOPHEN 650 MG RE SUPP: 650.0000 mg | Freq: Four times a day (QID) | RECTAL | Status: DC | PRN

## 2020-07-30 MED ORDER — ACETAMINOPHEN 325 MG PO TABS: 650.0000 mg | ORAL_TABLET | Freq: Four times a day (QID) | ORAL | Status: DC | PRN

## 2020-07-30 MED ORDER — AMOXICILLIN-POT CLAVULANATE 400-57 MG/5ML PO SUSR
800.0000 mg | Freq: Two times a day (BID) | ORAL | Status: DC
  Administered 2020-07-30 – 2020-07-31 (×3): 800 mg
  Filled 2020-07-30 (×3): qty 10

## 2020-07-30 NOTE — Consult Note (Signed)
Reason for Consult: Left foot wounds, s/p 2nd toe amputation Referring Physician: [name] is an 65 y.o. male.  HPI: Patient seen bedside. Has hx of tracheostomy, answers yes or no questions. States that he bumped his foot and this caused the new injuries to his foot.   Past Medical History:  Diagnosis Date  . Bronchitis 03/2018  . COPD (chronic obstructive pulmonary disease) ([facility])   . Herpes   . laryngeal ca dx'd 06/2018  . Lung nodule 2020  . Substance abuse ([facility])    opiate addiction, been on methadone for 11 years    Past Surgical History:  Procedure Laterality Date  . AMPUTATION TOE Left 06/29/2020   Procedure: AMPUTATION TOE left second toe;  Surgeon: [name], DPM;  Location: [facility];  Service: Podiatry;  Laterality: Left;  . DIRECT LARYNGOSCOPY N/A 07/21/2018   Procedure: DIRECT LARYNGOSCOPY, TRACHEOSTOMY, BIOPSY;  Surgeon: Leta Baptist, MD;  Location: [facility];  Service: ENT;  Laterality: N/A;  . IR GASTROSTOMY TUBE MOD SED  08/02/2018   Family History  Problem Relation Age of Onset  . Alcohol abuse Father   . Cancer Father   . Cirrhosis Father   . Drug abuse Brother     Social History:  reports that he has quit smoking. His smoking use included cigarettes. He has a 100.00 pack-year smoking history. He has never used smokeless tobacco. He reports previous alcohol use. He reports previous drug use.  Allergies: No Known Allergies  Medications: I have reviewed the patient's current medications.  Results for orders placed or performed during the hospital encounter of 07/27/20 (from the past 48 hour(s))  Expectorated Sputum Assessment w Gram Stain, Rflx to Resp Cult     Status: None   Collection Time: 07/28/20  1:57 PM  Result Value Ref Range   Specimen Description EXPECTORATED SPUTUM    Special Requests NONE    Sputum evaluation      THIS SPECIMEN IS ACCEPTABLE FOR SPUTUM CULTURE Performed at [facility], [place] [address]  [place], [place] [address]    Report Status 07/28/2020 FINAL   Culture, Respiratory w Gram Stain     Status: None (Preliminary result)   Collection Time: 07/28/20  1:57 PM  Result Value Ref Range   Specimen Description      EXPECTORATED SPUTUM Performed at [facility], [place] [address], [place], [place] [address]    Special Requests      NONE Reflexed from [id number] Performed at [facility] [address], [place], [place] [address]    Gram Stain      ABUNDANT WBC PRESENT, PREDOMINANTLY PMN ABUNDANT GRAM POSITIVE RODS MODERATE GRAM POSITIVE COCCI    Culture      CULTURE REINCUBATED FOR BETTER GROWTH Performed at [facility], [place] [address], [place], [place] [address]    Report Status PENDING   Rheumatoid factor     Status: Abnormal   Collection Time: 07/28/20  4:33 PM  Result Value Ref Range   Rhuematoid fact SerPl-aCnc 340.0 (H) <14.0 IU/mL    Comment: (NOTE) Results confirmed on dilution. Performed At: [facility] [place], [place] [id number] Rush Farmer MD FT:[id number]   Glomerular basement membrane antibodies     Status: None   Collection Time: 07/28/20  4:33 PM  Result Value Ref Range   GBM Ab 3 0 - 20 units    Comment: (NOTE)  Negative                   0 - 20                   Weak Positive             21 - 30                   Moderate to Strong Positive   >30 Performed At: [facility] [address] [place], [place] [id number] Rush Farmer MD VP:[id number]   Procalcitonin     Status: None   Collection Time: 07/29/20  5:25 AM  Result Value Ref Range   Procalcitonin 0.10 ng/mL    Comment:        Interpretation: PCT (Procalcitonin) <= 0.5 ng/mL: Systemic infection (sepsis) is not likely. Local bacterial infection is possible. (NOTE)       Sepsis PCT Algorithm           Lower Respiratory Tract                                      Infection PCT Algorithm     ----------------------------     ----------------------------         PCT < 0.25 ng/mL                PCT < 0.10 ng/mL          Strongly encourage             Strongly discourage   discontinuation of antibiotics    initiation of antibiotics    ----------------------------     -----------------------------       PCT 0.25 - 0.50 ng/mL            PCT 0.10 - 0.25 ng/mL               OR       >80% decrease in PCT            Discourage initiation of                                            antibiotics      Encourage discontinuation           of antibiotics    ----------------------------     -----------------------------         PCT >= 0.50 ng/mL              PCT 0.26 - 0.50 ng/mL               AND        <80% decrease in PCT             Encourage initiation of                                             antibiotics       Encourage continuation           of antibiotics    ----------------------------     -----------------------------        PCT >= 0.50 ng/mL  PCT > 0.50 ng/mL               AND         increase in PCT                  Strongly encourage                                      initiation of antibiotics    Strongly encourage escalation           of antibiotics                                     -----------------------------                                           PCT <= 0.25 ng/mL                                                 OR                                        > 80% decrease in PCT                                      Discontinue / Do not initiate                                             antibiotics  Performed at [facility] [address], [place], [place] [address]   CBC with Differential/Platelet     Status: Abnormal   Collection Time: 07/29/20  5:25 AM  Result Value Ref Range   WBC 12.4 (H) 4.0 - 10.5 K/uL   RBC 4.13 (L) 4.22 - 5.81 MIL/uL   Hemoglobin 11.3 (L) 13.0 - 17.0 g/dL   HCT 37.9 (L) 39.0 - 52.0 %    MCV 91.8 80.0 - 100.0 fL   MCH 27.4 26.0 - 34.0 pg   MCHC 29.8 (L) 30.0 - 36.0 g/dL   RDW 19.4 (H) 11.5 - 15.5 %   Platelets 262 150 - 400 K/uL   nRBC 0.0 0.0 - 0.2 %   Neutrophils Relative % 89 %   Neutro Abs 11.2 (H) 1.7 - 7.7 K/uL   Lymphocytes Relative 6 %   Lymphs Abs 0.7 0.7 - 4.0 K/uL   Monocytes Relative 4 %   Monocytes Absolute 0.5 0.1 - 1.0 K/uL   Eosinophils Relative 0 %   Eosinophils Absolute 0.0 0.0 - 0.5 K/uL   Basophils Relative 0 %   Basophils Absolute 0.0 0.0 - 0.1 K/uL   Immature Granulocytes 1 %   Abs Immature Granulocytes 0.06 0.00 - 0.07 K/uL    Comment: Performed at [facility], [place]  [address] [place], [place] [address]  Renal function panel     Status: Abnormal   Collection Time: 07/29/20  5:25 AM  Result Value Ref Range   Sodium 143 135 - 145 mmol/L   Potassium 4.0 3.5 - 5.1 mmol/L   Chloride 103 98 - 111 mmol/L   CO2 24 22 - 32 mmol/L   Glucose, Bld 43 (LL) 70 - 99 mg/dL    Comment: REPEATED TO VERIFY CRITICAL RESULT CALLED TO, READ BACK BY AND VERIFIED WITH: WARREN,B. RN AT 8250 07/29/20 MULLINS,T    BUN 25 (H) 8 - 23 mg/dL   Creatinine, Ser 0.81 0.61 - 1.24 mg/dL   Calcium 9.2 8.9 - 10.3 mg/dL   Phosphorus 4.7 (H) 2.5 - 4.6 mg/dL   Albumin 3.1 (L) 3.5 - 5.0 g/dL   GFR, Estimated >60 >60 mL/min    Comment: (NOTE) Calculated using the CKD-EPI Creatinine Equation (2021)    Anion gap 16 (H) 5 - 15    Comment: Performed at [facility], [place] [address] [place], [place] [address]  Magnesium     Status: None   Collection Time: 07/29/20  5:25 AM  Result Value Ref Range   Magnesium 2.3 1.7 - 2.4 mg/dL    Comment: Performed at [facility], [place] [address] [place],  [address]  Glucose, capillary     Status: None   Collection Time: 07/29/20  7:35 AM  Result Value Ref Range   Glucose-Capillary 86 70 - 99 mg/dL    Comment: Glucose reference range applies only to samples taken after  fasting for at least 8 hours.   Comment 1 Notify RN    Comment 2 Document in Chart     No results found.  ROS Blood pressure 123/64, pulse 70, temperature 98.4 F (36.9 C), temperature source Oral, resp. rate 20, height 6' (1.829 m), weight 49.2 kg, SpO2 98 %.  Vitals:   07/30/20 0502 07/30/20 0800  BP: 123/64   Pulse: 70   Resp: 20   Temp: 98.4 F (36.9 C)   SpO2: 99% 98%    General AA&O x3. Normal mood and affect.  Vascular Foot warm to touch. Hx 2nd toe amputation  Neurologic Epicritic sensation grossly present.  Dermatologic (Wound) Wound Location: left first metatarsal, lateral foot Wound Base: Necrotic eschar Peri-wound: Normal Exudate: None: wound tissue dry  2nd toe amputation site healing well, no warmth, erythema, signs of infection.  Orthopedic: Motor intact BLE.    Assessment/Plan:  Left foot s/p 2nd toe amputation; new medial and lateral wounds s/p injury. -Imaging: Studies independently reviewed. No signs of OM. -Antibiotics: none needed for this issue. -WB Status: WBAT in surgical shoe -Wound Care: Betadine WTD - change qOD for approx 1 week. Then will reassess. -Surgical Plan: none at this time. Await demarcation or healing. May benefit from debridement outpatient. If patient remains hospitalized for extended time can do this inpatient. Will f/u in several days to re-eval progress.  Evelina Bucy 07/30/2020, 11:00 AM   Best available via secure chat for questions or concerns.

## 2020-07-30 NOTE — Progress Notes (Addendum)
PROGRESS NOTE    Ronald Ruiz  WER:[id number] DOB: Oct 05, 1955 DOA: 07/27/2020 PCP: [name], MD    Chief Complaint  Patient presents with  . 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 CT angio chest done on admission negative for PE, waxing and waning of multifocal areas of consolidation seen on prior study, significant progression of basilar predominant airspace disease since prior exam, findings favor progressive infection or aspiration pneumonia. Metastatic disease difficult to exclude. Tracheostomy with significant mucoid material throughout the mainstem bronchi and lower lobe airways. Patient placed empirically on IV antibiotics. PCCM consultation and podiatry consultation following   Assessment & Plan:   Active Problems:   Malignant neoplasm of glottis (HCC)   Protein-calorie malnutrition, severe   Dysphagia   Dehydration   Chronic pulmonary aspiration   History of throat cancer   Acute respiratory failure with hypoxia (HCC)   Pressure injury of skin   COPD exacerbation (HCC)   Aspiration pneumonia of both lower lobes due to gastric secretions (HCC)  Acute on chronic respiratory failure with hypoxia likely secondary to recurrent aspiration pneumonia - Presented back with acute on chronic respiratory failure with hypoxia felt likely secondary to recurrent aspiration pneumonia as noted by findings on CT chest. No  PE noted. 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 Patient noted to have increased O2 requirements from baseline on admission. -Discussed with nutrition and dietary on decreasing patient's PEG tube feeding rates given concern for patient to handle high volume tube feeds may likely exacerbate and increased risk of future aspirations -Vancomycin and Zosyn previously discontinued, Unasyn discontinued - transitioned to Augmentin via PEG tube for DC plannning  Severe protein calorie malnutrition Patient frail, cachectic, emaciated, temporal wasting.  Tube feeds adjusted as above per dietary, concern for elevated feed rate and aspiration events.  COPD without acute exacerbation - Continue Pulmicort. Placed on Brovana, scheduled DuoNebs, Claritin, PPI, Robitussin, Flonase. -Acute hypoxia as above likely in the setting of aspiration due to tube feeds  Acute on chronic aspiration Resume tube feeds per dietary, bolus during the day with continuous feeds at night, education on the patient's part for remaining upright and avoiding any reclined or supine position which would increase risk of aspiration.  History of throat cancer/malignant neoplasm of the glottitis Oncology informed of admission per admitting physician. Palliative care consultation pending for goals of care.  Chronic pain syndrome Continue home regimen methadone.  Asymptomatic bradycardia Check a EKG. Follow.  Left foot wound/cellulitis - Status post recent amputation of left second toe per podiatry w/ Dr. Posey Pronto. Plain films of left foot obtained which are consistent with a cellulitis.  - Podiatry team evaluating, recommending weightbearing as tolerated in surgical shoe, Betadine and every other day dressing changes for 1 week with close follow-up outpatient.  Pressure injury, POA Pressure Injury 06/23/20 Coccyx Stage 3 -  Full thickness tissue loss.  Subcutaneous fat may be visible but bone, tendon or muscle are NOT exposed. Quarter sized open area on coccyx, pink with yellow slough,  previously documented as an open wound (documen (Active)  06/23/20 (wound was assessed on admission but documented under wound, not pressure) 0339 (unknown exact time of first assessment)  Location: Coccyx  Location Orientation:   Staging: Stage 3 -  Full thickness tissue loss. Subcutaneous fat may be visible but bone, tendon or muscle are NOT exposed.  Wound Description (Comments): Quarter sized open area on coccyx, pink with yellow slough, previously documented as an open wound (documenting as a pressure injury due to location on bony prominence.  Present on Admission: Yes    DVT prophylaxis: Lovenox Code Status: Full Family Communication: Updated daughter at bedside. Disposition:   Status is: Inpatient Dispo: The patient is from: Home.              Anticipated d/c is to: Home with home health per patient wishes              Patient currently with acute on chronic respiratory failure secondary to recurrent aspiration pneumonia, consultations pending, not stable for discharge.   Consultants:   PCCM   Podiatry  Wound care RN  Procedures:   CT angiogram chest 07/27/2020  Chest x-ray 07/27/2020  Plain films of the left foot 07/28/2020  Antimicrobials:   IV vancomycin 07/27/2020>>>> 07/28/2020  IV Zosyn 07/28/2020>>>> 07/28/2020 x 1 dose  IV Unasyn 07/28/2020 >>> Augmentin 07/29/2020  IV cefepime 3/7/2022x1 dose   Subjective: No acute issues or events overnight denies nausea vomiting diarrhea constipation headache fevers or chills.  Objective: Vitals:   07/29/20 2005 07/29/20 2035 07/30/20 0502 07/30/20 0800  BP:  (!) 95/49 123/64   Pulse:  80 70   Resp:  16 20   Temp:  98.2 F (36.8 C) 98.4 F (36.9 C)   TempSrc:  Oral Oral   SpO2: 95% 95% 99% 98%  Weight:      Height:        Intake/Output Summary (Last 24 hours) at 07/30/2020 0813 Last  data filed at 07/30/2020 0600 Gross per 24 hour  Intake 1904.69 ml  Output 1350 ml  Net 554.69 ml   Filed Weights   07/27/20 2222 07/28/20 1429  Weight: 49.2 kg 49.2 kg    Examination:  General exam: Frail. Cachectic. Emaciated. Temporal wasting. Respiratory system: Coarse rhonchorous breath sounds anterior lung fields. No wheezing. No crackles. Trach stoma midline without erythema or purulence. Cardiovascular system: Bradycardia. No JVD, murmurs, rubs, gallops or clicks. No pedal edema. Gastrointestinal system: Abdomen is nondistended, soft and nontender. No organomegaly or masses felt. Normal bowel sounds heard. PEG tube intact. Central nervous system: Alert and oriented. No focal neurological deficits. Extremities: 1+ bilateral pedal edema. Left foot wrapped in bandage. Skin: No rashes, lesions or ulcers Psychiatry: Judgement and insight appear normal. Mood & affect appropriate.     Data Reviewed: I have personally reviewed following labs and imaging studies  CBC: Recent Labs  Lab 07/27/20 1635 07/28/20 0059 07/29/20 0525  WBC 19.1* 13.2* 12.4*  NEUTROABS 17.5* 11.8* 11.2*  HGB 10.8* 9.9* 11.3*  HCT 36.2* 31.9* 37.9*  MCV 91.6 90.6 91.8  PLT 293 258 333    Basic Metabolic Panel: Recent Labs  Lab 07/27/20 1635 07/28/20 0059 07/29/20 0525  NA 138 137 143  K 4.4 4.1 4.0  CL 98 101 103  CO2 32 31 24  GLUCOSE 83 75 43*  BUN 28* 22 25*  CREATININE 0.88 0.73 0.81  CALCIUM 9.1 8.7* 9.2  MG  --  2.2 2.3  PHOS  --  3.3 4.7*    GFR: Estimated Creatinine Clearance: 64.1 mL/min (by C-G formula based on SCr of 0.81 mg/dL).  Liver Function Tests: Recent Labs  Lab 07/27/20 1635 07/28/20 0059 07/29/20 0525  AST 17 18  --   ALT 12 13  --   ALKPHOS 84 72  --   BILITOT 0.4 0.6  --   PROT 7.4 6.4*  --   ALBUMIN 2.6* 2.0* 3.1*    CBG: Recent Labs  Lab 07/29/20 0735  GLUCAP 86     Recent Results (from the past 240 hour(s))  Resp Panel by RT-PCR (Flu A&B,  Covid) Nasopharyngeal Swab     Status: None   Collection Time: 07/27/20  7:00 PM   Specimen: Nasopharyngeal Swab; Nasopharyngeal(NP) swabs in vial transport medium  Result Value Ref Range Status   SARS Coronavirus 2 by RT PCR NEGATIVE NEGATIVE Final    Comment: (NOTE) SARS-CoV-2 target nucleic acids are NOT DETECTED.  The SARS-CoV-2 RNA is generally detectable in upper respiratory specimens during the acute phase of infection. The lowest concentration of SARS-CoV-2 viral copies this assay can detect is 138 copies/mL. A negative result does not preclude SARS-Cov-2 infection and should not be used as the sole basis for treatment or other patient management decisions. A negative result may occur with  improper specimen collection/handling, submission of specimen other than nasopharyngeal swab, presence of viral mutation(s) within the areas targeted by this assay, and inadequate number of viral copies(<138 copies/mL). A negative result must be combined with clinical observations, patient history, and epidemiological information. The expected result is Negative.  Fact Sheet for Patients:  EntrepreneurPulse.com.au  Fact Sheet for Healthcare Providers:  IncredibleEmployment.be  This test is no t yet approved or cleared by the [place] FDA and  has been authorized for detection and/or diagnosis of SARS-CoV-2 by FDA under an Emergency Use Authorization (EUA). This EUA will remain  in effect (meaning this test can be used) for the duration of the COVID-19 declaration under Section 564(b)(1) of the Act, 21 U.S.C.section 360bbb-3(b)(1), unless the authorization is terminated  or revoked sooner.       Influenza A by PCR NEGATIVE NEGATIVE Final   Influenza B by PCR NEGATIVE NEGATIVE Final    Comment: (NOTE) The Xpert Xpress SARS-CoV-2/FLU/RSV plus assay is intended as an aid in the diagnosis of influenza from Nasopharyngeal swab specimens and should  not be used as a sole basis for treatment. Nasal washings and aspirates are unacceptable for Xpert Xpress SARS-CoV-2/FLU/RSV testing.  Fact Sheet for Patients: EntrepreneurPulse.com.au  Fact Sheet for Healthcare Providers: IncredibleEmployment.be  This test is not yet approved or cleared by the [place] FDA and has been authorized for detection and/or diagnosis of SARS-CoV-2 by FDA under an Emergency Use Authorization (EUA). This EUA will remain in effect (meaning this test can be used) for the duration of the COVID-19 declaration under Section 564(b)(1) of the Act, 21 U.S.C. section 360bbb-3(b)(1), unless the authorization is terminated or revoked.  Performed at [facility], [place] [address], [place], [place] [address]   Culture, blood (routine x 2) Call MD if unable to obtain prior to antibiotics being given     Status: None (Preliminary result)   Collection Time: 07/28/20 12:59 AM   Specimen: BLOOD  Result Value Ref Range Status   Specimen Description   Final    BLOOD BLOOD RIGHT ARM Performed at [facility] [address], [place], [place] [address]    Special Requests  Final    BOTTLES DRAWN AEROBIC ONLY Blood Culture adequate volume Performed at [facility] [address], [place], [place] [address]    Culture   Final    NO GROWTH 1 DAY Performed at [facility], [place] [address] [place], [place] [address]    Report Status PENDING  Incomplete  Culture, blood (routine x 2) Call MD if unable to obtain prior to antibiotics being given     Status: None (Preliminary result)   Collection Time: 07/28/20 12:59 AM   Specimen: BLOOD  Result Value Ref Range Status   Specimen Description   Final    BLOOD BLOOD RIGHT WRIST Performed at [facility] [address], [place], [place] [address]    Special Requests   Final    BOTTLES DRAWN AEROBIC ONLY Blood  Culture adequate volume Performed at [facility] [address], [place], [place] [address]    Culture   Final    NO GROWTH 1 DAY Performed at [facility], [place] [address], [place], [place] [address]    Report Status PENDING  Incomplete  MRSA PCR Screening     Status: None   Collection Time: 07/28/20  2:00 AM   Specimen: Nasal Mucosa; Nasopharyngeal  Result Value Ref Range Status   MRSA by PCR NEGATIVE NEGATIVE Final    Comment:        The GeneXpert MRSA Assay (FDA approved for NASAL specimens only), is one component of a comprehensive MRSA colonization surveillance program. It is not intended to diagnose MRSA infection nor to guide or monitor treatment for MRSA infections. Performed at [facility], [place] [address], [place], [place] [address]   Expectorated Sputum Assessment w Gram Stain, Rflx to Resp Cult     Status: None   Collection Time: 07/28/20  1:57 PM  Result Value Ref Range Status   Specimen Description EXPECTORATED SPUTUM  Final   Special Requests NONE  Final   Sputum evaluation   Final    THIS SPECIMEN IS ACCEPTABLE FOR SPUTUM CULTURE Performed at [facility], [place] [address], [place], [place] [address]    Report Status 07/28/2020 FINAL  Final  Culture, Respiratory w Gram Stain     Status: None (Preliminary result)   Collection Time: 07/28/20  1:57 PM  Result Value Ref Range Status   Specimen Description   Final    EXPECTORATED SPUTUM Performed at [facility] [address], [place], [place] [address]    Special Requests   Final    NONE Reflexed from [id number] Performed at [facility] [address], [place], [place] [address]    Gram Stain   Final    ABUNDANT WBC PRESENT, PREDOMINANTLY PMN ABUNDANT GRAM POSITIVE RODS MODERATE GRAM POSITIVE COCCI    Culture   Final    TOO YOUNG TO READ Performed at [facility], [place] [address] [place],   [address]    Report Status PENDING  Incomplete         Radiology Studies: No results found.      Scheduled Meds: . amoxicillin-clavulanate  800 mg Per Tube Q12H  . arformoterol  15 mcg Nebulization BID  . budesonide  0.5 mg Nebulization BID  . chlorhexidine  15 mL Mouth Rinse BID  . enoxaparin (LOVENOX) injection  40 mg Subcutaneous Q24H  . feeding supplement (KATE [employer]ARMS STANDARD 1.4)  325 mL Per Tube TID  BM  . fluticasone  2 spray Each Nare Daily  . guaiFENesin  10 mL Per Tube BID  . ipratropium-albuterol  3 mL Nebulization Q6H  . loratadine  10 mg Per Tube Daily  . mouth rinse  15 mL Mouth Rinse q12n4p  . methadone  120 mg Oral Daily  . pantoprazole sodium  40 mg Per Tube Daily  . thiamine  100 mg Per Tube Daily   Continuous Infusions: . feeding supplement (KATE FARMS STANDARD 1.4) 1,600 mL (07/29/20 1755)     LOS: 3 days    Time spent: 40 minutes  Little Ishikawa, DO [facility]  Pager: Secure chat  07/30/2020, 8:13 AM

## 2020-07-30 NOTE — Progress Notes (Signed)
Occupational Therapy Treatment Patient Details Name: Ronald Ruiz MRN: [id number] DOB: 02/22/1956 Today's Date: 07/30/2020    History of present illness 65 year old gentleman history of COPD, laryngeal cancer status post radiation resection with tracheostomy since decannulated, oropharyngeal dysphagia with chronic pulmonary aspiration currently PEG dependent, substance use disorder on chronic methadone presenting on 07/27/2020 for worsening shortness of breath and O2 desaturation no higher than 88% when pt increased his home O2 from 2 to 3L. EMS increased to 5L to allow pt to reach mid 90s.   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   OT comments  Treatment focused on patient performing self care tasks and education in regards to going home. Patient demonstrated ability to don sock and reports he donned post op shoe himself and stood at sink for grooming. Patient predominantly set up and min guard for ADLs and mobility. Patient ambulated in room without device. Patient reports having RW at home. Reports he performs sponge baths at home and doesn't get into shower. Patient exhibiting functional abilities to go home though continues to have decreased activity tolerance.    Follow Up Recommendations  No OT follow up    Equipment Recommendations  None recommended by OT    Recommendations for Other Services      Precautions / Restrictions Precautions Precautions: Fall Precaution Comments: 2 lts nasal baseline, PEG NPO, recent L foot 2nd toe amp surgery Required Braces or Orthoses: Other Brace Other Brace: post op shoe for left second toe amputation Restrictions Weight Bearing Restrictions: No LLE Weight Bearing: Weight bearing as tolerated Other Position/Activity Restrictions: in postop shoe       Mobility Bed Mobility Overal bed mobility: Modified Independent             General bed mobility comments:  OOB in recliner    Transfers Overall transfer level: Needs assistance Equipment used: None Transfers: Sit to/from Stand Sit to Stand: Supervision Stand pivot transfers: Supervision       General transfer comment: supervision to ambulate to bathroom and perform standing grooming task. No overt loss of balance. Therapist assisted with oxygen tank.    Balance Overall balance assessment: Mild deficits observed, not formally tested                                         ADL either performed or assessed with clinical judgement   ADL   Eating/Feeding: NPO Eating/Feeding Details (indicate cue type and reason): PEG feeds only. Grooming: Standing;Wash/dry face;Brushing hair Grooming Details (indicate cue type and reason): Patient stood at sink x 2 minutes to perform grooming task.             Lower Body Dressing: Sit to/from stand Lower Body Dressing Details (indicate cue type and reason): Patient able to don sock for therapist and reports he donned post up shoe himself.                     Vision Patient Visual Report: No change from baseline     Perception     Praxis      Cognition Arousal/Alertness: Awake/alert Behavior During Therapy: WFL for tasks assessed/performed Overall Cognitive Status: Within Functional Limits for tasks assessed  General Comments: AxO x 3 very motivated, easy going, difficult speech s/p trach now with 4x4        Exercises     Shoulder Instructions       General Comments      Pertinent Vitals/ Pain       Pain Assessment: No/denies pain  Home Living                                          Prior Functioning/Environment              Frequency  Min 2X/week        Progress Toward Goals  OT Goals(current goals can now be found in the care plan section)  Progress towards OT goals: Progressing toward goals  Acute Rehab OT  Goals Patient Stated Goal: to go home OT Goal Formulation: With patient Time For Goal Achievement: 08/11/20 Potential to Achieve Goals: Good  Plan Discharge plan needs to be updated    Co-evaluation          OT goals addressed during session: ADL's and self-care      AM-PAC OT "6 Clicks" Daily Activity     Outcome Measure   Help from another person eating meals?: Total (NPO) Help from another person taking care of personal grooming?: None Help from another person toileting, which includes using toliet, bedpan, or urinal?: A Little Help from another person bathing (including washing, rinsing, drying)?: A Little Help from another person to put on and taking off regular upper body clothing?: A Little Help from another person to put on and taking off regular lower body clothing?: A Little 6 Click Score: 17    End of Session Equipment Utilized During Treatment: Oxygen  OT Visit Diagnosis: Unsteadiness on feet (R26.81);Muscle weakness (generalized) (M62.81);Pain   Activity Tolerance Patient tolerated treatment well   Patient Left in chair;with call bell/phone within reach;with family/visitor present   Nurse Communication Mobility status        Time: 5747-3403 OT Time Calculation (min): 15 min  Charges: OT General Charges $OT Visit: 1 Visit OT Treatments $Self Care/Home Management : 8-22 mins  Derl Barrow, OTR/L [place]  Office [phone] Pager: [place] 07/30/2020, 4:20 PM

## 2020-07-30 NOTE — Progress Notes (Signed)
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 He has had issues with ongoing heavy smoking, COPD/emphysema recurrent aspiration pneumonias. He has been following up with Dr. Benjamine Mola for ENT management with Dr. Vaughan Browner from a pulmonary standpoint to address his recurrent aspiration pneumonias. He has had recurrent hospitalizations for pneumonia and we were consulted for goals of care discussion.  We had an honest discussion regarding his clinical course, ongoing weight loss and issues with swallowing and recurrent aspiration.  He uses a suction catheter to clear his oral secretions.  He notes that he is getting a feeding pump to switch from bolus feeding to continuous tube feeding to reduce gastric residuals and risk of aspiration. He notes he is never noted tube feeding residue coming out to his tracheostomy opening.  He notes that he is feeling a little better today with antibiotics. Pulmonary is also following for evaluation and management of his acute on chronic respiratory failure.  OBJECTIVE:  Mild distress  PHYSICAL EXAMINATION: . Vitals:   07/29/20 2035 07/30/20 0502 07/30/20 0800 07/30/20 1311  BP: (!) 95/49 123/64  114/66  Pulse: 80 70  73  Resp: 16 20  16   Temp: 98.2 F (36.8 C) 98.4 F (36.9 C)    TempSrc: Oral Oral    SpO2: 95% 99% 98% 100%  Weight:      Height:       Filed Weights   07/27/20 2222 07/28/20 1429  Weight: 108 lb 7.5 oz (49.2 kg) 108 lb 7.5 oz (49.2 kg)   .Body mass index is 14.71 kg/m.  GENERAL:alert, mild distress appears cachectic  SKIN: No acute rashes. NECK: tracheostomy opening. LYMPH:  no palpable lymphadenopathy in the cervical, axillary or  inguinal LUNGS: Bilateral decreased air entry with coarse breath sounds. HEART: S1-S2 regular  ABDOMEN: abdomen soft, non-tender, G tube in situ  MEDICAL HISTORY:  Past Medical History:  Diagnosis Date  . Bronchitis 03/2018  . COPD (chronic obstructive pulmonary disease) ([place])   . Herpes   . laryngeal ca dx'd 06/2018  . Lung nodule 2020  . Substance abuse ([place])    opiate addiction, been on methadone for 11 years    SURGICAL HISTORY: Past Surgical History:  Procedure Laterality Date  . AMPUTATION TOE Left 06/29/2020   Procedure: AMPUTATION TOE left second toe;  Surgeon: [name], DPM;  Location: [facility];  Service: Podiatry;  Laterality: Left;  . DIRECT LARYNGOSCOPY N/A 07/21/2018   Procedure: DIRECT LARYNGOSCOPY, TRACHEOSTOMY, BIOPSY;  Surgeon: Leta Baptist, MD;  Location: [facility];  Service: ENT;  Laterality: N/A;  . IR GASTROSTOMY TUBE MOD SED  08/02/2018    SOCIAL HISTORY: Social History   Socioeconomic History  . Marital status: Widowed    Spouse name: Not on file  . Number of children: Not on file  . Years of education: Not on file  . Highest education level: Not on file  Occupational History  . Not on file  Tobacco Use  . Smoking status: Former Smoker    Packs/day: 2.00    Years: 50.00    Pack years: 100.00    Types: Cigarettes  . Smokeless tobacco: Never Used  . Tobacco comment: 07/06/18 at 10 cigs per day, quit 06/16/18  Vaping Use  . Vaping Use: Former  . Start date: 05/23/2017  . Substances: Nicotine  Substance and Sexual Activity  . Alcohol use: Not Currently  . Drug use: Not Currently    Comment: hx of opiod abuse  . Sexual activity: Not Currently    Birth control/protection: None  Other Topics Concern  . Not on file  Social History Narrative   Right Handed   Lives in a two story home   Social Determinants of Health   Financial Resource Strain: Not on file  Food Insecurity: Not on file  Transportation Needs: Not on file  Physical Activity: Not on  file  Stress: Not on file  Social Connections: Not on file  Intimate Partner Violence: Not on file    FAMILY HISTORY: Family History  Problem Relation Age of Onset  . Alcohol abuse Father   . Cancer Father   . Cirrhosis Father   . Drug abuse Brother     ALLERGIES:  has No Known Allergies.  MEDICATIONS:  Scheduled Meds: . amoxicillin-clavulanate  800 mg Per Tube Q12H  . arformoterol  15 mcg Nebulization BID  . budesonide  0.5 mg Nebulization BID  . chlorhexidine  15 mL Mouth Rinse BID  . enoxaparin (LOVENOX) injection  40 mg Subcutaneous Q24H  . feeding supplement (KATE FARMS STANDARD 1.4)  325 mL Per Tube TID BM  . fluticasone  2 spray Each Nare Daily  . guaiFENesin  10 mL Per Tube BID  . ipratropium-albuterol  3 mL Nebulization Q6H  . loratadine  10 mg Per Tube Daily  . mouth rinse  15 mL Mouth Rinse q12n4p  . methadone  120 mg Oral Daily  . pantoprazole sodium  40 mg Per Tube Daily  . thiamine  100 mg Per Tube Daily   Continuous Infusions: . feeding supplement (KATE FARMS STANDARD 1.4) 1,600 mL (07/29/20 1755)   PRN Meds:.acetaminophen **OR** acetaminophen, albuterol, bisacodyl, ondansetron **OR** ondansetron (ZOFRAN) IV  REVIEW OF SYSTEMS:    10 Point review of Systems was done is negative except as noted above.   LABORATORY DATA:  I have reviewed the data as listed  . CBC Latest Ref Rng & Units 07/29/2020 07/28/2020 07/27/2020  WBC 4.0 - 10.5 K/uL 12.4(H) 13.2(H) 19.1(H)  Hemoglobin 13.0 - 17.0 g/dL 11.3(L) 9.9(L) 10.8(L)  Hematocrit 39.0 - 52.0 % 37.9(L) 31.9(L) 36.2(L)  Platelets 150 - 400 K/uL 262 258 293    . CMP Latest Ref Rng & Units 07/29/2020 07/28/2020 07/27/2020  Glucose 70 - 99 mg/dL 43(LL) 75 83  BUN 8 - 23 mg/dL 25(H) 22 28(H)  Creatinine 0.61 - 1.24 mg/dL 0.81 0.73 0.88  Sodium 135 - 145 mmol/L 143 137 138  Potassium 3.5 - 5.1 mmol/L 4.0 4.1 4.4  Chloride 98 - 111 mmol/L 103 101 98  CO2 22 - 32 mmol/L 24 31 32  Calcium 8.9 - 10.3 mg/dL 9.2 8.7(L)  9.1  Total Protein 6.5 - 8.1 g/dL - 6.4(L) 7.4  Total Bilirubin 0.3 - 1.2 mg/dL - 0.6 0.4  Alkaline Phos 38 - 126 U/L - 72 84  AST 15 - 41 U/L - 18 17  ALT 0 - 44 U/L - 13 12     RADIOGRAPHIC STUDIES: I have personally reviewed the radiological images as listed and agreed with the findings in the report. CT Angio Chest PE W and/or Wo Contrast  Result Date: 07/27/2020 CLINICAL DATA:  Short of breath since this morning, productive drainage from tracheostomy EXAM: CT ANGIOGRAPHY  CHEST WITH CONTRAST TECHNIQUE: Multidetector CT imaging of the chest was performed using the standard protocol during bolus administration of intravenous contrast. Multiplanar CT image reconstructions and MIPs were obtained to evaluate the vascular anatomy. CONTRAST:  133mL OMNIPAQUE IOHEXOL 350 MG/ML SOLN COMPARISON:  07/27/2020, 03/03/2020 FINDINGS: Cardiovascular: This is a technically adequate evaluation of the pulmonary vasculature. No filling defects or pulmonary emboli. The heart is unremarkable without pericardial effusion. Extensive atherosclerosis of the coronary vasculature. The thoracic aorta is unremarkable without aneurysm or dissection. Mild atherosclerosis of the aortic arch. Mediastinum/Nodes: No pathologic adenopathy. The thyroid and esophagus are grossly normal. Tracheostomy is identified. There is mucoid material within the mainstem bronchi bilaterally. Lungs/Pleura: Extensive background emphysema is again identified. There is waxing and waning appearance of the multiple nodular areas of consolidation seen previously. In area of dense consolidation within the lateral basilar segment of the right lower lobe on prior study measuring 8.8 x 4.0 cm has near completely resolved, with small residual cavitary areas remaining, measuring 2.1 x 1.1 cm image 94/6. The small nodule with central cavitation in the right lower lobe on prior study image 122/3 has increased in size now measuring 2.4 x 1.8 cm. New dense areas of  consolidation are seen within the left lower lobe and lingula. There is progressive bilateral bronchiectasis. Overall, the appearance suggests progressive infection, including aspiration or atypical organisms such as fungal pneumonia. Given history of head and neck cancer, metastatic disease would be difficult to exclude, and close follow-up is warranted. There is no effusion or pneumothorax. Upper Abdomen: Percutaneous gastrostomy tube is identified. No acute upper abdominal findings. Musculoskeletal: The patient is markedly cachectic. There are no acute or destructive bony lesions. Reconstructed images demonstrate no additional findings. Review of the MIP images confirms the above findings. IMPRESSION: 1. No evidence of pulmonary embolus. 2. Waxing and waning of multifocal areas of consolidation seen on prior study. Overall, there has been significant progression of the basilar predominant airspace disease since prior exam. The findings favor progressive infection or aspiration pneumonia. Metastatic disease would be difficult to exclude, and close imaging follow-up is recommended. 3. Tracheostomy, with significant mucoid material throughout the mainstem bronchi and lower lobe airways. 4. Aortic Atherosclerosis (ICD10-I70.0). Coronary artery atherosclerosis. Electronically Signed   By: Randa Ngo M.D.   On: 07/27/2020 21:51   DG Chest Portable 1 View  Result Date: 07/27/2020 CLINICAL DATA:  Shortness of breath. EXAM: PORTABLE CHEST 1 VIEW COMPARISON:  Most recent radiograph 06/23/2020.  Chest CT 03/03/2020 FINDINGS: Emphysema with chronic hyperinflation and bronchial thickening. Persistent patchy bilateral airspace opacities. New patchy airspace disease at the left lung base. There is biapical pleuroparenchymal scarring. Heart is normal in size. Blunting of the costophrenic angles was seen previously and likely related to hyperinflation. No large effusion. No pneumothorax. No pulmonary edema. IMPRESSION: 1.  Persistent heterogeneous bilateral lung opacities since radiographs last month, with new left basilar airspace disease. Differential considerations include pneumonia (including aspiration) versus malignancy. 2. Emphysema with chronic hyperinflation. Electronically Signed   By: Keith Rake M.D.   On: 07/27/2020 17:32   DG Foot 2 Views Left  Result Date: 07/28/2020 CLINICAL DATA:  Left foot wound EXAM: LEFT FOOT - 2 VIEW COMPARISON:  06/29/2020 FINDINGS: Two view radiograph left foot demonstrates surgical changes of second digit amputation distal to the second metatarsophalangeal joint. Moderate first ray bunion deformity is seen with moderate degenerative arthritis of the first metatarsophalangeal joint. No acute fracture or dislocation. No destructive osseous lesion. There is moderate soft  tissue swelling of the left forefoot diffusely. Moderate bimalleolar soft tissue swelling is incidentally noted. IMPRESSION: Status post second digit amputation. Diffuse soft tissue swelling. No focal destructive osseous lesion identified to suggest focal osteomyelitis. 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  S/p 6 weekly doses of Carboplatin and Taxol with concurrent RT, completed 10/16/18. He received IMRT total dose of 70 Gray in 35 fractions.  2) recurrent aspiration pneumonias in the context of having tracheostomy and neck radiation.  3) acute on chronic respiratory failure due to aspiration pneumonia with significant baseline COPD/emphysema.  PLAN -Patient has had variable fluctuating lung nodules that have been consistent with episodes of recurrent pneumonia/aspiration events.  Patient has been following with pulmonary Dr. Vaughan Browner as outpatient to monitor these and evaluate if indicated for pulmonary  metastases. -Patient continues to follow-up with Dr. Benjamine Mola for local head and neck evaluation for local recurrence and has not been a new concern for this currently. -Patient might eventually benefit from a repeat PET CT scan after pneumonia has been completely cleared.  No indication for biopsy of any lung lesions at this time. -Pulmonary critical care following as inpatient. -I discussed his clinical status and clinical course in details.  He is aware that his clinical baseline has worsened with weight loss recurrent pneumonias and recurrent hospitalizations.  He is aware that even if he is cancer free his pulmonary status with recurrent exacerbations might qualify him for a best supportive care approach through hospice if he so chooses. -From a pulmonary standpoint will defer to pulmonary critical care to define goals of care. -We will plan to see as outpatient in 2 months with labs. -Appreciate hospital medicine and pulmonary critical care input. -Please call if any other oncologic questions or issues arise.  I spent 25 minutes counseling the patient face to face. The total time spent in the appointment was 35 minutes and more than 50% was on counseling and direct patient cares.    Sullivan Lone MD [place] AAHIVMS [facility] Hematology/Oncology Physician [facility]  (Office):       [phone] (Work cell):  [phone] (Fax):           [phone]

## 2020-07-30 NOTE — Progress Notes (Signed)
Pt refused CPT at this time.

## 2020-07-30 NOTE — Plan of Care (Signed)

## 2020-07-30 NOTE — Progress Notes (Signed)
Physical Therapy Treatment Patient Details Name: Ronald Ruiz MRN: [id number] DOB: 1955/08/07 Today's Date: 07/30/2020    History of Present Illness 65 year old gentleman history of COPD, laryngeal cancer status post radiation resection with tracheostomy since decannulated, oropharyngeal dysphagia with chronic pulmonary aspiration currently PEG dependent, substance use disorder on chronic methadone presenting on 07/27/2020 for worsening shortness of breath and O2 desaturation no higher than 88% when pt increased his home O2 from 2 to 3L. EMS increased to 5L to allow pt to reach mid 90s.   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    PT Comments    Pt feeling better.  In bed on 2 lts at 99% at rest.  Assisted OOB to amb.  General transfer comment: good safety cognition and use of hands to steady self.  General Gait Details: pt tolerated amb a great distance 220 feet with walker at Supervision/MinGuard Assist with no overt LOB and no true dyspnea.  Avg sats 92% on 2 lts.  Assisted to recliner and positioned to comfort.    Follow Up Recommendations  Home health PT     Equipment Recommendations  None recommended by PT    Recommendations for Other Services       Precautions / Restrictions Precautions Precautions: Fall Precaution Comments: 2 lts nasal baseline, PEG NPO, recent L foot 2nd toe amp surgery Required Braces or Orthoses: Other Brace Other Brace: post op shoe for left second toe amputation Restrictions Weight Bearing Restrictions: No LLE Weight Bearing: Weight bearing as tolerated Other Position/Activity Restrictions: in postop shoe    Mobility  Bed Mobility Overal bed mobility: Modified Independent             General bed mobility comments: self able with increased time    Transfers Overall transfer level: Needs assistance Equipment used: Rolling walker (2 wheeled) Transfers: Sit to/from  [facility] Sit to Stand: Supervision Stand pivot transfers: Supervision       General transfer comment: good safety cognition and use of hands to steady self  Ambulation/Gait Ambulation/Gait assistance: Supervision;Min guard Gait Distance (Feet): 220 Feet Assistive device: Rolling walker (2 wheeled) Gait Pattern/deviations: Step-through pattern;Decreased stride length Gait velocity: decreased   General Gait Details: pt tolerated amb a great distance 220 feet with walker at Supervision/MinGuard Assist with no overt LOB and no true dyspnea.  Avg sats 92% on 2 lts.   Stairs             Wheelchair Mobility    Modified Rankin (Stroke Patients Only)       Balance                                            Cognition Arousal/Alertness: Awake/alert Behavior During Therapy: WFL for tasks assessed/performed Overall Cognitive Status: Within Functional Limits for tasks assessed                                 General Comments: AxO x 3 very motivated, easy going, difficult speech s/p trach now with 4x4      Exercises      General Comments        Pertinent Vitals/Pain Pain Assessment: No/denies pain    Home Living  Prior Function            PT Goals (current goals can now be found in the care plan section) Progress towards PT goals: Progressing toward goals    Frequency    Min 3X/week      PT Plan Current plan remains appropriate    Co-evaluation              AM-PAC PT "6 Clicks" Mobility   Outcome Measure  Help needed turning from your back to your side while in a flat bed without using bedrails?: None Help needed moving from lying on your back to sitting on the side of a flat bed without using bedrails?: None Help needed moving to and from a bed to a chair (including a wheelchair)?: None Help needed standing up from a chair using your arms (e.g., wheelchair or  bedside chair)?: None   Help needed climbing 3-5 steps with a railing? : A Little 6 Click Score: 19    End of Session Equipment Utilized During Treatment: Gait belt Activity Tolerance: Patient tolerated treatment well Patient left: in chair;with call bell/phone within reach Nurse Communication: Mobility status PT Visit Diagnosis: Difficulty in walking, not elsewhere classified (R26.2);Muscle weakness (generalized) (M62.81);Adult, failure to thrive (R62.7)     Time: 1216-1243 PT Time Calculation (min) (ACUTE ONLY): 27 min  Charges:  $Gait Training: 8-22 mins $Therapeutic Activity: 8-22 mins                     Rica Koyanagi  PTA Acute  Rehabilitation Services Pager      [phone] Office      [phone]

## 2020-07-30 NOTE — Progress Notes (Signed)
NAME:  Ronald Ruiz, MRN:  [id number], DOB:  [birth year], LOS: 3 ADMISSION DATE:  07/27/2020, CONSULTATION DATE:  07/28/20 REFERRING MD:  Dr. Grandville Silos, CHIEF COMPLAINT:  SOB  Brief History:  [name] admitted 3/7 with reports of shortness of breath and weakness. Admitted with concern for recurrent aspiration PNA.    History of Present Illness:  [name] who presented to [facility] On 3/7 with reports of shortness of breath. He has a hx of laryngeal cancer s/p trach / radiation (since decannulated but has stoma as did not have enough sub-Q tissue for it to close), chronic hypoxic respiratory failure, PEG dependent for feeding, substance abuse on methadone & chronic aspiration followed by Dr. Vaughan Browner.   The patient was brought in by EMS with reports of SOB that began 3/6 pm.  He was recently admitted from 2/1-2/18 for PNA. That hospitalization was notable for a second left toe comminuted fracture with osteomyelitis s/p amputation by podiatry, sacral ulcer, malnutrition, persistent hypoglycemia and hypoxic resp failure in the setting of klebsiella & providencia PNA.  He was discharged on 2L [place] O2.    The patient reports he was unable to get the [facility] he was discharged on as it was 1200$.  He denies new fevers, chills, cough / sputum production. Notes that he is aware of his aspiration when he does his bolus feedings. He can feel it coming up and then he coughs it out of his trach stoma.  He notes he has slowed his feedings to see if this helps but they are concerned he isn't getting enough calories.  He does not eat or drink by mouth.   He began feeling short of breath and weak on 3/6 evening.  AM of 3/7 he felt SOB & turned it up his O2 prior to EMS arrival to 3L.  On EMS arrival, he was 88% on 3L.  EMS further increased O2 to 5L with sats of 98% on arrival to ER.       Initial work up notable for VBG 7.351 / 60 pCO2, albumin 2, CRP 12.3, PCT 0.21, WBC 13.2, Hgb 9.9, ESR 80 and pre-albumin 6.7.  CXR was  concerning for persistent heterogenous bilateral lung opacities since last month, emphysema and chronic hyperinflation.  CTA of the chest was evaluated on presentation 3/7 which was negative for PE, but showed waxing & waning of multifocal areas of consolidation seen on prior study. Overall, there has been significant progression of the basilar predominant airspace disease since prior study, findings favor progressive infection or aspiration.  Can not exclude metastatic disease.  Mucoid material throughout the mainstem bronchi and lower lobe airways.  He was admitted per [facility] and treated with IV unasyn for PNA coverage.    PCCM consulted for pulmonary evaluation.   Past Medical History:  Substance Abuse - on methadone  Lung Nodule Laryngeal Cancer - followed by Dr. Isidore Moos, Dr. Benjamine Mola Herpes COPD - followed by Dr. Vaughan Browner   [facility] Events:  3/07 Admit  3/08 PCCM consulted for recurrent aspiration   Consults:  Podiatry   Procedures:     Significant Diagnostic Tests:   CTA PE 3/7 >> negative for PE, waxing & waning of multifocal areas of consolidation seen on prior study. Overall, there has been significant progression of the basilar predominant airspace disease since prior study, findings favor progressive infection or aspiration.  Can not exclude metastatic disease.  Mucoid material throughout the mainstem bronchi and lower lobe airways.   Micro Data:  COVID 3/7 >> negative  Influenza A/B 3/7 >> negative  U. Strep Antigen 3/8 >> negative   Antimicrobials:  Cefepime 3/7 >> 3/7  Zosyn 3/8 x1  Vanco 3/8 x1  Unasyn 3/8 >> 3/10  Augmentin 3/10 >>  Interim History / Subjective:  Pt reports feeling better  States he has some reflux with overnight feeding but it did not seem to be as much as with the bolus feeds Noted images from Podiatry 3/10 > per notes, no evidence of osteo on imaging per XRAY Daughter at bedside   Objective   Blood pressure 123/64, pulse 70,  temperature 98.4 F (36.9 C), temperature source Oral, resp. rate 20, height 6' (1.829 m), weight 49.2 kg, SpO2 98 %.        Intake/Output Summary (Last 24 hours) at 07/30/2020 1139 Last data filed at 07/30/2020 1024 Gross per 24 hour  Intake 2228.12 ml  Output 2100 ml  Net 128.12 ml   Filed Weights   07/27/20 2222 07/28/20 1429  Weight: 49.2 kg 49.2 kg    Examination: General: chronically ill appearing, cachectic adult male lying in bed in NAD HEENT: MM pink/moist, [place] O2, stoma clean / dry, temporal wasting  Neuro: AAOx4, speech clear, MAE CV: s1s2 RRR, no m/r/g PULM: non-labored at rest, moist cough, lungs distant but clear bilaterally  GI: soft, bsx4 active  Extremities: warm/dry, 1+ BLE pitting edema  Skin: thin skin, multiple areas of ecchymosis                 Resolved Hospital Problem list     Assessment & Plan:   Acute on Chronic Hypoxic Respiratory Failure  Recurrent Aspiration PNA  Possible Component AECOPD Laryngeal Cancer s/p Radiation, Trach (decannulated) Severe Protein Calorie Malnutrition   Discussion: 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 Candise Che. Trach since removed but stoma never healed with lack of soft tissue mass.  He continued to smoke prior to last admission but has since quit.  He is PEG dependent (07/24/3028) for feeding but has issues with suspected recurrent aspiration. He is followed by Dr. Isaiah Serge and was most recently seen on 03/11/20, at that time was noted to have multiple CT scans with migrating areas of consolidation consistent with ongoing aspiration events. 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  Suspect he has recurrent aspiration with bolus feedings but must also consider metastatic disease (most recent PET CT 12/2019 with residual hypermetabolic lesions). PCT only mildly elevated 0.2,  ESR 80 (could be from recurrent aspiration vs undiagnosed autoimmune disease or foot wounds).    -follow up autoimmune labs  -wean O2 for sats 88-95% -reassess ambulatory O2 needs prior to discharge to ensure appropriate titration -upright positioning with feeding  -consider 5 days total abx  -consider repeat outpatient PET imaging, possible FOB  -continue brovana + pulmicort BID while inpatient  -duoneb for home + pulmicort  -Yupelri too costly for home use for patient  -pulmonary hygiene - mobilize as able  -follow up with Dr. Isaiah Serge as outpatient, appt arranged    Best practice (evaluated daily)  Diet: NPO / PEG feeding  Pain/Anxiety/Delirium protocol (if indicated): n/a  VAP protocol (if indicated): n/a  DVT prophylaxis: per primary  GI prophylaxis: per primary  Glucose control: per primary  Mobility: As tolerated  Disposition:per primary   Goals of Care:  Last date  of multidisciplinary goals of care discussion: per primary / palliative care Family and staff present:  Summary of discussion:  Follow up goals of care discussion due:  Code Status: Full Code   Labs   CBC: Recent Labs  Lab 07/27/20 1635 07/28/20 0059 07/29/20 0525  WBC 19.1* 13.2* 12.4*  NEUTROABS 17.5* 11.8* 11.2*  HGB 10.8* 9.9* 11.3*  HCT 36.2* 31.9* 37.9*  MCV 91.6 90.6 91.8  PLT 293 258 071    Basic Metabolic Panel: Recent Labs  Lab 07/27/20 1635 07/28/20 0059 07/29/20 0525  NA 138 137 143  K 4.4 4.1 4.0  CL 98 101 103  CO2 32 31 24  GLUCOSE 83 75 43*  BUN 28* 22 25*  CREATININE 0.88 0.73 0.81  CALCIUM 9.1 8.7* 9.2  MG  --  2.2 2.3  PHOS  --  3.3 4.7*   GFR: Estimated Creatinine Clearance: 64.1 mL/min (by C-G formula based on SCr of 0.81 mg/dL). Recent Labs  Lab 07/27/20 1634 07/27/20 1635 07/27/20 1854 07/27/20 1900 07/28/20 0059 07/29/20 0525  PROCALCITON  --   --   --  0.19 0.21 0.10  WBC  --  19.1*  --   --  13.2* 12.4*  LATICACIDVEN 1.2  --  0.8  --   --   --      Liver Function Tests: Recent Labs  Lab 07/27/20 1635 07/28/20 0059 07/29/20 0525  AST 17 18  --   ALT 12 13  --   ALKPHOS 84 72  --   BILITOT 0.4 0.6  --   PROT 7.4 6.4*  --   ALBUMIN 2.6* 2.0* 3.1*   No results for input(s): LIPASE, AMYLASE in the last 168 hours. No results for input(s): AMMONIA in the last 168 hours.  ABG    Component Value Date/Time   HCO3 32.5 (H) 07/27/2020 2030   O2SAT 31.7 07/27/2020 2030     Coagulation Profile: No results for input(s): INR, PROTIME in the last 168 hours.  Cardiac Enzymes: Recent Labs  Lab 07/28/20 0050  CKTOTAL 23*    HbA1C: No results found for: HGBA1C  CBG: Recent Labs  Lab 07/29/20 0735  GLUCAP 86     Critical care time: n/a    Noe Gens, MSN, APRN, NP-C, AGACNP-BC [facility] 07/30/2020, 11:39 AM   Please see [email or web address] for pager details.   From 7A-7P if no response, please call [phone] After hours, please call ELink [phone]

## 2020-07-30 NOTE — Telephone Encounter (Signed)
Hospital consultation has benn requested for the following patient   Patients Location: [facility][id number]  Diagnosis: Left foot wound-open  Provider Information  Dr. Holli Humbles  [phone]

## 2020-07-31 ENCOUNTER — Telehealth: Payer: Self-pay | Admitting: *Deleted

## 2020-07-31 DIAGNOSIS — L97521 Non-pressure chronic ulcer of other part of left foot limited to breakdown of skin: Secondary | ICD-10-CM

## 2020-07-31 LAB — COMPREHENSIVE METABOLIC PANEL
ALT: 9 U/L (ref 0–44)
AST: 16 U/L (ref 15–41)
Albumin: 3.2 g/dL — ABNORMAL LOW (ref 3.5–5.0)
Alkaline Phosphatase: 52 U/L (ref 38–126)
Anion gap: 10 (ref 5–15)
BUN: 27 mg/dL — ABNORMAL HIGH (ref 8–23)
CO2: 29 mmol/L (ref 22–32)
Calcium: 8.8 mg/dL — ABNORMAL LOW (ref 8.9–10.3)
Chloride: 101 mmol/L (ref 98–111)
Creatinine, Ser: 0.71 mg/dL (ref 0.61–1.24)
GFR, Estimated: >60 mL/min (ref >60)
Glucose, Bld: 103 mg/dL — ABNORMAL HIGH (ref 70–99)
Potassium: 4.5 mmol/L (ref 3.5–5.1)
Sodium: 140 mmol/L (ref 135–145)
Total Bilirubin: 0.5 mg/dL (ref 0.3–1.2)
Total Protein: 6.6 g/dL (ref 6.5–8.1)

## 2020-07-31 LAB — CYCLIC CITRUL PEPTIDE ANTIBODY, IGG/IGA: CCP Antibodies IgG/IgA: >250 units — ABNORMAL HIGH (ref 0–19)

## 2020-07-31 LAB — QUANTIFERON-TB GOLD PLUS (RQFGPL)
QuantiFERON Mitogen Value: 3.76 IU/mL
QuantiFERON Nil Value: 0.09 IU/mL
QuantiFERON TB1 Ag Value: 0.04 IU/mL
QuantiFERON TB2 Ag Value: 0.03 IU/mL

## 2020-07-31 LAB — CBC
HCT: 29.5 % — ABNORMAL LOW (ref 39.0–52.0)
Hemoglobin: 9 g/dL — ABNORMAL LOW (ref 13.0–17.0)
MCH: 27.7 pg (ref 26.0–34.0)
MCHC: 30.5 g/dL (ref 30.0–36.0)
MCV: 90.8 fL (ref 80.0–100.0)
Platelets: 239 10*3/uL (ref 150–400)
RBC: 3.25 MIL/uL — ABNORMAL LOW (ref 4.22–5.81)
RDW: 19.8 % — ABNORMAL HIGH (ref 11.5–15.5)
WBC: 7.3 10*3/uL (ref 4.0–10.5)
nRBC: 0 % (ref 0.0–0.2)

## 2020-07-31 LAB — QUANTIFERON-TB GOLD PLUS: QuantiFERON-TB Gold Plus: NEGATIVE

## 2020-07-31 LAB — ANTINUCLEAR ANTIBODIES, IFA: ANA Ab, IFA: NEGATIVE

## 2020-07-31 MED ORDER — AMOXICILLIN-POT CLAVULANATE 400-57 MG/5ML PO SUSR: 800.0000 mg | Freq: Two times a day (BID) | ORAL | 0 refills | Status: AC

## 2020-07-31 MED ORDER — KATE FARMS STANDARD 1.4 PO LIQD
1312.0000 mL | ORAL | Status: DC
  Filled 2020-07-31: qty 1625

## 2020-07-31 MED ORDER — KATE FARMS STANDARD 1.4 PO LIQD: 325.0000 mL | Freq: Three times a day (TID) | ORAL | 1 refills | Status: AC

## 2020-07-31 MED ORDER — THIAMINE HCL 100 MG PO TABS: 100.0000 mg | ORAL_TABLET | Freq: Every day | ORAL | 0 refills | Status: AC

## 2020-07-31 MED ORDER — KATE FARMS STANDARD 1.4 PO LIQD: 1312.0000 mL | ORAL | 1 refills | Status: AC

## 2020-07-31 NOTE — Discharge Instructions (Signed)
Hospital-Acquired Pneumonia  Hospital-acquired pneumonia is a lung infection that a person can get when in a health care setting or during certain procedures. The infection causes air pouches (sacs) inside the lungs to fill with pus or fluid. Hospital-acquired pneumonia is usually caused by bacteria that are common in health care settings. These bacteria may not be killed by (may be resistant to) some antibiotic medicines. What are the causes? This condition is caused by bacteria that get into your lungs. You can get this condition if you:  Breathe in droplets from an infected person's cough or sneeze.  Touch something that an infected person coughed or sneezed on and then touch your mouth, nose, or eyes.  Have a bacterial infection somewhere else in your body, which spread to your lungs through your blood. What increases the risk? The following factors may make you more likely to develop this condition:  Having a disease that weakens your body's defense system (immune system) or your ability to cough out germs.  Being older than age 63.  Having trouble swallowing.  Using a feeding or breathing tube, or having an IV inserted in a vein.  Having been in an intensive care unit (ICU).  Living in a long-term care facility, such as a nursing home.  Having been in the hospital for 2 or more days in the past 3 months.  Having had your kidneys filtered through hemodialysis in the past 30 days. What are the signs or symptoms? Symptoms of this condition include:  Fever.  Chills.  Cough.  Shortness of breath.  Making crackling or whistling sounds when you breathe (wheezing). How is this diagnosed? This condition may be diagnosed based on:  Your symptoms.  A chest X-ray.  A measurement of the amount of oxygen in your blood.  Laboratory testing. How is this treated? This condition is treated with antibiotics. Your health care provider may take a sample of cells (culture) from  your throat to find out what type of bacteria is in your lungs. He or she may change your antibiotic based on the results.  You may need to be treated at the hospital if you have bacteria in your blood, trouble breathing, or a low oxygen level. At the hospital, you will be given antibiotics through an IV. You may also be given oxygen or breathing treatments. Follow these instructions at home: Activity  Rest as told by your health care provider.  Return to your normal activities as told by your health care provider. Ask your health care provider what activities are safe for you. Lifestyle  Do not use any products that contain nicotine or tobacco, such as cigarettes, e-cigarettes, and chewing tobacco. If you need help quitting, ask your health care provider.  If you drink alcohol: ? Limit how much you use to:  0-1 drink a day for women who are not pregnant.  0-2 drinks a day for men. ? Be aware of how much alcohol is in your drink. In the [place] one drink equals one 12 oz bottle of beer (355 mL), one 5 oz glass of wine (148 mL), or one 1 oz glass of hard liquor (44 mL). General instructions  Take over-the-counter and prescription medicines as told by your health care provider. Finish all antibiotic medicine even when you start to feel better.  Drink enough fluid to keep your urine pale yellow.  Keep all follow-up visits as told by your health care provider. This is important.   How is this prevented? Actions  that I can take To lower your risk of getting this condition again:  Do not smoke. This includes e-cigarettes.  Do not drink too much alcohol.  Keep your immune system healthy by eating well, drinking fluids, and getting enough sleep.  Get a flu shot every year (annually), and get a pneumonia shot if: ? You are older than age 21. ? You smoke. This includes e-cigarettes. ? You have a long-lasting (chronic) condition such as lung disease.  Exercise your lungs by taking deep  breaths, walking, and using an incentive spirometer as directed.  Practice good hygiene and ask others to practice good hygiene. ? Wash your hands often for at least 20 seconds with soap and water. If you cannot use soap and water, use an alcohol-based hand sanitizer. ? Make sure your health care providers wash their hands. Ask them to wash their hands if you do not see them doing so. ? When you are in a health care facility, avoid touching your eyes, nose, and mouth. ? Avoid touching any surface near where people have coughed or sneezed. ? Stand away from sick people when they are coughing or sneezing. ? Wear a mask if you cannot avoid exposure to people who are sick. ? Clean all surfaces often with a cleanser that kills germs (disinfectant), especially if someone is sick at home or work.   Precautions of my health care team Hospitals, nursing homes, and other health care facilities take special care to try to prevent hospital-acquired pneumonia. To do this, your health care team may:  Clean their hands for at least 20 seconds with soap and water or with alcohol-based hand sanitizer before and after caring for sick people.  Wear gloves or masks during treatment.  Sanitize medical instruments, tubes, other equipment, and surfaces in hospital or clinic rooms.  Raise the head of your hospital bed so you are not lying flat. The head of the bed may be raised 30 degrees or more.  Have you sit up and move around as soon as possible after surgery.  Insert a breathing tube only if needed. If you have a breathing tube, they will: ? Clean the inside of your mouth regularly. ? Remove the breathing tube as soon as it is no longer needed. Contact a health care provider if:  Your symptoms do not get better or they get worse.  Your symptoms come back after you have finished taking your antibiotics. Get help right away if you have:  Trouble breathing.  Confusion or trouble thinking. These  symptoms may represent a serious problem that is an emergency. Do not wait to see if the symptoms will go away. Get medical help right away. Call your local emergency services (911 in the [place] Do not drive yourself to the hospital. Summary  Hospital-acquired pneumonia is a lung infection usually caused by bacteria common in health care settings.  Take over-the-counter and prescription medicines as told by your health care provider. Finish all antibiotic medicine even when you start to feel better  Do not smoke.  Eat well, get plenty of rest and fluids, wash your hands often, and keep all follow-up visits as told by your health care provider. This information is not intended to replace advice given to you by your health care provider. Make sure you discuss any questions you have with your health care provider. Document Revised: 04/26/2019 Document Reviewed: 04/26/2019 Elsevier Patient Education  2021 [place].   https://www.cdc.gov/fungal/diseases/pneumocystis-pneumonia/"> https://www.nhlbi.nih.gov/health-topics/pneumonia"> Journal of Fungi ([place], [place]), 4(4), 127.  https://doi.org/10.3390/jof4040127">  Pneumocystis Pneumonia Pneumocystis pneumonia is an infection of the lungs. The infection is extremely rare in healthy people. It most frequently occurs in people who have a weak disease-fighting system (immune system). What are the causes? This condition is caused by breathing in a fungus called Pneumocystis jiroveci. This fungus is common in the environment, but rarely causes illness in healthy people. What are the signs or symptoms? Common symptoms of this condition include:  Fever.  Mild or dry cough.  Shortness of breath, especially with physical activity.  Rapid breathing.  Chills.  Tiredness (fatigue). How is this diagnosed?  This condition may be diagnosed based on: ? Your medical history. ? A physical exam. This condition may be diagnosed with tests, such  as:  Sputum (mucus) tests. To get the sample: ? You may be asked to cough mucus into a cup. ? You may have a procedure where a small, flexible tube is guided to your lungs through your nose or mouth (bronchoscopy).  A chest X-ray.  Blood tests.  A small sample of lung tissue (biopsy). This can be obtained with a bronchoscopy. How is this treated? This condition is treated with antibiotic medicine. Sometimes it is also treated with a medicine to reduce inflammation (steroid). Treatment may begin before test results confirm your diagnosis. This is because the condition is very serious and can be life-threatening. Treatment may take several weeks. Follow these instructions at home: Medicines  Take your antibiotic medicine as told by your health care provider. Do not stop taking the antibiotic even if you start to feel better.  Do not take cough or cold medicines unless your health care provider says that you can take these medicines. Coughing helps get mucus out of your lungs.  Take over-the-counter and prescription medicines only as told by your health care provider. General instructions  Rest as told by your health care provider. Ask your health care provider what activities are safe for you.  Eat healthy foods.  Drink enough fluid to keep your urine pale yellow.  Cover your cough.  Wash your hands frequently with soap and water. If soap and water are not available, use an alcohol-based hand sanitizer.  Do not use any products that contain nicotine or tobacco, such as cigarettes, e-cigarettes, and chewing tobacco. If you need help quitting, ask your health care provider. Smoking can make your condition worse.  Keep all follow-up visits as told by your health care provider. This is important.   How is this prevented? If you are at high risk for getting this condition again, your health care provider may prescribe an antibiotic to prevent this from happening. Contact a health care  provider if:  Any of your symptoms are getting worse instead of better.  You have nausea or vomiting.  You have diarrhea.  You have a skin rash. Get help right away if:  You have difficulty breathing.  You cough up dark mucus or blood.  You have chest pain. Summary  Pneumocystis pneumonia is an infection of the lungs caused by breathing in a fungus called Pneumocystis jiroveci.  Do not use any products that contain nicotine or tobacco, such as cigarettes, e-cigarettes, and chewing tobacco.  Take your antibiotic medicine as told by your health care provider and keep all follow-up appointments. This is important. This information is not intended to replace advice given to you by your health care provider. Make sure you discuss any questions you have with your health care provider. Document Revised: 08/28/2019  Document Reviewed: 07/03/2019 Elsevier Patient Education  2021 [place].   How to Use a Nebulizer, Adult  A nebulizer is a device that turns liquid medicine into a mist or vapor that you can breathe in (inhale). This medicine helps to open the air passages in your lungs. You may need to use a nebulizer if you have an acute breathing illness, such as pneumonia. A nebulizer may also be used to treat chronic conditions, such as asthma or chronic obstructive pulmonarydisease (COPD). There are different kinds of nebulizers. With some nebulizers, you breathe in medicine through a mouthpiece. With others, you get medicine through a mask that fits over your nose and mouth. What are the risks? If you use a nebulizer that does not fit right or is not cleaned properly, it can cause some problems, including:  Infection.  Eye irritation.  Delivery of too much medicine or not enough medicine.  Mouth irritation. Supplies needed:  Air compressor (nebulizer machine).  Nebulizer medicine cup (reservoir)and tubing.  Mouthpiece or face mask.  Soap and water.  Sterile or  distilled water.  Clean towel. How to use a nebulizer Preparing a nebulizer Take these steps before using your nebulizer: 1. Read the manufacturer's instructions for your nebulizer, as machines vary. 2. Check your medicine. Make sure it has not expired and is not damaged in any way. 3. Wash your hands with soap and water. 4. Put all of the parts of your nebulizer on a sturdy, flat surface. 5. Connect the tubing to the nebulizer machine and to the reservoir. 6. Measure the liquid medicine according to instructions from your health care provider. Pour the liquid into the reservoir. 7. Attach the mouthpiece or mask. 8. Test the nebulizer by turning it on to make sure that a spray comes out. Then, turn it off. Using a nebulizer Be sure to stop the machine at any time if you start coughing or if the medicine foams or bubbles. 1. Sit in an upright, relaxed position. 2. If your nebulizer has a mask, put it over your nose and mouth. It should fit somewhat snugly, with no gaps around the nose or cheeks where medicine could escape. If you use a mouthpiece, put it in your mouth. Press your lips firmly around the mouthpiece. 3. Turn on the nebulizer. 4. Some nebulizers have a finger valve. If yours does, cover up the air hole so the air gets to the nebulizer. 5. Once the medicine begins to mist out, take slow, deep breaths. If there is a finger valve, release it at the end of your breath. 6. Continue taking slow, deep breaths until the medicine in the nebulizer is gone and no mist appears.      Cleaning a nebulizer The nebulizer and all of its parts must be kept very clean. If the nebulizer and its parts are not cleaned properly, bacteria can grow inside of them. If you inhale the bacteria, you can get sick. Follow the manufacturer's instructions for cleaning your nebulizer. For most nebulizers, you should follow these guidelines:  Clean the mouthpiece or mask and the reservoir by: ? Rinsing them  after each use. Use sterile or distilled water. ? Washing them 1-2 times a week using soap and warm water.  Do not wash the tubing.  After you rinse or wash them, place the parts on a clean towel and let them air-dry completely. After they dry, reconnect the pieces and turn the nebulizer on without any medicine in it. Doing this will  blow air through the equipment to help dry it out.  Store the nebulizer in a clean and dust-free place.  Check the filter at least one time every week. Replace the filter if it looks dirty. Follow these instructions at home  Use your nebulizer only as told by your health care provider. Do not use the nebulizer more than directed by your health care provider.  Do not use any products that contain nicotine or tobacco, such as cigarettes, e-cigarettes, and chewing tobacco. If you need help quitting, ask your health care provider.  Keep all follow-up visits as told by your health care provider. This is important. Where to find more information  Allergy & Asthma Network: allergyasthmanetwork.org  American Lung Association: www.lung.org Contact a health care provider if:  You have trouble using the nebulizer.  Your nebulizer foams or stops working.  Your nebulizer does not create a mist after you add medicine and turn it on. Get help right away if:  You continue to have trouble breathing.  Your breathing gets worse during a nebulizer treatment. These symptoms may represent a serious problem that is an emergency. Do not wait to see if the symptoms will go away. Get medical help right away. Call your local emergency services (911 in the [place] Do not drive yourself to the hospital. Summary  A nebulizer is a device that turns liquid medicine into a mist (vapor) that you can breathe in (inhale).  Measure the liquid medicine according to instructions from your health care provider. Pour the liquid into the part of the nebulizer that holds the medicine  (reservoir).  Once the medicine begins to mist out, take slow, deep breaths.  Rinse or wash the mouthpiece or mask and the reservoir after each use, and allow them to air-dry completely. This information is not intended to replace advice given to you by your health care provider. Make sure you discuss any questions you have with your health care provider. Document Revised: 01/20/2020 Document Reviewed: 06/19/2019 Elsevier Patient Education  2021 [employer].

## 2020-07-31 NOTE — Progress Notes (Signed)
Nutrition Follow-up  DOCUMENTATION CODES:   Severe malnutrition in context of chronic illness,Underweight  INTERVENTION:   Home regimen recommendations: -325 ml [employer] 1.4 TID via PEG (1200, 1430, 1630) -Free water flush with 30 ml before and after each bolus (180 ml) -Run [employer] 1.4 @ 82 ml/hr over 16 hours (4 cartons or 1312 ml) -1800-1000 -Patient to also use Promod which he has at home, provides 100 kcals and 10g protein per 30 ml. Recommend 60 ml BID of Promod ( or equivalent protein modular) -Additional free water 100 ml every 4 hours -this will provide 3585 kcals, 180g protein and 2418 ml H2O.   NUTRITION DIAGNOSIS:   Severe Malnutrition related to chronic illness,cancer and cancer related treatments as evidenced by severe fat depletion,severe muscle depletion.  Ongoing.  GOAL:   Patient will meet greater than or equal to 90% of their needs  Progressing.  MONITOR:   Labs,Weight trends,TF tolerance,I & O's  ASSESSMENT:   65 y.o. 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 Admitted for aspiration pneumonia.  Patient in room, daughter later came into room during visit.  Pt states he has been doing well with the combination feeds. Reports he has not felt as full or as if the tube feeding is coming up with new regimen.  MD requests night feed rate be lowered. Lowered to 82 ml/hr which will provide 4 cartons overnight. Plan is for pt to go home today. Will need education on pump.  Updated recommendations above with lowered night feeds. Pt to also use Promod at home for additional kcals and protein.   Admission weight: 108 lbs.  Medications: Thiamine  Labs reviewed: CBGs: 86  Diet Order:   Diet Order            Diet NPO time specified  Diet  effective now                 EDUCATION NEEDS:   Education needs have been addressed  Skin:     Last BM:  3/7  Height:   Ht Readings from Last 1 Encounters:  07/28/20 6' (1.829 m)    Weight:   Wt Readings from Last 1 Encounters:  07/28/20 49.2 kg   BMI:  Body mass index is 14.71 kg/m.  Estimated Nutritional Needs:   Kcal:  2800-3000  Protein:  160-180g  Fluid:  >2.5L/day  Clayton Bibles, MS, RD, LDN Inpatient Clinical Dietitian Contact information available via Am[employer]

## 2020-07-31 NOTE — Progress Notes (Signed)
RT NOTE:  Pt refused CPT at this time.  

## 2020-07-31 NOTE — Telephone Encounter (Signed)
Transition Care Management Unsuccessful Follow-up Telephone Call  Date of discharge and from where:  07/31/20 [facility]  Attempts:  1st Attempt  Reason for unsuccessful TCM follow-up call:  Left voice message

## 2020-07-31 NOTE — Discharge Summary (Signed)
Physician Discharge Summary  Ronald Ruiz GXQ:[id number] DOB: 1956-04-26 DOA: 07/27/2020  PCP: [name], MD  Admit date: 07/27/2020 Discharge date: 07/31/2020  Admitted From: Home with home health Disposition: Same  Recommendations for Outpatient Follow-up:  1. Follow up with PCP in 1-2 weeks 2. Please obtain BMP/CBC in one week 3. Please follow up with dietary as well as oncology as scheduled for further evaluation and treatment 4. Follow-up with pulmonology for further evaluation as discussed with possible bronchoscopy  Home Health: Yes Equipment/Devices: Continue oxygen, tube feed pump  Discharge Condition: Guarded CODE STATUS: Full Diet recommendation:   Home regimen recommendations: -325 ml [employer] 1.4 TID via PEG (1200, 1430, 1630) -Free water flush with 30 ml before and after each bolus (180 ml) -Run [employer] 1.4 @ 82 ml/hr over 16 hours (4 cartons or 1312 ml) -1800-1000 -Patient to also use Promod which he has at home, provides 100 kcals and 10g protein per 30 ml. Recommend 60 ml BID of Promod ( or equivalent protein modular) -Additional free water 100 ml every 4 hours -this will provide 3585 kcals, 180g protein and 2418 ml H2O.  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 CT angio chest done on admission negative for PE, waxing and waning of multifocal areas of consolidation seen on prior study, significant progression of  basilar predominant airspace disease since prior exam, findings favor progressive infection or aspiration pneumonia. Metastatic disease difficult to exclude. Tracheostomy with significant mucoid material throughout the mainstem bronchi and lower lobe airways. Patient placed empirically on IV antibiotics. PCCM consultation and podiatry consultation following.  Patient admitted as above with acute hypoxic respiratory failure concerning for aspiration pneumonia versus pneumonitis in the setting of tube feeds.  Concern that patient's tube feed rate was too high and he was having reflux overnight while in a more supine position although he states he attempts to sit upright to limit aspiration events.  At this time patient's antibiotics have been transitioned from IV to p.o. Augmentin with appropriate improvement in symptoms, patient's tube feeds have been decreased with now bolus feeds during the day to offset any calorie losses by decreased rate overnight.  See above dietary recommendations.  10 you to follow along with home health, continue home therapy oxygen and recommending tube feeding pump for better rate control at home to ensure he does not overfeed or receive tube feeds to briskly.  Patient feels much closer to baseline at this point, looking forward to discharge home and follow-up with primary care for ongoing treatment.  Patient also notably had notable skin changes over his left foot concerning for cellulitis, imaging was negative for any deep infection, podiatry evaluated and recommended topical wound care with outpatient follow-up in 1 week.  Patient otherwise stable and agreeable for discharge home with follow-up as outlined.  Discharge Diagnoses:  Active Problems:   Malignant neoplasm of glottis (HCC)   Protein-calorie malnutrition, severe   Dysphagia   Dehydration   Chronic pulmonary aspiration   History of throat cancer   Acute respiratory failure with hypoxia (HCC)   Pressure injury of  skin  COPD exacerbation (HCC)   Aspiration pneumonia of both lower lobes due to gastric secretions (HCC)   Ulcer of left foot (HCC)   History of amputation of lesser toe of left foot [facility])    Discharge Instructions  Discharge Instructions    Call MD for:  difficulty breathing, headache or visual disturbances   Complete by: As directed    Call MD for:  redness, tenderness, or signs of infection (pain, swelling, redness, odor or green/yellow discharge around incision site)   Complete by: As directed    Call MD for:  severe uncontrolled pain   Complete by: As directed    Call MD for:  temperature >100.4   Complete by: As directed    Diet - low sodium heart healthy   Complete by: As directed    Discharge wound care:   Complete by: As directed    Every other day: Betadine to open wounds at medial and lateral foot. Cover with dry gauze. Xeroform over incision 2nd toe area. Cover with dry gauze and kerlix wrap with tape. Change qOD.  Daily: Apply xeroform gauze to inner and outer left foot wounds daily, then cover with gauze and kerlex   Foam dressing  Until discontinued - dressing to sacrum, change Q 3 days or PRN soiling.   Increase activity slowly   Complete by: As directed      Allergies as of 07/31/2020   No Known Allergies     Medication List    STOP taking these medications   ipratropium-albuterol 0.5-2.5 (3) MG/3ML Soln Commonly known as: DUONEB   Loperamide HCl 1 MG/7.5ML Liqd Commonly known as: [employer] Loperamide HCl   revefenacin 175 MCG/3ML nebulizer solution Commonly known as: YUPELRI     TAKE these medications   acetaminophen 160 MG/5ML liquid Commonly known as: TYLENOL Take 500 mg by mouth every 4 (four) hours as needed for fever.   albuterol 108 (90 Base) MCG/ACT inhaler Commonly known as: VENTOLIN HFA Inhale 1-2 puffs into the lungs every 6 (six) hours as needed for wheezing.   amoxicillin-clavulanate 400-57 MG/5ML suspension Commonly known as:  AUGMENTIN Place 10 mLs (800 mg total) into feeding tube every 12 (twelve) hours for 3 days.   budesonide 0.5 MG/2ML nebulizer solution Commonly known as: PULMICORT INHALE CONTENTS OF 1 VIAL VIA NEBULIZER 2 TIMES DAILY What changed: See the new instructions.   methadone 10 MG/ML solution Commonly known as: DOLOPHINE Take 120 mg by mouth daily.   pantoprazole sodium 40 mg/20 mL Pack Commonly known as: PROTONIX Place 20 mLs (40 mg total) into feeding tube daily.   ProMod Liqd Give 30 mL Promod twice daily via PEG. What changed:   how much to take  how to take this  when to take this  additional instructions   feeding supplement ([employer] STANDARD 1.4) Liqd liquid Place 1,312 mLs into feeding tube as directed. -Run [employer] 1.4 @ 82 ml/hr over 16 hours (4 cartons or 1312 ml) -1800-1000 What changed:   how much to take  when to take this  additional instructions   feeding supplement ([employer] STANDARD 1.4) Liqd liquid Place 325 mLs into feeding tube 3 (three) times daily between meals. What changed: You were already taking a medication with the same name, and this prescription was added. Make sure you understand how and when to take each.   thiamine 100 MG tablet Place 1 tablet (100 mg total) into feeding tube daily. Start taking on: August 01, 2020  Discharge Care Instructions  (From admission, onward)         Start     Ordered   07/31/20 0000  Discharge wound care:       Comments: Every other day: Betadine to open wounds at medial and lateral foot. Cover with dry gauze. Xeroform over incision 2nd toe area. Cover with dry gauze and kerlix wrap with tape. Change qOD.  Daily: Apply xeroform gauze to inner and outer left foot wounds daily, then cover with gauze and kerlex   Foam dressing  Until discontinued - dressing to sacrum, change Q 3 days or PRN soiling.   07/31/20 1232          Follow-up Information    Marshell Garfinkel, MD Follow up  on 08/25/2020.   Specialty: Pulmonary Disease Why: Appt at 11:15.  Please arrive at 11:00 for check in.  Contact information: [place] [address] [phone]        [facility] Follow up.   Why: [place] nursing/physical therapy/occupational therapy/social worker/aide       [facility] Follow up.   Why: Home oxygen;TF formula, & pump. Contact information: [place] [address] [phone]              No Known Allergies  Consultations:  Podiatry, oncology, pulmonology   Procedures/Studies: CT Angio Chest PE W and/or Wo Contrast  Result Date: 07/27/2020 CLINICAL DATA:  Short of breath since this morning, productive drainage from tracheostomy EXAM: CT ANGIOGRAPHY CHEST WITH CONTRAST TECHNIQUE: Multidetector CT imaging of the chest was performed using the standard protocol during bolus administration of intravenous contrast. Multiplanar CT image reconstructions and MIPs were obtained to evaluate the vascular anatomy. CONTRAST:  160mL OMNIPAQUE IOHEXOL 350 MG/ML SOLN COMPARISON:  07/27/2020, 03/03/2020 FINDINGS: Cardiovascular: This is a technically adequate evaluation of the pulmonary vasculature. No filling defects or pulmonary emboli. The heart is unremarkable without pericardial effusion. Extensive atherosclerosis of the coronary vasculature. The thoracic aorta is unremarkable without aneurysm or dissection. Mild atherosclerosis of the aortic arch. Mediastinum/Nodes: No pathologic adenopathy. The thyroid and esophagus are grossly normal. Tracheostomy is identified. There is mucoid material within the mainstem bronchi bilaterally. Lungs/Pleura: Extensive background emphysema is again identified. There is waxing and waning appearance of the multiple nodular areas of consolidation seen previously. In area of dense consolidation within the lateral basilar segment of the right lower lobe on prior study measuring 8.8 x 4.0 cm has near  completely resolved, with small residual cavitary areas remaining, measuring 2.1 x 1.1 cm image 94/6. The small nodule with central cavitation in the right lower lobe on prior study image 122/3 has increased in size now measuring 2.4 x 1.8 cm. New dense areas of consolidation are seen within the left lower lobe and lingula. There is progressive bilateral bronchiectasis. Overall, the appearance suggests progressive infection, including aspiration or atypical organisms such as fungal pneumonia. Given history of head and neck cancer, metastatic disease would be difficult to exclude, and close follow-up is warranted. There is no effusion or pneumothorax. Upper Abdomen: Percutaneous gastrostomy tube is identified. No acute upper abdominal findings. Musculoskeletal: The patient is markedly cachectic. There are no acute or destructive bony lesions. Reconstructed images demonstrate no additional findings. Review of the MIP images confirms the above findings. IMPRESSION: 1. No evidence of pulmonary embolus. 2. Waxing and waning of multifocal areas of consolidation seen on prior study. Overall, there has been significant progression of the basilar predominant airspace  disease since prior exam. The findings favor progressive infection or aspiration pneumonia. Metastatic disease would be difficult to exclude, and close imaging follow-up is recommended. 3. Tracheostomy, with significant mucoid material throughout the mainstem bronchi and lower lobe airways. 4. Aortic Atherosclerosis (ICD10-I70.0). Coronary artery atherosclerosis. Electronically Signed   By: Randa Ngo M.D.   On: 07/27/2020 21:51   DG Chest Portable 1 View  Result Date: 07/27/2020 CLINICAL DATA:  Shortness of breath. EXAM: PORTABLE CHEST 1 VIEW COMPARISON:  Most recent radiograph 06/23/2020.  Chest CT 03/03/2020 FINDINGS: Emphysema with chronic hyperinflation and bronchial thickening. Persistent patchy bilateral airspace opacities. New patchy airspace  disease at the left lung base. There is biapical pleuroparenchymal scarring. Heart is normal in size. Blunting of the costophrenic angles was seen previously and likely related to hyperinflation. No large effusion. No pneumothorax. No pulmonary edema. IMPRESSION: 1. Persistent heterogeneous bilateral lung opacities since radiographs last month, with new left basilar airspace disease. Differential considerations include pneumonia (including aspiration) versus malignancy. 2. Emphysema with chronic hyperinflation. Electronically Signed   By: Keith Rake M.D.   On: 07/27/2020 17:32   DG Foot 2 Views Left  Result Date: 07/28/2020 CLINICAL DATA:  Left foot wound EXAM: LEFT FOOT - 2 VIEW COMPARISON:  06/29/2020 FINDINGS: Two view radiograph left foot demonstrates surgical changes of second digit amputation distal to the second metatarsophalangeal joint. Moderate first ray bunion deformity is seen with moderate degenerative arthritis of the first metatarsophalangeal joint. No acute fracture or dislocation. No destructive osseous lesion. There is moderate soft tissue swelling of the left forefoot diffusely. Moderate bimalleolar soft tissue swelling is incidentally noted. IMPRESSION: Status post second digit amputation. Diffuse soft tissue swelling. No focal destructive osseous lesion identified to suggest focal osteomyelitis. Electronically Signed   By: Fidela Salisbury MD   On: 07/28/2020 02:57     Subjective: No acute issues or events overnight tolerating tube feeds at lower rate quite well otherwise denies nausea vomiting diarrhea constipation headache fevers or chills.   Discharge Exam: Vitals:   07/31/20 0731 07/31/20 0734  BP:    Pulse:  77  Resp:  20  Temp:    SpO2: 99%    Vitals:   07/31/20 0134 07/31/20 0536 07/31/20 0731 07/31/20 0734  BP:  118/61    Pulse:  75  77  Resp:  18  20  Temp:  98.3 F (36.8 C)    TempSrc:  Oral    SpO2: 94% 96% 99%   Weight:      Height:        General:   Pleasantly resting in bed, No acute distress.  Gaunt/cachectic in appearance somewhat emaciated with temporal wasting. HEENT:  Normocephalic atraumatic.  Sclerae nonicteric, noninjected.  Extraocular movements intact bilaterally. Neck:  Without mass or deformity.  Trachea is midline. Lungs: Coarse breath sounds bilaterally, PMI right basilar area without overt wheeze or rales Heart:  Regular rate and rhythm.  Without murmurs, rubs, or gallops. Abdomen:  Soft, nontender, nondistended.  Without guarding or rebound. Extremities: Without cyanosis, clubbing, edema Skin:  Warm and dry, no erythema, stage III sacral decubitus ulcer, left foot bandage clean dry intact    The results of significant diagnostics from this hospitalization (including imaging, microbiology, ancillary and laboratory) are listed below for reference.     Microbiology: Recent Results (from the past 240 hour(s))  Resp Panel by RT-PCR (Flu A&B, Covid) Nasopharyngeal Swab     Status: None   Collection Time: 07/27/20  7:00 PM  Specimen: Nasopharyngeal Swab; Nasopharyngeal(NP) swabs in vial transport medium  Result Value Ref Range Status   SARS Coronavirus 2 by RT PCR NEGATIVE NEGATIVE Final    Comment: (NOTE) SARS-CoV-2 target nucleic acids are NOT DETECTED.  The SARS-CoV-2 RNA is generally detectable in upper respiratory specimens during the acute phase of infection. The lowest concentration of SARS-CoV-2 viral copies this assay can detect is 138 copies/mL. A negative result does not preclude SARS-Cov-2 infection and should not be used as the sole basis for treatment or other patient management decisions. A negative result may occur with  improper specimen collection/handling, submission of specimen other than nasopharyngeal swab, presence of viral mutation(s) within the areas targeted by this assay, and inadequate number of viral copies(<138 copies/mL). A negative result must be combined with clinical observations,  patient history, and epidemiological information. The expected result is Negative.  Fact Sheet for Patients:  EntrepreneurPulse.com.au  Fact Sheet for Healthcare Providers:  IncredibleEmployment.be  This test is no t yet approved or cleared by the [place] FDA and  has been authorized for detection and/or diagnosis of SARS-CoV-2 by FDA under an Emergency Use Authorization (EUA). This EUA will remain  in effect (meaning this test can be used) for the duration of the COVID-19 declaration under Section 564(b)(1) of the Act, 21 U.S.C.section 360bbb-3(b)(1), unless the authorization is terminated  or revoked sooner.       Influenza A by PCR NEGATIVE NEGATIVE Final   Influenza B by PCR NEGATIVE NEGATIVE Final    Comment: (NOTE) The Xpert Xpress SARS-CoV-2/FLU/RSV plus assay is intended as an aid in the diagnosis of influenza from Nasopharyngeal swab specimens and should not be used as a sole basis for treatment. Nasal washings and aspirates are unacceptable for Xpert Xpress SARS-CoV-2/FLU/RSV testing.  Fact Sheet for Patients: EntrepreneurPulse.com.au  Fact Sheet for Healthcare Providers: IncredibleEmployment.be  This test is not yet approved or cleared by the [place] FDA and has been authorized for detection and/or diagnosis of SARS-CoV-2 by FDA under an Emergency Use Authorization (EUA). This EUA will remain in effect (meaning this test can be used) for the duration of the COVID-19 declaration under Section 564(b)(1) of the Act, 21 U.S.C. section 360bbb-3(b)(1), unless the authorization is terminated or revoked.  Performed at [facility], [place] [address], [place], [place] [address]   Culture, blood (routine x 2) Call MD if unable to obtain prior to antibiotics being given     Status: None (Preliminary result)   Collection Time: 07/28/20 12:59 AM   Specimen: BLOOD  Result  Value Ref Range Status   Specimen Description   Final    BLOOD BLOOD RIGHT ARM Performed at [place] [address] [place], [place] [address]    Special Requests   Final    BOTTLES DRAWN AEROBIC ONLY Blood Culture adequate volume Performed at [place] [address], [place], [place] [address]    Culture   Final    NO GROWTH 2 DAYS Performed at [facility] [address], [place], [place] [address]    Report Status PENDING  Incomplete  Culture, blood (routine x 2) Call MD if unable to obtain prior to antibiotics being given     Status: None (Preliminary result)   Collection Time: 07/28/20 12:59 AM   Specimen: BLOOD  Result Value Ref Range Status   Specimen Description   Final    BLOOD BLOOD RIGHT WRIST Performed at [facility], [address] [place], [place] [address]    Special Requests   Final    BOTTLES DRAWN AEROBIC ONLY Blood Culture adequate volume Performed at [facility] [address] [place], [place] [address]    Culture   Final    NO GROWTH 2 DAYS Performed at [place] [address], [place], [place] [address]    Report Status PENDING  Incomplete  MRSA PCR Screening     Status: None   Collection Time: 07/28/20  2:00 AM   Specimen: Nasal Mucosa; Nasopharyngeal  Result Value Ref Range Status   MRSA by PCR NEGATIVE NEGATIVE Final    Comment:        The GeneXpert MRSA Assay (FDA approved for NASAL specimens only), is one component of a comprehensive MRSA colonization surveillance program. It is not intended to diagnose MRSA infection nor to guide or monitor treatment for MRSA infections. Performed at [facility], [place] [address] [place], [place] [address]   Expectorated Sputum Assessment w Gram Stain, Rflx to Resp Cult     Status: None   Collection Time: 07/28/20  1:57 PM  Result Value Ref Range Status   Specimen Description  EXPECTORATED SPUTUM  Final   Special Requests NONE  Final   Sputum evaluation   Final    THIS SPECIMEN IS ACCEPTABLE FOR SPUTUM CULTURE Performed at [facility], [place] [address], [place], [place] [address]    Report Status 07/28/2020 FINAL  Final  Culture, Respiratory w Gram Stain     Status: None (Preliminary result)   Collection Time: 07/28/20  1:57 PM  Result Value Ref Range Status   Specimen Description   Final    EXPECTORATED SPUTUM Performed at [facility] [address], [place], [place] [address]    Special Requests   Final    NONE Reflexed from [phone] Performed at [facility] [address], [place], [place] [address]    Gram Stain   Final    ABUNDANT WBC PRESENT, PREDOMINANTLY PMN ABUNDANT GRAM POSITIVE RODS MODERATE GRAM POSITIVE COCCI    Culture   Final    CULTURE REINCUBATED FOR BETTER GROWTH Performed at [facility], [place] [address], [place], [place] [address]    Report Status PENDING  Incomplete     Labs: BNP (last 3 results) Recent Labs    07/27/20 1627  BNP 854.6*   Basic Metabolic Panel: Recent Labs  Lab 07/27/20 1635 07/28/20 0059 07/29/20 0525 07/31/20 0543  NA 138 137 143 140  K 4.4 4.1 4.0 4.5  CL 98 101 103 101  CO2 32 31 24 29   GLUCOSE 83 75 43* 103*  BUN 28* 22 25* 27*  CREATININE 0.88 0.73 0.81 0.71  CALCIUM 9.1 8.7* 9.2 8.8*  MG  --  2.2 2.3  --   PHOS  --  3.3 4.7*  --    Liver Function Tests: Recent Labs  Lab 07/27/20 1635 07/28/20 0059 07/29/20 0525 07/31/20 0543  AST 17 18  --  16  ALT 12 13  --  9  ALKPHOS 84 72  --  52  BILITOT 0.4 0.6  --  0.5  PROT 7.4 6.4*  --  6.6  ALBUMIN 2.6* 2.0* 3.1* 3.2*   No results for input(s): LIPASE, AMYLASE in the last 168 hours. No results for input(s): AMMONIA in the last 168 hours. CBC: Recent Labs  Lab 07/27/20 1635 07/28/20 0059 07/29/20  1914 07/31/20 0543  WBC 19.1* 13.2* 12.4* 7.3  NEUTROABS 17.5*  11.8* 11.2*  --   HGB 10.8* 9.9* 11.3* 9.0*  HCT 36.2* 31.9* 37.9* 29.5*  MCV 91.6 90.6 91.8 90.8  PLT 293 258 262 239   Cardiac Enzymes: Recent Labs  Lab 07/28/20 0050  CKTOTAL 23*   BNP: Invalid input(s): POCBNP CBG: Recent Labs  Lab 07/29/20 0735  GLUCAP 86   D-Dimer No results for input(s): DDIMER in the last 72 hours. Hgb A1c No results for input(s): HGBA1C in the last 72 hours. Lipid Profile No results for input(s): CHOL, HDL, LDLCALC, TRIG, CHOLHDL, LDLDIRECT in the last 72 hours. Thyroid function studies No results for input(s): TSH, T4TOTAL, T3FREE, THYROIDAB in the last 72 hours.  Invalid input(s): FREET3 Anemia work up No results for input(s): VITAMINB12, FOLATE, FERRITIN, TIBC, IRON, RETICCTPCT in the last 72 hours. Urinalysis    Component Value Date/Time   COLORURINE YELLOW 07/02/2019 0827   APPEARANCEUR HAZY (A) 07/02/2019 0827   LABSPEC 1.014 07/02/2019 0827   PHURINE 5.0 07/02/2019 0827   GLUCOSEU NEGATIVE 07/02/2019 0827   HGBUR NEGATIVE 07/02/2019 0827   BILIRUBINUR NEGATIVE 07/02/2019 0827   KETONESUR NEGATIVE 07/02/2019 0827   PROTEINUR NEGATIVE 07/02/2019 0827   NITRITE NEGATIVE 07/02/2019 0827   LEUKOCYTESUR TRACE (A) 07/02/2019 0827   Sepsis Labs Invalid input(s): PROCALCITONIN,  WBC,  LACTICIDVEN Microbiology Recent Results (from the past 240 hour(s))  Resp Panel by RT-PCR (Flu A&B, Covid) Nasopharyngeal Swab     Status: None   Collection Time: 07/27/20  7:00 PM   Specimen: Nasopharyngeal Swab; Nasopharyngeal(NP) swabs in vial transport medium  Result Value Ref Range Status   SARS Coronavirus 2 by RT PCR NEGATIVE NEGATIVE Final    Comment: (NOTE) SARS-CoV-2 target nucleic acids are NOT DETECTED.  The SARS-CoV-2 RNA is generally detectable in upper respiratory specimens during the acute phase of infection. The lowest concentration of SARS-CoV-2 viral copies this assay can detect is 138 copies/mL. A negative result does not preclude  SARS-Cov-2 infection and should not be used as the sole basis for treatment or other patient management decisions. A negative result may occur with  improper specimen collection/handling, submission of specimen other than nasopharyngeal swab, presence of viral mutation(s) within the areas targeted by this assay, and inadequate number of viral copies(<138 copies/mL). A negative result must be combined with clinical observations, patient history, and epidemiological information. The expected result is Negative.  Fact Sheet for Patients:  EntrepreneurPulse.com.au  Fact Sheet for Healthcare Providers:  IncredibleEmployment.be  This test is no t yet approved or cleared by the [place] FDA and  has been authorized for detection and/or diagnosis of SARS-CoV-2 by FDA under an Emergency Use Authorization (EUA). This EUA will remain  in effect (meaning this test can be used) for the duration of the COVID-19 declaration under Section 564(b)(1) of the Act, 21 U.S.C.section 360bbb-3(b)(1), unless the authorization is terminated  or revoked sooner.       Influenza A by PCR NEGATIVE NEGATIVE Final   Influenza B by PCR NEGATIVE NEGATIVE Final    Comment: (NOTE) The Xpert Xpress SARS-CoV-2/FLU/RSV plus assay is intended as an aid in the diagnosis of influenza from Nasopharyngeal swab specimens and should not be used as a sole basis for treatment. Nasal washings and aspirates are unacceptable for Xpert Xpress SARS-CoV-2/FLU/RSV testing.  Fact Sheet for Patients: EntrepreneurPulse.com.au  Fact Sheet for Healthcare Providers: IncredibleEmployment.be  This test is not yet approved or cleared by the [place]  States FDA and has been authorized for detection and/or diagnosis of SARS-CoV-2 by FDA under an Emergency Use Authorization (EUA). This EUA will remain in effect (meaning this test can be used) for the duration of  the COVID-19 declaration under Section 564(b)(1) of the Act, 21 U.S.C. section 360bbb-3(b)(1), unless the authorization is terminated or revoked.  Performed at [facility], [place] [address] [place], [place] [address]   Culture, blood (routine x 2) Call MD if unable to obtain prior to antibiotics being given     Status: None (Preliminary result)   Collection Time: 07/28/20 12:59 AM   Specimen: BLOOD  Result Value Ref Range Status   Specimen Description   Final    BLOOD BLOOD RIGHT ARM Performed at [facility] [address], [place], [place] [address]    Special Requests   Final    BOTTLES DRAWN AEROBIC ONLY Blood Culture adequate volume Performed at [facility] [address], [place], [place] [address]    Culture   Final    NO GROWTH 2 DAYS Performed at [facility] [address] [place], [place] [address]    Report Status PENDING  Incomplete  Culture, blood (routine x 2) Call MD if unable to obtain prior to antibiotics being given     Status: None (Preliminary result)   Collection Time: 07/28/20 12:59 AM   Specimen: BLOOD  Result Value Ref Range Status   Specimen Description   Final    BLOOD BLOOD RIGHT WRIST Performed at [facility] [address], [place], [place] [address]    Special Requests   Final    BOTTLES DRAWN AEROBIC ONLY Blood Culture adequate volume Performed at [facility] [address] [place], [place] [address]    Culture   Final    NO GROWTH 2 DAYS Performed at [facility] [place] [address], [place], [place] [address]    Report Status PENDING  Incomplete  MRSA PCR Screening     Status: None   Collection Time: 07/28/20  2:00 AM   Specimen: Nasal Mucosa; Nasopharyngeal  Result Value Ref Range Status   MRSA by PCR NEGATIVE NEGATIVE Final    Comment:        The GeneXpert MRSA Assay (FDA approved for NASAL specimens only), is one  component of a comprehensive MRSA colonization surveillance program. It is not intended to diagnose MRSA infection nor to guide or monitor treatment for MRSA infections. Performed at [facility], [place] [address], [place], [place] [address]   Expectorated Sputum Assessment w Gram Stain, Rflx to Resp Cult     Status: None   Collection Time: 07/28/20  1:57 PM  Result Value Ref Range Status   Specimen Description EXPECTORATED SPUTUM  Final   Special Requests NONE  Final   Sputum evaluation   Final    THIS SPECIMEN IS ACCEPTABLE FOR SPUTUM CULTURE Performed at [facility], [place] [address], [place], [place] [address]    Report Status 07/28/2020 FINAL  Final  Culture, Respiratory w Gram Stain     Status: None (Preliminary result)   Collection Time: 07/28/20  1:57 PM  Result Value Ref Range Status   Specimen Description   Final    EXPECTORATED SPUTUM Performed at [facility] [address], [place], [place] [address]    Special Requests   Final    NONE Reflexed from [id number] Performed at  [facility], [place] [address] [place], [place] [address]    Gram Stain   Final    ABUNDANT WBC PRESENT, PREDOMINANTLY PMN ABUNDANT GRAM POSITIVE RODS MODERATE GRAM POSITIVE COCCI    Culture   Final    CULTURE REINCUBATED FOR BETTER GROWTH Performed at [facility], [place] [address] [place], [place] [address]    Report Status PENDING  Incomplete     Time coordinating discharge: Over 30 minutes  SIGNED:   Little Ishikawa, DO [facility] 07/31/2020, 1:31 PM Pager   If 7PM-7AM, please contact night-coverage [email or web address]

## 2020-07-31 NOTE — Plan of Care (Signed)

## 2020-07-31 NOTE — TOC Transition Note (Signed)
Transition of Care [facility]) - CM/SW Discharge Note   Patient Details  Name: Ronald Ruiz MRN: [id number] Date of Birth: February 11, 1956  Transition of Care [facility]) CM/SW Contact:  Dessa Phi, RN Phone Number: 07/31/2020, 1:10 PM   Clinical Narrativ/c d/c today home with [facility] aware. [employer] home 02-travel tank to be brought to home, they will provie TF & formula. Family has own transport.    Barriers to Discharge: No Barriers Identified   Patient Goals and CMS Choice        Discharge Placement                       Discharge Plan and Services                                     Social Determinants of Health (SDOH) Interventions     Readmission Risk Interventions Readmission Risk Prevention Plan 07/28/2020 07/04/2019  Transportation Screening Complete Complete  Medication Review [employer]) Complete Complete  PCP or Specialist appointment within 3-5 days of discharge Complete Complete  HRI or [place] Complete Complete  SW Recovery Care/Counseling Consult Complete Complete  Palliative Care Screening Not Applicable Not [place] Not Applicable Not Applicable  Some recent data might be hidden

## 2020-07-31 NOTE — Telephone Encounter (Signed)
Patient was seen. 

## 2020-08-01 LAB — CULTURE, RESPIRATORY W GRAM STAIN: Culture: NORMAL

## 2020-08-02 LAB — CULTURE, BLOOD (ROUTINE X 2)
Culture: NO GROWTH
Culture: NO GROWTH
Special Requests: ADEQUATE
Special Requests: ADEQUATE

## 2020-08-03 ENCOUNTER — Telehealth: Payer: Self-pay | Admitting: *Deleted

## 2020-08-03 NOTE — Telephone Encounter (Signed)
Error

## 2020-08-04 ENCOUNTER — Telehealth: Payer: Self-pay | Admitting: Pulmonary Disease

## 2020-08-04 NOTE — Telephone Encounter (Signed)
Patient had a CT angio on 07/27/20. Patient was last seen by Dr. Vaughan Browner on 03/11/20 and a CT chest with contrast was ordered to be completed in 6 months to follow up on recurrent aspiration.   Dr. Vaughan Browner, please advise if you would like for him to have the CT chest. Thanks!

## 2020-08-05 NOTE — Telephone Encounter (Signed)
Tried to call [facility] but was never able to get anyone on the phone. I have cancelled the order that had been placed for pt's CT. Nothing further needed.

## 2020-08-05 NOTE — Telephone Encounter (Signed)
We can cancel the CT chest

## 2020-08-06 ENCOUNTER — Telehealth: Payer: Self-pay | Admitting: *Deleted

## 2020-08-06 NOTE — Telephone Encounter (Signed)
TC to [facility] After talking with Dr. Livia Snellen he will not be signing [facility] orders for patient since he has not been seen since 06/14/18

## 2020-08-10 ENCOUNTER — Ambulatory Visit (INDEPENDENT_AMBULATORY_CARE_PROVIDER_SITE_OTHER): Payer: 59 | Admitting: Family Medicine

## 2020-08-10 ENCOUNTER — Encounter: Payer: Self-pay | Admitting: Family Medicine

## 2020-08-10 ENCOUNTER — Other Ambulatory Visit: Payer: Self-pay

## 2020-08-10 ENCOUNTER — Ambulatory Visit (INDEPENDENT_AMBULATORY_CARE_PROVIDER_SITE_OTHER): Payer: 59

## 2020-08-10 VITALS — BP 108/69 | HR 91 | Temp 97.5°F | Ht 72.0 in | Wt 100.8 lb

## 2020-08-10 DIAGNOSIS — E43 Unspecified severe protein-calorie malnutrition: Secondary | ICD-10-CM

## 2020-08-10 DIAGNOSIS — J449 Chronic obstructive pulmonary disease, unspecified: Secondary | ICD-10-CM

## 2020-08-10 DIAGNOSIS — C320 Malignant neoplasm of glottis: Secondary | ICD-10-CM

## 2020-08-10 DIAGNOSIS — Z7189 Other specified counseling: Secondary | ICD-10-CM

## 2020-08-10 DIAGNOSIS — T17908A Unspecified foreign body in respiratory tract, part unspecified causing other injury, initial encounter: Secondary | ICD-10-CM | POA: Diagnosis not present

## 2020-08-10 NOTE — Progress Notes (Signed)
Subjective:  Patient ID: Ronald Ruiz, male    DOB: 1955/11/26  Age: 65 y.o. MRN: [id number]  CC: No chief complaint on file.   HPI Ronald Ruiz presents for follow-up on his advanced lung cancer.  He is now eating from a PEG tube as result of aspiration.  His daughter is with him today approximately checked for aspiration pneumonia.  He sleeps with his head elevated to help with that problem as well.  Patient has a tracheostomy in place.  He is not wearing the hardware associated with it.  He is unable to speak.  He does mouth some words.  Patient is emaciated and his appetite has been poor his daughter said the hope is that with the tube feedings they can be fed him up some.  Palliative care was recommended for him at his recent hospitalization.  That was declined in favor of doing self-care with the tube feedings at home.  His daughter said he does not do much anymore he just rests during the day.  Hospital record from 372 07/31/2020 was reviewed.  Depression screen [facility] 2/9 08/10/2020 06/14/2018  Decreased Interest 0 3  Down, Depressed, Hopeless 0 2  PHQ - 2 Score 0 5  Altered sleeping - 2  Tired, decreased energy - 2  Change in appetite - 3  Feeling bad or failure about yourself  - 0  Trouble concentrating - 0  Moving slowly or fidgety/restless - 0  Suicidal thoughts - 1  PHQ-9 Score - 13    History [name] has a past medical history of Acute respiratory failure with hypoxia ([place]) (06/23/2020), Bronchitis (03/2018), Community acquired pneumonia (06/23/2020), COPD (chronic obstructive pulmonary disease) ([place]), Dysphagia (07/31/2018), Herpes, laryngeal ca (dx'd 06/2018), Lung nodule (2020), Severe sepsis ([place]) (06/23/2020), and Substance abuse ([place]).   He has a past surgical history that includes Direct laryngoscopy (N/A, 07/21/2018); IR GASTROSTOMY TUBE MOD SED (08/02/2018); and Amputation toe (Left, 06/29/2020).   His family history includes Alcohol abuse in his father; Cancer in his  father; Cirrhosis in his father; Drug abuse in his brother.He reports that he has quit smoking. His smoking use included cigarettes. He has a 100.00 pack-year smoking history. He has never used smokeless tobacco. He reports previous alcohol use. He reports previous drug use.    ROS Review of Systems  Unable to perform ROS: Patient nonverbal    Objective:  BP 108/69   Pulse 91   Temp (!) 97.5 F (36.4 C)   Ht 6' (1.829 m)   Wt 100 lb 12.8 oz (45.7 kg)   SpO2 96%   BMI 13.67 kg/m   BP Readings from Last 3 Encounters:  08/10/20 108/69  07/31/20 118/61  07/10/20 103/60    Wt Readings from Last 3 Encounters:  08/10/20 100 lb 12.8 oz (45.7 kg)  07/28/20 108 lb 7.5 oz (49.2 kg)  07/06/20 114 lb 3.2 oz (51.8 kg)     Physical Exam Vitals reviewed.  Constitutional:      Appearance: He is well-developed. He is ill-appearing (Cachectic).  HENT:     Head: Normocephalic and atraumatic.     Right Ear: Tympanic membrane and external ear normal. No decreased hearing noted.     Left Ear: Tympanic membrane and external ear normal. No decreased hearing noted.     Mouth/Throat:     Pharynx: No oropharyngeal exudate or posterior oropharyngeal erythema.  Eyes:     Pupils: Pupils are equal, round, and reactive to light.  Cardiovascular:  Rate and Rhythm: Normal rate and regular rhythm.     Heart sounds: No murmur heard.   Pulmonary:     Effort: No respiratory distress.     Breath sounds: Rales (At both bases) present.  Abdominal:     General: Bowel sounds are normal.     Palpations: Abdomen is soft. There is no mass.     Tenderness: There is no abdominal tenderness.  Musculoskeletal:     Cervical back: Normal range of motion and neck supple.       Assessment & Plan:   Diagnoses and all orders for this visit:  Chronic obstructive pulmonary disease, unspecified COPD type ([place]) -     DG Chest 2 View; Future  Chronic pulmonary aspiration, initial encounter  Counseling  regarding advance care planning and goals of care  Protein-calorie malnutrition, severe  Malignant neoplasm of glottis ([place])   Do any supportive care I can for him.  He is seeing pulmonology actively.  He is no longer seeing oncology.  He is continues to follow with podiatry for the toe amputation.    I am having Ashten Prats "Tim" maintain his albuterol, ProMod, budesonide, pantoprazole sodium, methadone, acetaminophen, thiamine, feeding supplement (KATE FARMS STANDARD 1.4), and feeding supplement (KATE FARMS STANDARD 1.4).  Allergies as of 08/10/2020   No Known Allergies     Medication List       Accurate as of August 10, 2020 11:54 AM. If you have any questions, ask your nurse or doctor.        acetaminophen 160 MG/5ML liquid Commonly known as: TYLENOL Take 500 mg by mouth every 4 (four) hours as needed for fever.   albuterol 108 (90 Base) MCG/ACT inhaler Commonly known as: VENTOLIN HFA Inhale 1-2 puffs into the lungs every 6 (six) hours as needed for wheezing.   budesonide 0.5 MG/2ML nebulizer solution Commonly known as: PULMICORT INHALE CONTENTS OF 1 VIAL VIA NEBULIZER 2 TIMES DAILY What changed: See the new instructions.   methadone 10 MG/ML solution Commonly known as: DOLOPHINE Take 120 mg by mouth daily.   pantoprazole sodium 40 mg/20 mL Pack Commonly known as: PROTONIX Place 20 mLs (40 mg total) into feeding tube daily.   ProMod Liqd Give 30 mL Promod twice daily via PEG. What changed:   how much to take  how to take this  when to take this  additional instructions   feeding supplement (KATE FARMS STANDARD 1.4) Liqd liquid Place 1,312 mLs into feeding tube as directed. -Run [employer] 1.4 @ 82 ml/hr over 16 hours (4 cartons or 1312 ml) -1800-1000 What changed: Another medication with the same name was changed. Make sure you understand how and when to take each.   feeding supplement (KATE FARMS STANDARD 1.4) Liqd liquid Place 325 mLs into  feeding tube 3 (three) times daily between meals. What changed: Another medication with the same name was changed. Make sure you understand how and when to take each.   thiamine 100 MG tablet Place 1 tablet (100 mg total) into feeding tube daily.        Follow-up: Return in about 3 months (around 11/10/2020).  [name], M.D.

## 2020-08-11 ENCOUNTER — Other Ambulatory Visit: Payer: Self-pay | Admitting: Family Medicine

## 2020-08-11 MED ORDER — LEVOFLOXACIN 500 MG PO TABS: 500.0000 mg | ORAL_TABLET | Freq: Every day | ORAL | 0 refills | Status: AC

## 2020-08-11 NOTE — Progress Notes (Signed)
Pt. Was notified of results and plan of action.  Through his daughter Altamease Oiler.  I sent in a prescription for levofloxacin. WS

## 2020-08-12 ENCOUNTER — Ambulatory Visit (INDEPENDENT_AMBULATORY_CARE_PROVIDER_SITE_OTHER): Payer: 59 | Admitting: Podiatry

## 2020-08-12 ENCOUNTER — Other Ambulatory Visit: Payer: Self-pay

## 2020-08-12 DIAGNOSIS — L97512 Non-pressure chronic ulcer of other part of right foot with fat layer exposed: Secondary | ICD-10-CM

## 2020-08-12 DIAGNOSIS — Z89422 Acquired absence of other left toe(s): Secondary | ICD-10-CM

## 2020-08-13 ENCOUNTER — Encounter: Payer: Self-pay | Admitting: Podiatry

## 2020-08-13 NOTE — Progress Notes (Signed)
Subjective:  Patient ID: Ronald Ruiz, male    DOB: [birth year],  MRN: [id number]  Chief Complaint  Patient presents with  . Routine Post Op    POST OP DOS 2.1.22     Procedure: Right second digit amputation  65 y.o. male returns for post-op check.  Patient is doing well.  The incision has completely healed.  It took him a while to follow-up as he was lost to follow-up for a while.  His stitches are intact no acute complaints.  Review of Systems: Negative except as noted in the HPI. Denies N/V/F/Ch.  Past Medical History:  Diagnosis Date  . Acute respiratory failure with hypoxia ([place]) 06/23/2020  . Bronchitis 03/2018  . Community acquired pneumonia 06/23/2020  . COPD (chronic obstructive pulmonary disease) ([place])   . Dysphagia 07/31/2018  . Herpes   . laryngeal ca dx'd 06/2018  . Lung nodule 2020  . Severe sepsis ([place]) 06/23/2020  . Substance abuse ([place])    opiate addiction, been on methadone for 11 years    Current Outpatient Medications:  .  levofloxacin (LEVAQUIN) 500 MG tablet, Take 1 tablet (500 mg total) by mouth daily. For 10 days, Disp: 10 tablet, Rfl: 0 .  acetaminophen (TYLENOL) 160 MG/5ML liquid, Take 500 mg by mouth every 4 (four) hours as needed for fever., Disp: , Rfl:  .  albuterol (PROVENTIL HFA;VENTOLIN HFA) 108 (90 Base) MCG/ACT inhaler, Inhale 1-2 puffs into the lungs every 6 (six) hours as needed for wheezing. , Disp: , Rfl:  .  budesonide (PULMICORT) 0.5 MG/2ML nebulizer solution, INHALE CONTENTS OF 1 VIAL VIA NEBULIZER 2 TIMES DAILY (Patient taking differently: Take 0.5 mg by nebulization 2 (two) times daily.), Disp: 360 mL, Rfl: 5 .  methadone (DOLOPHINE) 10 MG/ML solution, Take 120 mg by mouth daily., Disp: , Rfl:  .  Nutritional Supplements (FEEDING SUPPLEMENT, KATE FARMS STANDARD 1.4,) LIQD liquid, Place 1,312 mLs into feeding tube as directed. -Run [employer] 1.4 @ 82 ml/hr over 16 hours (4 cartons or 1312 ml) -1800-1000, Disp: 40000 mL, Rfl: 1 .   Nutritional Supplements (FEEDING SUPPLEMENT, KATE FARMS STANDARD 1.4,) LIQD liquid, Place 325 mLs into feeding tube 3 (three) times daily between meals., Disp: 29250 mL, Rfl: 1 .  Nutritional Supplements (PROMOD) LIQD, Give 30 mL Promod twice daily via PEG. (Patient taking differently: Place 30 mLs into feeding tube 2 (two) times daily.), Disp: 2 Bottle, Rfl: 2 .  pantoprazole sodium (PROTONIX) 40 mg/20 mL PACK, Place 20 mLs (40 mg total) into feeding tube daily., Disp: 600 mL, Rfl: 2 .  thiamine 100 MG tablet, Place 1 tablet (100 mg total) into feeding tube daily., Disp: 30 tablet, Rfl: 0  Social History   Tobacco Use  Smoking Status Former Smoker  . Packs/day: 2.00  . Years: 50.00  . Pack years: 100.00  . Types: Cigarettes  Smokeless Tobacco Never Used  Tobacco Comment   07/06/18 at 10 cigs per day, quit 06/16/18    No Known Allergies Objective:  There were no vitals filed for this visit. There is no height or weight on file to calculate BMI. Constitutional Well developed. Well nourished.  Vascular Foot warm and well perfused. Capillary refill normal to all digits.   Neurologic Normal speech. Oriented to person, place, and time. Epicritic sensation to light touch grossly present bilaterally.  Dermatologic  right second digit amputation site has completely healed.  He has right hallux ulceration with fat layer exposed.  Does not probe down to bone  no bone exposure.  No malodor present.  Orthopedic:  No tenderness to palpation noted about the surgical site.   Radiographs: None Assessment:   1. 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 History of amputation of lesser toe of left foot ([place])    Plan:  Patient was evaluated and treated and all questions answered.  S/p foot surgery right -Progressing as expected post-operatively. -XR: None -WB Status: Weightbearing as tolerated in surgical shoe -Sutures: Removed.  Skin completely reepithelialized given  the length of follow-up. -Medications: None  Ulcer hallux ulceration with fat layer exposed -Debridement as below. -Dressed with Betadine, DSD. -Continue off-loading with surgical shoe. -Patient is a high risk of losing the hallux as well if the wound gets continuously worse.  I encouraged Betadine dressing at this time there is no exposure of bone.  He has some soft tissue coverage.  Patient states understanding  Procedure: Excisional Debridement of Wound Tool: Sharp chisel blade/tissue nipper Rationale: Removal of non-viable soft tissue from the wound to promote healing.  Anesthesia: none Pre-Debridement Wound Measurements: 5 cm x 2.5 cm x 0.5 cm  Post-Debridement Wound Measurements: 5.2 cm x 2.7 cm x 0.5 cm  Type of Debridement: Sharp Excisional Tissue Removed: Non-viable soft tissue Blood loss: Minimal (<50cc) Depth of Debridement: subcutaneous tissue. Technique: Sharp excisional debridement to bleeding, viable wound base.  Wound Progress: This is my initial evaluation.  I will continue monitor the progression of the wound. Site healing conversation 7 Dressing: Dry, sterile, compression dressing. Disposition: Patient tolerated procedure well. Patient to return in 1 week for follow-up.  No follow-ups on file.     No follow-ups on file.

## 2020-08-17 ENCOUNTER — Telehealth: Payer: Self-pay

## 2020-08-17 NOTE — Telephone Encounter (Signed)
[employer] called stating that they need a verbal order from Dr Livia Snellen for [facility].  Can call Nils Pyle at [phone] to give verbal order.

## 2020-08-17 NOTE — Telephone Encounter (Signed)
Okay for hospice care

## 2020-08-18 NOTE — Telephone Encounter (Signed)
Verbal given 

## 2020-08-18 NOTE — Telephone Encounter (Incomplete)
Please call Estill Bamberg @ [phone] to give verbal permission for hospice care. Can take from any nurse.

## 2020-08-25 ENCOUNTER — Inpatient Hospital Stay: Payer: 59 | Admitting: Pulmonary Disease

## 2020-09-02 ENCOUNTER — Encounter: Payer: 59 | Admitting: Podiatry

## 2020-09-20 DEATH — deceased

## 2020-10-16 ENCOUNTER — Ambulatory Visit: Payer: 59 | Admitting: Radiation Oncology

## 2020-11-11 ENCOUNTER — Ambulatory Visit: Payer: 59 | Admitting: Family Medicine

## 2021-05-20 ENCOUNTER — Encounter: Payer: Self-pay | Admitting: Hematology
